# Patient Record
Sex: Female | Born: 1955
Health system: Southern US, Community
[De-identification: ages and names within clinical notes are randomized; demographics above are authoritative.]

## PROBLEM LIST (undated history)

## (undated) DIAGNOSIS — K219 Gastro-esophageal reflux disease without esophagitis: Secondary | ICD-10-CM

## (undated) DIAGNOSIS — R160 Hepatomegaly, not elsewhere classified: Secondary | ICD-10-CM

## (undated) DIAGNOSIS — K5790 Diverticulosis of intestine, part unspecified, without perforation or abscess without bleeding: Secondary | ICD-10-CM

## (undated) DIAGNOSIS — K599 Functional intestinal disorder, unspecified: Secondary | ICD-10-CM

## (undated) DIAGNOSIS — K5730 Diverticulosis of large intestine without perforation or abscess without bleeding: Secondary | ICD-10-CM

## (undated) DIAGNOSIS — K5909 Other constipation: Secondary | ICD-10-CM

## (undated) DIAGNOSIS — S62109A Fracture of unspecified carpal bone, unspecified wrist, initial encounter for closed fracture: Secondary | ICD-10-CM

## (undated) DIAGNOSIS — E669 Obesity, unspecified: Secondary | ICD-10-CM

## (undated) DIAGNOSIS — K635 Polyp of colon: Secondary | ICD-10-CM

## (undated) DIAGNOSIS — K279 Peptic ulcer, site unspecified, unspecified as acute or chronic, without hemorrhage or perforation: Secondary | ICD-10-CM

## (undated) DIAGNOSIS — F32A Depression, unspecified: Secondary | ICD-10-CM

## (undated) DIAGNOSIS — R569 Unspecified convulsions: Secondary | ICD-10-CM

## (undated) DIAGNOSIS — F329 Major depressive disorder, single episode, unspecified: Secondary | ICD-10-CM

## (undated) DIAGNOSIS — H3530 Unspecified macular degeneration: Secondary | ICD-10-CM

## (undated) DIAGNOSIS — K7460 Unspecified cirrhosis of liver: Secondary | ICD-10-CM

## (undated) HISTORY — PX: FOOT SURGERY: SHX648

## (undated) HISTORY — DX: Functional intestinal disorder, unspecified: K59.9

## (undated) HISTORY — PX: GASTRIC BYPASS: SHX52

## (undated) HISTORY — DX: Unspecified macular degeneration: H35.30

## (undated) HISTORY — PX: TONSILLECTOMY: SUR1361

## (undated) HISTORY — DX: Diverticulosis of intestine, part unspecified, without perforation or abscess without bleeding: K57.90

## (undated) HISTORY — DX: Depression, unspecified: F32.A

## (undated) HISTORY — DX: Diverticulosis of large intestine without perforation or abscess without bleeding: K57.30

## (undated) HISTORY — DX: Polyp of colon: K63.5

## (undated) HISTORY — DX: Unspecified cirrhosis of liver: K74.60

## (undated) HISTORY — DX: Fracture of unspecified carpal bone, unspecified wrist, initial encounter for closed fracture: S62.109A

## (undated) HISTORY — DX: Peptic ulcer, site unspecified, unspecified as acute or chronic, without hemorrhage or perforation: K27.9

## (undated) HISTORY — DX: Major depressive disorder, single episode, unspecified: F32.9

## (undated) HISTORY — PX: ABDOMINAL HYSTERECTOMY: SHX81

## (undated) HISTORY — PX: REDUCTION MAMMAPLASTY: SUR839

## (undated) HISTORY — DX: Obesity, unspecified: E66.9

## (undated) HISTORY — DX: Other constipation: K59.09

## (undated) HISTORY — PX: OTHER SURGICAL HISTORY: SHX169

---

## 1986-09-05 DIAGNOSIS — K279 Peptic ulcer, site unspecified, unspecified as acute or chronic, without hemorrhage or perforation: Secondary | ICD-10-CM

## 1986-09-05 HISTORY — DX: Peptic ulcer, site unspecified, unspecified as acute or chronic, without hemorrhage or perforation: K27.9

## 1998-11-03 ENCOUNTER — Encounter: Payer: Self-pay | Admitting: Neurosurgery

## 1998-11-05 ENCOUNTER — Encounter: Payer: Self-pay | Admitting: Neurosurgery

## 1998-11-05 ENCOUNTER — Inpatient Hospital Stay (HOSPITAL_COMMUNITY): Admission: RE | Admit: 1998-11-05 | Discharge: 1998-11-06 | Payer: Self-pay | Admitting: Neurosurgery

## 1999-01-04 ENCOUNTER — Inpatient Hospital Stay (HOSPITAL_COMMUNITY): Admission: AD | Admit: 1999-01-04 | Discharge: 1999-01-08 | Payer: Self-pay | Admitting: Psychiatry

## 1999-02-07 ENCOUNTER — Inpatient Hospital Stay (HOSPITAL_COMMUNITY): Admission: EM | Admit: 1999-02-07 | Discharge: 1999-02-10 | Payer: Self-pay | Admitting: Psychiatry

## 2000-11-29 ENCOUNTER — Encounter: Admission: RE | Admit: 2000-11-29 | Discharge: 2000-11-29 | Payer: Self-pay | Admitting: *Deleted

## 2000-12-14 ENCOUNTER — Encounter (HOSPITAL_COMMUNITY): Admission: RE | Admit: 2000-12-14 | Discharge: 2001-01-13 | Payer: Self-pay | Admitting: Pulmonary Disease

## 2001-01-18 ENCOUNTER — Encounter (HOSPITAL_COMMUNITY): Admission: RE | Admit: 2001-01-18 | Discharge: 2001-02-17 | Payer: Self-pay | Admitting: *Deleted

## 2001-02-19 ENCOUNTER — Encounter (HOSPITAL_COMMUNITY): Admission: RE | Admit: 2001-02-19 | Discharge: 2001-03-21 | Payer: Self-pay | Admitting: *Deleted

## 2001-04-02 ENCOUNTER — Ambulatory Visit (HOSPITAL_COMMUNITY): Admission: RE | Admit: 2001-04-02 | Discharge: 2001-04-02 | Payer: Self-pay | Admitting: Pulmonary Disease

## 2001-06-26 ENCOUNTER — Ambulatory Visit: Admission: RE | Admit: 2001-06-26 | Discharge: 2001-06-27 | Payer: Self-pay | Admitting: Pulmonary Disease

## 2001-11-09 ENCOUNTER — Emergency Department (HOSPITAL_COMMUNITY): Admission: EM | Admit: 2001-11-09 | Discharge: 2001-11-09 | Payer: Self-pay | Admitting: Emergency Medicine

## 2001-12-13 ENCOUNTER — Ambulatory Visit (HOSPITAL_COMMUNITY): Admission: RE | Admit: 2001-12-13 | Discharge: 2001-12-13 | Payer: Self-pay | Admitting: Cardiology

## 2002-01-30 ENCOUNTER — Inpatient Hospital Stay (HOSPITAL_COMMUNITY): Admission: EM | Admit: 2002-01-30 | Discharge: 2002-02-08 | Payer: Self-pay | Admitting: *Deleted

## 2002-03-15 ENCOUNTER — Inpatient Hospital Stay (HOSPITAL_COMMUNITY): Admission: EM | Admit: 2002-03-15 | Discharge: 2002-03-29 | Payer: Self-pay | Admitting: Psychiatry

## 2002-03-19 ENCOUNTER — Encounter: Payer: Self-pay | Admitting: Psychiatry

## 2002-03-31 ENCOUNTER — Encounter: Payer: Self-pay | Admitting: *Deleted

## 2002-04-01 ENCOUNTER — Inpatient Hospital Stay (HOSPITAL_COMMUNITY): Admission: EM | Admit: 2002-04-01 | Discharge: 2002-04-10 | Payer: Self-pay | Admitting: Psychiatry

## 2002-12-10 ENCOUNTER — Ambulatory Visit (HOSPITAL_COMMUNITY): Admission: RE | Admit: 2002-12-10 | Discharge: 2002-12-10 | Payer: Self-pay | Admitting: Podiatry

## 2008-01-17 ENCOUNTER — Ambulatory Visit: Payer: Self-pay | Admitting: Internal Medicine

## 2008-01-31 ENCOUNTER — Ambulatory Visit (HOSPITAL_COMMUNITY): Admission: RE | Admit: 2008-01-31 | Discharge: 2008-01-31 | Payer: Self-pay | Admitting: Internal Medicine

## 2008-01-31 ENCOUNTER — Ambulatory Visit: Payer: Self-pay | Admitting: Internal Medicine

## 2008-01-31 ENCOUNTER — Encounter: Payer: Self-pay | Admitting: Internal Medicine

## 2008-01-31 DIAGNOSIS — K5790 Diverticulosis of intestine, part unspecified, without perforation or abscess without bleeding: Secondary | ICD-10-CM

## 2008-01-31 HISTORY — PX: COLONOSCOPY: SHX174

## 2008-01-31 HISTORY — DX: Diverticulosis of intestine, part unspecified, without perforation or abscess without bleeding: K57.90

## 2008-02-27 ENCOUNTER — Ambulatory Visit: Payer: Self-pay | Admitting: Internal Medicine

## 2008-04-08 DIAGNOSIS — K5900 Constipation, unspecified: Secondary | ICD-10-CM | POA: Insufficient documentation

## 2008-04-08 DIAGNOSIS — F319 Bipolar disorder, unspecified: Secondary | ICD-10-CM | POA: Insufficient documentation

## 2008-04-08 DIAGNOSIS — F329 Major depressive disorder, single episode, unspecified: Secondary | ICD-10-CM | POA: Insufficient documentation

## 2008-04-08 DIAGNOSIS — Z87898 Personal history of other specified conditions: Secondary | ICD-10-CM | POA: Insufficient documentation

## 2008-04-08 DIAGNOSIS — K279 Peptic ulcer, site unspecified, unspecified as acute or chronic, without hemorrhage or perforation: Secondary | ICD-10-CM | POA: Insufficient documentation

## 2008-04-08 DIAGNOSIS — K5730 Diverticulosis of large intestine without perforation or abscess without bleeding: Secondary | ICD-10-CM | POA: Insufficient documentation

## 2008-04-17 ENCOUNTER — Ambulatory Visit: Payer: Self-pay | Admitting: Internal Medicine

## 2009-05-01 ENCOUNTER — Encounter: Payer: Self-pay | Admitting: Urgent Care

## 2009-05-20 ENCOUNTER — Ambulatory Visit: Payer: Self-pay | Admitting: Internal Medicine

## 2010-05-20 ENCOUNTER — Ambulatory Visit: Payer: Self-pay | Admitting: Internal Medicine

## 2010-05-20 ENCOUNTER — Telehealth (INDEPENDENT_AMBULATORY_CARE_PROVIDER_SITE_OTHER): Payer: Self-pay

## 2010-06-04 ENCOUNTER — Emergency Department (HOSPITAL_COMMUNITY): Admission: EM | Admit: 2010-06-04 | Discharge: 2010-06-04 | Payer: Self-pay | Admitting: Emergency Medicine

## 2010-06-14 ENCOUNTER — Encounter (INDEPENDENT_AMBULATORY_CARE_PROVIDER_SITE_OTHER): Payer: Self-pay | Admitting: *Deleted

## 2010-07-31 ENCOUNTER — Inpatient Hospital Stay (HOSPITAL_COMMUNITY)
Admission: EM | Admit: 2010-07-31 | Discharge: 2010-08-09 | Payer: Self-pay | Source: Home / Self Care | Admitting: Emergency Medicine

## 2010-08-04 ENCOUNTER — Ambulatory Visit: Payer: Self-pay | Admitting: Gastroenterology

## 2010-08-05 HISTORY — PX: ERCP W/ SPHINCTEROTOMY AND BALLOON DILATION: SHX1524

## 2010-08-05 HISTORY — PX: CHOLECYSTECTOMY: SHX55

## 2010-08-06 ENCOUNTER — Encounter (INDEPENDENT_AMBULATORY_CARE_PROVIDER_SITE_OTHER): Payer: Self-pay | Admitting: Pulmonary Disease

## 2010-09-20 ENCOUNTER — Encounter (INDEPENDENT_AMBULATORY_CARE_PROVIDER_SITE_OTHER): Payer: Self-pay | Admitting: *Deleted

## 2010-10-07 NOTE — Medication Information (Signed)
Summary: Tax adviser   Imported By: Diana Eves 05/01/2009 16:07:54  _____________________________________________________________________  External Attachment:    Type:   Image     Comment:   External Document  Appended Document: RX FolderAMITIZA    Prescriptions: AMITIZA 24 MCG CAPS (LUBIPROSTONE) one by mouth two times a day for constipation  #60 x 5   Entered and Authorized by:   Joselyn Arrow FNP-BC   Signed by:   Joselyn Arrow FNP-BC on 05/04/2009   Method used:   Electronically to        [facility] (retail)       [address]       [place], [place]  [address]       Ph: [phone]       Fax: [phone]   RxID:   [id number]

## 2010-10-07 NOTE — Assessment & Plan Note (Signed)
Summary: YEAR FU/CONSTIPATION/SS   Visit Type:  Follow-up Visit Primary Care Provider:  Juanetta Gosling  Chief Complaint:  F/U constipation.  History of Present Illness: Followup chronic constipation. One year followup. Patient doing very well on Amitiza. Having one to 2 bowel movements daily. No headache. No nausea. She denies any intercurrent illnesses since she was last seen here. She'll be due for average risk screening colonoscopy 2019.  Weight down 4 pounds since she was here one year ago.   Current Medications (verified): 1)  Wellbutrin .... Once Daily 2)  Trazodone .... Once Daily 3)  Risperidone .... Once Daily 4)  Amitiza 24 Mcg Caps (Lubiprostone) .... One By Mouth Two Times A Day For Constipation 5)  Prozac .... Take 1 Tablet By Mouth Once A Day  Allergies (verified): 1)  ! Cipro  Past History:  Past Medical History: Last updated: 2009-06-18 Depression Bipolar Chronic constipation Diverticulosis Morbid obesity  Past Surgical History: Last updated: 04/08/2008 Tonsillectomy Hysterectomy Gastric Bypass with Revision Wrist Surgery  Family History: Last updated: 06-18-09 Father: Deceased age 28    CVA Mother: Living age 22      Healthy Siblings: one brother    healthy  Social History: Last updated: 06/18/2010 Marital Status: divorced Children: Two Occupation: Disabled Smoked for 30 years one pack per day  Quit x 5 years Alcohol   quit x 9 years No daily exercise Patient is a former smoker.   Risk Factors: Smoking Status: quit (06-18-2009)  Social History: Marital Status: divorced Children: Two Occupation: Disabled Smoked for 30 years one pack per day  Quit x 5 years Alcohol   quit x 9 years No daily exercise Patient is a former smoker.   Vital Signs:  Patient profile:   55 year old female Height:      64 inches Weight:      240.50 pounds BMI:     41.43 Temp:     97.9 degrees F oral Pulse rate:   80 / minute BP sitting:   120 / 90  (left  arm) Cuff size:   large  Vitals Entered By: Cloria Spring LPN (06/18/10 8:29 AM)  Physical Exam  General:  pleasant lady appears in no acute distress. She has a very flat affect. Lungs:  clear to auscultation Heart:  regular rate rhythm without murmur gallop rub Abdomen:  obese. Positive bowel sounds soft nontender without obvious mass or organomegaly  Impression & Recommendations: Impression: Pleasant 55 year old lady with chronic constipation. She's had a fantastic response to Amitizat. She's doing very well on this medication without any apparent side effects.  Recommendations: Continue Amitiza 24 micrograms twice daily during meals. Unless something comes up, will plan to see this nice lady back in one year. Screening colonoscopy 2019.  Appended Document: Orders Update    Clinical Lists Changes  Problems: Added new problem of CONSTIPATION (ICD-564.00) Orders: Added new Service order of Est. Patient Level III (81191) - Signed

## 2010-10-07 NOTE — Miscellaneous (Signed)
Summary: CONSULTATION  Clinical Lists Changes NAME:  [name], [name]             ACCOUNT NO.:  [id number]      MEDICAL RECORD NO.:  [id number]          PATIENT TYPE:  INP      LOCATION:  A316                          FACILITY:  [facility]      PHYSICIAN:  Jonette Eva, M.D.     DATE OF BIRTH:  Aug 09, 1956      DATE OF CONSULTATION:   DATE OF DISCHARGE:                                    CONSULTATION         REASON FOR CONSULTATION:  Nausea, vomiting, and epigastric pain.      HISTORY OF PRESENT ILLNESS:  Ms. [name] is a 55 year old female who   presented to the emergency room with the complaints of nausea, vomiting,   and epigastric pain.  She states this started on Friday of last week.   She reports the epigastric pain is constant, 10/10 when it started,   described as burning and aching.  She also had several episodes of   vomiting and nausea.  She presently states that she only now has   epigastric pain and nausea with eating.  Otherwise she denies pain.  She   denies any vomiting recently.  She does have a history of gastric bypass   in the 100s in [place].  She reports this as a Roux-en-Y, however, she did   undergo some type of possible revision/reversal of her Roux-en-Y due to   a possible perforated ulcer.  This is all per the patient's report.  We   do not have any of these records, so unsure exactly what happened.  She   does state that they oversewed the ulcer, however, as mentioned it is   unclear if this gastric bypass was reversed or just revised. She states   that she normally has a bowel movement every day.  She is on Amitiza at   home, she believes it is 24 mcg.  She has not had a bowel movement since   being admitted, as she has not been on Amitiza.  She avoids aspirin   products, Aleve, and Goody's Powder.      PAST MEDICAL HISTORY:  Significant for depression and constipation.      PAST SURGICAL HISTORY:  Roux-en-Y gastric bypass in [place] in 1983.  Her   preop weight  was 235.  She lost down to 108, however, started to regain   after repair of the ulcer that was done a few years later.  Additional   surgery:  Some type of reparative surgery after she had a perforated   ulcer.  As mentioned before, it is unsure if this was a reversal or just   an oversew of the ulcer.  Hysterectomy, tonsillectomy, breast   augmentation, and wrist surgery.      MEDICATIONS PRIOR TO ADMISSION:  Trazodone, Wellbutrin, Prozac,   Risperdal, and Amitiza.  She believes it might be 24 mcg.      CURRENT MEDICATIONS FOR THE HOSPITAL ADMISSION:  Wellbutrin, Lovenox,   Prozac, Amitiza, Protonix IV, risperidone, trazodone, morphine,   nitroglycerin as needed,  and Zofran 4 mg as needed for nausea.      SOCIAL HISTORY:  She is single and divorced.  She lives with her mom.   She has 2 adult children.  She does admit to alcohol abuse in the past,   drinking approximately a 12-pack daily.  She started drinking when she   was 11 years and just quit 9 years ago.  She has been abstinent ever   since.  She did smoke up until 5 years ago.  She denies any illicit drug   abuse.      FAMILY HISTORY:  Her mom hyperparathyroidism, father diabetes.  She does   have an uncle who has a history of colon cancer.  Denies any other GI or   liver problems in her family.      REVIEW OF SYSTEMS:  Negative except as mentioned in the HPI.      PHYSICAL EXAMINATION:  GENERAL:  She has a flat affect.  She is alert   and oriented x3.   VITAL SIGNS:  BP 113/70, pulse 52, respirations 20, temp 98.2.   LUNGS:  Clear to auscultation bilaterally.   CARDIOVASCULAR:  First and second heart sounds are appreciated.  No   murmurs, rubs, or gallops.   ABDOMEN:  Soft, but obese.  It is nondistended.  It is tender to   palpation mildly in the left upper quadrant and epigastric region.  She   does have bowel sounds.  Unable to appreciate any mass or splenomegaly.   EXTREMITIES:  Without edema.      PERTINENT  LABORATORY DATA FROM THIS ADMISSION:  Include a CBC on   July 31, 2010:  White blood cell count 14.8, hemoglobin and   hematocrit 13.9 and 40.3.  Sodium 141, potassium 4.2, glucose 148, BUN   11, creatinine 1.17.  Total bilirubin 0.4, alk phos was normal at 86,   AST and ALT both 21--these are normal, total protein 7.4, albumin 4.1,   calcium 9.6, lipase 30, which is normal.  Cardiac markers appear to be   normal.      RADIOLOGY:  Acute abdominal series done on July 31, 2010, showed   postsurgical changes of the abdomen, nonspecific bowel gas pattern,   possible atelectasis or scarring of the left lung base.  Ultrasound of   the abdomen done August 03, 2010, secondary to abdominal pain, nausea,   and vomiting, impression was cholelithiasis, mild gallbladder wall   thickening noted, negative Murphy sign, fatty liver.  There was a 3-cm   slightly complex cystic area to the pancreatic head and left lobe of the   liver.  A CT with pancreatic protocol was then performed, which showed   probable lymph node in the gastrohepatic ligament corresponding to the   mass that was seen on the ultrasound.  They recommended a followup in 3   months.  There was another prominent lymph node in the portacaval space   that was 2.4 x 1.4 cm.  Pancreas appeared normal without inflammatory   changes.  There were several stones in the gallbladder with mild wall   thickening. Stones were seen in the common bile duct measuring 7 mm and   6 mm.  There was mild low attenuation seen in the hepatic parenchyma,   which could possibly be seen with cirrhosis, nonspecific nodularity.      IMPRESSION:  Ms. [name] is a 55 year old Caucasian female with a past   surgical history significant for a gastric  bypass, as well as some sort   of possible reversal of the gastric bypass.  It is unclear per her   history.  This was secondary to complications of an ulcer that developed   a few years after the gastric bypass.   She presents with nausea,   vomiting, and epigastric pain, as well as left upper quadrant pain.  CT   performed does show cholelithiasis, as well as some concerning lymph   nodes that will need to be evaluated by possible biopsy per   Interventional Radiology.  She will also need an ERCP (endoscopic   retrograde cholangiopancreatography), however, it is unclear secondary   to her anatomy if this would actually be able to be performed.  She will   most likely have to be referred to another facility to do this.  This CT   will be reviewed with the radiologist by Dr. Darrick Penna to discuss ERCP, as   well as the lymph node status.  There is concern for possible cirrhosis   that was seen on the CT scan, however, a CMP was drawn on July 31, 2010, which has normal results without any elevation of LFTs.      PLAN:   1. Will discuss CT results with the radiologist to determine the steps       for the possible lymph node biopsy, as well as the route we need to       take as far as managing the choledocholithiasis.  Ultimately she       will have to have her gallbladder removed as expressed by General       Surgery as well.   2. We will change her Protonix to p.o.   3. Will restart Amitiza.   4. LFTs.   5. We will continue to follow the patient and follow up on the plan       for the lymph node biopsies and possible ERCP at an outlying       facility.  In the meantime, continue supportive measures.      We would like to thank Dr. Juanetta Gosling for the referral of this nice lady.            ______________________________   Gerrit Halls, ANP-BC      ______________________________   Jonette Eva, M.D.            AS/MEDQ  D:  08/04/2010  T:  08/04/2010  Job:  [id number]      Electronically Signed by Gerrit Halls  on 08/11/2010 11:50:10 AM   Electronically Signed by Jonette Eva M.D. on 09/16/2010 07:10:22 PM

## 2010-10-07 NOTE — Assessment & Plan Note (Signed)
Summary: FU OV 1 YR,CONSTIPATION/AMS   Visit Type:  Follow-up Visit Primary Care [name]:  [name]  Chief Complaint:  F/U constipation.  History of Present Illness:  Followup chronic constipation. This patient has done well on Amitiza 24 micrograms twice daily. She has one to 2 bowel movements on this regimen; otherwise currently is having no GI symptoms. Colonoscopy one year ago demonstrated only a hyperplastic polyp. No family history of first-degree relatives with polyps or colon cancer. She is due for routine screening 2019.   Preventive Screening-Counseling & Management  Alcohol-Tobacco     Smoking Status: quit  Current Medications (verified): 1)  Wellbutrin .... Once Daily 2)  Trazodone .... Once Daily 3)  Risperidone .... Once Daily 4)  Amitiza 24 Mcg Caps (Lubiprostone) .... One By Mouth Two Times A Day For Constipation 5)  Prozac .... Take 1 Tablet By Mouth Once A Day  Allergies (verified): 1)  ! Cipro  Past History:  Past Surgical History: Last updated: 04/08/2008 Tonsillectomy Hysterectomy Gastric Bypass with Revision Wrist Surgery  Past Medical History: Depression Bipolar Chronic constipation Diverticulosis Morbid obesity  Family History: Reviewed history and no changes required. Father: Deceased age 58    CVA Mother: Living age 28      Healthy Siblings: one brother    healthy  Social History: Marital Status: divorced Children: Two Occupation: Disabled Smoked for 30 years one pack per day  Quit x 4 years Alcohol   quit x 8 years No daily exercise Patient is a former smoker.  Smoking Status:  quit  Vital Signs:  Patient profile:   55 year old female Height:      64 inches Weight:      244.50 pounds BMI:     42.12 Temp:     97.8 degrees F oral Pulse rate:   88 / minute BP sitting:   126 / 90  (left arm) Cuff size:   large  Vitals Entered By: Cloria Spring LPN (May 20, 2009 9:48 AM)  Physical Exam  General:  flat affect but no  acute distress Eyes:  no scleral icterus conjunctiva are pink Lungs:  clear to auscultation Heart:  regular rate rhythm without murmur gallop rub Abdomen:  obese positive bowel sounds soft nontender without appreciable mass or organomegaly  Impression & Recommendations: Chronic constipation well controlled now  on Amitiza described above.  Recommendations: Continue Amitiza 24 micrograms twice daily during meals warning and about potential side effects. Unless something comes up and seems nicely back in one year. Plan for screening colonoscopy 2019  Appended Document: Orders Update-charge    Clinical Lists Changes  Orders: Added new Service order of Est. Patient Level III (29562) - Signed

## 2010-10-07 NOTE — Medication Information (Signed)
Summary: AMITIZA  AMITIZA   Imported By: Rexene Alberts 06/14/2010 10:13:23  _____________________________________________________________________  External Attachment:    Type:   Image     Comment:   External Document  Appended Document: AMITIZA called [facility]- Rx from 05/31/2010 was put on file. pt has 1 year RF on [place].

## 2010-10-07 NOTE — Progress Notes (Signed)
Summary: Needs Rx for Amitiza/ forgot to get at [facility]  Phone Note Call from Patient   Caller: Patient Summary of Call: VM from pt, she forgot to get Rx for Amitiza when she was here today. Initial call taken by: Cloria Spring LPN,  May 20, 2010 4:51 PM     Appended Document: Needs Rx for Amitiza/ forgot to get at [facility] ok; amitiza 24 micrograms gelcap one two times a day during meals disp #60 w one year refill  Appended Document: Needs Rx for Amitiza/ forgot to get at [facility] rx called to [facility]

## 2010-11-15 LAB — HEPATIC FUNCTION PANEL
ALT: 58 U/L — ABNORMAL HIGH (ref 0–35)
AST: 35 U/L (ref 0–37)
Albumin: 2.6 g/dL — ABNORMAL LOW (ref 3.5–5.2)
Alkaline Phosphatase: 81 U/L (ref 39–117)
Bilirubin, Direct: 0.3 mg/dL (ref 0.0–0.3)
Indirect Bilirubin: 0.6 mg/dL (ref 0.3–0.9)
Total Bilirubin: 0.9 mg/dL (ref 0.3–1.2)
Total Protein: 5.2 g/dL — ABNORMAL LOW (ref 6.0–8.3)

## 2010-11-15 LAB — CBC
HCT: 34 % — ABNORMAL LOW (ref 36.0–46.0)
Hemoglobin: 11.4 g/dL — ABNORMAL LOW (ref 12.0–15.0)
MCH: 30.1 pg (ref 26.0–34.0)
MCHC: 33.5 g/dL (ref 30.0–36.0)
MCV: 89.7 fL (ref 78.0–100.0)
Platelets: 340 10*3/uL (ref 150–400)
RBC: 3.79 MIL/uL — ABNORMAL LOW (ref 3.87–5.11)
RDW: 13.5 % (ref 11.5–15.5)
WBC: 13.9 10*3/uL — ABNORMAL HIGH (ref 4.0–10.5)

## 2010-11-15 LAB — BASIC METABOLIC PANEL
BUN: 4 mg/dL — ABNORMAL LOW (ref 6–23)
CO2: 27 mEq/L (ref 19–32)
Calcium: 8 mg/dL — ABNORMAL LOW (ref 8.4–10.5)
Chloride: 102 mEq/L (ref 96–112)
Creatinine, Ser: 1.04 mg/dL (ref 0.4–1.2)
GFR calc Af Amer: >60 mL/min (ref >60)
GFR calc non Af Amer: 55 mL/min — ABNORMAL LOW (ref >60)
Glucose, Bld: 121 mg/dL — ABNORMAL HIGH (ref 70–99)
Potassium: 3 mEq/L — ABNORMAL LOW (ref 3.5–5.1)
Sodium: 138 mEq/L (ref 135–145)

## 2010-11-15 LAB — DIFFERENTIAL
Basophils Absolute: 0 10*3/uL (ref 0.0–0.1)
Basophils Relative: 0 % (ref 0–1)
Eosinophils Absolute: 0 10*3/uL (ref 0.0–0.7)
Eosinophils Relative: 0 % (ref 0–5)
Lymphocytes Relative: 12 % (ref 12–46)
Lymphs Abs: 1.7 10*3/uL (ref 0.7–4.0)
Monocytes Absolute: 1.3 10*3/uL — ABNORMAL HIGH (ref 0.1–1.0)
Monocytes Relative: 9 % (ref 3–12)
Neutro Abs: 10.9 10*3/uL — ABNORMAL HIGH (ref 1.7–7.7)
Neutrophils Relative %: 79 % — ABNORMAL HIGH (ref 43–77)

## 2010-11-15 LAB — GLUCOSE, CAPILLARY: Glucose-Capillary: 124 mg/dL — ABNORMAL HIGH (ref 70–99)

## 2010-11-16 LAB — CARDIAC PANEL(CRET KIN+CKTOT+MB+TROPI)
CK, MB: 1.1 ng/mL (ref 0.3–4.0)
CK, MB: 1.1 ng/mL (ref 0.3–4.0)
Relative Index: INVALID (ref 0.0–2.5)
Relative Index: INVALID (ref 0.0–2.5)
Total CK: 65 U/L (ref 7–177)
Total CK: 93 U/L (ref 7–177)
Troponin I: 0.02 ng/mL (ref 0.00–0.06)
Troponin I: <0.01 ng/mL (ref 0.00–0.06)

## 2010-11-16 LAB — URINALYSIS, ROUTINE W REFLEX MICROSCOPIC
Bilirubin Urine: NEGATIVE
Glucose, UA: NEGATIVE mg/dL
Ketones, ur: NEGATIVE mg/dL
Leukocytes, UA: NEGATIVE
Nitrite: NEGATIVE
Protein, ur: NEGATIVE mg/dL
Specific Gravity, Urine: >1.03 — ABNORMAL HIGH (ref 1.005–1.030)
Urobilinogen, UA: 0.2 mg/dL (ref 0.0–1.0)
pH: 5.5 (ref 5.0–8.0)

## 2010-11-16 LAB — CBC
HCT: 40.3 % (ref 36.0–46.0)
Hemoglobin: 13.9 g/dL (ref 12.0–15.0)
MCH: 30.5 pg (ref 26.0–34.0)
MCHC: 34.5 g/dL (ref 30.0–36.0)
MCV: 88.4 fL (ref 78.0–100.0)
Platelets: 333 10*3/uL (ref 150–400)
RBC: 4.56 MIL/uL (ref 3.87–5.11)
RDW: 12.8 % (ref 11.5–15.5)
WBC: 14.8 10*3/uL — ABNORMAL HIGH (ref 4.0–10.5)

## 2010-11-16 LAB — URINE MICROSCOPIC-ADD ON

## 2010-11-16 LAB — URINE CULTURE
Colony Count: NO GROWTH
Culture  Setup Time: 201111261917
Culture: NO GROWTH

## 2010-11-16 LAB — POCT CARDIAC MARKERS
CKMB, poc: 1.3 ng/mL (ref 1.0–8.0)
CKMB, poc: 1.6 ng/mL (ref 1.0–8.0)
Myoglobin, poc: 92.8 ng/mL (ref 12–200)
Myoglobin, poc: 95.7 ng/mL (ref 12–200)
Troponin i, poc: <0.05 ng/mL (ref 0.00–0.09)
Troponin i, poc: <0.05 ng/mL (ref 0.00–0.09)

## 2010-11-16 LAB — PROTIME-INR
INR: 1 (ref 0.00–1.49)
Prothrombin Time: 13.4 seconds (ref 11.6–15.2)

## 2010-11-16 LAB — DIFFERENTIAL
Basophils Absolute: 0 10*3/uL (ref 0.0–0.1)
Basophils Relative: 0 % (ref 0–1)
Eosinophils Absolute: 0 10*3/uL (ref 0.0–0.7)
Eosinophils Relative: 0 % (ref 0–5)
Lymphocytes Relative: 5 % — ABNORMAL LOW (ref 12–46)
Lymphs Abs: 0.8 10*3/uL (ref 0.7–4.0)
Monocytes Absolute: 0.6 10*3/uL (ref 0.1–1.0)
Monocytes Relative: 4 % (ref 3–12)
Neutro Abs: 13.4 10*3/uL — ABNORMAL HIGH (ref 1.7–7.7)
Neutrophils Relative %: 91 % — ABNORMAL HIGH (ref 43–77)

## 2010-11-16 LAB — COMPREHENSIVE METABOLIC PANEL
ALT: 21 U/L (ref 0–35)
AST: 21 U/L (ref 0–37)
Albumin: 4.1 g/dL (ref 3.5–5.2)
Alkaline Phosphatase: 86 U/L (ref 39–117)
BUN: 11 mg/dL (ref 6–23)
CO2: 25 mEq/L (ref 19–32)
Calcium: 9.6 mg/dL (ref 8.4–10.5)
Chloride: 106 mEq/L (ref 96–112)
Creatinine, Ser: 1.17 mg/dL (ref 0.4–1.2)
GFR calc Af Amer: 58 mL/min — ABNORMAL LOW (ref >60)
GFR calc non Af Amer: 48 mL/min — ABNORMAL LOW (ref >60)
Glucose, Bld: 148 mg/dL — ABNORMAL HIGH (ref 70–99)
Potassium: 4.2 mEq/L (ref 3.5–5.1)
Sodium: 141 mEq/L (ref 135–145)
Total Bilirubin: 0.4 mg/dL (ref 0.3–1.2)
Total Protein: 7.4 g/dL (ref 6.0–8.3)

## 2010-11-16 LAB — HEPATIC FUNCTION PANEL
ALT: 80 U/L — ABNORMAL HIGH (ref 0–35)
AST: 23 U/L (ref 0–37)
Albumin: 2.7 g/dL — ABNORMAL LOW (ref 3.5–5.2)
Alkaline Phosphatase: 124 U/L — ABNORMAL HIGH (ref 39–117)
Bilirubin, Direct: 0.5 mg/dL — ABNORMAL HIGH (ref 0.0–0.3)
Indirect Bilirubin: 0.5 mg/dL (ref 0.3–0.9)
Total Bilirubin: 1 mg/dL (ref 0.3–1.2)
Total Protein: 5.6 g/dL — ABNORMAL LOW (ref 6.0–8.3)

## 2010-11-16 LAB — APTT: aPTT: 24 seconds (ref 24–37)

## 2010-11-16 LAB — LIPASE, BLOOD: Lipase: 30 U/L (ref 11–59)

## 2011-01-18 NOTE — Assessment & Plan Note (Signed)
NAME:  [name], [name]              CHART#:  [id number]   DATE:  04/17/2008                       DOB:  Aug 27, 1956   Followup chronic constipation, obstipation, hyperplastic polyp on  colonoscopy 01/31/2008, history of diverticulosis, and other problems as  chronicled in 02/27/2008 office note.  The patient is doing well.  She  is having two bowel movements daily with Amitiza 24 mcg b.i.d. with  breakfast and supper.  No headache, diarrhea, or nausea.  Overall, she  is very happy with bowel frequency.  She is no longer taking MiraLax as  it really did not add anything to efficacy of Amitiza.  She feels well.  Calcium and TSH came back normal at 9.8 and 2.595 respectively.   CURRENT MEDICATIONS:  See updated list.   ALLERGIES:  No known drug allergies.   PHYSICAL EXAMINATION:  GENERAL:  She appears at her baseline.  VITAL SIGNS:  Weight 241, height 5 feet 4 inches, temp 91, BP 110/68,  and pulse 60.  SKIN:  Warm and dry.  ABDOMEN:  Obese.  Positive bowel sounds.  Soft and nontender without  appreciable mass or hepatosplenomegaly.   ASSESSMENT:  Constipation, now markedly improved with Amitiza.  She is  tolerating this agent very well.   RECOMMENDATIONS:  Would continue Amitiza 24 mcg b.i.d. with meals as  described above.  Watch for side effects (she is doing well at this  point, she is likely not to have any in the future).  Unless something  comes up, I plan to see this nice lady back in 1 year.  Amitiza  prescription refill given.       Jonathon Bellows, M.D.  Electronically Signed     RMR/MEDQ  D:  04/17/2008  T:  04/18/2008  Job:  [id number]   cc:   Ramon Dredge L. Juanetta Gosling, M.D.

## 2011-01-18 NOTE — Op Note (Signed)
NAME:  [name], [name]             ACCOUNT NO.:  [id number]   MEDICAL RECORD NO.:  [id number]          PATIENT TYPE:  AMB   LOCATION:  DAY                           FACILITY:  [facility]   PHYSICIAN:  R. Roetta Sessions, M.D. DATE OF BIRTH:  10/25/1955   DATE OF PROCEDURE:  01/31/2008  DATE OF DISCHARGE:                               OPERATIVE REPORT   INDICATIONS FOR PROCEDURE:  This is a 55 year old lady, chronic  constipation, positive family history of colon cancer in secondary  relative.  Last colonoscopy is back in the mid 80s for reasons which  were not clear.  Colonoscopy is now being done.  This approach has been  discussed with the patient at length.  Potential risks, benefits, and  alternatives have been reviewed.  Questions answered.  She is agreeable.  Please see documentation of medical record.   PROCEDURE NOTE:  O2 saturation, blood pressure, pulse, and respirations  were monitored throughout the entire procedure.  Conscious sedation  Versed 4 mg IV, Demerol 100 mg IV in divided doses.   INSTRUMENT:  Pentax video chip system.   FINDINGS:  Digital rectal exam revealed no abnormalities.   ENDOSCOPIC FINDINGS:  Prep was adequate.  Colon:  Colonic mucosa was  surveyed from the rectosigmoid junction through the left transverse,  right colon, the appendiceal orifice, ileocecal valve, and cecum.  These  structures were well seen and photographed for record.  From this level,  scope was slowly cautiously withdrawn.  All previously mentioned mucosal  surfaces were again seen.  The patient had an elongated tortuous colon.  She had scattered left-sided diverticula.  She had three diminutive  polyps clustered at 30 cm which were cold biopsied/removed.  The  remainder of colonic mucosa appeared unremarkable.  Scope was pulled  down the rectum.  The rectal mucosa including retroflex view of anal  verge demonstrated no abnormalities.  The patient tolerated the  procedure well.  It was  reactive endoscopy.   IMPRESSION:  Normal rectum, left-sided diverticula.  Long tortuous  colon, diminutive polyps at 30 cm removed as described above, otherwise  colonic mucosa appeared unremarkable.   Suspect the patient has slow transit constipation (colonic inertia).   RECOMMENDATIONS:  1. We will follow up on path.  Her polyps were removed today.  2. Begin MiraLax 17 g orally at bedtime.  3. We will plan to see her back in one month and assess her progress      and go from there.  She was given literature of constipation and      diverticulosis.      Jonathon Bellows, M.D.  Electronically Signed     RMR/MEDQ  D:  01/31/2008  T:  01/31/2008  Job:  [id number]   cc:   Ramon Dredge L. Juanetta Gosling, M.D.  Fax: [phone]

## 2011-01-18 NOTE — Assessment & Plan Note (Signed)
NAME:  GALADRIEL, SHROFF              CHART#:  [id number]   DATE:  02/27/2008                       DOB:  02/19/1956   PRIMARY CARE PHYSICIAN:  Oneal Deputy. Juanetta Gosling, MD   CHIEF COMPLAINT:  1. Chronic constipation and obstipation.  2. Hyperplastic polyp on colonoscopy 01/31/2008 by Dr. Jena Gauss.  3. Diverticulosis.  4. Morbid obesity.  5. Status post gastric bypass and revision with history of peptic      ulcer disease.  6. Status post hysterectomy.  7. Bipolar disorder.  8. Status post tonsillectomy.  9. Status post wrist surgery.   SUBJECTIVE:  The patient is a 55 year old female with a history of  chronic constipation/obstipation.  She can go up to a week without a  bowel movement.  She has recently tried MiraLax 17 grams daily for about  3 days.  She did not notice any difference.  She tells me her thyroid  has not been checked in years.  Her weight has been relatively stable,  although she is morbidly obese.  She had recent colonoscopy as above.  She has tried previously on Correctol and Dulcolax as well as enemas.  She has never been on fiber supplementation.  She denies any abdominal  pain.   CURRENT MEDICATIONS:  See the list from 02/27/2008.   ALLERGIES:  No known drug allergies.   OBJECTIVE:  VITAL SIGNS:  Weight 246 pounds, height 54 inches,  temperature 98.3, blood pressure 120/82, and pulse 74.  GENERAL:  The patient is a morbidly obese Caucasian female who is pale  appearing.  She is alert, oriented, pleasant, and cooperative in no  acute distress.  HEENT.  Sclerae clear.  Nonicteric.  Conjunctivae pink.  Oropharynx pink  and moist without any lesions.  CHEST:  Heart regular rate and rhythm.  Normal S1 and S2.  ABDOMEN:  Protuberant with positive bowel sounds x4.  No bruits  auscultated.  Soft, nontender, and nondistended without palpable mass or  hepatosplenomegaly.  No rebound, tenderness, or guarding.  EXTREMITIES:  Without clubbing or edema bilaterally.   ASSESSMENT:  The patient is a 55 year old morbidly obese female with  colonic inertia/chronic constipation and history of diverticulosis.   PLAN:  1. Check serum calcium and TSH.  2. Amitiza 24 mcg 1 p.o. b.i.d. p.r.n. constipation with food, #62      with one refill, 2 boxes of samples were given.  3. Benefiber samples were given today to take daily.  4. Office visit in 2 months with Dr. Jena Gauss or sooner if needed.       Lorenza Burton, N.P.  Electronically Signed    ______________________________  Corbin Ade    KJ/MEDQ  D:  02/27/2008  T:  02/28/2008  Job:  [id number]   cc:   Ramon Dredge L. Juanetta Gosling, M.D.

## 2011-01-18 NOTE — H&P (Signed)
NAME:  [name], [name]             ACCOUNT NO.:  [id number]   MEDICAL RECORD NO.:  [id number]           Stout TYPE:  AMB   LOCATION:  DAY                           FACILITY:  [facility]   PHYSICIAN:  R. Roetta Sessions, M.D. DATE OF BIRTH:  [birth year]   DATE OF ADMISSION:  DATE OF DISCHARGE:  LH                              HISTORY & PHYSICAL   REFERRING PHYSICIAN:  Edward L. Juanetta Gosling, M.D.   REASON FOR CONSULTATION:  Constipation, need for colonoscopy.   HISTORY OF PRESENT ILLNESS:  [name] is a pleasant 55-year-  old Caucasian female with bipolar illness, who was seen at Christine request  of Dr. Juanetta Gosling to further evaluate chronic constipation and  consideration of colonoscopy.  [name] has been constipated for  years.  She thinks, things have been seriously worsened and recently she  may go a week or 2 without a bowel movement.  She only gets Christine urge to  have a bowel movement on Christine order of once every 1-2 weeks.  She has not  had any hematochezia.  She rarely takes a laxative.  She goes more than  a week without a bowel movement.  She really has not had any abdominal  pain.  There has been no melena.  She does not have any upper GI tract  symptoms such as odynophagia or dysphagia or early sign of reflux  symptoms of nausea or vomiting.  She has history of morbid obesity and  underwent a gastric bypass surgery by Dr. Elmyra Ricks over Christine good  20 years ago.  She lost 140 pounds and gained it all back.  Apparently,  she had what sounds like an anastomotic ulcer for which she had to have  a surgical revision.  She tells me that she had some problems back in  Christine mid 80s and saw Dr. Hessie Diener and actually had a colonoscopy at that  time without significant findings as records are unavailable.   FAMILY HISTORY:  Significant for one maternal uncle in his 18s and was  diagnosed with colon cancer but no first-degree relatives.   PAST MEDICAL HISTORY:  Significant for bipolar  illness.   PAST SURGERIES:  Tonsillectomy, hysterectomy, gastric bypass with  revision, and wrist surgery.   CURRENT MEDICATIONS:  1. Pristiq daily.  2. Wellbutrin daily.  3. Lithium daily.  4. Trazodone daily.  5. Risperidone daily.   ALLERGIES:  No known drug allergies.   FAMILY HISTORY:  As above.  Mother is alive in good health.  Father died  at age 67 with diabetes mellitus, otherwise as in history of present  illness.   SOCIAL HISTORY:  [name] is divorced and has 2 children.  She does  not work.  No tobacco, no alcohol, and no illicit drugs.   REVIEW OF SYSTEMS:  As above.  No Chest pain or dyspnea on exertion.  No  fever or chills.   PHYSICAL EXAMINATION:  GENERAL:  Morbidly obese 51-year lady resting  comfortably.  VITALS:  Weight 249, height 5 feet 4 inches, temp 98.1, BP 110/80, and  pulse is 64.  SKIN:  Warm and dry.  There is no jaundice.  HEENT:  No scleral icterus.  JVD is not prominent.  Oral cavity, no  lesions.  CHEST:  Lungs are clear to auscultation.  CARDIAC:  Regular rate and rhythm without murmur, gallop, or rub.  BREASTS:  Deferred.  ABDOMEN:  Morbidly obese.  Multiple surgical scars.  Surgical scars are  well healed with positive bowel sounds, soft, nontender without  appreciable mass or organomegaly.  RECTAL:  Deferred to Christine time of colonoscopy.   IMPRESSION:  [name] is a pleasant 51-year lady with a  chronic constipation, which has insidiously worsened over Christine past few  months.  She has not had any recent imaging of her colon.  She has  positive family history in second-degree relatives that sounds more like  she has a colonic inertia issue rather than outlet obstruction issue.   RECOMMENDATIONS:  We will proceed with a colonoscopy in Christine very near  future.  Risks, benefits, alternatives, and limitations have been  reviewed, questions answered.  She is agreeable.  We will make further  recommendations once examination of  colon has been performed.   I would thank Dr. Kari Baars for allowing [place] to see this nice lady  today.      Jonathon Bellows, M.D.  Electronically Signed     RMR/MEDQ  D:  01/17/2008  T:  01/18/2008  Job:  [id number]   cc:   Ramon Dredge L. Juanetta Gosling, M.D.  Fax: [phone]

## 2011-01-21 NOTE — Discharge Summary (Signed)
NAME:  [name], [name] NO.:  [id number]   MEDICAL RECORD NO.:  [id number]                   PATIENT TYPE:  IPS   LOCATION:  0507                                 FACILITY:  [facility]   PHYSICIAN:  Jeanice Lim, MD                DATE OF BIRTH:  1956/02/21   DATE OF ADMISSION:  03/15/2002  DATE OF DISCHARGE:  03/29/2002                                 DISCHARGE SUMMARY   IDENTIFYING DATA:  This is a 55 year old divorced Caucasian female,  voluntarily admitted, with a history of depression and anxiety, increased  stress of her legal issues, experiencing olfactory hallucinations, visual  hallucinations, and suicidal thoughts.   ADMISSION MEDICATIONS:  Flonase, Effexor, Abilify, Neurontin, Gabitril, K-  Lan, and Lactulose.   ALLERGIES:  CIPRO, HYDROCODONE.   PHYSICAL EXAMINATION:  Essentially within normal limits, neurologically  nonfocal.   ROUTINE ADMISSION LABS:  CBC and CMET were within normal limits.  Albumin  was slightly low at 3.4.   MENTAL STATUS EXAM:  Alert, overweight, middle-aged white female,  cooperative with good eye contact.  Speech clear and relevant.  Mood anxious  and depressed, affect tearful, depressed, fidgety, with psychomotor  restlessness.  Thought processes mostly goal directed.  Thought content  positive for olfactory hallucinations and reporting visual hallucinations  and positive paranoid ideation.  Cognitively intact.  Memory and judgment  fair.  Insight fair.   ADMISSION DIAGNOSES:   AXIS I:  Bipolar disorder, depressed stage.   AXIS II:  None.   AXIS III:  None.   AXIS IV:  Problems with legal system and other psychosocial issues.   AXIS V:  30/65.   HOSPITAL COURSE:  The patient was admitted and ordered routine p.r.n.  medications, underwent further monitoring, and was encouraged to participate  in individual, group and milieu therapy.  The patient was restarted on  Neurontin, Gabitril, Abilify, Effexor,  trazodone, Flonase, verapamil,  Lactulose and Cogentin and Ativan p.r.n. akathisia.  Effexor was gradually  tapered and Abilify and Neurontin tapered due to lack of response.  Trileptal was titrated to stabilize mood and Abilify adjusted.  Klonopin was  eventually added for nonresponsive akathisia or restlessness of her legs,  and the patient reported a positive response to clinical intervention.   CONDITION ON DISCHARGE:  Improved.  Mood was more stable and euthymic,  affect brighter, thought process goal directed.  Thought content negative  for dangerous ideation.  The patient reported motivation to be compliant  with a followup plan and to take medications as prescribed.   DISCHARGE MEDICATIONS:  1. Claritin 10 mg q.a.m.  2. Vistaril 50 mg b.i.d.  3. Seroquel 25 mg t.i.d.  4. Trileptal 300 mg t.i.d.  5. Abilify 10 mg q.6 p.m.  6. Klonopin 1 mg q.6 p.m.  7. Gabitril 4 mg 1/2 q.i.d.  8. Trazodone 100 mg q.h.s.  9. Flonase nasal spray, 1 in the morning.  10.      Verapamil 120 mg SR q.a.m.  11.      Lactulose 30 ml q.a.m.   DISPOSITION:  The patient was to follow up at [facility] at 9:30 a.m. on July 28.   DISCHARGE DIAGNOSES:   AXIS I:  Bipolar disorder, depressed stage.   AXIS II:  None.   AXIS III:  None.   AXIS IV:  Problems with legal system and other psychosocial issues.   AXIS V:  Global assessment of function on discharge was 55.                                                Jeanice Lim, MD    JEM/MEDQ  D:  05/07/2002  T:  05/07/2002  Job:  [id number]

## 2011-01-21 NOTE — Op Note (Signed)
NAME:  [name], [name]                       ACCOUNT NO.:  [id number]   MEDICAL RECORD NO.:  [id number]                   PATIENT TYPE:  AMB   LOCATION:  DAY                                  FACILITY:  [facility]   PHYSICIAN:  Cody M. Ulice Brilliant, D.P.M.               DATE OF BIRTH:  [birth year]   DATE OF PROCEDURE:  DATE OF DISCHARGE:                                 OPERATIVE REPORT   PREOPERATIVE DIAGNOSES:  1. Plantar-flexed fourth metatarsal, left foot.  2. Intractable plantar keratoma subfourth metatarsal left foot.   POSTOPERATIVE DIAGNOSES:  1. Plantar-flexed fourth metatarsal, left foot.  2. Intractable plantar keratoma subfourth metatarsal left foot.   PROCEDURES PREFORMED:  1. Elevating metatarsal osteotomy fourth metatarsal left foot.  2. Curettage and excision of intractable plantar keratoma fourth metatarsal     left foot.   SURGEON:  Denny Peon. Ulice Brilliant, D.P.M.   ANESTHESIA:  MAC.   INDICATIONS FOR SURGERY:  Painful longstanding plantar skin lesions  secondary to plantar-flexed fourth metatarsal left foot.  The patient has  been treated conservatively in the office with debridement and excision  which has recurred and become painful.  The patient has requested surgical  procedure.  Clinically noted to have a plantar-flexed fourth metatarsal of  the left foot.   DESCRIPTION OF PROCEDURE:  The patient is brought into the OR and placed on  a table in a supine position.  IV sedation is established.  A local block is  preformed proximal to the fourth metatarsal with a 1:1 mixture of 0.5%  Marcaine and 2% Lidocaine.  A pneumatic ankle tourniquet has been applied  across her left ankle.  Her foot is prepped and draped in the usual aseptic  fashion.  An Ace bandage is then utilized to exsanguinate her foot.  The  tourniquet is inflated to 250 millimeters of mercury.   PROCEDURE 1:  Elevating metatarsal osteotomy of fourth metatarsal left foot.  Attention is directed to the  fourth  metatarsal dorsally.  A 4 cm dorsal  linear skin incision is made.  The incision is deepened via blunt dissection  through subcutaneous tissues.  The extensor digitorum longus tendon of the  fourth toe is retracted laterally.  A capsule and periosteal incision is  made about the fourth metatarsal delivering the metatarsal head and shaft  into the surgical wound.  Care is taken that there is good retraction  exposure with this preformed.  A dorsal distal to plantar proximal osteotomy  is preformed.  A second wedge of bone is then removed and with the wedge of  this having a base which is dorsal and an apex plantar.  The resultant wedge  of bone is removed.  The result is that the fourth metatarsal head is  dorsiflexed creating much less weightbearing pressure on the weightbearing  surface.  This has been fixated via 2.0 x 10 mm self capping cortical screw  fixation.  The osteotomy is deemed stable.  The osteotomy is visualized post  fixation with the intraoperative fluoroscope.  The wound is flushed and the  deep fascia is closed around the incision with 4-0 Vicryl.  Subcutaneous  layers and skin are closed with a running horizontal mattress suture of 4-0  Vicryl and a skin subcuticular suture 4-0 Vicryl.   PROCEDURE 2:  Attention is then directed plantar direction. The plantar  lesion is shaved of excessive hyperkeratotic tissue and the core of this is  excised utilizing a small curette.  The incision is injected postoperatively  with Marcaine and Hexadrol.  Each surgical area is then dressed with a  Betadine soaked Adaptic dressing and a dry sterile compressive dressing  follows.  The tourniquet is deflated with tourniquet time spanning 39  minutes.   DISPOSITION:  The patient is placed into a Cam Walker post-surgically.  She  is transported to day hospital, while there a list of written instructions  are explained to her and her family.  She will be seen within eight days for this  first post-op visit.  A  prescription for Lorcet Plus is dispensed.                                               Denny Peon. Ulice Brilliant, D.P.M.    CMD/MEDQ  D:  12/11/2002  T:  12/11/2002  Job:  [id number]
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
## 2011-01-21 NOTE — Discharge Summary (Signed)
NAME:  [name], [name]                       ACCOUNT NO.:  [id number]   MEDICAL RECORD NO.:  [id number]                  PATIENT TYPE:  IPS   LOCATION:  0302                                 [facility]   PHYSICIAN:  Jeanice Lim, MD                DATE OF BIRTH:  [birth year]   DATE OF ADMISSION:  04/01/2002  DATE OF DISCHARGE:  04/10/2002                                 DISCHARGE SUMMARY   IDENTIFYING DATA:  This is a 55 year old divorced Caucasian female  presenting with increasing depression.  She reports experiencing panic  attacks, feeling herself as Licensed conveyancer, and having paranoid ideation and some  delusions.   MEDICATIONS:  1. Claritin.  2. Levaquin.  3. Vistaril.  4. Seroquel.  5. Trileptal.  6. Abilify.  7. Klonopin.  8. Flonase.  9. Verapamil.  10.      Gabitril.  11.      Trazodone.  12.      Lactulose.   DRUG ALLERGIES:  CIPRO and HYDROCODONE.   PHYSICAL EXAMINATION:  GENERAL: Essentially within normal limits.  NEUROLOGIC: Nonfocal.   LABORATORY DATA:  Routine admission labs were essentially within normal  limits.  @@   MENTAL STATUS EXAM:  Alert, overweight, middle-aged female.  Speech was  clear.  Mood: Depressed and anxious.  Affect: Anxious, somewhat labile.  Thought process: Mostly goal directed.  Thought content: Endorsing positive  paranoid ideation, delusions regarding feeling the government was making her  sick.  No suicidal or homicidal ideation.  Cognitive: Intact.  Judgment and  insight: Poor.   ADMISSION DIAGNOSES:   AXIS I:  Bipolar disorder, depressed.   AXIS II:  None.   AXIS III:  1. Bronchitis.  2. Degenerative joint disease.   AXIS IV:  Moderate: Limited support system.   AXIS V:  [id number]   HOSPITAL COURSE:  The patient was admitted and ordered routine p.r.n.  medications, underwent further monitoring.  She was encouraged to  participate in individual, group, and milieu therapy.  She was restarted on  psychotropics and  medical medications.  Trileptal was tapered and  discontinued, Gabitril tapered and discontinued, Abilify discontinued in  order to simplify medications due to lack of efficacy on the previous  medication regimen.  Lithium was optimized and Risperdal optimized,  Wellbutrin added for depressive component.  The patient reported a positive  response to medication changes, began to participate more in groups, and  reported resolution of delusional thoughts.   CONDITION ON DISCHARGE:  Condition on discharge: Markedly improved.  Mood  was more euthymic.  Affect: Stable.  Thought processes: Goal directed.  Thought content: Negative for dangerous ideation or psychotic symptoms.  The  patient reported motivation to be compliant with followup plan.   DISCHARGE MEDICATIONS:  1. Wellbutrin SR 100 mg q.a.m. and 2 p.m.  2. Klonopin 0.5 mg q.h.s.  3. Trazodone 100 mg two q.h.s.  4. Eskalith CR 450 mg  two q.h.s.  5. Risperdal 1 mg q.h.s.  6. Claritin 10 mg q.a.m.  7. Vistaril 50 mg b.i.d.  8. Verapamil SR 120 mg q.a.m.  9. Lactulose 30 mL  q.a.m.  10.      Cogentin 1 mg q.12h. p.r.n. EPS.   FOLLOW UP:  [facility] on August 11 at 11:30.   DISCHARGE DIAGNOSES:   AXIS I:  Bipolar disorder, depressed.   AXIS II:  None.   AXIS III:  1. Bronchitis.  2. Degenerative joint disease.   AXIS IV:  Moderate: Limited support system.   AXIS V:  Global assessment of functioning on discharge was 50-55.                                                Jeanice Lim, MD    JEM/MEDQ  D:  05/08/2002  T:  05/09/2002  Job:  [id number]
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
## 2011-01-21 NOTE — Discharge Summary (Signed)
[facility]  Patient:    [name] Visit Number: [id number] MRN: [id number]          Service Type: PSY Location: 500 0507 02 Attending Physician:  Jeanice Lim Dictated by:   Jeanice Lim, M.D. Admit Date:  03/15/2002 Disc. Date: 02/08/02                             Discharge Summary  IDENTIFYING DATA:  This is a 55 year old divorced Caucasian female voluntarily admitted for depression and suicidal thoughts.  The patient has been followed by Dr. Betti Cruz as an outpatient, history of bipolar disorder.  Multiple previous psychiatric admissions.  ADMISSION MEDICATIONS: 1. Effexor. 2. Trazodone. 3. Strattera.  ALLERGIES:  CIPRO, KIWI FRUITS.  PHYSICAL EXAMINATION:  GENERAL:  Essentially within normal limits.  ROUTINE ADMISSION LABORATORY DATA:  Alcohol level 7.  Urine drug screen: Positive for THC.  MENTAL STATUS EXAMINATION:  Alert, overweight, middle-aged female, casually dressed with good eye contact.  Speech: Clear.  Mood: Depressed.  Affect: Tearful and angry.  Thought process: Goal directed.  Thought content: Passive suicidality but contracting for safety; no psychotic symptoms.  Cognitive: Intact.  Judgment and insight: Poor.  ADMITTING DIAGNOSES: Axis I:    Bipolar disorder. Axis II:   None. Axis III:  Numbness of fifth and first fingers. Axis IV:   Limited support system, moderate. Axis V:    30/55  HOSPITAL COURSE:  The patient was admitted and ordered routine p.r.n. medications, resumed on Effexor 300 mg q.a.m., gabitril 4 mg q.i.d., trazodone 100 mg q.h.s., and Abilify was started targeting paranoid ideations and mood lability.  Effexor was changed to 150 mg b.i.d. to decrease side effects and Abilify optimized to 15 mg.  Neurontin was also started and optimized, Gabitril was gradually tapered due to lack of efficacy.  The patient reported positive response to medication changes.  CONDITION AT DISCHARGE:   Improved.  Mood: Less depressed and less dysphoric. Affect: Less labile.  Thought process: Goal directed.  Thought content: Negative for dangerous ideation or psychotic symptoms.  The patient reported motivation to be compliant with the followup plan.  DISCHARGE MEDICATIONS: 1. Trazodone 100 mg q.h.s. 2. Flonase two sprays q.a.m. 3. Colace 100 mg b.i.d. 4. Effexor XR 150 mg b.i.d. 5. Abilify 15 mg q.h.s. 6. Gabitril 2 mg q.i.d. 7. Neurontin 400 mg t.i.d. 8. Miralax powder 17 g in water at 6 p.m.  FOLLOWUP:  [facility]  DISCHARGE DIAGNOSES: Axis I:    Bipolar disorder. Axis II:   None. Axis III:  Numbness of fifth and first fingers. Axis IV:   Limited support system, moderate. Axis V:    Global assessment of functioning on discharge was 55. Dictated by:   Jeanice Lim, M.D. Attending Physician:  Jeanice Lim DD:  03/27/02 TD:  03/28/02 Job: [id number]

## 2011-01-21 NOTE — H&P (Signed)
[facility]  Patient:    [name], [name] Visit Number: [id number] MRN: [id number]          Service Type: PSY Location: 400 0403 01 Attending Physician:  Denny Peon Dictated by:   Candi Leash. Orsini, N.P. Admit Date:  01/30/2002                     Psychiatric Admission Assessment  IDENTIFYING INFORMATION:  This is a 55 year old divorced white female voluntarily admitted for depression and suicidal thoughts on Jan 30, 2002.  HISTORY OF PRESENT ILLNESS:  The patient presents with history of bipolar disorder.  The patient has been depressed with varied suicidal plans, unable to contract.  Reports things have been building up for the past three weeks, culminating in a recent jail sentence for DWI and contempt of court.  The patient states, while she was in jail, she cut herself with a plastic fork. She feels very betrayed by her friends.  States she has "noone to trust."  She reports passive suicidal thoughts today and quoting "nobody will let me die." The patient reports she relapsed on alcohol last week after being sober for two years.  She states she is unsure if she used any drugs at that time.  She reports some past auditory hallucinations, hearing someone call her name and states she has been smelling odors.  The patient reports she has been sleeping fairly well.  She reports decreased appetite with a 20-pound weight loss. Currently reporting passive suicidal ideation but will contract for safety and denies any homicidal ideation.  PAST PSYCHIATRIC HISTORY:  She sees Dr. Betti Cruz as an outpatient.  Last visit was one month ago.  She has a history of bipolar disorder.  First hospitalization to [facility].  Has been hospitalized prior at [facility], [facility], [facility].  Her last hospitalization was 2-1/2 years ago at [facility] where she overdosed on Vistaril and Prozac.  She has a history of detox several times.  Last time was  2-1/2 years ago.  In 1993, patient has a history of cutting her wrist.  Her longest history of sobriety has been 2-1/2 years.  SOCIAL HISTORY:  She is a 55 year old divorced white female.  First marriage. Divorced for 20 years.  Has two adult children.  She lives with her mother. She is disabled.  She has criminal charges pending and DWI and contempt of court.  FAMILY HISTORY:  Father with "a nervous breakdown," questionable bipolar disorder.  ALCOHOL/DRUG HISTORY:  The patient stopped smoking last week.  She reports she relapsed on alcohol last Tuesday, drinking three beers.  She has a history of blackouts with no seizures.  Last drink was last Wednesday and believes she may have smoked some marijuana in the past.  Urine drug screen was positive for THC.  PRIMARY CARE [facility]:  Dr. Juanetta Gosling in [place].  MEDICAL PROBLEMS:  The patient reports current numbness in her right hand in the fifth finger and thumb.  MEDICATIONS:  Effexor XR 300 mg every a.m. (has been off that for one week), Gabitril 4 mg q.i.d. (has been on that for one year)(states she is on that for a mood stabilizer), has been on trazodone 100 mg q.h.s. and Strattera for ADD (has been on that for two weeks).  DRUG ALLERGIES:  CIPRO and KIWI fruits.  PHYSICAL EXAMINATION:  Performed at [facility].  The patient currently has bruises to her left upper arm, right elbow area and abrasion to  her lower left arm.  LABORATORY DATA:  Hemoglobin 17, hematocrit 51, glucose 168.  Urine drug screen was positive for THC.  Alcohol level 7.  MENTAL STATUS EXAMINATION:  She is an alert, overweight, middle-aged female, casually dressed with good eye contact.  Speech is clear.  Mood is depressed. Affect is tearful and angry.  Thought processes are coherent, goal directed, passive suicidal ideation but will contract for safety.  No homicidal ideation.  Cognitive function intact.  Judgment and insight poor.  Poor impulse  control.  DIAGNOSES: Axis I:    Bipolar disorder. Axis II:   Deferred. Axis III:  Numbness to fifth and first finger, right hand per history. Axis IV:   Problems with primary support group, legal system and crime, other            psychosocial problems. Axis V:    Current 30; this past year 10.  PLAN:  Voluntary admission for depression and suicidal ideation.  Contract for safety.  Check every 15 minutes.  The patient informed no harm to herself and to notify staff of suicidal ideation.  The patient will resume her Effexor, trazodone and Gabitril.  Will check a fasting blood sugar in the morning and a thyroid level.  We want to stabilize her mood and thinking so patient can be safe, to be medication-compliant, to follow up with Dr. Betti Cruz.  TENTATIVE LENGTH OF STAY:  Four to six days. Dictated by:   Candi Leash. Orsini, N.P. Attending Physician:  Denny Peon DD:  01/31/02 TD:  02/02/02 Job: [id number]

## 2011-01-21 NOTE — Procedures (Signed)
[facility]  Patient:    [name], [name] Visit Number: [id number] MRN: [id number]          Service Type: OUT Location: RAD Attending Physician:  Cain Sieve Dictated by:   [place] Bing, M.D. Proc. Date: 12/13/01 Admit Date:  12/13/2001   CC:         Kari Baars, M.D.   Stress Test  DATE OF BIRTH:  [birth year]  BRIEF HISTORY:  [name] is a pleasant 55 year old female with a history of having taken Redux and Fen-Phen.  She has a history of mitral valve prolapse. She had an echocardiogram performed in March 2002.  She was seen recently in the office and scheduled for a stress echocardiogram to further evaluate for possible cardiac disease.  She has been experiencing some chest tightness and dyspnea.  Prior to the study the patient felt fine.  Her baseline EKG showed sinus rhythm, rate 68 beats per minute.  There were some small inferior Q-waves that were probably not significant.  She had a T-wave inversion in V2 and aVL.  She had slight ST elevation inferior and laterally.  The patients target heart rate was 148 beats per minute.  Her resting blood pressure was 110/70.  She was able to exercise for five minutes and 42 seconds reaching a maximum heart rate of 146 beats per minute.  The test was concluded secondary to severe fatigue.  The patient did not have any chest pain.  She did have significant shortness of breath.  The echocardiogram images were obtained and are pending at time of this dictation. Dictated by:   [place] Bing, M.D. Attending Physician:  Cain Sieve DD:  12/13/01 TD:  12/13/01 Job: [id number]

## 2011-05-20 ENCOUNTER — Encounter: Payer: Self-pay | Admitting: Internal Medicine

## 2011-06-06 ENCOUNTER — Other Ambulatory Visit: Payer: Self-pay | Admitting: Internal Medicine

## 2011-06-07 NOTE — Telephone Encounter (Signed)
Pt is aware of OV for 10/8 at 1030 with KJ

## 2011-06-07 NOTE — Telephone Encounter (Signed)
Pt needs OV 1 yr FU [id number]

## 2011-06-13 ENCOUNTER — Ambulatory Visit (INDEPENDENT_AMBULATORY_CARE_PROVIDER_SITE_OTHER): Payer: PRIVATE HEALTH INSURANCE | Admitting: Urgent Care

## 2011-06-13 ENCOUNTER — Encounter: Payer: Self-pay | Admitting: Urgent Care

## 2011-06-13 VITALS — BP 110/70 | HR 75 | Temp 98.2°F | Ht 64.0 in | Wt 240.6 lb

## 2011-06-13 DIAGNOSIS — K5900 Constipation, unspecified: Secondary | ICD-10-CM

## 2011-06-13 MED ORDER — LUBIPROSTONE 24 MCG PO CAPS: 24.0000 ug | ORAL_CAPSULE | Freq: Two times a day (BID) | ORAL | Status: DC

## 2011-06-13 NOTE — Progress Notes (Signed)
Referring Provider: Fredirick Maudlin, MD Primary Care Physician:  Fredirick Maudlin, MD Primary Gastroenterologist:  Dr. Jena Gauss  Chief Complaint  Patient presents with  . Follow-up    doing good    HPI:  [name] is a 55 y.o. female here for follow up for chronic constipation/colonic inertia.  On Amitiza BID, not skipping doses.  BM twice per day.  Denies abd pain, nausea, vomiting.  Denies rectal bleeding or melena.  Appetite ok.  Heartburn once every 3 months.  Only w/ certain foods like tomato-based.  Wt  Stable.  Feeling fine.  Past Medical History  Diagnosis Date  . Depression   . Bipolar 1 disorder   . Chronic constipation   . Diverticulosis 01/31/2008    colonoscopy Dr Jena Gauss  . Obesity   . Schizoaffective disorder     [facility]ental [facility]-Dr Duayne Cal  . PUD (peptic ulcer disease) 1988  . Colonic inertia     Past Surgical History  Procedure Date  . Tonsillectomy   . S/p hysterectomy     partial  . Gastric bypass     with Revision  . Wrist surgery   . Foot surgery     left  . Cholecystectomy 08/2010    cholelithiasis; Dr Lovell Sheehan    Current Outpatient Prescriptions  Medication Sig Dispense Refill  . buPROPion (WELLBUTRIN) 75 MG tablet Take 75 mg by mouth 2 (two) times daily.        Marland Kitchen FLUoxetine HCl (PROZAC PO) Take 40 mg by mouth daily.       Marland Kitchen lubiprostone (AMITIZA) 24 MCG capsule Take 1 capsule (24 mcg total) by mouth 2 (two) times daily with a meal.  62 capsule  5  . risperiDONE (RISPERDAL) 1 MG tablet Take 4 mg by mouth daily.       . traZODone (DESYREL) 100 MG tablet Take 100 mg by mouth at bedtime.          Allergies as of 06/13/2011 - Review Complete 06/13/2011  Allergen Reaction Noted  . Ciprofloxacin Rash     Family History  Problem Relation Age of Onset  . Colon cancer Maternal Uncle 49  . Diabetes Father     History   Social History  . Marital Status: Divorced    Spouse Name: N/A    Number of Children: 2  . Years of Education: N/A    Occupational History  . disabled    Social History Main Topics  . Smoking status: Former Smoker -- 1.0 packs/day for 30 years    Types: Cigarettes    Quit date: 09/05/2004  . Smokeless tobacco: Former Neurosurgeon  . Alcohol Use: No  . Drug Use: No  . Sexually Active: Not on file   Other Topics Concern  . Not on file   Social History Narrative   Lives w/ mother2 grown children    Review of Systems: Gen: Denies any fever, chills, sweats, anorexia, fatigue, weakness, malaise, weight loss, and sleep disorder CV: Denies chest pain, angina, palpitations, syncope, orthopnea, PND, peripheral edema, and claudication. Resp: Denies dyspnea at rest, dyspnea with exercise, cough, sputum, wheezing, coughing up blood, and pleurisy. GI: Denies vomiting blood, jaundice, and fecal incontinence.   Denies dysphagia or odynophagia. Derm: Denies rash, itching, dry skin, hives, moles, warts, or unhealing ulcers.  Psych: Denies depression, anxiety, memory loss, suicidal ideation, hallucinations, paranoia, and confusion. Heme: Denies bruising, bleeding, and enlarged lymph nodes.  Physical Exam: BP 110/70  Pulse 75  Temp(Src) 98.2 F (36.8  C) (Temporal)  Ht 5\' 4"  (1.626 m)  Wt 240 lb 9.6 oz (109.135 kg)  BMI 41.30 kg/m2 General:   Alert,  Well-developed, well-nourished, pleasant and cooperative in NAD Head:  Normocephalic and atraumatic. Eyes:  Sclera clear, no icterus.   Conjunctiva pink. Mouth:  No deformity or lesions,OP pink/moist. Neck:  Supple; no masses or thyromegaly. Heart:  Regular rate and rhythm; no murmurs, clicks, rubs,  or gallops. Abdomen:  Soft, nontender and nondistended. No masses, hepatosplenomegaly or hernias noted. Normal bowel sounds, without guarding, and without rebound.   Msk:  Symmetrical without gross deformities. Normal posture. Pulses:  Normal pulses noted. Extremities:  Without clubbing or edema. Neurologic:  Alert and  oriented x4;  grossly normal  neurologically. Skin:  Intact without significant lesions or rashes. Cervical Nodes:  No significant cervical adenopathy. Psych:  Alert and cooperative. Normal mood and affect.

## 2011-06-13 NOTE — Patient Instructions (Addendum)
Next colonoscopy 01/2018 Continue amitiza twice per day w/ food for constipation.

## 2011-06-15 ENCOUNTER — Encounter: Payer: Self-pay | Admitting: Urgent Care

## 2011-06-15 NOTE — Assessment & Plan Note (Signed)
Chronic.  Colonic inertia.  Doing well on bid amitiza .  Next colonoscopy 01/2018 Continue amitiza twice per day w/ food for constipation.

## 2011-06-15 NOTE — Progress Notes (Signed)
Cc to PCP 

## 2011-10-13 IMAGING — CR DG UGI W/ GASTROGRAFIN
2 series · 2 of 2 positions shown · IV contrast (agent unspecified)
Comparison: CT 08/03/2010.

CLINICAL DATA: History of nausea and vomiting.  History of dilated
stomach.  History of previous gastric bypass with possible
reversal.History of epigastric pain.

WATER SOLUBLE UPPER GI SERIES
TECHNIQUE: Single-column upper GI series was performed using water
soluble contrast.
Fluoroscopy Time: 1.4 minutes.
Contrast: Gastrographin.

[view not recorded (1 of 2)]
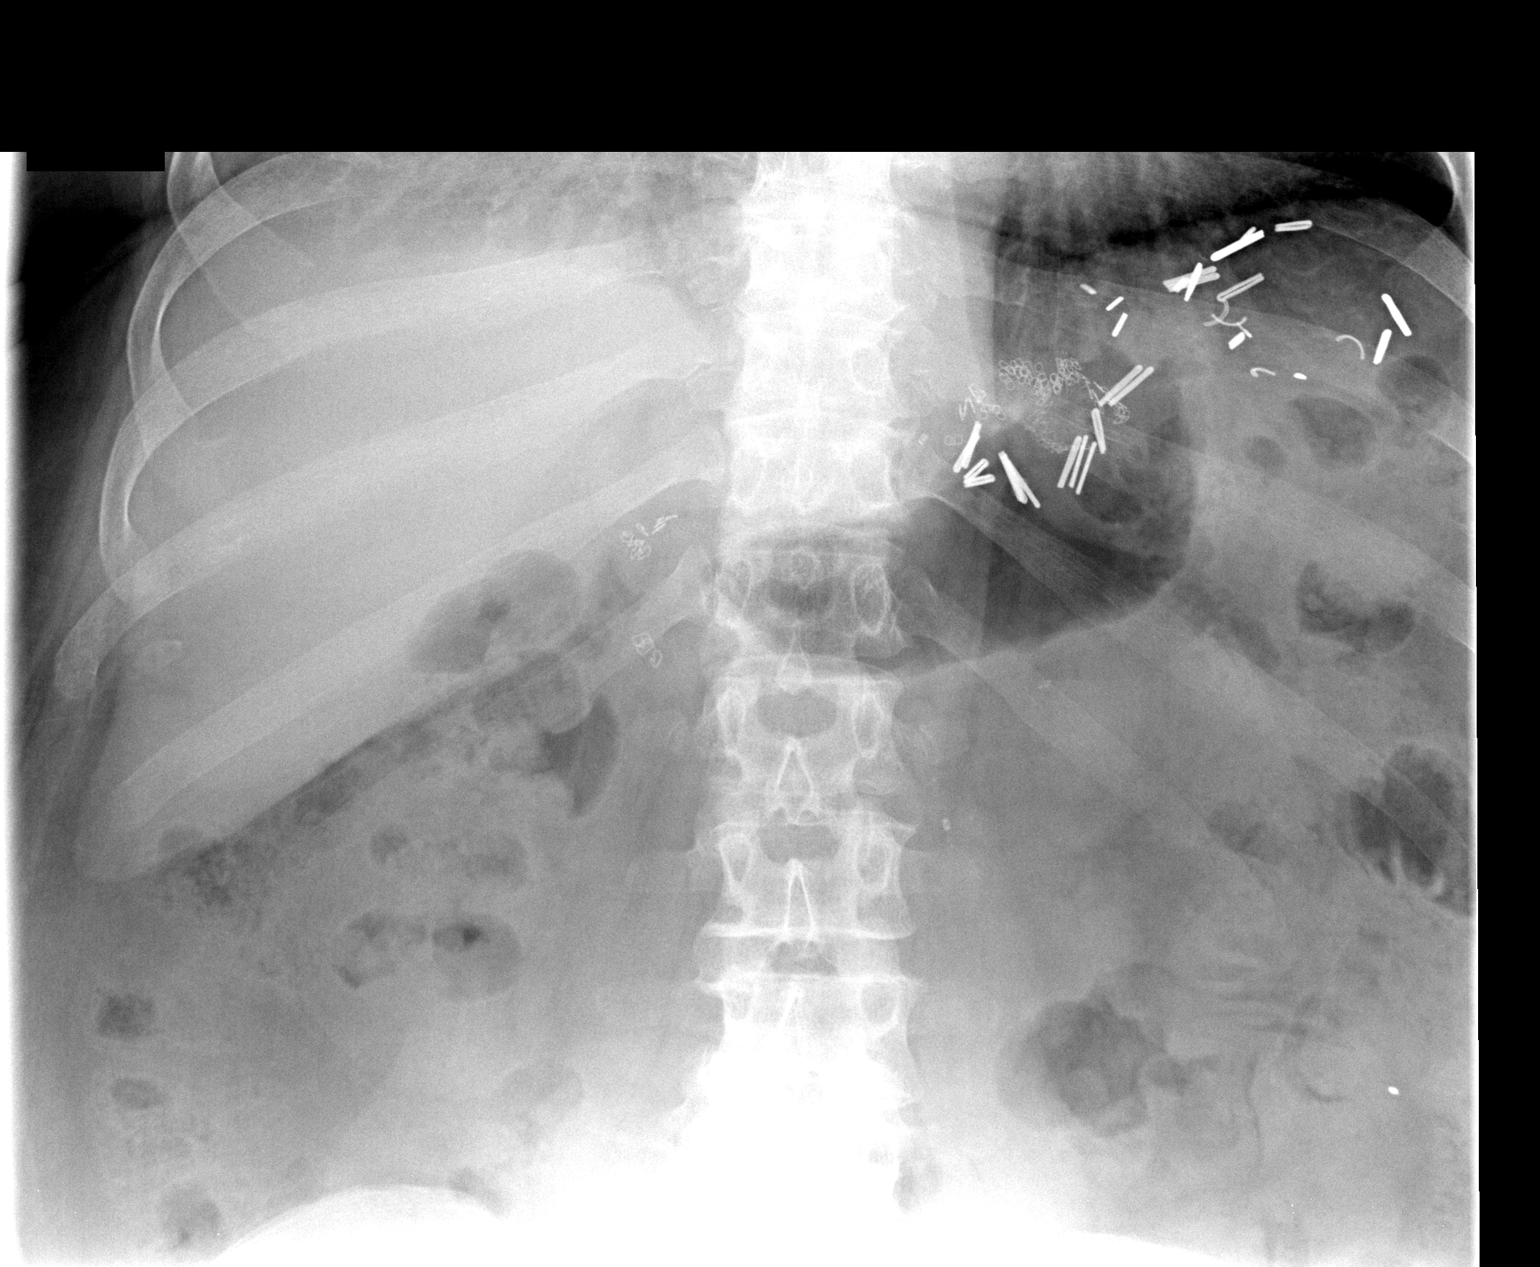

[view not recorded (2 of 2)]
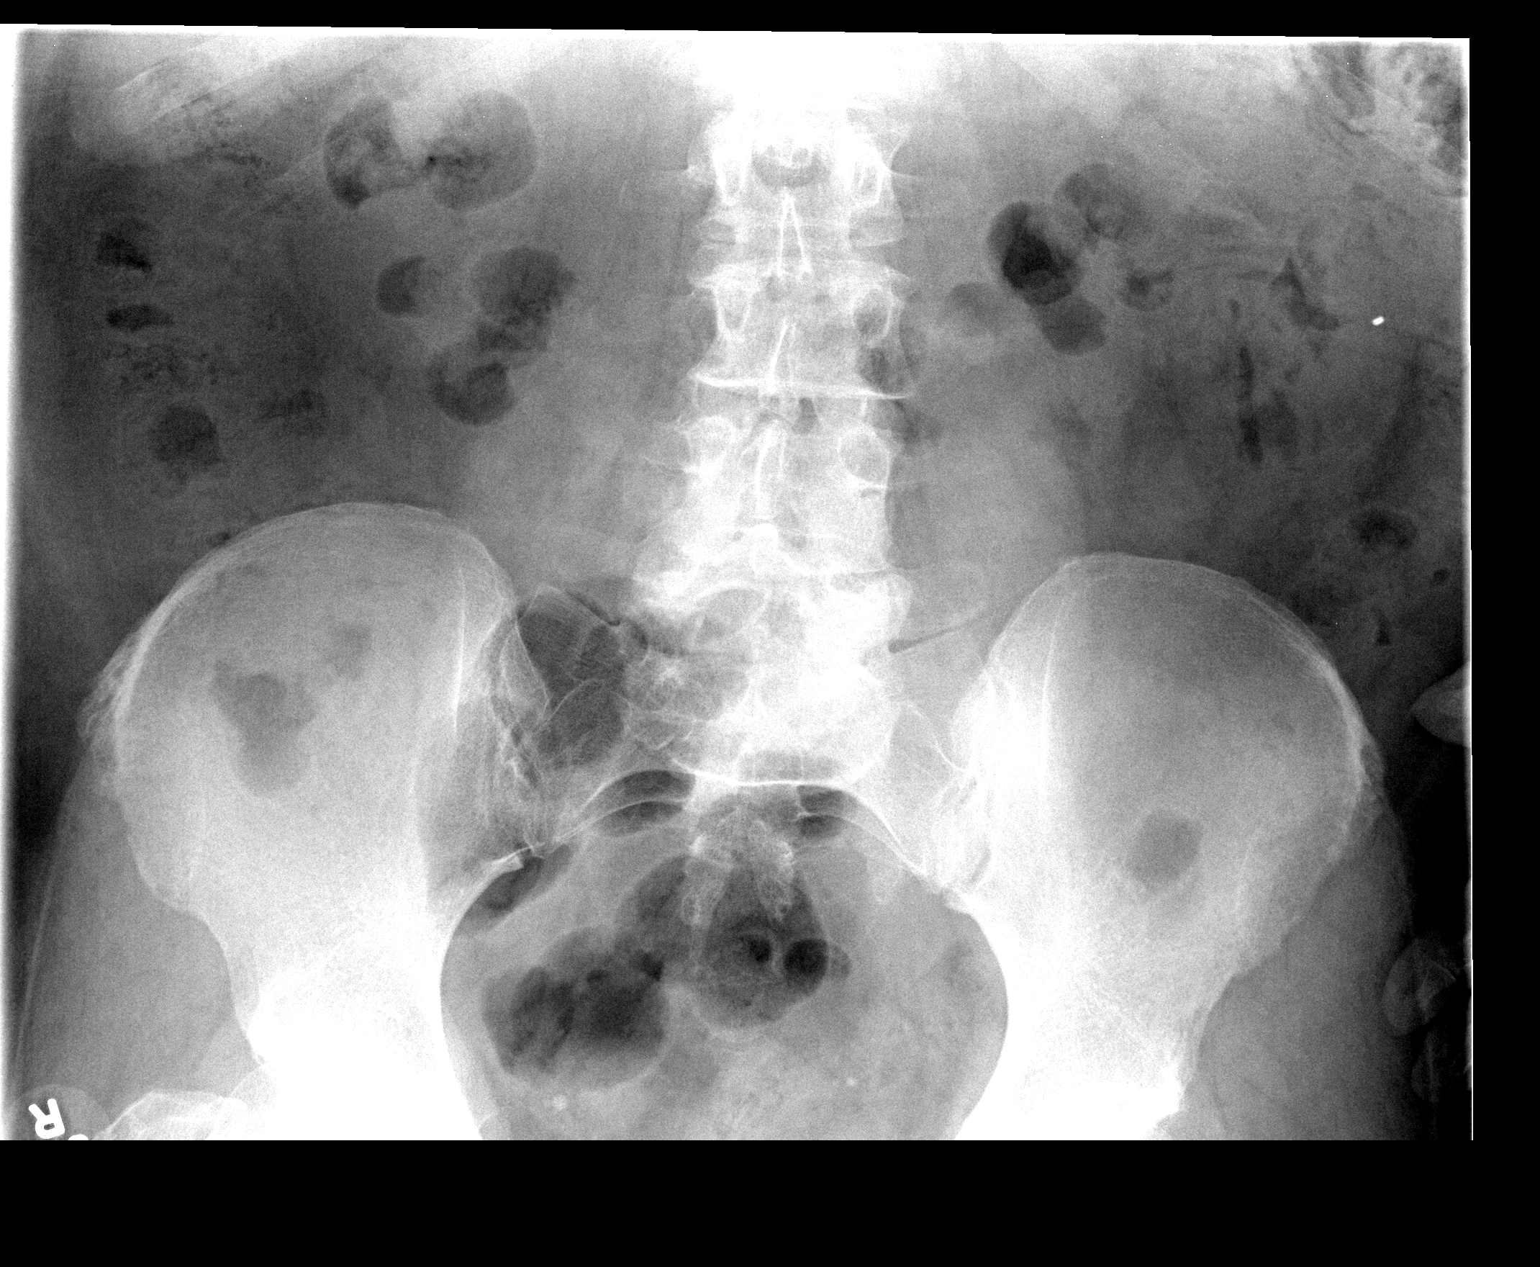

[2 of 2 positions shown; findings below may reference images not displayed]

FINDINGS: Surgical clips are seen in the left upper quadrant and in
the gastric area of both proximally and distally.  The stomach is
not significantly dilated on preliminary examination.  Bowel gas
pattern is normal.

The patient had no difficulty initiating swallowing.  Esophageal
peristalsis appeared normal.  No hiatal hernia, esophageal mass or
obstruction, stricture, or mucosal changes of esophagitis were
seen.  No gastroesophageal reflux was evident during examination.

There is no evidence of gastrojejunal anastomosis.  Contrast filled
the stomach normally.  No gastric mass, ulceration, inflammation,
or obstruction was seen.  Duodenum filled normally.  No duodenal
ulceration, mass, inflammation, or obstruction was seen.  There is
no leakage of contrast from the gastrointestinal tract.
IMPRESSION: Normal appearance of esophagus.  There is no evidence of
gastrojejunal anastomosis.  Contrast filled the stomach and emptied
into the duodenum without evidence of obstruction.  No mass or
obstruction is seen.  No inflammation or herniation is seen.

## 2012-01-09 ENCOUNTER — Other Ambulatory Visit: Payer: Self-pay

## 2012-01-09 MED ORDER — LUBIPROSTONE 24 MCG PO CAPS: 24.0000 ug | ORAL_CAPSULE | Freq: Two times a day (BID) | ORAL | Status: DC

## 2012-05-24 ENCOUNTER — Encounter: Payer: Self-pay | Admitting: *Deleted

## 2012-06-19 ENCOUNTER — Encounter: Payer: Self-pay | Admitting: Internal Medicine

## 2012-06-19 ENCOUNTER — Ambulatory Visit (INDEPENDENT_AMBULATORY_CARE_PROVIDER_SITE_OTHER): Payer: Medicaid Other | Admitting: Internal Medicine

## 2012-06-19 VITALS — BP 117/72 | HR 57 | Temp 98.0°F | Ht 64.0 in | Wt 231.4 lb

## 2012-06-19 DIAGNOSIS — K5989 Other specified functional intestinal disorders: Secondary | ICD-10-CM

## 2012-06-19 DIAGNOSIS — K599 Functional intestinal disorder, unspecified: Secondary | ICD-10-CM

## 2012-06-19 NOTE — Progress Notes (Signed)
Primary Care Physician:  Fredirick Maudlin, MD Primary Gastroenterologist:  Dr. Jena Gauss  Pre-Procedure History & Physical: HPI:  [name] is a 56 y.o. female here for followup of colonic inertia. Doing well on Amitiza 24 mcg  twice daily. Only occasional nausea. No rectal bleeding. Will be due for routine screening colonoscopy 2019.  Past Medical History  Diagnosis Date  . Depression   . Bipolar 1 disorder   . Chronic constipation   . Diverticulosis 01/31/2008    colonoscopy Dr Jena Gauss  . Obesity   . Schizoaffective disorder     Mental Health-Dr Duayne Cal  . PUD (peptic ulcer disease) 1988  . Colonic inertia   . Hyperplastic colon polyp   . Diverticula of colon     Past Surgical History  Procedure Date  . Tonsillectomy   . S/p hysterectomy     partial  . Gastric bypass     with Revision  . Wrist surgery   . Foot surgery     left  . Cholecystectomy 08/2010    cholelithiasis; Dr Lovell Sheehan  . Colonoscopy 01/31/2008    Dr. Jena Gauss- normal rectum, L sided diverticula, long tortuous colon. hyperplasitic  polyp.    Prior to Admission medications   Medication Sig Start Date End Date Taking? Authorizing Provider  buPROPion (WELLBUTRIN) 75 MG tablet Take 75 mg by mouth 2 (two) times daily.     Yes Historical Provider, MD  FLUoxetine (PROZAC) 40 MG capsule Take 40 mg by mouth daily.  06/06/12  Yes Historical Provider, MD  lubiprostone (AMITIZA) 24 MCG capsule Take 1 capsule (24 mcg total) by mouth 2 (two) times daily with a meal. 01/09/12  Yes Joselyn Arrow, NP  risperidone (RISPERDAL) 4 MG tablet Take 4 mg by mouth daily.  06/06/12  Yes Historical Provider, MD  traZODone (DESYREL) 100 MG tablet Take 100 mg by mouth at bedtime.    Yes Historical Provider, MD    Allergies as of 06/19/2012 - Review Complete 06/19/2012  Allergen Reaction Noted  . Ciprofloxacin Rash     Family History  Problem Relation Age of Onset  . Colon cancer Maternal Uncle 49  . Diabetes Father     History    Social History  . Marital Status: Divorced    Spouse Name: N/A    Number of Children: 2  . Years of Education: N/A   Occupational History  . disabled    Social History Main Topics  . Smoking status: Former Smoker -- 1.0 packs/day for 30 years    Types: Cigarettes    Quit date: 09/05/2004  . Smokeless tobacco: Former Neurosurgeon  . Alcohol Use: No  . Drug Use: No  . Sexually Active: Not on file   Other Topics Concern  . Not on file   Social History Narrative   Lives w/ mother2 grown children    Review of Systems: See HPI, otherwise negative ROS  Physical Exam: BP 117/72  Pulse 57  Temp 98 F (36.7 C) (Temporal)  Ht 5\' 4"  (1.626 m)  Wt 231 lb 6.4 oz (104.962 kg)  BMI 39.72 kg/m2 General:   Alert,  Well-developed, well-nourished, pleasant and cooperative in NAD Skin:  Intact without significant lesions or rashes. Eyes:  Sclera clear, no icterus.   Conjunctiva pink. Ears:  Normal auditory acuity. Nose:  No deformity, discharge,  or lesions. Mouth:  No deformity or lesions. Neck:  Supple; no masses or thyromegaly. No significant cervical adenopathy. Lungs:  Clear throughout to auscultation.  No wheezes, crackles, or rhonchi. No acute distress. Abdomen: Non-distended, normal bowel sounds.  Soft and nontender without appreciable mass or hepatosplenomegaly.  Pulses:  Normal pulses noted. Extremities:  Without clubbing or edema.  Impression/Plan:  History of colonic inertia. Doing well on Amitiza. Patient instructed to take med during meals to avoid nausea. We'll have her return 1 stool Hemoccult card. Office visit here in one year.

## 2012-06-19 NOTE — Patient Instructions (Addendum)
Return one stool sample to test for blood  Continue Amitiza 24 micrograms twice daily  Office visit 1 year

## 2012-06-20 ENCOUNTER — Ambulatory Visit (INDEPENDENT_AMBULATORY_CARE_PROVIDER_SITE_OTHER): Payer: PRIVATE HEALTH INSURANCE | Admitting: Internal Medicine

## 2012-06-20 DIAGNOSIS — K5989 Other specified functional intestinal disorders: Secondary | ICD-10-CM

## 2012-06-20 LAB — IFOBT (OCCULT BLOOD): IFOBT: NEGATIVE

## 2012-06-24 NOTE — Progress Notes (Signed)
ifobtest negative; recommend plan per O/V

## 2012-06-25 ENCOUNTER — Encounter: Payer: Self-pay | Admitting: General Practice

## 2012-07-10 ENCOUNTER — Other Ambulatory Visit (HOSPITAL_COMMUNITY): Payer: Self-pay | Admitting: Pulmonary Disease

## 2012-07-10 DIAGNOSIS — Z139 Encounter for screening, unspecified: Secondary | ICD-10-CM

## 2012-07-12 ENCOUNTER — Ambulatory Visit (HOSPITAL_COMMUNITY)
Admission: RE | Admit: 2012-07-12 | Discharge: 2012-07-12 | Disposition: A | Discharge status: Home / Self Care | Payer: PRIVATE HEALTH INSURANCE | Source: Ambulatory Visit | Attending: Pulmonary Disease | Admitting: Pulmonary Disease

## 2012-07-12 DIAGNOSIS — Z1231 Encounter for screening mammogram for malignant neoplasm of breast: Secondary | ICD-10-CM | POA: Insufficient documentation

## 2012-07-12 DIAGNOSIS — Z139 Encounter for screening, unspecified: Secondary | ICD-10-CM

## 2012-07-13 ENCOUNTER — Ambulatory Visit (HOSPITAL_COMMUNITY)
Admission: RE | Admit: 2012-07-13 | Discharge: 2012-07-13 | Disposition: A | Discharge status: Home / Self Care | Payer: PRIVATE HEALTH INSURANCE | Source: Ambulatory Visit | Attending: Pulmonary Disease | Admitting: Pulmonary Disease

## 2012-07-13 ENCOUNTER — Other Ambulatory Visit: Payer: Self-pay | Admitting: Internal Medicine

## 2012-07-13 DIAGNOSIS — Z90710 Acquired absence of both cervix and uterus: Secondary | ICD-10-CM | POA: Insufficient documentation

## 2012-07-13 DIAGNOSIS — Z780 Asymptomatic menopausal state: Secondary | ICD-10-CM | POA: Insufficient documentation

## 2012-07-13 DIAGNOSIS — Z139 Encounter for screening, unspecified: Secondary | ICD-10-CM | POA: Insufficient documentation

## 2012-07-13 MED ORDER — LUBIPROSTONE 24 MCG PO CAPS: 24.0000 ug | ORAL_CAPSULE | Freq: Two times a day (BID) | ORAL | Status: DC

## 2012-07-13 NOTE — Telephone Encounter (Signed)
Pt came to window asking for RF on her amitiza. She was given samples by nurse and I told her that I would put note in for RF to be faxed to [facility]

## 2012-07-13 NOTE — Telephone Encounter (Signed)
done

## 2013-01-30 ENCOUNTER — Other Ambulatory Visit: Payer: Self-pay | Admitting: Gastroenterology

## 2013-06-05 ENCOUNTER — Encounter: Payer: Self-pay | Admitting: Internal Medicine

## 2013-06-27 ENCOUNTER — Ambulatory Visit (INDEPENDENT_AMBULATORY_CARE_PROVIDER_SITE_OTHER): Payer: PRIVATE HEALTH INSURANCE | Admitting: Gastroenterology

## 2013-06-27 ENCOUNTER — Encounter (INDEPENDENT_AMBULATORY_CARE_PROVIDER_SITE_OTHER): Payer: Self-pay

## 2013-06-27 ENCOUNTER — Encounter: Payer: Self-pay | Admitting: Gastroenterology

## 2013-06-27 VITALS — BP 112/71 | HR 65 | Temp 97.1°F | Ht 64.0 in | Wt 193.4 lb

## 2013-06-27 DIAGNOSIS — K5900 Constipation, unspecified: Secondary | ICD-10-CM

## 2013-06-27 MED ORDER — LINACLOTIDE 290 MCG PO CAPS: 290.0000 ug | ORAL_CAPSULE | Freq: Every day | ORAL | Status: DC

## 2013-06-27 NOTE — Progress Notes (Signed)
Referring Provider: Fredirick Maudlin, MD Primary Care Physician:  Fredirick Maudlin, MD Primary GI: Dr. Jena Gauss   Chief Complaint  Patient presents with  . Follow-up    HPI:   [name] presents today in follow-up for colonic inertia. Historically has done well on Amitiza 24 mcg BID. Last seen Oct 2013. Due for screening colonoscopy again in 2019. Ifobt negative last year.  States now having a BM once every 2 weeks. Present for a few months. Added calcium to medications several months ago. No rectal bleeding. Feels bloated with constipation. No nausea or vomiting. No dysphagia, good appetite. Down 40 lbs from Oct 2013. PURPOSEFUL through diet and exercise.   Past Medical History  Diagnosis Date  . Depression   . Bipolar 1 disorder   . Chronic constipation   . Diverticulosis 01/31/2008    colonoscopy Dr Jena Gauss  . Obesity   . Schizoaffective disorder     Mental Health-Dr Duayne Cal  . PUD (peptic ulcer disease) 1988  . Colonic inertia   . Hyperplastic colon polyp   . Diverticula of colon     Past Surgical History  Procedure Laterality Date  . Tonsillectomy    . S/p hysterectomy      partial  . Gastric bypass      with Revision  . Wrist surgery    . Foot surgery      left  . Cholecystectomy  08/2010    cholelithiasis; Dr Lovell Sheehan  . Colonoscopy  01/31/2008    Dr. Jena Gauss- normal rectum, L sided diverticula, long tortuous colon. hyperplasitic  polyp.  . Ercp w/ sphincterotomy and balloon dilation  08/05/2010    normal ampulla/mild erythema in the gastric remnant without ulcerations or erosions. normal retroflexed view of the cardia/anastomosis normal/distal common bile duct stones s/p extraction and shincterotomy    Current Outpatient Prescriptions  Medication Sig Dispense Refill  . buPROPion (WELLBUTRIN) 75 MG tablet Take 75 mg by mouth 2 (two) times daily.        . calcium-vitamin D 250-100 MG-UNIT per tablet Take 1 tablet by mouth 2 (two) times daily.      Marland Kitchen FLUoxetine  (PROZAC) 40 MG capsule Take 40 mg by mouth daily.       . risperidone (RISPERDAL) 4 MG tablet Take 4 mg by mouth daily.       . traZODone (DESYREL) 100 MG tablet Take 100 mg by mouth at bedtime.       . AMITIZA 24 MCG capsule TAKE (1) CAPSULE BY MOUTH TWICE DAILY DURING MEALS.  62 capsule  11   No current facility-administered medications for this visit.    Allergies as of 06/27/2013 - Review Complete 06/27/2013  Allergen Reaction Noted  . Ciprofloxacin Rash     Family History  Problem Relation Age of Onset  . Colon cancer Maternal Uncle 49  . Diabetes Father     History   Social History  . Marital Status: Divorced    Spouse Name: N/A    Number of Children: 2  . Years of Education: N/A   Occupational History  . disabled    Social History Main Topics  . Smoking status: Former Smoker -- 1.00 packs/day for 30 years    Types: Cigarettes    Quit date: 09/05/2004  . Smokeless tobacco: Former Neurosurgeon  . Alcohol Use: No  . Drug Use: No  . Sexual Activity: None   Other Topics Concern  . None   Social History Narrative   Lives w/  mother   2 grown children    Review of Systems: As mentioned in HPI.   Physical Exam: BP 112/71  Pulse 65  Temp(Src) 97.1 F (36.2 C) (Oral)  Ht 5\' 4"  (1.626 m)  Wt 193 lb 6.4 oz (87.726 kg)  BMI 33.18 kg/m2 General:   Alert and oriented. No distress noted. Pleasant and cooperative.  Head:  Normocephalic and atraumatic. Eyes:  Conjuctiva clear without scleral icterus. Mouth:  Oral mucosa pink and moist. Good dentition. No lesions. Heart:  S1, S2 present without murmurs, rubs, or gallops. Regular rate and rhythm. Abdomen:  +BS, soft, non-tender and non-distended. No rebound or guarding. No HSM or masses noted. Msk:  Symmetrical without gross deformities. Normal posture. Extremities:  Without edema. Neurologic:  Alert and  oriented x4;  grossly normal neurologically. Skin:  Intact without significant lesions or rashes. Psych:  Alert and  cooperative. Normal mood and affect.

## 2013-06-27 NOTE — Assessment & Plan Note (Signed)
History of colonic inertia, now with failure of Amitiza 24 mcg BID. No concerning physical findings on exam. Likely addition of calcium to medication regimen could be exacerbating constipation. No rectal bleeding. No first-degree relatives with colon cancer; maternal uncle diagnosed age 57. Last colonoscopy 2009.   I would like to check a TSH, ifobt, and trial Linzess 290 mcg daily. She will return in 6-8 weeks for reassessment. If ifobt is positive or failure to improve with Linzess, we will pursue an early interval colonoscopy. She is aware of this plan and agrees.

## 2013-06-27 NOTE — Patient Instructions (Signed)
Please have your blood work done and complete the stool sample. If the stool sample is positive, we will proceed with a colonoscopy.   Stop Amitiza. Start taking Linzess 290 mcg daily, 30 minutes prior to breakfast. We have provided a voucher and prescription for 1 year.   I will see you back in 6-8 weeks. If no improvement in constipation or blood in your stool, we will need to do a colonoscopy.

## 2013-06-27 NOTE — Progress Notes (Signed)
cc'd to pcp 

## 2013-07-01 LAB — TSH: TSH: 1.091 u[IU]/mL (ref 0.350–4.500)

## 2013-07-02 NOTE — Progress Notes (Signed)
Quick Note:  TSH normal.  Awaiting ifobt.  Patient to return in 6-8 weeks ______

## 2013-07-03 ENCOUNTER — Ambulatory Visit (INDEPENDENT_AMBULATORY_CARE_PROVIDER_SITE_OTHER): Payer: PRIVATE HEALTH INSURANCE | Admitting: Gastroenterology

## 2013-07-03 DIAGNOSIS — K5900 Constipation, unspecified: Secondary | ICD-10-CM

## 2013-07-03 LAB — IFOBT (OCCULT BLOOD): IFOBT: NEGATIVE

## 2013-07-03 NOTE — Progress Notes (Signed)
Noted. Keep f/u in several weeks. Recommend yearly hemoccults. If no improvement with Linzess, will offer screening colonoscopy.

## 2013-07-03 NOTE — Progress Notes (Signed)
IFOBT was negative 

## 2013-08-08 ENCOUNTER — Encounter (INDEPENDENT_AMBULATORY_CARE_PROVIDER_SITE_OTHER): Payer: Self-pay

## 2013-08-08 ENCOUNTER — Ambulatory Visit (INDEPENDENT_AMBULATORY_CARE_PROVIDER_SITE_OTHER): Payer: PRIVATE HEALTH INSURANCE | Admitting: Gastroenterology

## 2013-08-08 ENCOUNTER — Encounter: Payer: Self-pay | Admitting: Gastroenterology

## 2013-08-08 VITALS — BP 110/72 | HR 60 | Temp 96.5°F | Ht 64.0 in | Wt 187.6 lb

## 2013-08-08 DIAGNOSIS — K5900 Constipation, unspecified: Secondary | ICD-10-CM

## 2013-08-08 DIAGNOSIS — R1032 Left lower quadrant pain: Secondary | ICD-10-CM

## 2013-08-08 LAB — HEPATIC FUNCTION PANEL
ALT: 13 U/L (ref 0–35)
AST: 14 U/L (ref 0–37)
Albumin: 4.2 g/dL (ref 3.5–5.2)
Alkaline Phosphatase: 64 U/L (ref 39–117)
Bilirubin, Direct: 0.1 mg/dL (ref 0.0–0.3)
Indirect Bilirubin: 0.3 mg/dL (ref 0.0–0.9)
Total Bilirubin: 0.4 mg/dL (ref 0.3–1.2)
Total Protein: 7.1 g/dL (ref 6.0–8.3)

## 2013-08-08 LAB — BASIC METABOLIC PANEL
BUN: 16 mg/dL (ref 6–23)
CO2: 27 mEq/L (ref 19–32)
Calcium: 10 mg/dL (ref 8.4–10.5)
Chloride: 104 mEq/L (ref 96–112)
Creat: 1.1 mg/dL (ref 0.50–1.10)
Glucose, Bld: 76 mg/dL (ref 70–99)
Potassium: 4.3 mEq/L (ref 3.5–5.3)
Sodium: 139 mEq/L (ref 135–145)

## 2013-08-08 LAB — CBC
HCT: 40.7 % (ref 36.0–46.0)
Hemoglobin: 13.7 g/dL (ref 12.0–15.0)
MCH: 29.6 pg (ref 26.0–34.0)
MCHC: 33.7 g/dL (ref 30.0–36.0)
MCV: 87.9 fL (ref 78.0–100.0)
Platelets: 457 10*3/uL — ABNORMAL HIGH (ref 150–400)
RBC: 4.63 MIL/uL (ref 3.87–5.11)
RDW: 13.3 % (ref 11.5–15.5)
WBC: 9.2 10*3/uL (ref 4.0–10.5)

## 2013-08-08 NOTE — Progress Notes (Signed)
Referring Provider: Fredirick Maudlin, MD Primary Care Physician:  Fredirick Maudlin, MD Primary GI: Dr. Jena Gauss   Chief Complaint  Patient presents with  . Follow-up    Linzess is working great    HPI:   Pleasant 57 year old female returning today after initiation of Linzess late October. 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 History of colonic inertia. Screening colonoscopy due in 2019. TSH normal, ifobt negative.   LLQ dull ache. No aggravating or relieving factors. Constant. Present for a few months. Doing well with Linzess. No need for Miralax. Denies diarrhea. No rectal bleeding.   Past Medical History  Diagnosis Date  . Depression   . Bipolar 1 disorder   . Chronic constipation   . Diverticulosis 01/31/2008    colonoscopy Dr Jena Gauss  . Obesity   . Schizoaffective disorder     Mental Health-Dr Duayne Cal  . PUD (peptic ulcer disease) 1988  . Colonic inertia   . Hyperplastic colon polyp   . Diverticula of colon     Past Surgical History  Procedure Laterality Date  . Tonsillectomy    . S/p hysterectomy      partial  . Gastric bypass      with Revision, Dr. Dione Booze  . Wrist surgery    . Foot surgery      left  . Cholecystectomy  08/2010    cholelithiasis; Dr Lovell Sheehan  . Colonoscopy  01/31/2008    Dr. Jena Gauss- normal rectum, L sided diverticula, long tortuous colon. hyperplasitic  polyp.  . Ercp w/ sphincterotomy and balloon dilation  08/05/2010    normal ampulla/mild erythema in the gastric remnant without ulcerations or erosions. normal retroflexed view of the cardia/anastomosis normal/distal common bile duct stones s/p extraction and shincterotomy    Current Outpatient Prescriptions  Medication Sig Dispense Refill  . buPROPion (WELLBUTRIN) 75 MG tablet Take 75 mg by mouth daily.       . calcium-vitamin D 250-100 MG-UNIT per tablet Take 1 tablet by mouth daily.       Marland Kitchen FLUoxetine (PROZAC) 40 MG capsule Take 40 mg by mouth daily.       . Linaclotide (LINZESS) 290 MCG CAPS capsule  Take 1 capsule (290 mcg total) by mouth daily. Take 30 minutes before breakfast  30 capsule  11  . risperidone (RISPERDAL) 4 MG tablet Take 4 mg by mouth daily.       . traZODone (DESYREL) 100 MG tablet Take 100 mg by mouth at bedtime.        No current facility-administered medications for this visit.    Allergies as of 08/08/2013 - Review Complete 08/08/2013  Allergen Reaction Noted  . Ciprofloxacin Rash     Family History  Problem Relation Age of Onset  . Colon cancer Maternal Uncle 49  . Diabetes Father     History   Social History  . Marital Status: Divorced    Spouse Name: N/A    Number of Children: 2  . Years of Education: N/A   Occupational History  . disabled    Social History Main Topics  . Smoking status: Former Smoker -- 1.00 packs/day for 30 years    Types: Cigarettes    Quit date: 09/05/2004  . Smokeless tobacco: Former Neurosurgeon     Comment: Quit smoking x 10 years  . Alcohol Use: No  . Drug Use: No  . Sexual Activity: None   Other Topics Concern  . None   Social History Narrative   Lives w/ mother   2 grown children  Review of Systems: As mentioned in HPI.   Physical Exam: BP 110/72  Pulse 60  Temp(Src) 96.5 F (35.8 C) (Oral)  Ht 5\' 4"  (1.626 m)  Wt 187 lb 9.6 oz (85.095 kg)  BMI 32.19 kg/m2 General:   Alert and oriented. No distress noted. Pleasant and cooperative.  Head:  Normocephalic and atraumatic. Eyes:  Conjuctiva clear without scleral icterus. Mouth:  Oral mucosa pink and moist. Good dentition. No lesions. Heart:  S1, S2 present without murmurs, rubs, or gallops. Regular rate and rhythm. Abdomen:  +BS, soft, very mild LLQ discomfort and non-distended. No rebound or guarding. No HSM or masses noted. Msk:  Symmetrical without gross deformities. Normal posture. Extremities:  Without edema. Neurologic:  Alert and  oriented x4;  grossly normal neurologically. Skin:  Intact without significant lesions or rashes. Psych:  Alert and  cooperative. Normal mood and affect.   Last CT in Nov 2011: Probable lymph node in gastrohepatic ligament, possibility of cirrhosis. Gallstones noted.

## 2013-08-08 NOTE — Patient Instructions (Signed)
Please have blood work done today.   We have scheduled you for a CT scan of your belly. Further recommendations to follow.  I'm so glad the Linzess is working well for you! Keep taking this every day.

## 2013-08-12 ENCOUNTER — Other Ambulatory Visit: Payer: Self-pay | Admitting: Gastroenterology

## 2013-08-12 ENCOUNTER — Ambulatory Visit (HOSPITAL_COMMUNITY)
Admission: RE | Admit: 2013-08-12 | Discharge: 2013-08-12 | Disposition: A | Discharge status: Home / Self Care | Payer: PRIVATE HEALTH INSURANCE | Source: Ambulatory Visit | Attending: Gastroenterology | Admitting: Gastroenterology

## 2013-08-12 DIAGNOSIS — R935 Abnormal findings on diagnostic imaging of other abdominal regions, including retroperitoneum: Secondary | ICD-10-CM | POA: Insufficient documentation

## 2013-08-12 DIAGNOSIS — R1032 Left lower quadrant pain: Secondary | ICD-10-CM | POA: Insufficient documentation

## 2013-08-12 DIAGNOSIS — K439 Ventral hernia without obstruction or gangrene: Secondary | ICD-10-CM | POA: Insufficient documentation

## 2013-08-12 DIAGNOSIS — Z9884 Bariatric surgery status: Secondary | ICD-10-CM | POA: Insufficient documentation

## 2013-08-12 DIAGNOSIS — K7689 Other specified diseases of liver: Secondary | ICD-10-CM | POA: Insufficient documentation

## 2013-08-12 MED ORDER — IOHEXOL 300 MG/ML  SOLN
100.0000 mL | Freq: Once | INTRAMUSCULAR | Status: AC | PRN
  Administered 2013-08-12: 100 mL via INTRAVENOUS

## 2013-08-13 DIAGNOSIS — R1032 Left lower quadrant pain: Secondary | ICD-10-CM | POA: Insufficient documentation

## 2013-08-13 NOTE — Assessment & Plan Note (Signed)
Persistent, unclear etiology. Proceed with repeat CT. Incidentally, last CT in Nov 2011 with probably lymph node in gastrohepatic ligament, question of cirrhosis. No further evaluation performed. Question of underlying cirrhosis. CT will help sort out further. Obtain updated LFTs. If found to have cirrhosis, will need EGD to screen for varices and further work-up of etiology.

## 2013-08-13 NOTE — Assessment & Plan Note (Signed)
In the setting of colonic inertia. Doing well with Linzess 290 mcg daily. Next colonoscopy due in 2019.

## 2013-08-14 NOTE — Progress Notes (Signed)
cc'd to pcp 

## 2013-09-09 ENCOUNTER — Other Ambulatory Visit (HOSPITAL_COMMUNITY): Payer: Self-pay | Admitting: Pulmonary Disease

## 2013-09-09 DIAGNOSIS — Z139 Encounter for screening, unspecified: Secondary | ICD-10-CM

## 2013-09-12 ENCOUNTER — Ambulatory Visit (HOSPITAL_COMMUNITY)
Admission: RE | Admit: 2013-09-12 | Discharge: 2013-09-12 | Disposition: A | Discharge status: Home / Self Care | Payer: PRIVATE HEALTH INSURANCE | Source: Ambulatory Visit | Attending: Pulmonary Disease | Admitting: Pulmonary Disease

## 2013-09-12 DIAGNOSIS — Z139 Encounter for screening, unspecified: Secondary | ICD-10-CM

## 2013-09-12 DIAGNOSIS — Z1231 Encounter for screening mammogram for malignant neoplasm of breast: Secondary | ICD-10-CM | POA: Insufficient documentation

## 2013-10-16 ENCOUNTER — Ambulatory Visit (INDEPENDENT_AMBULATORY_CARE_PROVIDER_SITE_OTHER): Payer: PRIVATE HEALTH INSURANCE | Admitting: Gastroenterology

## 2013-10-16 ENCOUNTER — Encounter: Payer: Self-pay | Admitting: Gastroenterology

## 2013-10-16 ENCOUNTER — Encounter (INDEPENDENT_AMBULATORY_CARE_PROVIDER_SITE_OTHER): Payer: Self-pay

## 2013-10-16 VITALS — BP 90/52 | HR 59 | Temp 96.4°F | Ht 64.0 in | Wt 189.0 lb

## 2013-10-16 DIAGNOSIS — K5900 Constipation, unspecified: Secondary | ICD-10-CM

## 2013-10-16 DIAGNOSIS — K7460 Unspecified cirrhosis of liver: Secondary | ICD-10-CM

## 2013-10-16 NOTE — Progress Notes (Signed)
Referring Provider: Alonza Bogus, MD Primary Care Physician:  Alonza Bogus, MD Primary GI: Dr. Gala Romney   Chief Complaint  Patient presents with  . Follow-up    HPI:   [name] presents today with a history of colonic inertia, managed with Linzess. Screening colonoscopy due 2019. Ifobt negative recently. Last visited noted chronic LLQ dull ache. CT ordered showing likely cirrhosis. Likely a reactive node between caudate lobe of liver and superior portion of pancreas. Right mid abdomen abdominal wall hernia with portion of right transverse colon without concerning features.  Linzess 290 mcg once every other day. No rectal bleeding. LLQ "dull ache", intermittent. Hx of DDD; states if lower back starts hurting, lower abdomen will hurt. No confusion, mental status changes. ERCP in 2011 due to CBD stones, no varices noted at that point.   Blood transfusion 1989. No tattoos, IV drug abuse.    Past Medical History  Diagnosis Date  . Depression   . Bipolar 1 disorder   . Chronic constipation   . Diverticulosis 01/31/2008    colonoscopy Dr Gala Romney  . Obesity   . Schizoaffective disorder     Mental Health-Dr Elias Else  . PUD (peptic ulcer disease) 1988  . Colonic inertia   . Hyperplastic colon polyp   . Diverticula of colon     Past Surgical History  Procedure Laterality Date  . Tonsillectomy    . S/p hysterectomy      partial  . Gastric bypass      with Revision, Dr. Duffy Rhody  . Wrist surgery    . Foot surgery      left  . Cholecystectomy  08/2010    cholelithiasis; Dr Arnoldo Morale  . Colonoscopy  01/31/2008    Dr. Gala Romney- normal rectum, L sided diverticula, long tortuous colon. hyperplasitic  polyp.  . Ercp w/ sphincterotomy and balloon dilation  08/05/2010    normal ampulla/mild erythema in the gastric remnant without ulcerations or erosions. normal retroflexed view of the cardia/anastomosis normal/distal common bile duct stones s/p extraction and shincterotomy     Current Outpatient Prescriptions  Medication Sig Dispense Refill  . buPROPion (WELLBUTRIN) 75 MG tablet Take 75 mg by mouth daily.       . calcium-vitamin D 250-100 MG-UNIT per tablet Take 1 tablet by mouth daily.       Marland Kitchen FLUoxetine (PROZAC) 40 MG capsule Take 40 mg by mouth daily.       . Linaclotide (LINZESS) 290 MCG CAPS capsule Take 1 capsule (290 mcg total) by mouth daily. Take 30 minutes before breakfast  30 capsule  11  . risperidone (RISPERDAL) 4 MG tablet Take 4 mg by mouth daily.       . traZODone (DESYREL) 100 MG tablet Take 100 mg by mouth at bedtime.        No current facility-administered medications for this visit.    Allergies as of 10/16/2013 - Review Complete 10/16/2013  Allergen Reaction Noted  . Ciprofloxacin Rash     Family History  Problem Relation Age of Onset  . Colon cancer Maternal Uncle 16  . Diabetes Father   . Liver disease Neg Hx     History   Social History  . Marital Status: Divorced    Spouse Name: N/A    Number of Children: 2  . Years of Education: N/A   Occupational History  . disabled    Social History Main Topics  . Smoking status: Former Smoker -- 1.00 packs/day for 30 years  Types: Cigarettes    Quit date: 09/05/2004  . Smokeless tobacco: Former Systems developer     Comment: Quit smoking x 10 years  . Alcohol Use: No  . Drug Use: No  . Sexual Activity: None   Other Topics Concern  . None   Social History Narrative   Lives w/ mother   2 grown children    Review of Systems: Negative unless mentioned in HPI.   Physical Exam: BP 90/52  Pulse 59  Temp(Src) 96.4 F (35.8 C) (Oral)  Ht 5\' 4"  (1.626 m)  Wt 189 lb (85.73 kg)  BMI 32.43 kg/m2 General:   Alert and oriented. No distress noted. Pleasant and cooperative.  Head:  Normocephalic and atraumatic. Eyes:  Conjuctiva clear without scleral icterus. Mouth:  Oral mucosa pink and moist.  Heart:  S1, S2 present without murmurs, rubs, or gallops. Regular rate and  rhythm. Abdomen:  +BS, soft, non-tender and non-distended. No obvious HSM. Right ventral hernia Msk:  Symmetrical without gross deformities. Normal posture. Extremities:  Without edema. Neurologic:  Alert and  oriented x4;  grossly normal neurologically. Skin:  Intact without significant lesions or rashes. Cervical Nodes:  No significant cervical adenopathy. Psych:  Alert and cooperative. Normal mood and affect.  Lab Results  Component Value Date   WBC 9.2 08/08/2013   HGB 13.7 08/08/2013   HCT 40.7 08/08/2013   MCV 87.9 08/08/2013   PLT 457* 08/08/2013   Lab Results  Component Value Date   ALT 13 08/08/2013   AST 14 08/08/2013   ALKPHOS 64 08/08/2013   BILITOT 0.4 08/08/2013

## 2013-10-16 NOTE — Patient Instructions (Signed)
Please have blood work done. We are checking for any potential causes of cirrhosis. Most likely, it is due to a "fatty" liver.   Please have the vaccinations completed at your primary care doctor's office.   We have scheduled you for an upper endoscopy to check for any enlarged blood vessels in your esophagus that can come from having cirrhosis.   We will see you back in 6 months!!   Cirrhosis Cirrhosis is a condition of scarring of the liver which is caused when the liver has tried repairing itself following damage. This damage may come from a previous infection such as one of the forms of hepatitis (usually hepatitis C), or the damage may come from being injured by toxins. The main toxin that causes this damage is alcohol. The scarring of the liver from use of alcohol is irreversible. That means the liver cannot return to normal even though alcohol is not used any more. The main danger of hepatitis C infection is that it may cause long-lasting (chronic) liver disease, and this also may lead to cirrhosis. This complication is progressive and irreversible. CAUSES  Prior to available blood tests, hepatitis C could be contracted by blood transfusions. Since testing of blood has improved, this is now unlikely. This infection can also be contracted through intravenous drug use and the sharing of needles. It can also be contracted through sexual relationships. The injury caused by alcohol comes from too much use. It is not a few drinks that poison the liver, but years of misuse. Usually there will be some signs and symptoms early with scarring of the liver that suggest the development of better habits. Alcohol should never be used while using acetaminophen. A small dose of both taken together may cause irreversible damage to the liver. HOME CARE INSTRUCTIONS  There is no specific treatment for cirrhosis. However, there are things you can do to avoid making the condition worse.  Rest as needed.  Eat a  well-balanced diet. Your caregiver can help you with suggestions.  Vitamin supplements including vitamins A, K, D, and thiamine can help.  A low-salt diet, water restriction, or diuretic medicine may be needed to reduce fluid retention.  Avoid alcohol. This can be extremely toxic if combined with acetaminophen.  Avoid drugs which are toxic to the liver. Some of these include isoniazid, methyldopa, acetaminophen, anabolic steroids (muscle-building drugs), erythromycin, and oral contraceptives (birth control pills). Check with your caregiver to make sure medicines you are presently taking will not be harmful.  Periodic blood tests may be required. Follow your caregiver's advice regarding the timing of these.  Milk thistle is an herbal remedy which does protect the liver against toxins. However, it will not help once the liver has been scarred. SEEK MEDICAL CARE IF:  You have increasing fatigue or weakness.  You develop swelling of the hands, feet, legs, or face.  You vomit bright red blood, or a coffee ground appearing material.  You have blood in your stools, or the stools turn black and tarry.  You have a fever.  You develop loss of appetite, or have nausea and vomiting.  You develop jaundice.  You develop easy bruising or bleeding.  You have worsening of any of the problems you are concerned about. Document Released: 08/22/2005 Document Revised: 11/14/2011 Document Reviewed: 04/09/2008 [facility] Patient Information 2014 [place].

## 2013-10-17 ENCOUNTER — Encounter (HOSPITAL_COMMUNITY): Payer: Self-pay | Admitting: Pharmacy Technician

## 2013-10-18 ENCOUNTER — Telehealth: Payer: Self-pay | Admitting: Gastroenterology

## 2013-10-18 DIAGNOSIS — K7460 Unspecified cirrhosis of liver: Secondary | ICD-10-CM | POA: Insufficient documentation

## 2013-10-18 NOTE — Assessment & Plan Note (Signed)
58 year old female with new findings of cirrhosis on CT; likely secondary to history of fatty liver. Well-compensated at this time. Needs EGD for esophageal variceal screening. Will draw further labs for completeness' sake. Next [place] abd due in July 2015. Of note, likely reactive node described on CT between caudate lobe of liver and superior portion of pancreas. This was documented on prior MRI in 2011. Favored to be benign.   Proceed with upper endoscopy in the near future with Dr. Gala Romney. The risks, benefits, and alternatives have been discussed in detail with patient. They have stated understanding and desire to proceed.  Hep A and B vaccinations Viral markers, AMA, ANA, ASMA, INR, immunoglobulins. No further work-up unless indicated; likely fatty liver.

## 2013-10-18 NOTE — Assessment & Plan Note (Signed)
Continue Linzess. Colonoscopy 2019. Negative ifobt on file.

## 2013-10-18 NOTE — Telephone Encounter (Signed)
I'm not sure if I requested this yet or not, but please nic patient to return in 6 months for cirrhosis follow-up. Will need [place] at that time.

## 2013-10-21 NOTE — Progress Notes (Signed)
cc'd to pcp 

## 2013-10-24 ENCOUNTER — Encounter (HOSPITAL_COMMUNITY): Payer: Self-pay | Admitting: *Deleted

## 2013-10-24 ENCOUNTER — Ambulatory Visit (HOSPITAL_COMMUNITY)
Admission: RE | Admit: 2013-10-24 | Discharge: 2013-10-24 | Disposition: A | Discharge status: Home / Self Care | Payer: PRIVATE HEALTH INSURANCE | Source: Ambulatory Visit | Attending: Internal Medicine | Admitting: Internal Medicine

## 2013-10-24 ENCOUNTER — Encounter (HOSPITAL_COMMUNITY)
Admission: RE | Disposition: A | Discharge status: Home / Self Care | Payer: Self-pay | Source: Ambulatory Visit | Attending: Internal Medicine

## 2013-10-24 DIAGNOSIS — K319 Disease of stomach and duodenum, unspecified: Secondary | ICD-10-CM | POA: Insufficient documentation

## 2013-10-24 DIAGNOSIS — K5289 Other specified noninfective gastroenteritis and colitis: Secondary | ICD-10-CM | POA: Insufficient documentation

## 2013-10-24 DIAGNOSIS — K7460 Unspecified cirrhosis of liver: Secondary | ICD-10-CM

## 2013-10-24 HISTORY — PX: ESOPHAGOGASTRODUODENOSCOPY: SHX5428

## 2013-10-24 SURGERY — EGD (ESOPHAGOGASTRODUODENOSCOPY)
Anesthesia: Moderate Sedation

## 2013-10-24 MED ORDER — LIDOCAINE VISCOUS 2 % MT SOLN
OROMUCOSAL | Status: DC | PRN
  Administered 2013-10-24: 1 via OROMUCOSAL

## 2013-10-24 MED ORDER — LIDOCAINE VISCOUS 2 % MT SOLN
OROMUCOSAL | Status: AC
  Filled 2013-10-24: qty 15

## 2013-10-24 MED ORDER — ONDANSETRON HCL 4 MG/2ML IJ SOLN
INTRAMUSCULAR | Status: DC | PRN
  Administered 2013-10-24: 4 mg via INTRAVENOUS

## 2013-10-24 MED ORDER — STERILE WATER FOR IRRIGATION IR SOLN
Status: DC | PRN
  Administered 2013-10-24: 14:00:00

## 2013-10-24 MED ORDER — MIDAZOLAM HCL 5 MG/5ML IJ SOLN
INTRAMUSCULAR | Status: AC
  Filled 2013-10-24: qty 10

## 2013-10-24 MED ORDER — ONDANSETRON HCL 4 MG/2ML IJ SOLN
INTRAMUSCULAR | Status: AC
  Filled 2013-10-24: qty 2

## 2013-10-24 MED ORDER — SODIUM CHLORIDE 0.9 % IV SOLN
INTRAVENOUS | Status: DC
  Administered 2013-10-24: 1000 mL via INTRAVENOUS

## 2013-10-24 MED ORDER — MEPERIDINE HCL 100 MG/ML IJ SOLN
INTRAMUSCULAR | Status: AC
  Filled 2013-10-24: qty 2

## 2013-10-24 MED ORDER — MEPERIDINE HCL 100 MG/ML IJ SOLN
INTRAMUSCULAR | Status: DC | PRN
  Administered 2013-10-24 (×3): 50 mg via INTRAVENOUS

## 2013-10-24 MED ORDER — MIDAZOLAM HCL 5 MG/5ML IJ SOLN
INTRAMUSCULAR | Status: DC | PRN
  Administered 2013-10-24 (×3): 2 mg via INTRAVENOUS

## 2013-10-24 NOTE — Discharge Instructions (Addendum)
EGD Discharge instructions Please read the instructions outlined below and refer to this sheet in the next few weeks. These discharge instructions provide you with general information on caring for yourself after you leave the hospital. Your doctor may also give you specific instructions. While your treatment has been planned according to the most current medical practices available, unavoidable complications occasionally occur. If you have any problems or questions after discharge, please call your doctor. ACTIVITY  You may resume your regular activity but move at a slower pace for the next 24 hours.   Take frequent rest periods for the next 24 hours.   Walking will help expel (get rid of) the air and reduce the bloated feeling in your abdomen.   No driving for 24 hours (because of the anesthesia (medicine) used during the test).   You may shower.   Do not sign any important legal documents or operate any machinery for 24 hours (because of the anesthesia used during the test).  NUTRITION  Drink plenty of fluids.   You may resume your normal diet.   Begin with a light meal and progress to your normal diet.   Avoid alcoholic beverages for 24 hours or as instructed by your caregiver.  MEDICATIONS  You may resume your normal medications unless your caregiver tells you otherwise.  WHAT YOU CAN EXPECT TODAY  You may experience abdominal discomfort such as a feeling of fullness or gas pains.  FOLLOW-UP  Your doctor will discuss the results of your test with you.  SEEK IMMEDIATE MEDICAL ATTENTION IF ANY OF THE FOLLOWING OCCUR:  Excessive nausea (feeling sick to your stomach) and/or vomiting.   Severe abdominal pain and distention (swelling).   Trouble swallowing.   Temperature over 101 F (37.8 C).   Rectal bleeding or vomiting of blood.    Office followup in 6 months as planned  Further recommendations to follow pending review of pathology report

## 2013-10-24 NOTE — Op Note (Signed)
[facility] [address] [place], [address]   ENDOSCOPY PROCEDURE REPORT  PATIENT: [name], [name]  MR#: [id number] BIRTHDATE: 1955/12/01 , 57  yrs. old GENDER: Female ENDOSCOPIST: R.  Garfield Cornea, MD FACP [facility] REFERRED BY:  Sinda Du, M.D. PROCEDURE DATE:  10/24/2013 PROCEDURE:     EGD with gastric biopsy  INDICATIONS:     screen for esophageal varices  INFORMED CONSENT:   The risks, benefits, limitations, alternatives and imponderables have been discussed.  The potential for biopsy, esophogeal dilation, etc. have also been reviewed.  Questions have been answered.  All parties agreeable.  Please see the history and physical in the medical record for more information.  MEDICATIONS:    Versed 6 mg IV and Demerol 150 mg IV in divided doses. Xylocaine gel orally. Zofran 4 mg IV  DESCRIPTION OF PROCEDURE:   The EG-[id number] ([id number])  endoscope was introduced through the mouth and advanced to the second portion of the duodenum without difficulty or limitations.  The mucosal surfaces were surveyed very carefully during advancement of the scope and upon withdrawal.  Retroflexion view of the proximal stomach and esophagogastric junction was performed.      FINDINGS: Normal esophagus. No varices. Esophagogastric junction was made up of 2 independent openings into a common gastric cavity. Please see abovephotos.  Gastric cavity surgically altered;  patchy intense erythema ad quite a bit of bile-stained mucus present. Antrum appeared surgically altered as well. No ulcer or infiltrating process. No varices. Pylorus patent. Examination of the efferent  small bowel limb carried out a good 20-30 cm. This segment of the GI tract appears normal as well.  THERAPEUTIC / DIAGNOSTIC MANEUVERS PERFORMED:  Biopsies of the abnormal-appearing gastric mucosa taken for histologic study   COMPLICATIONS:  None  IMPRESSION:  Normal esophagus. Surgically altered stomach.  Abnormal gastric mucosa  -  status post biopsy  RECOMMENDATIONS:  Followup on pathology. Repeat screening EGD in 2 years. Office followup as planned in 6 months    _______________________________ R. Garfield Cornea, MD FACP [facility] eSigned:  R. Garfield Cornea, MD FACP [facility] 10/24/2013 2:17 PM     CC:

## 2013-10-24 NOTE — Interval H&P Note (Signed)
History and Physical Interval Note:  10/24/2013 1:42 PM  RUDOLPH DOBLER  has presented today for surgery, with the diagnosis of CIRRHOSIS, SCREENING FOR VARICES  The various methods of treatment have been discussed with the patient and family. After consideration of risks, benefits and other options for treatment, the patient has consented to  Procedure(s) with comments: ESOPHAGOGASTRODUODENOSCOPY (EGD) (N/A) - 1:15-moved to [address] to notify pt as a surgical intervention .  The patient's history has been reviewed, patient examined, no change in status, stable for surgery.  I have reviewed the patient's chart and labs.  Questions were answered to the patient's satisfaction.     Kerianne Gurr  No change. EGD per plan.The risks, benefits, limitations, alternatives and imponderables have been reviewed with the patient. Potential for esophageal dilation, biopsy, etc. have also been reviewed.  Questions have been answered. All parties agreeable.

## 2013-10-24 NOTE — H&P (View-Only) (Signed)
  Referring Provider: Hawkins, Edward L, MD Primary Care Physician:  HAWKINS,EDWARD L, MD Primary GI: Dr. Rourk   Chief Complaint  Patient presents with  . Follow-up    HPI:   [name] presents today with a history of colonic inertia, managed with Linzess. Screening colonoscopy due 2019. Ifobt negative recently. Last visited noted chronic LLQ dull ache. CT ordered showing likely cirrhosis. Likely a reactive node between caudate lobe of liver and superior portion of pancreas. Right mid abdomen abdominal wall hernia with portion of right transverse colon without concerning features.  Linzess 290 mcg once every other day. No rectal bleeding. LLQ "dull ache", intermittent. Hx of DDD; states if lower back starts hurting, lower abdomen will hurt. No confusion, mental status changes. ERCP in 2011 due to CBD stones, no varices noted at that point.   Blood transfusion 1989. No tattoos, IV drug abuse.    Past Medical History  Diagnosis Date  . Depression   . Bipolar 1 disorder   . Chronic constipation   . Diverticulosis 01/31/2008    colonoscopy Dr Rourk  . Obesity   . Schizoaffective disorder     Mental Health-Dr Dekle  . PUD (peptic ulcer disease) 1988  . Colonic inertia   . Hyperplastic colon polyp   . Diverticula of colon     Past Surgical History  Procedure Laterality Date  . Tonsillectomy    . S/p hysterectomy      partial  . Gastric bypass      with Revision, Dr. Fleishmann  . Wrist surgery    . Foot surgery      left  . Cholecystectomy  08/2010    cholelithiasis; Dr Jenkins  . Colonoscopy  01/31/2008    Dr. Rourk- normal rectum, L sided diverticula, long tortuous colon. hyperplasitic  polyp.  . Ercp w/ sphincterotomy and balloon dilation  08/05/2010    normal ampulla/mild erythema in the gastric remnant without ulcerations or erosions. normal retroflexed view of the cardia/anastomosis normal/distal common bile duct stones s/p extraction and shincterotomy     Current Outpatient Prescriptions  Medication Sig Dispense Refill  . buPROPion (WELLBUTRIN) 75 MG tablet Take 75 mg by mouth daily.       . calcium-vitamin D 250-100 MG-UNIT per tablet Take 1 tablet by mouth daily.       . FLUoxetine (PROZAC) 40 MG capsule Take 40 mg by mouth daily.       . Linaclotide (LINZESS) 290 MCG CAPS capsule Take 1 capsule (290 mcg total) by mouth daily. Take 30 minutes before breakfast  30 capsule  11  . risperidone (RISPERDAL) 4 MG tablet Take 4 mg by mouth daily.       . traZODone (DESYREL) 100 MG tablet Take 100 mg by mouth at bedtime.        No current facility-administered medications for this visit.    Allergies as of 10/16/2013 - Review Complete 10/16/2013  Allergen Reaction Noted  . Ciprofloxacin Rash     Family History  Problem Relation Age of Onset  . Colon cancer Maternal Uncle 49  . Diabetes Father   . Liver disease Neg Hx     History   Social History  . Marital Status: Divorced    Spouse Name: N/A    Number of Children: 2  . Years of Education: N/A   Occupational History  . disabled    Social History Main Topics  . Smoking status: Former Smoker -- 1.00 packs/day for 30 years      Types: Cigarettes    Quit date: 09/05/2004  . Smokeless tobacco: Former Systems developer     Comment: Quit smoking x 10 years  . Alcohol Use: No  . Drug Use: No  . Sexual Activity: None   Other Topics Concern  . None   Social History Narrative   Lives w/ mother   2 grown children    Review of Systems: Negative unless mentioned in HPI.   Physical Exam: BP 90/52  Pulse 59  Temp(Src) 96.4 F (35.8 C) (Oral)  Ht 5\' 4"  (1.626 m)  Wt 189 lb (85.73 kg)  BMI 32.43 kg/m2 General:   Alert and oriented. No distress noted. Pleasant and cooperative.  Head:  Normocephalic and atraumatic. Eyes:  Conjuctiva clear without scleral icterus. Mouth:  Oral mucosa pink and moist.  Heart:  S1, S2 present without murmurs, rubs, or gallops. Regular rate and  rhythm. Abdomen:  +BS, soft, non-tender and non-distended. No obvious HSM. Right ventral hernia Msk:  Symmetrical without gross deformities. Normal posture. Extremities:  Without edema. Neurologic:  Alert and  oriented x4;  grossly normal neurologically. Skin:  Intact without significant lesions or rashes. Cervical Nodes:  No significant cervical adenopathy. Psych:  Alert and cooperative. Normal mood and affect.  Lab Results  Component Value Date   WBC 9.2 08/08/2013   HGB 13.7 08/08/2013   HCT 40.7 08/08/2013   MCV 87.9 08/08/2013   PLT 457* 08/08/2013   Lab Results  Component Value Date   ALT 13 08/08/2013   AST 14 08/08/2013   ALKPHOS 64 08/08/2013   BILITOT 0.4 08/08/2013

## 2013-10-28 ENCOUNTER — Encounter (HOSPITAL_COMMUNITY): Payer: Self-pay | Admitting: Internal Medicine

## 2013-10-28 LAB — PROTIME-INR
INR: 1.02 (ref <1.50)
Prothrombin Time: 13.3 seconds (ref 11.6–15.2)

## 2013-10-28 LAB — HEPATITIS C ANTIBODY: HCV Ab: NEGATIVE

## 2013-10-28 LAB — HEPATITIS B SURFACE ANTIGEN: Hepatitis B Surface Ag: NEGATIVE

## 2013-10-29 LAB — MITOCHONDRIAL ANTIBODIES: Mitochondrial M2 Ab, IgG: 0.22 (ref <0.91)

## 2013-10-29 LAB — ANA: Anti Nuclear Antibody(ANA): NEGATIVE

## 2013-10-29 LAB — IGG, IGA, IGM
IgA: 192 mg/dL (ref 69–380)
IgG (Immunoglobin G), Serum: 855 mg/dL (ref 690–1700)
IgM, Serum: 32 mg/dL — ABNORMAL LOW (ref 52–322)

## 2013-10-30 ENCOUNTER — Encounter: Payer: Self-pay | Admitting: Internal Medicine

## 2013-10-30 LAB — ANTI-SMOOTH MUSCLE ANTIBODY, IGG: Smooth Muscle Ab: 9 U (ref <20)

## 2013-11-13 NOTE — Progress Notes (Signed)
Quick Note:  MELD 8, well-compensated. Labs negative for autoimmune, PBC, viral etiology.  Likely secondary to NASH.  Needs to return in July 2015 for cirrhosis follow-up. Will need [place] at that time. ______

## 2013-11-23 ENCOUNTER — Telehealth: Payer: Self-pay | Admitting: Gastroenterology

## 2013-12-02 NOTE — Telephone Encounter (Signed)
Reminder appt already made for August 2015

## 2013-12-05 NOTE — Telephone Encounter (Signed)
Appt

## 2014-04-08 ENCOUNTER — Encounter: Payer: Self-pay | Admitting: Gastroenterology

## 2014-04-08 ENCOUNTER — Telehealth: Payer: Self-pay | Admitting: Internal Medicine

## 2014-04-08 ENCOUNTER — Encounter: Payer: Self-pay | Admitting: Internal Medicine

## 2014-04-08 NOTE — Telephone Encounter (Signed)
Pt is on the AUG recall for follow up cirrhosis and will need U/S at that time. Do we need to schedule U/S prior to OV?

## 2014-04-08 NOTE — Telephone Encounter (Signed)
Yes Letter has been mailed to patient

## 2014-04-11 ENCOUNTER — Other Ambulatory Visit: Payer: Self-pay | Admitting: Internal Medicine

## 2014-04-11 DIAGNOSIS — K769 Liver disease, unspecified: Secondary | ICD-10-CM

## 2014-04-11 DIAGNOSIS — K869 Disease of pancreas, unspecified: Secondary | ICD-10-CM

## 2014-04-15 ENCOUNTER — Ambulatory Visit (HOSPITAL_COMMUNITY)
Admission: RE | Admit: 2014-04-15 | Discharge: 2014-04-15 | Disposition: A | Discharge status: Home / Self Care | Payer: PRIVATE HEALTH INSURANCE | Source: Ambulatory Visit | Attending: Internal Medicine | Admitting: Internal Medicine

## 2014-04-15 DIAGNOSIS — K7689 Other specified diseases of liver: Secondary | ICD-10-CM | POA: Diagnosis present

## 2014-04-15 DIAGNOSIS — K869 Disease of pancreas, unspecified: Secondary | ICD-10-CM | POA: Diagnosis present

## 2014-04-15 DIAGNOSIS — K769 Liver disease, unspecified: Secondary | ICD-10-CM

## 2014-05-15 ENCOUNTER — Ambulatory Visit (INDEPENDENT_AMBULATORY_CARE_PROVIDER_SITE_OTHER): Payer: Commercial Managed Care - HMO | Admitting: Gastroenterology

## 2014-05-15 ENCOUNTER — Encounter: Payer: Self-pay | Admitting: Gastroenterology

## 2014-05-15 VITALS — BP 94/60 | HR 79 | Temp 98.3°F | Ht 64.0 in | Wt 189.2 lb

## 2014-05-15 DIAGNOSIS — K7460 Unspecified cirrhosis of liver: Secondary | ICD-10-CM

## 2014-05-15 DIAGNOSIS — F259 Schizoaffective disorder, unspecified: Secondary | ICD-10-CM

## 2014-05-15 DIAGNOSIS — K5900 Constipation, unspecified: Secondary | ICD-10-CM

## 2014-05-15 MED ORDER — POLYETHYLENE GLYCOL 3350 17 GM/SCOOP PO POWD: ORAL | Status: DC

## 2014-05-15 NOTE — Patient Instructions (Signed)
1. Please have your bloodwork and ultrasound done. 2. Continue Linzess one daily. Add miralax one capful daily.

## 2014-05-15 NOTE — Progress Notes (Signed)
      Primary Care Physician: Alonza Bogus, MD  Primary Gastroenterologist:  Garfield Cornea, MD   Chief Complaint  Patient presents with  . Follow-up    Doing well    HPI: [name] is a 58 y.o. female here for six month f/u of cirrhosis, colonic inertia. Screening colonoscopy due 2019. EGD 10/2013, no varices, +reactive gastropathy. Earlier this year, CT showed incidental cirrhosis. CT did not support fatty liver. Finished hepatitis vaccinations for A/B X 3. No FH liver disease. Feels well. Denies abdominal pain. Only has one BM per week on Linzess. No straining. No melena, brbpr. Denies confusion, tremors.   Abdominal ultrasound performed August 2015, liver reported to be normal. Pancreas appeared normal. Just superior to the pancreas and adjacent to the caudate lobe of the liver there was a 6.4 x 3.2 cm hypoechoic mass corresponding to that seen on prior examinations. Thought to represent chronic lymphadenopathy. Six-month followup ultrasound planned for hepatoma surveillance as well. Liver workup has included autoimmune, PBC, hepatitis B and C serologies.  Current Outpatient Prescriptions  Medication Sig Dispense Refill  . buPROPion (WELLBUTRIN) 75 MG tablet Take 75 mg by mouth daily.       . calcium-vitamin D 250-100 MG-UNIT per tablet Take 1 tablet by mouth daily.       Marland Kitchen FLUoxetine (PROZAC) 40 MG capsule Take 40 mg by mouth daily.       . Linaclotide (LINZESS) 290 MCG CAPS capsule Take 1 capsule (290 mcg total) by mouth daily. Take 30 minutes before breakfast  30 capsule  11  . risperidone (RISPERDAL) 4 MG tablet Take 4 mg by mouth daily.       . traZODone (DESYREL) 100 MG tablet Take 100 mg by mouth at bedtime.        No current facility-administered medications for this visit.    Allergies as of 05/15/2014 - Review Complete 05/15/2014  Allergen Reaction Noted  . Ciprofloxacin Rash     ROS:  General: Negative for anorexia, weight loss, fever, chills, fatigue,  weakness. ENT: Negative for hoarseness, difficulty swallowing , nasal congestion. CV: Negative for chest pain, angina, palpitations, dyspnea on exertion, peripheral edema.  Respiratory: Negative for dyspnea at rest, dyspnea on exertion, cough, sputum, wheezing.  GI: See history of present illness. GU:  Negative for dysuria, hematuria, urinary incontinence, urinary frequency, nocturnal urination.  Endo: Negative for unusual weight change.    Physical Examination:   BP 94/60  Pulse 79  Temp(Src) 98.3 F (36.8 C) (Oral)  Ht 5\' 4"  (1.626 m)  Wt 189 lb 3.2 oz (85.821 kg)  BMI 32.46 kg/m2  General: Well-nourished, well-developed in no acute distress.  Eyes: No icterus. Mouth: Oropharyngeal mucosa moist and pink , no lesions erythema or exudate. Lungs: Clear to auscultation bilaterally.  Heart: Regular rate and rhythm, no murmurs rubs or gallops.  Abdomen: Bowel sounds are normal, nontender, nondistended, no hepatosplenomegaly or masses, no abdominal bruits or hernia , no rebound or guarding.   Extremities: No lower extremity edema. No clubbing or deformities. Neuro: Alert and oriented x 4   Skin: Warm and dry, no jaundice.   Psych: Alert and cooperative, normal mood and affect.

## 2014-05-15 NOTE — Assessment & Plan Note (Addendum)
Doing well clinically. Up to date on hepatoma screening and received Hep A and B vaccines. Will check for hemochromatosis, Wilson's for completeness. OV in 6 months.

## 2014-05-15 NOTE — Assessment & Plan Note (Signed)
Continue Linzess 261mcg daily. Add miralax 17g daily. Patient already tried [place] and states it stopped working for her. Increase daily fiber.

## 2014-05-15 NOTE — Progress Notes (Signed)
cc'ed to pcp °

## 2014-05-19 LAB — COMPREHENSIVE METABOLIC PANEL
ALT: 19 U/L (ref 0–35)
AST: 15 U/L (ref 0–37)
Albumin: 4.2 g/dL (ref 3.5–5.2)
Alkaline Phosphatase: 70 U/L (ref 39–117)
BUN: 18 mg/dL (ref 6–23)
CO2: 25 mEq/L (ref 19–32)
Calcium: 10 mg/dL (ref 8.4–10.5)
Chloride: 103 mEq/L (ref 96–112)
Creat: 1.12 mg/dL — ABNORMAL HIGH (ref 0.50–1.10)
Glucose, Bld: 77 mg/dL (ref 70–99)
Potassium: 4.3 mEq/L (ref 3.5–5.3)
Sodium: 139 mEq/L (ref 135–145)
Total Bilirubin: 0.5 mg/dL (ref 0.2–1.2)
Total Protein: 6.5 g/dL (ref 6.0–8.3)

## 2014-05-19 LAB — CBC WITH DIFFERENTIAL/PLATELET
Basophils Absolute: 0.1 10*3/uL (ref 0.0–0.1)
Basophils Relative: 1 % (ref 0–1)
Eosinophils Absolute: 0.2 10*3/uL (ref 0.0–0.7)
Eosinophils Relative: 3 % (ref 0–5)
HCT: 38.7 % (ref 36.0–46.0)
Hemoglobin: 13.2 g/dL (ref 12.0–15.0)
Lymphocytes Relative: 25 % (ref 12–46)
Lymphs Abs: 1.8 10*3/uL (ref 0.7–4.0)
MCH: 29.9 pg (ref 26.0–34.0)
MCHC: 34.1 g/dL (ref 30.0–36.0)
MCV: 87.8 fL (ref 78.0–100.0)
Monocytes Absolute: 0.7 10*3/uL (ref 0.1–1.0)
Monocytes Relative: 10 % (ref 3–12)
Neutro Abs: 4.3 10*3/uL (ref 1.7–7.7)
Neutrophils Relative %: 61 % (ref 43–77)
Platelets: 353 10*3/uL (ref 150–400)
RBC: 4.41 MIL/uL (ref 3.87–5.11)
RDW: 13.4 % (ref 11.5–15.5)
WBC: 7 10*3/uL (ref 4.0–10.5)

## 2014-05-19 LAB — IRON AND TIBC
%SAT: 27 % (ref 20–55)
Iron: 80 ug/dL (ref 42–145)
TIBC: 291 ug/dL (ref 250–470)
UIBC: 211 ug/dL (ref 125–400)

## 2014-05-19 LAB — FERRITIN: Ferritin: 77 ng/mL (ref 10–291)

## 2014-05-19 LAB — PROTIME-INR
INR: 1.01 (ref <1.50)
Prothrombin Time: 13.3 seconds (ref 11.6–15.2)

## 2014-05-21 LAB — CERULOPLASMIN: Ceruloplasmin: 27 mg/dL (ref 18–53)

## 2014-05-28 NOTE — Progress Notes (Signed)
Quick Note:  Child Pugh Class A and MELD 5. Good hepatic function.  No evidence of Wilsons or hemochromatosis.  Other labs including CBC are normal.  Plan as outlined at time of OV. ______

## 2014-06-20 ENCOUNTER — Emergency Department (HOSPITAL_COMMUNITY)
Admission: EM | Admit: 2014-06-20 | Discharge: 2014-06-20 | Disposition: A | Discharge status: Home / Self Care | Payer: No Typology Code available for payment source | Attending: Emergency Medicine | Admitting: Emergency Medicine

## 2014-06-20 ENCOUNTER — Encounter (HOSPITAL_COMMUNITY): Payer: Self-pay | Admitting: Emergency Medicine

## 2014-06-20 ENCOUNTER — Emergency Department (HOSPITAL_COMMUNITY): Payer: No Typology Code available for payment source

## 2014-06-20 DIAGNOSIS — Z79899 Other long term (current) drug therapy: Secondary | ICD-10-CM | POA: Insufficient documentation

## 2014-06-20 DIAGNOSIS — K5900 Constipation, unspecified: Secondary | ICD-10-CM | POA: Insufficient documentation

## 2014-06-20 DIAGNOSIS — S199XXA Unspecified injury of neck, initial encounter: Secondary | ICD-10-CM | POA: Diagnosis present

## 2014-06-20 DIAGNOSIS — F329 Major depressive disorder, single episode, unspecified: Secondary | ICD-10-CM | POA: Insufficient documentation

## 2014-06-20 DIAGNOSIS — Z87891 Personal history of nicotine dependence: Secondary | ICD-10-CM | POA: Insufficient documentation

## 2014-06-20 DIAGNOSIS — E669 Obesity, unspecified: Secondary | ICD-10-CM | POA: Insufficient documentation

## 2014-06-20 DIAGNOSIS — F259 Schizoaffective disorder, unspecified: Secondary | ICD-10-CM | POA: Insufficient documentation

## 2014-06-20 DIAGNOSIS — Y9389 Activity, other specified: Secondary | ICD-10-CM | POA: Insufficient documentation

## 2014-06-20 DIAGNOSIS — Z86010 Personal history of colonic polyps: Secondary | ICD-10-CM | POA: Insufficient documentation

## 2014-06-20 DIAGNOSIS — S161XXA Strain of muscle, fascia and tendon at neck level, initial encounter: Secondary | ICD-10-CM | POA: Diagnosis not present

## 2014-06-20 DIAGNOSIS — Y92410 Unspecified street and highway as the place of occurrence of the external cause: Secondary | ICD-10-CM | POA: Insufficient documentation

## 2014-06-20 MED ORDER — IBUPROFEN 800 MG PO TABS
800.0000 mg | ORAL_TABLET | Freq: Once | ORAL | Status: AC
  Administered 2014-06-20: 800 mg via ORAL
  Filled 2014-06-20: qty 1

## 2014-06-20 MED ORDER — NAPROXEN 500 MG PO TABS: 500.0000 mg | ORAL_TABLET | Freq: Two times a day (BID) | ORAL | Status: DC

## 2014-06-20 MED ORDER — CYCLOBENZAPRINE HCL 5 MG PO TABS: 5.0000 mg | ORAL_TABLET | Freq: Three times a day (TID) | ORAL | Status: DC | PRN

## 2014-06-20 NOTE — ED Notes (Addendum)
c-collar placed in triage. Pt placed in wheelchair as well.
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
## 2014-06-20 NOTE — ED Provider Notes (Signed)
Medical screening examination/treatment/procedure(s) were performed by non-physician practitioner and as supervising physician I was immediately available for consultation/collaboration.   EKG Interpretation None        Ezequiel Essex, MD 06/20/14 [phone]

## 2014-07-18 ENCOUNTER — Other Ambulatory Visit: Payer: Self-pay | Admitting: Gastroenterology

## 2014-10-07 ENCOUNTER — Telehealth: Payer: Self-pay | Admitting: Internal Medicine

## 2014-10-07 ENCOUNTER — Other Ambulatory Visit: Payer: Self-pay

## 2014-10-07 DIAGNOSIS — K769 Liver disease, unspecified: Secondary | ICD-10-CM

## 2014-10-07 DIAGNOSIS — H2513 Age-related nuclear cataract, bilateral: Secondary | ICD-10-CM | POA: Diagnosis not present

## 2014-10-07 DIAGNOSIS — K869 Disease of pancreas, unspecified: Secondary | ICD-10-CM

## 2014-10-07 NOTE — Telephone Encounter (Signed)
Letter mailed with appointment for [place] on 10/16/14 @ 9:45

## 2014-10-07 NOTE — Telephone Encounter (Signed)
ON FEB RECALL FOR ULTRASOUND °

## 2014-10-16 ENCOUNTER — Ambulatory Visit (HOSPITAL_COMMUNITY)
Admission: RE | Admit: 2014-10-16 | Discharge: 2014-10-16 | Disposition: A | Discharge status: Home / Self Care | Payer: Medicare Other | Source: Ambulatory Visit | Attending: Internal Medicine | Admitting: Internal Medicine

## 2014-10-16 DIAGNOSIS — K7689 Other specified diseases of liver: Secondary | ICD-10-CM | POA: Diagnosis not present

## 2014-10-16 DIAGNOSIS — Z9884 Bariatric surgery status: Secondary | ICD-10-CM | POA: Insufficient documentation

## 2014-10-16 DIAGNOSIS — K869 Disease of pancreas, unspecified: Secondary | ICD-10-CM | POA: Diagnosis not present

## 2014-10-16 DIAGNOSIS — Z9049 Acquired absence of other specified parts of digestive tract: Secondary | ICD-10-CM | POA: Diagnosis not present

## 2014-10-16 DIAGNOSIS — E662 Morbid (severe) obesity with alveolar hypoventilation: Secondary | ICD-10-CM | POA: Diagnosis not present

## 2014-10-16 DIAGNOSIS — K279 Peptic ulcer, site unspecified, unspecified as acute or chronic, without hemorrhage or perforation: Secondary | ICD-10-CM | POA: Diagnosis not present

## 2014-10-16 DIAGNOSIS — Z8711 Personal history of peptic ulcer disease: Secondary | ICD-10-CM | POA: Insufficient documentation

## 2014-10-16 DIAGNOSIS — K769 Liver disease, unspecified: Secondary | ICD-10-CM

## 2014-10-30 ENCOUNTER — Telehealth: Payer: Self-pay | Admitting: Internal Medicine

## 2014-10-30 NOTE — Telephone Encounter (Signed)
I reviewed last couple of ultrasounds. No change. Pancreas poorly seen. Localized abnormality in caudate lobe of liver unchanged. I think it would be best to get an MRI of the liver/pancreas just to make sure that there is nothing underlying these chronic findings

## 2014-10-30 NOTE — Telephone Encounter (Signed)
Routing to RMR for results. 

## 2014-10-30 NOTE — Telephone Encounter (Signed)
PATIENT CALLED INQUIRING ABOUT ULTRASOUND RESULTS

## 2014-11-02 NOTE — Progress Notes (Signed)
Quick Note:  Discussed with Dr. Gala Romney. Please let patient know findings are stable with liver and the structure previously locating between the liver and pancreas (previously thought to be a lymph node). Dr. Gala Romney recommends imaging closer with more detailed study.  Please arrange for CT Abd with contrast (Dx cirrhosis, evaluate mass near liver/pancreas). She will need update labs: CMET, LFTS, PT/INR, CBC Due OV with RMR in 11/2014. ______

## 2014-11-04 ENCOUNTER — Other Ambulatory Visit: Payer: Self-pay | Admitting: Gastroenterology

## 2014-11-04 ENCOUNTER — Encounter: Payer: Self-pay | Admitting: Internal Medicine

## 2014-11-04 ENCOUNTER — Other Ambulatory Visit: Payer: Self-pay

## 2014-11-04 DIAGNOSIS — K7460 Unspecified cirrhosis of liver: Secondary | ICD-10-CM

## 2014-11-04 NOTE — Telephone Encounter (Signed)
Pt is aware of results. OK to schedule MRI.

## 2014-11-04 NOTE — Progress Notes (Signed)
APPOINTMENT MADE AND LETTER SENT °

## 2014-11-05 ENCOUNTER — Other Ambulatory Visit: Payer: Self-pay

## 2014-11-05 DIAGNOSIS — K7460 Unspecified cirrhosis of liver: Secondary | ICD-10-CM

## 2014-11-05 DIAGNOSIS — R1032 Left lower quadrant pain: Secondary | ICD-10-CM

## 2014-11-05 NOTE — Telephone Encounter (Signed)
Insurance is pending

## 2014-11-12 ENCOUNTER — Other Ambulatory Visit: Payer: Self-pay

## 2014-11-12 ENCOUNTER — Telehealth: Payer: Self-pay | Admitting: Gastroenterology

## 2014-11-12 NOTE — Telephone Encounter (Signed)
Called pt and and she is aware if MRI appt. On 03/17@ 1045am.

## 2014-11-12 NOTE — Telephone Encounter (Signed)
Peer-to-peer review done for MRI abdomen with and without contrast. Approval number: [id number] CPT code [id number] Expires 12/27/2014

## 2014-11-12 NOTE — Telephone Encounter (Signed)
Approved see other phone note

## 2014-11-20 ENCOUNTER — Ambulatory Visit (HOSPITAL_COMMUNITY)
Admission: RE | Admit: 2014-11-20 | Discharge: 2014-11-20 | Disposition: A | Discharge status: Home / Self Care | Payer: Medicare Other | Source: Ambulatory Visit | Attending: Internal Medicine | Admitting: Internal Medicine

## 2014-11-20 DIAGNOSIS — Z9884 Bariatric surgery status: Secondary | ICD-10-CM | POA: Diagnosis not present

## 2014-11-20 DIAGNOSIS — K7460 Unspecified cirrhosis of liver: Secondary | ICD-10-CM

## 2014-11-20 DIAGNOSIS — G8929 Other chronic pain: Secondary | ICD-10-CM | POA: Insufficient documentation

## 2014-11-20 DIAGNOSIS — R1032 Left lower quadrant pain: Secondary | ICD-10-CM | POA: Diagnosis present

## 2014-11-20 DIAGNOSIS — K5730 Diverticulosis of large intestine without perforation or abscess without bleeding: Secondary | ICD-10-CM | POA: Diagnosis not present

## 2014-11-20 MED ORDER — GADOBENATE DIMEGLUMINE 529 MG/ML IV SOLN
19.0000 mL | Freq: Once | INTRAVENOUS | Status: AC | PRN
  Administered 2014-11-20: 19 mL via INTRAVENOUS

## 2014-12-03 ENCOUNTER — Telehealth: Payer: Self-pay

## 2014-12-03 NOTE — Telephone Encounter (Signed)
Error

## 2014-12-12 ENCOUNTER — Other Ambulatory Visit: Payer: Self-pay | Admitting: Internal Medicine

## 2014-12-12 ENCOUNTER — Ambulatory Visit (INDEPENDENT_AMBULATORY_CARE_PROVIDER_SITE_OTHER): Payer: Medicare Other | Admitting: Internal Medicine

## 2014-12-12 ENCOUNTER — Encounter: Payer: Self-pay | Admitting: Internal Medicine

## 2014-12-12 VITALS — BP 132/79 | HR 53 | Temp 97.6°F | Ht 64.0 in | Wt 201.6 lb

## 2014-12-12 DIAGNOSIS — K5900 Constipation, unspecified: Secondary | ICD-10-CM

## 2014-12-12 DIAGNOSIS — K7460 Unspecified cirrhosis of liver: Secondary | ICD-10-CM | POA: Diagnosis not present

## 2014-12-12 NOTE — Progress Notes (Signed)
Primary Care Physician:  Alonza Bogus, MD Primary Gastroenterologist:  Dr. Gala Romney  Pre-Procedure History & Physical: HPI:  [name] is a 59 y.o. female here for follow-up for cirrhosis. The MRI demonstrated a stable lesion between the pancreas and the liver most consistent with a lymph node. Stable over time. Also, a  soft tissue mass at the upper extent of the reaches of the abdominal MRI. Of note, the liver parenchyma, itself, really looks good and in fact based on MR criteria pretty much looks normal. Linzess  Working well for constipation. Patient has no complaints today.  Screening for hemochromatosis Wilson's disease came back negative.  Patient does endorse a cyst on her left upper abdominal/lower chest wall which been present for years.    Past Medical History  Diagnosis Date  . Depression   . Chronic constipation   . Diverticulosis 01/31/2008    colonoscopy Dr Gala Romney  . Obesity   . Schizoaffective disorder     Mental Health-Dr Elias Else  . PUD (peptic ulcer disease) 1988  . Colonic inertia   . Hyperplastic colon polyp   . Diverticula of colon   . Cirrhosis     child pugh class A and MELD 5, completed hep A and B vaccine series, no HCC on 3/16 MRI    Past Surgical History  Procedure Laterality Date  . Tonsillectomy    . S/p hysterectomy      partial  . Gastric bypass      with Revision, Dr. Duffy Rhody  . Wrist surgery    . Foot surgery      left  . Cholecystectomy  08/2010    cholelithiasis; Dr Arnoldo Morale  . Colonoscopy  01/31/2008    Dr. Gala Romney- normal rectum, L sided diverticula, long tortuous colon. hyperplasitic  polyp.  . Ercp w/ sphincterotomy and balloon dilation  08/05/2010    normal ampulla/mild erythema in the gastric remnant without ulcerations or erosions. normal retroflexed view of the cardia/anastomosis normal/distal common bile duct stones s/p extraction and shincterotomy  . Esophagogastroduodenoscopy N/A 10/24/2013    VVO:HYWVPX esophagus.  Surgically altered stomach. Abnormal gastric mucosa  -  status post biopsy (reactive gastrophathy)    Prior to Admission medications   Medication Sig Start Date End Date Taking? Authorizing Provider  buPROPion (WELLBUTRIN) 75 MG tablet Take 75 mg by mouth daily.    Yes Historical Provider, MD  calcium-vitamin D 250-100 MG-UNIT per tablet Take 1 tablet by mouth daily.    Yes Historical Provider, MD  FLUoxetine (PROZAC) 40 MG capsule Take 40 mg by mouth daily.  06/06/12  Yes Historical Provider, MD  LINZESS 290 MCG CAPS capsule TAKE ONE CAPSULE BY MOUTH DAILY 30 MINUTES BEFORE BREAKFAST. 07/18/14  Yes Mahala Menghini, PA-C  polyethylene glycol powder (GLYCOLAX/MIRALAX) powder One capful daily. 05/15/14  Yes Mahala Menghini, PA-C  risperidone (RISPERDAL) 4 MG tablet Take 4 mg by mouth daily.  06/06/12  Yes Historical Provider, MD  traZODone (DESYREL) 100 MG tablet Take 100 mg by mouth at bedtime.    Yes Historical Provider, MD  cyclobenzaprine (FLEXERIL) 5 MG tablet Take 1 tablet (5 mg total) by mouth 3 (three) times daily as needed for muscle spasms. Patient not taking: Reported on 12/12/2014 06/20/14   [facility] Bunnie Pion, NP  naproxen (NAPROSYN) 500 MG tablet Take 1 tablet (500 mg total) by mouth 2 (two) times daily. Patient not taking: Reported on 12/12/2014 06/20/14   Ashley Murrain, NP    Allergies as of  12/12/2014 - Review Complete 12/12/2014  Allergen Reaction Noted  . Ciprofloxacin Rash     Family History  Problem Relation Age of Onset  . Colon cancer Maternal Uncle 107  . Diabetes Father   . Liver disease Neg Hx     History   Social History  . Marital Status: Divorced    Spouse Name: N/A  . Number of Children: 2  . Years of Education: N/A   Occupational History  . disabled    Social History Main Topics  . Smoking status: Former Smoker -- 1.00 packs/day for 30 years    Types: Cigarettes    Quit date: 09/05/2004  . Smokeless tobacco: Former Systems developer     Comment: Quit smoking x 10 years    . Alcohol Use: No  . Drug Use: No  . Sexual Activity: Not on file   Other Topics Concern  . Not on file   Social History Narrative   Lives w/ mother   2 grown children    Review of Systems: See HPI, otherwise negative ROS  Physical Exam: BP 132/79 mmHg  Pulse 53  Temp(Src) 97.6 F (36.4 C) (Oral)  Ht 5\' 4"  (1.626 m)  Wt 201 lb 9.6 oz (91.445 kg)  BMI 34.59 kg/m2 General:   Alert,  Well-developed, well-nourished, pleasant and cooperative in NAD Skin:  Intact without significant lesions or rashes. Eyes:  Sclera clear, no icterus.   Conjunctiva pink. Ears:  Normal auditory acuity. Nose:  No deformity, discharge,  or lesions. Mouth:  No deformity or lesions. Neck:  Supple; no masses or thyromegaly. No significant cervical adenopathy. Lungs:  Clear throughout to auscultation.   No wheezes, crackles, or rhonchi. No acute distress. Heart:  Regular rate and rhythm; no murmurs, clicks, rubs,  or gallops. Abdomen: Non-distended, normal bowel sounds.  Soft and nontender without appreciable mass or hepatosplenomegaly.  She does have a cyst appreciable; nontender to palpation just over her left lower costal margin medially. It's nontender. Pulses:  Normal pulses noted. Extremities:  Without clubbing or edema.  Impression:  59 year old lady with a diagnosis of cirrhosis he has a very good-looking liver parenchyma on MRI. She has a load meld score. She has very good preserved hepatic synthetic function. Stable intra-abdominal lesion likely represent a lymph node-do not recommend further evaluation. Likely sebaceous cyst on her anterior abdominal/chest wall seen on CT.  Certainly, an element of fatty liver is likely. Elastography would be a good idea to get an additional data point.   Recommendations: Continue Linzess daily  Ultrasound with elastography in September of this year  Hepatic profile in 6 months  Regular exercise  Weight loss encouraged  Office visit in 9  months    Notice: This dictation was prepared with Dragon dictation along with smaller phrase technology. Any transcriptional errors that result from this process are unintentional and may not be corrected upon review.

## 2014-12-12 NOTE — Patient Instructions (Addendum)
Continue Linzess daily  Ultrasound with elastography in September of this year  Hepatic profile in 6 months  Regular exercise  Weight loss encouraged  Office visit in 9 months

## 2015-04-07 ENCOUNTER — Telehealth: Payer: Self-pay | Admitting: Internal Medicine

## 2015-04-07 NOTE — Telephone Encounter (Signed)
Letter mailed for her to call us 

## 2015-04-07 NOTE — Telephone Encounter (Signed)
SWPTEMBER RECALL FOR ULTRASOUND WITH ELASTO

## 2015-04-13 ENCOUNTER — Telehealth: Payer: Self-pay | Admitting: Internal Medicine

## 2015-04-13 ENCOUNTER — Other Ambulatory Visit: Payer: Self-pay

## 2015-04-13 DIAGNOSIS — K7460 Unspecified cirrhosis of liver: Secondary | ICD-10-CM

## 2015-04-13 NOTE — Telephone Encounter (Signed)
Pt is aware.  

## 2015-04-13 NOTE — Telephone Encounter (Signed)
Pt is set for [place] on 08/15 @ 8:15am

## 2015-04-13 NOTE — Telephone Encounter (Signed)
Pt called to say she received a letter to call the office and set up her U/S. Please call her at [phone]

## 2015-04-20 ENCOUNTER — Ambulatory Visit (HOSPITAL_COMMUNITY)
Admission: RE | Admit: 2015-04-20 | Discharge: 2015-04-20 | Disposition: A | Discharge status: Home / Self Care | Payer: Medicare Other | Source: Ambulatory Visit | Attending: Internal Medicine | Admitting: Internal Medicine

## 2015-04-20 DIAGNOSIS — Z9049 Acquired absence of other specified parts of digestive tract: Secondary | ICD-10-CM | POA: Diagnosis not present

## 2015-04-20 DIAGNOSIS — K7460 Unspecified cirrhosis of liver: Secondary | ICD-10-CM | POA: Diagnosis not present

## 2015-04-20 DIAGNOSIS — R590 Localized enlarged lymph nodes: Secondary | ICD-10-CM | POA: Diagnosis not present

## 2015-04-22 NOTE — Progress Notes (Signed)
ON RECALL  °

## 2015-05-15 ENCOUNTER — Other Ambulatory Visit: Payer: Self-pay

## 2015-05-15 DIAGNOSIS — K7460 Unspecified cirrhosis of liver: Secondary | ICD-10-CM

## 2015-05-25 ENCOUNTER — Telehealth: Payer: Self-pay | Admitting: Internal Medicine

## 2015-05-25 DIAGNOSIS — Z23 Encounter for immunization: Secondary | ICD-10-CM | POA: Diagnosis not present

## 2015-05-25 NOTE — Telephone Encounter (Signed)
Lab are in the box

## 2015-05-25 NOTE — Telephone Encounter (Signed)
October RECALL FOR HEPATIC PROFILE

## 2015-06-09 DIAGNOSIS — K7460 Unspecified cirrhosis of liver: Secondary | ICD-10-CM | POA: Diagnosis not present

## 2015-06-10 LAB — HEPATIC FUNCTION PANEL
ALT: 15 U/L (ref 6–29)
AST: 15 U/L (ref 10–35)
Albumin: 4 g/dL (ref 3.6–5.1)
Alkaline Phosphatase: 76 U/L (ref 33–130)
Bilirubin, Direct: 0.1 mg/dL (ref <=0.2)
Indirect Bilirubin: 0.3 mg/dL (ref 0.2–1.2)
Total Bilirubin: 0.4 mg/dL (ref 0.2–1.2)
Total Protein: 6.5 g/dL (ref 6.1–8.1)

## 2015-07-20 ENCOUNTER — Other Ambulatory Visit: Payer: Self-pay | Admitting: Gastroenterology

## 2015-07-30 ENCOUNTER — Emergency Department (HOSPITAL_COMMUNITY): Payer: Medicare Other

## 2015-07-30 ENCOUNTER — Emergency Department (HOSPITAL_COMMUNITY)
Admission: EM | Admit: 2015-07-30 | Discharge: 2015-07-30 | Disposition: A | Discharge status: Home / Self Care | Payer: Medicare Other | Attending: Emergency Medicine | Admitting: Emergency Medicine

## 2015-07-30 ENCOUNTER — Encounter (HOSPITAL_COMMUNITY): Payer: Self-pay

## 2015-07-30 DIAGNOSIS — Z8711 Personal history of peptic ulcer disease: Secondary | ICD-10-CM | POA: Insufficient documentation

## 2015-07-30 DIAGNOSIS — E669 Obesity, unspecified: Secondary | ICD-10-CM | POA: Insufficient documentation

## 2015-07-30 DIAGNOSIS — R630 Anorexia: Secondary | ICD-10-CM | POA: Diagnosis not present

## 2015-07-30 DIAGNOSIS — Z79899 Other long term (current) drug therapy: Secondary | ICD-10-CM | POA: Insufficient documentation

## 2015-07-30 DIAGNOSIS — F329 Major depressive disorder, single episode, unspecified: Secondary | ICD-10-CM | POA: Diagnosis not present

## 2015-07-30 DIAGNOSIS — R109 Unspecified abdominal pain: Secondary | ICD-10-CM

## 2015-07-30 DIAGNOSIS — Z87891 Personal history of nicotine dependence: Secondary | ICD-10-CM | POA: Insufficient documentation

## 2015-07-30 DIAGNOSIS — K5900 Constipation, unspecified: Secondary | ICD-10-CM | POA: Insufficient documentation

## 2015-07-30 DIAGNOSIS — R1013 Epigastric pain: Secondary | ICD-10-CM | POA: Diagnosis present

## 2015-07-30 DIAGNOSIS — K7689 Other specified diseases of liver: Secondary | ICD-10-CM | POA: Diagnosis not present

## 2015-07-30 DIAGNOSIS — R197 Diarrhea, unspecified: Secondary | ICD-10-CM | POA: Insufficient documentation

## 2015-07-30 DIAGNOSIS — F259 Schizoaffective disorder, unspecified: Secondary | ICD-10-CM | POA: Insufficient documentation

## 2015-07-30 DIAGNOSIS — R112 Nausea with vomiting, unspecified: Secondary | ICD-10-CM | POA: Insufficient documentation

## 2015-07-30 DIAGNOSIS — Z86010 Personal history of colonic polyps: Secondary | ICD-10-CM | POA: Insufficient documentation

## 2015-07-30 DIAGNOSIS — E860 Dehydration: Secondary | ICD-10-CM | POA: Diagnosis not present

## 2015-07-30 DIAGNOSIS — Z9884 Bariatric surgery status: Secondary | ICD-10-CM | POA: Insufficient documentation

## 2015-07-30 DIAGNOSIS — R110 Nausea: Secondary | ICD-10-CM

## 2015-07-30 DIAGNOSIS — Z90710 Acquired absence of both cervix and uterus: Secondary | ICD-10-CM | POA: Insufficient documentation

## 2015-07-30 DIAGNOSIS — Z9049 Acquired absence of other specified parts of digestive tract: Secondary | ICD-10-CM | POA: Insufficient documentation

## 2015-07-30 LAB — COMPREHENSIVE METABOLIC PANEL
ALT: 25 U/L (ref 14–54)
AST: 24 U/L (ref 15–41)
Albumin: 4.4 g/dL (ref 3.5–5.0)
Alkaline Phosphatase: 81 U/L (ref 38–126)
Anion gap: 11 (ref 5–15)
BUN: 16 mg/dL (ref 6–20)
CO2: 22 mmol/L (ref 22–32)
Calcium: 9.8 mg/dL (ref 8.9–10.3)
Chloride: 107 mmol/L (ref 101–111)
Creatinine, Ser: 1.03 mg/dL — ABNORMAL HIGH (ref 0.44–1.00)
GFR calc Af Amer: >60 mL/min (ref >60)
GFR calc non Af Amer: 58 mL/min — ABNORMAL LOW (ref >60)
Glucose, Bld: 187 mg/dL — ABNORMAL HIGH (ref 65–99)
Potassium: 4.2 mmol/L (ref 3.5–5.1)
Sodium: 140 mmol/L (ref 135–145)
Total Bilirubin: 0.5 mg/dL (ref 0.3–1.2)
Total Protein: 7.4 g/dL (ref 6.5–8.1)

## 2015-07-30 LAB — URINALYSIS, ROUTINE W REFLEX MICROSCOPIC
Bilirubin Urine: NEGATIVE
Glucose, UA: NEGATIVE mg/dL
Ketones, ur: 15 mg/dL — AB
Leukocytes, UA: NEGATIVE
Nitrite: NEGATIVE
Protein, ur: NEGATIVE mg/dL
Specific Gravity, Urine: 1.02 (ref 1.005–1.030)
pH: 5.5 (ref 5.0–8.0)

## 2015-07-30 LAB — CBC WITH DIFFERENTIAL/PLATELET
Basophils Absolute: 0 10*3/uL (ref 0.0–0.1)
Basophils Relative: 0 %
Eosinophils Absolute: 0 10*3/uL (ref 0.0–0.7)
Eosinophils Relative: 0 %
HCT: 40.8 % (ref 36.0–46.0)
Hemoglobin: 13.7 g/dL (ref 12.0–15.0)
Lymphocytes Relative: 6 %
Lymphs Abs: 0.8 10*3/uL (ref 0.7–4.0)
MCH: 30.9 pg (ref 26.0–34.0)
MCHC: 33.6 g/dL (ref 30.0–36.0)
MCV: 91.9 fL (ref 78.0–100.0)
Monocytes Absolute: 0.3 10*3/uL (ref 0.1–1.0)
Monocytes Relative: 2 %
Neutro Abs: 12.4 10*3/uL — ABNORMAL HIGH (ref 1.7–7.7)
Neutrophils Relative %: 92 %
Platelets: 317 10*3/uL (ref 150–400)
RBC: 4.44 MIL/uL (ref 3.87–5.11)
RDW: 12.8 % (ref 11.5–15.5)
WBC: 13.5 10*3/uL — ABNORMAL HIGH (ref 4.0–10.5)

## 2015-07-30 LAB — I-STAT CG4 LACTIC ACID, ED: Lactic Acid, Venous: 1.49 mmol/L (ref 0.5–2.0)

## 2015-07-30 LAB — URINE MICROSCOPIC-ADD ON

## 2015-07-30 LAB — TROPONIN I
Troponin I: <0.03 ng/mL (ref <0.031)
Troponin I: <0.03 ng/mL (ref <0.031)

## 2015-07-30 LAB — LIPASE, BLOOD: Lipase: 40 U/L (ref 11–51)

## 2015-07-30 MED ORDER — IOHEXOL 300 MG/ML  SOLN
50.0000 mL | Freq: Once | INTRAMUSCULAR | Status: AC | PRN
  Administered 2015-07-30: 50 mL via INTRAVENOUS

## 2015-07-30 MED ORDER — SODIUM CHLORIDE 0.9 % IV BOLUS (SEPSIS)
1000.0000 mL | Freq: Once | INTRAVENOUS | Status: AC
  Administered 2015-07-30: 1000 mL via INTRAVENOUS

## 2015-07-30 MED ORDER — MORPHINE SULFATE (PF) 4 MG/ML IV SOLN
4.0000 mg | Freq: Once | INTRAVENOUS | Status: AC
  Administered 2015-07-30: 4 mg via INTRAVENOUS
  Filled 2015-07-30: qty 1

## 2015-07-30 MED ORDER — PROMETHAZINE HCL 25 MG/ML IJ SOLN
12.5000 mg | Freq: Once | INTRAMUSCULAR | Status: AC
  Administered 2015-07-30: 12.5 mg via INTRAVENOUS
  Filled 2015-07-30: qty 1

## 2015-07-30 MED ORDER — LORAZEPAM 2 MG/ML IJ SOLN
0.5000 mg | Freq: Once | INTRAMUSCULAR | Status: AC
  Administered 2015-07-30: 0.5 mg via INTRAVENOUS
  Filled 2015-07-30: qty 1

## 2015-07-30 MED ORDER — IOHEXOL 300 MG/ML  SOLN
100.0000 mL | Freq: Once | INTRAMUSCULAR | Status: AC | PRN
  Administered 2015-07-30: 50 mL via INTRAVENOUS

## 2015-07-30 MED ORDER — ONDANSETRON HCL 4 MG/2ML IJ SOLN
4.0000 mg | Freq: Once | INTRAMUSCULAR | Status: AC
  Administered 2015-07-30: 4 mg via INTRAVENOUS
  Filled 2015-07-30: qty 2

## 2015-07-30 MED ORDER — PROCHLORPERAZINE EDISYLATE 5 MG/ML IJ SOLN
10.0000 mg | Freq: Once | INTRAMUSCULAR | Status: AC
Start: 2015-07-30 — End: 2015-07-30
  Administered 2015-07-30: 10 mg via INTRAVENOUS
  Filled 2015-07-30: qty 2

## 2015-07-30 MED ORDER — ONDANSETRON HCL 4 MG PO TABS: 4.0000 mg | ORAL_TABLET | Freq: Four times a day (QID) | ORAL | Status: DC

## 2015-07-30 NOTE — ED Notes (Signed)
Pt reports abd pain with nausea x 3 days.  Reports has vomited once every morning x 3 days.  LBM was today and says was loose.

## 2015-07-30 NOTE — ED Notes (Signed)
Pt states that she feels a bit better but still feeling nauseated , Stated that she has taken a couple of sips of the ginger ale- No active vomiting since arrival in the ER

## 2015-07-30 NOTE — Discharge Instructions (Signed)
Abdominal Pain, Adult As we discussed, your liver mass is a little larger. Follow up with Dr. Gala Romney. Return to the ED if you develop new or worsening symptoms. Many things can cause abdominal pain. Usually, abdominal pain is not caused by a disease and will improve without treatment. It can often be observed and treated at home. Your health care provider will do a physical exam and possibly order blood tests and X-rays to help determine the seriousness of your pain. However, in many cases, more time must pass before a clear cause of the pain can be found. Before that point, your health care provider may not know if you need more testing or further treatment. HOME CARE INSTRUCTIONS Monitor your abdominal pain for any changes. The following actions may help to alleviate any discomfort you are experiencing:  Only take over-the-counter or prescription medicines as directed by your health care provider.  Do not take laxatives unless directed to do so by your health care provider.  Try a clear liquid diet (broth, tea, or water) as directed by your health care provider. Slowly move to a bland diet as tolerated. SEEK MEDICAL CARE IF:  You have unexplained abdominal pain.  You have abdominal pain associated with nausea or diarrhea.  You have pain when you urinate or have a bowel movement.  You experience abdominal pain that wakes you in the night.  You have abdominal pain that is worsened or improved by eating food.  You have abdominal pain that is worsened with eating fatty foods.  You have a fever. SEEK IMMEDIATE MEDICAL CARE IF:  Your pain does not go away within 2 hours.  You keep throwing up (vomiting).  Your pain is felt only in portions of the abdomen, such as the right side or the left lower portion of the abdomen.  You pass bloody or black tarry stools. MAKE SURE YOU:  Understand these instructions.  Will watch your condition.  Will get help right away if you are not doing  well or get worse.   This information is not intended to replace advice given to you by your health care provider. Make sure you discuss any questions you have with your health care provider.   Document Released: 06/01/2005 Document Revised: 05/13/2015 Document Reviewed: 05/01/2013 Elsevier Interactive Patient Education [employer].

## 2015-07-30 NOTE — ED Notes (Signed)
Discharge papers given to pt and also reviewed with daughter-- Discussed use of medication and also importance of po fluid intake- h20, gingerale, and not tea or coffee . Also discussed FU needs. Verbalized understanding and ambulated off unit with daughter

## 2015-07-30 NOTE — ED Provider Notes (Signed)
CSN:  [id number]     Arrival date & time 07/30/15  1436 History   First MD Initiated Contact with Patient 07/30/15 1455     Chief Complaint  Patient presents with  . Nausea  . Emesis     (Consider location/radiation/quality/duration/timing/severity/associated sxs/prior Treatment) HPI Comments: Reports patient reports a three-day history of nausea with epigastric pain that is constant. One episode of vomiting daily for the past 3 days. States she's had several loose bowel movements. She cannot quantify how many. Denies fever denies sick contacts denies any recent travel or antibiotic use. States she's had a poor appetite and the nausea is persistently worsening. Nothing makes the pain better or worse. She has a history of gastric ulcer status post surgery, gastric bypass, cholecystectomy, hysterectomy. States this is not feel like her ulcer. Denies any chest pain or shortness of breath. Denies any fever. Denies any pain with urination, vaginal bleeding or discharge. She's not had pain like this in the past. Nausea, vomiting, epigastric pain, history of.ro .ro   Patient is a 59 y.o. female presenting with vomiting. The history is provided by the patient.  Emesis Associated symptoms: abdominal pain and diarrhea   Associated symptoms: no arthralgias, no headaches and no myalgias     Past Medical History  Diagnosis Date  . Depression   . Chronic constipation   . Diverticulosis 01/31/2008    colonoscopy Dr Gala Romney  . Obesity   . Schizoaffective disorder     Mental Health-Dr Elias Else  . PUD (peptic ulcer disease) 1988  . Colonic inertia   . Hyperplastic colon polyp   . Diverticula of colon   . Cirrhosis ([place])     child pugh class A and MELD 5, completed hep A and B vaccine series, no HCC on 3/16 MRI   Past Surgical History  Procedure Laterality Date  . Tonsillectomy    . S/p hysterectomy      partial  . Gastric bypass      with Revision, Dr. Duffy Rhody  . Wrist surgery    . Foot  surgery      left  . Cholecystectomy  08/2010    cholelithiasis; Dr Arnoldo Morale  . Colonoscopy  01/31/2008    Dr. Gala Romney- normal rectum, L sided diverticula, long tortuous colon. hyperplasitic  polyp.  . Ercp w/ sphincterotomy and balloon dilation  08/05/2010    normal ampulla/mild erythema in the gastric remnant without ulcerations or erosions. normal retroflexed view of the cardia/anastomosis normal/distal common bile duct stones s/p extraction and shincterotomy  . Esophagogastroduodenoscopy N/A 10/24/2013    [id number] esophagus. Surgically altered stomach. Abnormal gastric mucosa  -  status post biopsy (reactive gastrophathy)   Family History  Problem Relation Age of Onset  . Colon cancer Maternal Uncle 84  . Diabetes Father   . Liver disease Neg Hx    Social History  Substance Use Topics  . Smoking status: Former Smoker -- 1.00 packs/day for 30 years    Types: Cigarettes    Quit date: 09/05/2004  . Smokeless tobacco: Former Systems developer     Comment: Quit smoking x 10 years  . Alcohol Use: No   OB History    No data available     Review of Systems  Constitutional: Positive for activity change and appetite change. Negative for fever.  HENT: Negative for congestion.   Eyes: Negative for photophobia.  Respiratory: Negative for cough, chest tightness and shortness of breath.   Cardiovascular: Negative for chest pain.  Gastrointestinal: Positive for  nausea, vomiting, abdominal pain and diarrhea.  Genitourinary: Negative for dysuria, hematuria, vaginal bleeding and vaginal discharge.  Musculoskeletal: Negative for myalgias and arthralgias.  Skin: Negative for rash.  Neurological: Negative for dizziness, weakness and headaches.   A complete 10 system review of systems was obtained and all systems are negative except as noted in the HPI and PMH.      Allergies  Ciprofloxacin  Home Medications   Prior to Admission medications   Medication Sig Start Date End Date Taking? Authorizing  Provider  buPROPion (WELLBUTRIN SR) 150 MG 12 hr tablet Take 150 mg by mouth daily. 07/20/15  Yes Historical Provider, MD  calcium-vitamin D 250-100 MG-UNIT per tablet Take 1 tablet by mouth daily.    Yes Historical Provider, MD  FLUoxetine (PROZAC) 40 MG capsule Take 40 mg by mouth daily.  06/06/12  Yes Historical Provider, MD  LINZESS 290 MCG CAPS capsule TAKE ONE CAPSULE BY MOUTH DAILY 30 MINUTES BEFORE BREAKFAST. 07/20/15  Yes Carlis Stable, NP  risperiDONE (RISPERDAL) 2 MG tablet Take 2 mg by mouth at bedtime.   Yes Historical Provider, MD  traZODone (DESYREL) 150 MG tablet Take 300 mg by mouth at bedtime. 07/20/15  Yes Historical Provider, MD  cyclobenzaprine (FLEXERIL) 5 MG tablet Take 1 tablet (5 mg total) by mouth 3 (three) times daily as needed for muscle spasms. Patient not taking: Reported on 12/12/2014 06/20/14   [facility] Bunnie Pion, NP  naproxen (NAPROSYN) 500 MG tablet Take 1 tablet (500 mg total) by mouth 2 (two) times daily. Patient not taking: Reported on 12/12/2014 06/20/14   Ashley Murrain, NP  ondansetron (ZOFRAN) 4 MG tablet Take 1 tablet (4 mg total) by mouth every 6 (six) hours. 07/30/15   Ezequiel Essex, MD  polyethylene glycol powder (GLYCOLAX/MIRALAX) powder One capful daily. Patient taking differently: Take 1 Container by mouth daily as needed for mild constipation.  05/15/14   Mahala Menghini, PA-C   BP 127/62 mmHg  Pulse 47  Temp(Src) 97.6 F (36.4 C) (Oral)  Resp 22  Ht 5\' 4"  (1.626 m)  Wt 200 lb (90.719 kg)  BMI 34.31 kg/m2  SpO2 92% Physical Exam  Constitutional: She is oriented to person, place, and time. She appears well-developed and well-nourished. No distress.  Dry mucus membranes  HENT:  Head: Normocephalic and atraumatic.  Mouth/Throat: Oropharynx is clear and moist. No oropharyngeal exudate.  Eyes: Conjunctivae and EOM are normal. Pupils are equal, round, and reactive to light.  Neck: Normal range of motion. Neck supple.  No meningismus.  Cardiovascular:  Normal rate, regular rhythm, normal heart sounds and intact distal pulses.   No murmur heard. Pulmonary/Chest: Effort normal and breath sounds normal. No respiratory distress.  Abdominal: Soft. There is tenderness. There is no rebound and no guarding.  Well healed surgical scars. TTP epigastrium. No guarding or rebound. No RUQ pain  Musculoskeletal: Normal range of motion. She exhibits no edema or tenderness.  No CVAT  Neurological: She is alert and oriented to person, place, and time. No cranial nerve deficit. She exhibits normal muscle tone. Coordination normal.  No ataxia on finger to nose bilaterally. No pronator drift. 5/5 strength throughout. CN 2-12 intact.Equal grip strength. Sensation intact.   Skin: Skin is warm.  Psychiatric: She has a normal mood and affect. Her behavior is normal.  Nursing note and vitals reviewed.   ED Course  Procedures (including critical care time) Labs Review Labs Reviewed  CBC WITH DIFFERENTIAL/PLATELET - Abnormal; Notable for the following:  WBC 13.5 (*)    Neutro Abs 12.4 (*)    All other components within normal limits  COMPREHENSIVE METABOLIC PANEL - Abnormal; Notable for the following:    Glucose, Bld 187 (*)    Creatinine, Ser 1.03 (*)    GFR calc non Af Amer 58 (*)    All other components within normal limits  URINALYSIS, ROUTINE W REFLEX MICROSCOPIC (NOT AT [facility]) - Abnormal; Notable for the following:    Hgb urine dipstick SMALL (*)    Ketones, ur 15 (*)    All other components within normal limits  URINE MICROSCOPIC-ADD ON - Abnormal; Notable for the following:    Squamous Epithelial / LPF 0-5 (*)    Bacteria, UA RARE (*)    All other components within normal limits  LIPASE, BLOOD  TROPONIN I  TROPONIN I  I-STAT CG4 LACTIC ACID, ED    Imaging Review Ct Abdomen Pelvis W Contrast  07/30/2015  CLINICAL DATA:  Epigastric pain and nausea and vomiting for 3 days. Previous gastric bypass surgery. EXAM: CT ABDOMEN AND PELVIS WITH  CONTRAST TECHNIQUE: Multidetector CT imaging of the abdomen and pelvis was performed using the standard protocol following bolus administration of intravenous contrast. CONTRAST:  75mL OMNIPAQUE IOHEXOL 300 MG/ML  SOLN COMPARISON:  08/12/2013. FINDINGS: Lower chest:  No acute findings. Hepatobiliary: No liver masses identified. Prior cholecystectomy noted. No evidence of a biliary ductal dilatation. A homogeneous low-attenuation masses seen in the porta hepatis which measures 4.9 x 5.8 cm on image 24/series 2 compared to 4.5 x 5.6 cm previously. This abuts the gastric antrum and pancreatic neck. Pancreas: No mass, inflammatory changes, or other significant abnormality. Spleen: Within normal limits in size and appearance. Adrenals/Urinary Tract: No masses identified. No evidence of hydronephrosis. Stomach/Bowel: Postop changes from gastric bypass surgery again noted. No evidence of bowel obstruction normal appendix visualized. Vascular/Lymphatic: No pathologically enlarged lymph nodes. No evidence of abdominal aortic aneurysm. Reproductive: Prior hysterectomy noted. Adnexal regions are unremarkable in appearance. Other: None. Musculoskeletal:  No suspicious bone lesions identified. IMPRESSION: No acute findings identified within the abdomen or pelvis. 5.8 cm mass in the porta hepatis abutting the gastric antrum and pancreas, which is mildly increased in size since previous studies. Differential diagnosis includes GI stromal tumor, leiomyoma, lymphoma, and atypical pancreatic neoplasm such as islet cell tumor. Electronically Signed   By: Earle Gell M.D.   On: 07/30/2015 17:12   I have personally reviewed and evaluated these images and lab results as part of my medical decision-making.   EKG Interpretation   Date/Time:  Thursday July 30 2015 14:56:42 EST Ventricular Rate:  51 PR Interval:  142 QRS Duration: 94 QT Interval:  498 QTC Calculation: 459 R Axis:   85 Text Interpretation:  Sinus arrhythmia  No significant change was found  Confirmed by Wyvonnia Dusky  MD, Thanos Cousineau [id number]) on 07/30/2015 3:19:41 PM      MDM   Final diagnoses:  Abdominal pain, unspecified abdominal location  Nausea   Epigastric pain with nausea and vomiting for the past 3 days. History of peptic ulcer disease and gastric bypass.  WBC 13, lactate normal.  CT shows mass in the porta hepatis the patient has had previously which is mildly increased in size. Recent MRI thought this to be lymph node. "No significant change in 4.1 cm short axis diameter probable porta hepatis lymph node. Given stability over time, leiomyoma, gastrointestinal stromal tumor, sarcoma or other soft tissue mass is considered significantly less likely."  Labs reviewed.  EKG unchanged, troponin negative x2.  Doubt ACS. Nausea has improved with treatment in the ED.  No vomiting.  Tolerating PO.  Followup with Dr. Gala Romney to further evaluate abdominal mass. Return precautions discussed.   Ezequiel Essex, MD 07/31/15 [phone]

## 2015-07-30 NOTE — ED Notes (Signed)
Informed by pt's daughter that pt still co feeling nauseated and unable to drink contrast - MD informed

## 2015-07-30 NOTE — ED Notes (Signed)
Dr Wyvonnia Dusky informed of repeat BP after BP cuff adjusted.

## 2015-07-30 NOTE — ED Notes (Signed)
Pt taken for ct scan

## 2015-08-01 ENCOUNTER — Inpatient Hospital Stay (HOSPITAL_COMMUNITY)
Admission: EM | Admit: 2015-08-01 | Discharge: 2015-08-04 | DRG: 392 | Disposition: A | Discharge status: Home / Self Care | Payer: Medicare Other | Attending: Pulmonary Disease | Admitting: Pulmonary Disease

## 2015-08-01 ENCOUNTER — Emergency Department (HOSPITAL_COMMUNITY): Payer: Medicare Other

## 2015-08-01 ENCOUNTER — Encounter (HOSPITAL_COMMUNITY): Payer: Self-pay | Admitting: *Deleted

## 2015-08-01 DIAGNOSIS — K7460 Unspecified cirrhosis of liver: Secondary | ICD-10-CM | POA: Diagnosis not present

## 2015-08-01 DIAGNOSIS — Z87891 Personal history of nicotine dependence: Secondary | ICD-10-CM | POA: Diagnosis not present

## 2015-08-01 DIAGNOSIS — R739 Hyperglycemia, unspecified: Secondary | ICD-10-CM | POA: Diagnosis present

## 2015-08-01 DIAGNOSIS — R110 Nausea: Secondary | ICD-10-CM | POA: Diagnosis present

## 2015-08-01 DIAGNOSIS — F259 Schizoaffective disorder, unspecified: Secondary | ICD-10-CM | POA: Diagnosis not present

## 2015-08-01 DIAGNOSIS — Z9884 Bariatric surgery status: Secondary | ICD-10-CM | POA: Diagnosis not present

## 2015-08-01 DIAGNOSIS — F250 Schizoaffective disorder, bipolar type: Secondary | ICD-10-CM | POA: Diagnosis not present

## 2015-08-01 DIAGNOSIS — Z800 Family history of malignant neoplasm of digestive organs: Secondary | ICD-10-CM | POA: Diagnosis not present

## 2015-08-01 DIAGNOSIS — R109 Unspecified abdominal pain: Secondary | ICD-10-CM | POA: Diagnosis not present

## 2015-08-01 DIAGNOSIS — R1900 Intra-abdominal and pelvic swelling, mass and lump, unspecified site: Secondary | ICD-10-CM | POA: Diagnosis present

## 2015-08-01 DIAGNOSIS — Z833 Family history of diabetes mellitus: Secondary | ICD-10-CM | POA: Diagnosis not present

## 2015-08-01 DIAGNOSIS — K659 Peritonitis, unspecified: Secondary | ICD-10-CM | POA: Diagnosis not present

## 2015-08-01 DIAGNOSIS — J441 Chronic obstructive pulmonary disease with (acute) exacerbation: Secondary | ICD-10-CM | POA: Diagnosis not present

## 2015-08-01 DIAGNOSIS — R1907 Generalized intra-abdominal and pelvic swelling, mass and lump: Secondary | ICD-10-CM | POA: Diagnosis not present

## 2015-08-01 DIAGNOSIS — R112 Nausea with vomiting, unspecified: Principal | ICD-10-CM | POA: Diagnosis present

## 2015-08-01 DIAGNOSIS — R197 Diarrhea, unspecified: Secondary | ICD-10-CM | POA: Diagnosis not present

## 2015-08-01 DIAGNOSIS — G8918 Other acute postprocedural pain: Secondary | ICD-10-CM | POA: Diagnosis not present

## 2015-08-01 DIAGNOSIS — E860 Dehydration: Secondary | ICD-10-CM | POA: Diagnosis present

## 2015-08-01 DIAGNOSIS — R1013 Epigastric pain: Secondary | ICD-10-CM | POA: Diagnosis not present

## 2015-08-01 LAB — COMPREHENSIVE METABOLIC PANEL
ALT: 25 U/L (ref 14–54)
AST: 28 U/L (ref 15–41)
Albumin: 4.3 g/dL (ref 3.5–5.0)
Alkaline Phosphatase: 81 U/L (ref 38–126)
Anion gap: 11 (ref 5–15)
BUN: 19 mg/dL (ref 6–20)
CO2: 20 mmol/L — ABNORMAL LOW (ref 22–32)
Calcium: 9.6 mg/dL (ref 8.9–10.3)
Chloride: 109 mmol/L (ref 101–111)
Creatinine, Ser: 1.14 mg/dL — ABNORMAL HIGH (ref 0.44–1.00)
GFR calc Af Amer: 60 mL/min — ABNORMAL LOW (ref >60)
GFR calc non Af Amer: 52 mL/min — ABNORMAL LOW (ref >60)
Glucose, Bld: 157 mg/dL — ABNORMAL HIGH (ref 65–99)
Potassium: 3.5 mmol/L (ref 3.5–5.1)
Sodium: 140 mmol/L (ref 135–145)
Total Bilirubin: 1.2 mg/dL (ref 0.3–1.2)
Total Protein: 7.7 g/dL (ref 6.5–8.1)

## 2015-08-01 LAB — CBC WITH DIFFERENTIAL/PLATELET
Basophils Absolute: 0 10*3/uL (ref 0.0–0.1)
Basophils Relative: 0 %
Eosinophils Absolute: 0.1 10*3/uL (ref 0.0–0.7)
Eosinophils Relative: 1 %
HCT: 43.2 % (ref 36.0–46.0)
Hemoglobin: 14.5 g/dL (ref 12.0–15.0)
Lymphocytes Relative: 12 %
Lymphs Abs: 1.6 10*3/uL (ref 0.7–4.0)
MCH: 30.9 pg (ref 26.0–34.0)
MCHC: 33.6 g/dL (ref 30.0–36.0)
MCV: 91.9 fL (ref 78.0–100.0)
Monocytes Absolute: 1.1 10*3/uL — ABNORMAL HIGH (ref 0.1–1.0)
Monocytes Relative: 8 %
Neutro Abs: 11.3 10*3/uL — ABNORMAL HIGH (ref 1.7–7.7)
Neutrophils Relative %: 79 %
Platelets: 365 10*3/uL (ref 150–400)
RBC: 4.7 MIL/uL (ref 3.87–5.11)
RDW: 12.6 % (ref 11.5–15.5)
WBC: 14.1 10*3/uL — ABNORMAL HIGH (ref 4.0–10.5)

## 2015-08-01 LAB — TROPONIN I: Troponin I: <0.03 ng/mL (ref <0.031)

## 2015-08-01 MED ORDER — ONDANSETRON HCL 4 MG PO TABS
4.0000 mg | ORAL_TABLET | Freq: Four times a day (QID) | ORAL | Status: DC | PRN
  Administered 2015-08-02: 4 mg via ORAL
  Filled 2015-08-01: qty 1

## 2015-08-01 MED ORDER — MORPHINE SULFATE (PF) 4 MG/ML IV SOLN: 4.0000 mg | Freq: Once | INTRAVENOUS | Status: DC

## 2015-08-01 MED ORDER — ONDANSETRON HCL 4 MG/2ML IJ SOLN
4.0000 mg | Freq: Four times a day (QID) | INTRAMUSCULAR | Status: DC | PRN
  Administered 2015-08-01 – 2015-08-02 (×4): 4 mg via INTRAVENOUS
  Filled 2015-08-01 (×5): qty 2

## 2015-08-01 MED ORDER — PROCHLORPERAZINE EDISYLATE 5 MG/ML IJ SOLN
5.0000 mg | Freq: Once | INTRAMUSCULAR | Status: AC
  Administered 2015-08-01: 5 mg via INTRAVENOUS
  Filled 2015-08-01: qty 2

## 2015-08-01 MED ORDER — ONDANSETRON HCL 4 MG/2ML IJ SOLN
4.0000 mg | Freq: Once | INTRAMUSCULAR | Status: AC
  Administered 2015-08-01: 4 mg via INTRAVENOUS
  Filled 2015-08-01: qty 2

## 2015-08-01 MED ORDER — HYDROMORPHONE HCL 1 MG/ML IJ SOLN
1.0000 mg | INTRAMUSCULAR | Status: DC | PRN
  Administered 2015-08-01 (×2): 1 mg via INTRAVENOUS
  Filled 2015-08-01 (×2): qty 1

## 2015-08-01 MED ORDER — PROMETHAZINE HCL 25 MG/ML IJ SOLN
12.5000 mg | Freq: Once | INTRAMUSCULAR | Status: AC
  Administered 2015-08-01: 12.5 mg via INTRAVENOUS

## 2015-08-01 MED ORDER — FLUOXETINE HCL 20 MG PO CAPS
40.0000 mg | ORAL_CAPSULE | Freq: Every day | ORAL | Status: DC
  Administered 2015-08-01 – 2015-08-04 (×4): 40 mg via ORAL
  Filled 2015-08-01 (×5): qty 2

## 2015-08-01 MED ORDER — KCL IN DEXTROSE-NACL 20-5-0.9 MEQ/L-%-% IV SOLN
INTRAVENOUS | Status: DC
  Administered 2015-08-01: 1000 mL via INTRAVENOUS
  Administered 2015-08-02 – 2015-08-03 (×3): via INTRAVENOUS

## 2015-08-01 MED ORDER — HEPARIN SODIUM (PORCINE) 5000 UNIT/ML IJ SOLN
5000.0000 [IU] | Freq: Three times a day (TID) | INTRAMUSCULAR | Status: DC
  Administered 2015-08-01 – 2015-08-04 (×9): 5000 [IU] via SUBCUTANEOUS
  Filled 2015-08-01 (×9): qty 1

## 2015-08-01 MED ORDER — PROMETHAZINE HCL 25 MG/ML IJ SOLN
INTRAMUSCULAR | Status: AC
  Filled 2015-08-01: qty 1

## 2015-08-01 MED ORDER — MORPHINE SULFATE (PF) 2 MG/ML IV SOLN
INTRAVENOUS | Status: AC
  Administered 2015-08-01: 4 mg via INTRAVENOUS
  Filled 2015-08-01: qty 2

## 2015-08-01 MED ORDER — LORAZEPAM 2 MG/ML IJ SOLN
1.0000 mg | INTRAMUSCULAR | Status: DC | PRN
  Administered 2015-08-01 (×2): 1 mg via INTRAVENOUS
  Filled 2015-08-01 (×2): qty 1

## 2015-08-01 MED ORDER — RISPERIDONE 1 MG PO TABS
2.0000 mg | ORAL_TABLET | Freq: Every day | ORAL | Status: DC
  Administered 2015-08-01 – 2015-08-03 (×3): 2 mg via ORAL
  Filled 2015-08-01 (×3): qty 2

## 2015-08-01 MED ORDER — METOCLOPRAMIDE HCL 5 MG/ML IJ SOLN
10.0000 mg | Freq: Once | INTRAMUSCULAR | Status: AC
  Administered 2015-08-01: 10 mg via INTRAVENOUS
  Filled 2015-08-01: qty 2

## 2015-08-01 MED ORDER — TRAZODONE HCL 50 MG PO TABS
300.0000 mg | ORAL_TABLET | Freq: Every day | ORAL | Status: DC
  Administered 2015-08-01 – 2015-08-03 (×3): 300 mg via ORAL
  Filled 2015-08-01 (×4): qty 6

## 2015-08-01 MED ORDER — MORPHINE SULFATE (PF) 4 MG/ML IV SOLN
4.0000 mg | Freq: Once | INTRAVENOUS | Status: AC
  Administered 2015-08-01: 4 mg via INTRAVENOUS
  Filled 2015-08-01: qty 1

## 2015-08-01 NOTE — ED Provider Notes (Signed)
CSN: [id number]     Arrival date & time 08/01/15  [id number] History   First MD Initiated Contact with Patient 08/01/15 0740     Chief Complaint  Patient presents with  . Abdominal Pain     (Consider location/radiation/quality/duration/timing/severity/associated sxs/prior Treatment) HPI Complains of epigastric abdominal pain nonradiating onset last night with multiple episodes of vomiting. Feels nauseated present. Pain is constant, similar to pain for which she was evaluated here 2 days ago. Had CT scan showing 5.8. centimeter mass in porta hepatis abutting gastric antrum. Last bowel movement this morning, runny "no fever. Nothing makes pain better or worse. No other associated symptoms. No fever. She reports she felt transiently better yesterday however began to feel ill again last night. No treatment prior to coming here. Past Medical History  Diagnosis Date  . Depression   . Chronic constipation   . Diverticulosis 01/31/2008    colonoscopy Dr Gala Romney  . Obesity   . Schizoaffective disorder     Mental Health-Dr Elias Else  . PUD (peptic ulcer disease) 1988  . Colonic inertia   . Hyperplastic colon polyp   . Diverticula of colon   . Cirrhosis ([place])     child pugh class A and MELD 5, completed hep A and B vaccine series, no HCC on 3/16 MRI   Past Surgical History  Procedure Laterality Date  . Tonsillectomy    . S/p hysterectomy      partial  . Gastric bypass      with Revision, Dr. Duffy Rhody  . Wrist surgery    . Foot surgery      left  . Cholecystectomy  08/2010    cholelithiasis; Dr Arnoldo Morale  . Colonoscopy  01/31/2008    Dr. Gala Romney- normal rectum, L sided diverticula, long tortuous colon. hyperplasitic  polyp.  . Ercp w/ sphincterotomy and balloon dilation  08/05/2010    normal ampulla/mild erythema in the gastric remnant without ulcerations or erosions. normal retroflexed view of the cardia/anastomosis normal/distal common bile duct stones s/p extraction and shincterotomy  .  Esophagogastroduodenoscopy N/A 10/24/2013    [id number] esophagus. Surgically altered stomach. Abnormal gastric mucosa  -  status post biopsy (reactive gastrophathy)   Family History  Problem Relation Age of Onset  . Colon cancer Maternal Uncle 98  . Diabetes Father   . Liver disease Neg Hx    Social History  Substance Use Topics  . Smoking status: Former Smoker -- 1.00 packs/day for 30 years    Types: Cigarettes    Quit date: 07/06/2015  . Smokeless tobacco: Former Systems developer     Comment: Quit smoking x 10 years  . Alcohol Use: No   OB History    No data available     Review of Systems  Constitutional: Negative.   HENT: Negative.   Respiratory: Negative.   Cardiovascular: Negative.   Gastrointestinal: Positive for nausea, vomiting and abdominal pain.  Musculoskeletal: Negative.   Skin: Negative.   Neurological: Negative.   Psychiatric/Behavioral: Negative.   All other systems reviewed and are negative.     Allergies  Ciprofloxacin  Home Medications   Prior to Admission medications   Medication Sig Start Date End Date Taking? Authorizing Provider  buPROPion (WELLBUTRIN SR) 150 MG 12 hr tablet Take 150 mg by mouth daily. 07/20/15   Historical Provider, MD  calcium-vitamin D 250-100 MG-UNIT per tablet Take 1 tablet by mouth daily.     Historical Provider, MD  cyclobenzaprine (FLEXERIL) 5 MG tablet Take 1 tablet (5 mg total)  by mouth 3 (three) times daily as needed for muscle spasms. Patient not taking: Reported on 12/12/2014 06/20/14   Ashley Murrain, NP  FLUoxetine (PROZAC) 40 MG capsule Take 40 mg by mouth daily.  06/06/12   Historical Provider, MD  LINZESS 290 MCG CAPS capsule TAKE ONE CAPSULE BY MOUTH DAILY 30 MINUTES BEFORE BREAKFAST. 07/20/15   Carlis Stable, NP  naproxen (NAPROSYN) 500 MG tablet Take 1 tablet (500 mg total) by mouth 2 (two) times daily. Patient not taking: Reported on 12/12/2014 06/20/14   Ashley Murrain, NP  ondansetron (ZOFRAN) 4 MG tablet Take 1 tablet (4 mg  total) by mouth every 6 (six) hours. 07/30/15   Ezequiel Essex, MD  polyethylene glycol powder (GLYCOLAX/MIRALAX) powder One capful daily. Patient taking differently: Take 1 Container by mouth daily as needed for mild constipation.  05/15/14   Mahala Menghini, PA-C  risperiDONE (RISPERDAL) 2 MG tablet Take 2 mg by mouth at bedtime.    Historical Provider, MD  traZODone (DESYREL) 150 MG tablet Take 300 mg by mouth at bedtime. 07/20/15   Historical Provider, MD   BP 174/78 mmHg  Pulse 41  Temp(Src) 97.8 F (36.6 C) (Oral)  Resp 22  Ht 5\' 4"  (1.626 m)  Wt 194 lb (87.998 kg)  BMI 33.28 kg/m2  SpO2 100% Physical Exam  Constitutional: She appears well-developed and well-nourished.  Mildly ill-appearing  HENT:  Head: Normocephalic and atraumatic.  Eyes: Conjunctivae are normal. Pupils are equal, round, and reactive to light.  Neck: Neck supple. No tracheal deviation present. No thyromegaly present.  Cardiovascular: Regular rhythm.   No murmur heard. Bradycardic  Pulmonary/Chest: Effort normal and breath sounds normal.  Abdominal: Soft. Bowel sounds are normal. She exhibits no distension. There is tenderness.  Epigastric tenderness  Musculoskeletal: Normal range of motion. She exhibits no edema or tenderness.  Neurological: She is alert. Coordination normal.  Skin: Skin is warm and dry. No rash noted.  Psychiatric: She has a normal mood and affect.  Nursing note and vitals reviewed.   ED Course  Procedures (including critical care time) Labs Review Labs Reviewed - No data to display  Imaging Review Ct Abdomen Pelvis W Contrast  07/30/2015  CLINICAL DATA:  Epigastric pain and nausea and vomiting for 3 days. Previous gastric bypass surgery. EXAM: CT ABDOMEN AND PELVIS WITH CONTRAST TECHNIQUE: Multidetector CT imaging of the abdomen and pelvis was performed using the standard protocol following bolus administration of intravenous contrast. CONTRAST:  26mL OMNIPAQUE IOHEXOL 300 MG/ML   SOLN COMPARISON:  08/12/2013. FINDINGS: Lower chest:  No acute findings. Hepatobiliary: No liver masses identified. Prior cholecystectomy noted. No evidence of a biliary ductal dilatation. A homogeneous low-attenuation masses seen in the porta hepatis which measures 4.9 x 5.8 cm on image 24/series 2 compared to 4.5 x 5.6 cm previously. This abuts the gastric antrum and pancreatic neck. Pancreas: No mass, inflammatory changes, or other significant abnormality. Spleen: Within normal limits in size and appearance. Adrenals/Urinary Tract: No masses identified. No evidence of hydronephrosis. Stomach/Bowel: Postop changes from gastric bypass surgery again noted. No evidence of bowel obstruction normal appendix visualized. Vascular/Lymphatic: No pathologically enlarged lymph nodes. No evidence of abdominal aortic aneurysm. Reproductive: Prior hysterectomy noted. Adnexal regions are unremarkable in appearance. Other: None. Musculoskeletal:  No suspicious bone lesions identified. IMPRESSION: No acute findings identified within the abdomen or pelvis. 5.8 cm mass in the porta hepatis abutting the gastric antrum and pancreas, which is mildly increased in size since previous studies. Differential  diagnosis includes GI stromal tumor, leiomyoma, lymphoma, and atypical pancreatic neoplasm such as islet cell tumor. Electronically Signed   By: Earle Gell M.D.   On: 07/30/2015 17:12   I have personally reviewed and evaluated these images and lab results as part of my medical  decision-making.   EKG Interpretation   Date/Time:  Saturday August 01 2015 07:41:53 EST Ventricular Rate:  44 PR Interval:  132 QRS Duration: 94 QT Interval:  516 QTC Calculation: 441 R Axis:   86 Text Interpretation:  Sinus bradycardia No significant change since last  tracing Confirmed by Winfred Leeds  MD, Desarai Barrack [phone]) on 08/01/2015 8:07:23 AM     X-rays viewed by me  11:25 PM patient continues to feel badly with nausea, dry heaves after  multiple doses of intravenous antiemetics and intravenous analgesics Results for orders placed or performed during the hospital encounter of 08/01/15  Comprehensive metabolic panel  Result Value Ref Range   Sodium 140 135 - 145 mmol/L   Potassium 3.5 3.5 - 5.1 mmol/L   Chloride 109 101 - 111 mmol/L   CO2 20 (L) 22 - 32 mmol/L   Glucose, Bld 157 (H) 65 - 99 mg/dL   BUN 19 6 - 20 mg/dL   Creatinine, Ser 1.14 (H) 0.44 - 1.00 mg/dL   Calcium 9.6 8.9 - 10.3 mg/dL   Total Protein 7.7 6.5 - 8.1 g/dL   Albumin 4.3 3.5 - 5.0 g/dL   AST 28 15 - 41 U/L   ALT 25 14 - 54 U/L   Alkaline Phosphatase 81 38 - 126 U/L   Total Bilirubin 1.2 0.3 - 1.2 mg/dL   GFR calc non Af Amer 52 (L) >60 mL/min   GFR calc Af Amer 60 (L) >60 mL/min   Anion gap 11 5 - 15  CBC with Differential/Platelet  Result Value Ref Range   WBC 14.1 (H) 4.0 - 10.5 K/uL   RBC 4.70 3.87 - 5.11 MIL/uL   Hemoglobin 14.5 12.0 - 15.0 g/dL   HCT 43.2 36.0 - 46.0 %   MCV 91.9 78.0 - 100.0 fL   MCH 30.9 26.0 - 34.0 pg   MCHC 33.6 30.0 - 36.0 g/dL   RDW 12.6 11.5 - 15.5 %   Platelets 365 150 - 400 K/uL   Neutrophils Relative % 79 %   Neutro Abs 11.3 (H) 1.7 - 7.7 K/uL   Lymphocytes Relative 12 %   Lymphs Abs 1.6 0.7 - 4.0 K/uL   Monocytes Relative 8 %   Monocytes Absolute 1.1 (H) 0.1 - 1.0 K/uL   Eosinophils Relative 1 %   Eosinophils Absolute 0.1 0.0 - 0.7 K/uL   Basophils Relative 0 %   Basophils Absolute 0.0 0.0 - 0.1 K/uL  Troponin I  Result Value Ref Range   Troponin I <0.03 <0.031 ng/mL   Ct Abdomen Pelvis W Contrast  07/30/2015  CLINICAL DATA:  Epigastric pain and nausea and vomiting for 3 days. Previous gastric bypass surgery. EXAM: CT ABDOMEN AND PELVIS WITH CONTRAST TECHNIQUE: Multidetector CT imaging of the abdomen and pelvis was performed using the standard protocol following bolus administration of intravenous contrast. CONTRAST:  25mL OMNIPAQUE IOHEXOL 300 MG/ML  SOLN COMPARISON:  08/12/2013. FINDINGS: Lower  chest:  No acute findings. Hepatobiliary: No liver masses identified. Prior cholecystectomy noted. No evidence of a biliary ductal dilatation. A homogeneous low-attenuation masses seen in the porta hepatis which measures 4.9 x 5.8 cm on image 24/series 2 compared to 4.5 x 5.6 cm previously.  This abuts the gastric antrum and pancreatic neck. Pancreas: No mass, inflammatory changes, or other significant abnormality. Spleen: Within normal limits in size and appearance. Adrenals/Urinary Tract: No masses identified. No evidence of hydronephrosis. Stomach/Bowel: Postop changes from gastric bypass surgery again noted. No evidence of bowel obstruction normal appendix visualized. Vascular/Lymphatic: No pathologically enlarged lymph nodes. No evidence of abdominal aortic aneurysm. Reproductive: Prior hysterectomy noted. Adnexal regions are unremarkable in appearance. Other: None. Musculoskeletal:  No suspicious bone lesions identified. IMPRESSION: No acute findings identified within the abdomen or pelvis. 5.8 cm mass in the porta hepatis abutting the gastric antrum and pancreas, which is mildly increased in size since previous studies. Differential diagnosis includes GI stromal tumor, leiomyoma, lymphoma, and atypical pancreatic neoplasm such as islet cell tumor. Electronically Signed   By: Earle Gell M.D.   On: 07/30/2015 17:12   Dg Abd Acute W/chest  08/01/2015  CLINICAL DATA:  Nausea, vomiting and diarrhea since 07/28/2015. Initial encounter. EXAM: DG ABDOMEN ACUTE W/ 1V CHEST COMPARISON:  CT abdomen and pelvis 07/30/2015. FINDINGS: Single view of the chest demonstrates clear lungs and normal heart size. No pneumothorax or pleural effusion. Aortic atherosclerosis is noted. Two views of the abdomen show no free intraperitoneal air. Multiple surgical clips are seen in the left upper quadrant of the abdomen and there are sutures and clips in the right upper quadrant. The bowel gas pattern is nonobstructive. There is mild  convex left lumbar scoliosis. IMPRESSION: No acute finding chest or abdomen. Electronically Signed   By: Inge Rise M.D.   On: 08/01/2015 09:05    MDM  I spoke with radiologist reviewed CT scan from 2 days ago. Suggest that tumor in porta hepatis is large, maybe invading celiac plexus causing her symptoms and she had no evidence of obstruction 2 days ago Final diagnoses:  None   I spoke with Dr.Le who will make arrangements for inpatient stay Dx #1 intractable vomiting #2 epigastric pain #3 hyperglycemia     Orlie Dakin, MD 08/01/15 1128

## 2015-08-01 NOTE — H&P (Signed)
[facility] History and Physical  [name] [id number] DOB: 1956-01-03    PCP:   Alonza Bogus, MD   Chief Complaint: abdominal pain and nausea.   HPI: [name] is an 59 y.o. female with hx of depression, PUD, schizoaffective disorder, s/p gastric bypass surgery,  liver cirrhosis, and an enlarged porta hepatis lesion, thought to be a non malignant lymphnode, followed by Dr Sydell Axon, returned to the ER with increased abdominal pain, nausea, but no vomiting.   Two days ago, she was seen in the ER, and an abdominal pelvic CT/Pelvic showed the porta hepatis mass has enlarged to 5.8cm at this time, queried a GIST, leiomyoma, lymphoma, or pancreatic neoplasm.  She was given antiemetics, but her nausea wasn't abated, so hospitalist was asked to admit her for further symptomatic treatment.   Rewiew of Systems:  Constitutional: Negative for malaise, fever and chills. No significant weight loss or weight gain Eyes: Negative for eye pain, redness and discharge, diplopia, visual changes, or flashes of light. ENMT: Negative for ear pain, hoarseness, nasal congestion, sinus pressure and sore throat. No headaches; tinnitus, drooling, or problem swallowing. Cardiovascular: Negative for chest pain, palpitations, diaphoresis, dyspnea and peripheral edema. ; No orthopnea, PND Respiratory: Negative for cough, hemoptysis, wheezing and stridor. No pleuritic chestpain. Gastrointestinal: Negative for  vomiting, diarrhea, constipation, melena, blood in stool, hematemesis, jaundice and rectal bleeding.    Genitourinary: Negative for frequency, dysuria, incontinence,flank pain and hematuria; Musculoskeletal: Negative for back pain and neck pain. Negative for swelling and trauma.;  Skin: . Negative for pruritus, rash, abrasions, bruising and skin lesion.; ulcerations Neuro: Negative for headache, lightheadedness and neck stiffness. Negative for weakness, altered level of consciousness , altered  mental status, extremity weakness, burning feet, involuntary movement, seizure and syncope.  Psych: negative for anxiety, depression, insomnia, tearfulness, panic attacks, hallucinations, paranoia, suicidal or homicidal ideation    Past Medical History  Diagnosis Date  . Depression   . Chronic constipation   . Diverticulosis 01/31/2008    colonoscopy Dr Gala Romney  . Obesity   . Schizoaffective disorder     Mental Health-Dr Elias Else  . PUD (peptic ulcer disease) 1988  . Colonic inertia   . Hyperplastic colon polyp   . Diverticula of colon   . Cirrhosis ([place])     child pugh class A and MELD 5, completed hep A and B vaccine series, no HCC on 3/16 MRI    Past Surgical History  Procedure Laterality Date  . Tonsillectomy    . S/p hysterectomy      partial  . Gastric bypass      with Revision, Dr. Duffy Rhody  . Wrist surgery    . Foot surgery      left  . Cholecystectomy  08/2010    cholelithiasis; Dr Arnoldo Morale  . Colonoscopy  01/31/2008    Dr. Gala Romney- normal rectum, L sided diverticula, long tortuous colon. hyperplasitic  polyp.  . Ercp w/ sphincterotomy and balloon dilation  08/05/2010    normal ampulla/mild erythema in the gastric remnant without ulcerations or erosions. normal retroflexed view of the cardia/anastomosis normal/distal common bile duct stones s/p extraction and shincterotomy  . Esophagogastroduodenoscopy N/A 10/24/2013    [id number] esophagus. Surgically altered stomach. Abnormal gastric mucosa  -  status post biopsy (reactive gastrophathy)    Medications:  HOME MEDS: Prior to Admission medications   Medication Sig Start Date End Date Taking? Authorizing Provider  buPROPion (WELLBUTRIN SR) 150 MG 12 hr tablet Take 150 mg by mouth daily.  07/20/15  Yes Historical Provider, MD  calcium-vitamin D 250-100 MG-UNIT per tablet Take 1 tablet by mouth daily.    Yes Historical Provider, MD  FLUoxetine (PROZAC) 40 MG capsule Take 40 mg by mouth daily.  06/06/12  Yes Historical  Provider, MD  LINZESS 290 MCG CAPS capsule TAKE ONE CAPSULE BY MOUTH DAILY 30 MINUTES BEFORE BREAKFAST. 07/20/15  Yes Carlis Stable, NP  ondansetron (ZOFRAN) 4 MG tablet Take 1 tablet (4 mg total) by mouth every 6 (six) hours. 07/30/15  Yes Ezequiel Essex, MD  polyethylene glycol powder (GLYCOLAX/MIRALAX) powder One capful daily. Patient taking differently: Take 1 Container by mouth daily as needed for mild constipation.  05/15/14  Yes Mahala Menghini, PA-C  risperiDONE (RISPERDAL) 2 MG tablet Take 2 mg by mouth at bedtime.   Yes Historical Provider, MD  traZODone (DESYREL) 150 MG tablet Take 300 mg by mouth at bedtime. 07/20/15  Yes Historical Provider, MD  cyclobenzaprine (FLEXERIL) 5 MG tablet Take 1 tablet (5 mg total) by mouth 3 (three) times daily as needed for muscle spasms. Patient not taking: Reported on 12/12/2014 06/20/14   [facility] Bunnie Pion, NP  naproxen (NAPROSYN) 500 MG tablet Take 1 tablet (500 mg total) by mouth 2 (two) times daily. Patient not taking: Reported on 12/12/2014 06/20/14   Ashley Murrain, NP     Allergies:  Allergies  Allergen Reactions  . Ciprofloxacin Rash    Social History:   reports that she quit smoking about 3 weeks ago. Her smoking use included Cigarettes. She has a 30 pack-year smoking history. She has quit using smokeless tobacco. She reports that she does not drink alcohol or use illicit drugs.  Family History: Family History  Problem Relation Age of Onset  . Colon cancer Maternal Uncle 39  . Diabetes Father   . Liver disease Neg Hx      Physical Exam: Filed Vitals:   08/01/15 1000 08/01/15 1030 08/01/15 1100 08/01/15 1133  BP: 125/95 148/75 158/71 139/62  Pulse: 46 48 56 51  Temp:      TempSrc:      Resp: 13 19 16 16   Height:      Weight:      SpO2: 99% 99% 99% 94%   Blood pressure 139/62, pulse 51, temperature 97.8 F (36.6 C), temperature source Oral, resp. rate 16, height 5\' 4"  (1.626 m), weight 87.998 kg (194 lb), SpO2 94 %.  GEN:  Pleasant   patient lying in the stretcher in no acute distress; cooperative with exam. PSYCH:  alert and oriented x4; does not appear anxious or depressed; affect is appropriate. HEENT: Mucous membranes pink and anicteric; PERRLA; EOM intact; no cervical lymphadenopathy nor thyromegaly or carotid bruit; no JVD; There were no stridor. Neck is very supple. Breasts:: Not examined CHEST WALL: No tenderness CHEST: Normal respiration, clear to auscultation bilaterally.  HEART: Regular rate and rhythm.  There are no murmur, rub, or gallops.   BACK: No kyphosis or scoliosis; no CVA tenderness ABDOMEN: soft and non-tender; no masses, no organomegaly, normal abdominal bowel sounds; no pannus; no intertriginous candida. There is no rebound and no distention. Rectal Exam: Not done EXTREMITIES: No bone or joint deformity; age-appropriate arthropathy of the hands and knees; no edema; no ulcerations.  There is no calf tenderness. Genitalia: not examined PULSES: 2+ and symmetric SKIN: Normal hydration no rash or ulceration CNS: Cranial nerves 2-12 grossly intact no focal lateralizing neurologic deficit.  Speech is fluent; uvula elevated with phonation, facial symmetry  and tongue midline. DTR are normal bilaterally, cerebella exam is intact, barbinski is negative and strengths are equaled bilaterally.  No sensory loss.   Labs on Admission:  Basic Metabolic Panel:  Recent Labs Lab 07/30/15 1506 08/01/15 0805  NA 140 140  K 4.2 3.5  CL 107 109  CO2 22 20*  GLUCOSE 187* 157*  BUN 16 19  CREATININE 1.03* 1.14*  CALCIUM 9.8 9.6   Liver Function Tests:  Recent Labs Lab 07/30/15 1506 08/01/15 0805  AST 24 28  ALT 25 25  ALKPHOS 81 81  BILITOT 0.5 1.2  PROT 7.4 7.7  ALBUMIN 4.4 4.3    Recent Labs Lab 07/30/15 1506  LIPASE 40   CBC:  Recent Labs Lab 07/30/15 1506 08/01/15 0805  WBC 13.5* 14.1*  NEUTROABS 12.4* 11.3*  HGB 13.7 14.5  HCT 40.8 43.2  MCV 91.9 91.9  PLT 317 365   Cardiac  Enzymes:  Recent Labs Lab 07/30/15 1506 07/30/15 2039 08/01/15 0805  TROPONINI <0.03 <0.03 <0.03    Radiological Exams on Admission: Ct Abdomen Pelvis W Contrast  07/30/2015  CLINICAL DATA:  Epigastric pain and nausea and vomiting for 3 days. Previous gastric bypass surgery. EXAM: CT ABDOMEN AND PELVIS WITH CONTRAST TECHNIQUE: Multidetector CT imaging of the abdomen and pelvis was performed using the standard protocol following bolus administration of intravenous contrast. CONTRAST:  74mL OMNIPAQUE IOHEXOL 300 MG/ML  SOLN COMPARISON:  08/12/2013. FINDINGS: Lower chest:  No acute findings. Hepatobiliary: No liver masses identified. Prior cholecystectomy noted. No evidence of a biliary ductal dilatation. A homogeneous low-attenuation masses seen in the porta hepatis which measures 4.9 x 5.8 cm on image 24/series 2 compared to 4.5 x 5.6 cm previously. This abuts the gastric antrum and pancreatic neck. Pancreas: No mass, inflammatory changes, or other significant abnormality. Spleen: Within normal limits in size and appearance. Adrenals/Urinary Tract: No masses identified. No evidence of hydronephrosis. Stomach/Bowel: Postop changes from gastric bypass surgery again noted. No evidence of bowel obstruction normal appendix visualized. Vascular/Lymphatic: No pathologically enlarged lymph nodes. No evidence of abdominal aortic aneurysm. Reproductive: Prior hysterectomy noted. Adnexal regions are unremarkable in appearance. Other: None. Musculoskeletal:  No suspicious bone lesions identified. IMPRESSION: No acute findings identified within the abdomen or pelvis. 5.8 cm mass in the porta hepatis abutting the gastric antrum and pancreas, which is mildly increased in size since previous studies. Differential diagnosis includes GI stromal tumor, leiomyoma, lymphoma, and atypical pancreatic neoplasm such as islet cell tumor. Electronically Signed   By: Earle Gell M.D.   On: 07/30/2015 17:12   Dg Abd Acute  W/chest  08/01/2015  CLINICAL DATA:  Nausea, vomiting and diarrhea since 07/28/2015. Initial encounter. EXAM: DG ABDOMEN ACUTE W/ 1V CHEST COMPARISON:  CT abdomen and pelvis 07/30/2015. FINDINGS: Single view of the chest demonstrates clear lungs and normal heart size. No pneumothorax or pleural effusion. Aortic atherosclerosis is noted. Two views of the abdomen show no free intraperitoneal air. Multiple surgical clips are seen in the left upper quadrant of the abdomen and there are sutures and clips in the right upper quadrant. The bowel gas pattern is nonobstructive. There is mild convex left lumbar scoliosis. IMPRESSION: No acute finding chest or abdomen. Electronically Signed   By: Inge Rise M.D.   On: 08/01/2015 09:05   Assessment/Plan Present on Admission:  . Abdominal pain . Nausea . Schizoaffective disorder ([place]) . Abdominal mass  PLAN:  Will admit her to Dr Luan Pulling service for symptomatic Tx using IV Zofran and  IV ativan to augment antiemetic effects of benzo.  Consider GI consult inpatient vs follow up outpatient as the mass has enlarged at this time.  She is otherwise stable, full code, and will continue her home meds.   Thank you and Good Day.   Other plans as per orders.  Code Status: FULL Haskel Khan, MD. [facility] Pager [phone] 7pm to 7am.  08/01/2015, 12:00 PM

## 2015-08-01 NOTE — ED Notes (Signed)
Pt requesting more pain medication and nausea medication.   Pt has had no episodes of emesis since being in ED. Pt given fluids and have tolerated them well.

## 2015-08-01 NOTE — ED Notes (Signed)
MD at bedside. 

## 2015-08-01 NOTE — ED Notes (Addendum)
Pt states upper abdominal pain and nausea today. States she began getting sick on Tuesday and has been see her previously this week for same. States she last vomited 2 days ago. States severe nausea today. Was feeling better yesterday and got worse again last night. Pt did not get nausea rx filled because she was feeling better.

## 2015-08-01 NOTE — ED Notes (Addendum)
Pt states she is still having nausea, MD made aware.   Pt has no episodes of emesis.

## 2015-08-01 NOTE — ED Notes (Signed)
Pt given fluids.  

## 2015-08-02 DIAGNOSIS — R1900 Intra-abdominal and pelvic swelling, mass and lump, unspecified site: Secondary | ICD-10-CM | POA: Diagnosis not present

## 2015-08-02 DIAGNOSIS — R1013 Epigastric pain: Secondary | ICD-10-CM | POA: Diagnosis not present

## 2015-08-02 DIAGNOSIS — R110 Nausea: Secondary | ICD-10-CM | POA: Diagnosis not present

## 2015-08-02 MED ORDER — LORAZEPAM 2 MG/ML IJ SOLN
1.0000 mg | INTRAMUSCULAR | Status: DC | PRN
  Administered 2015-08-02: 1 mg via INTRAMUSCULAR
  Filled 2015-08-02: qty 1

## 2015-08-02 MED ORDER — HYDROMORPHONE HCL 1 MG/ML IJ SOLN
1.0000 mg | INTRAMUSCULAR | Status: DC | PRN
  Administered 2015-08-02 (×2): 1 mg via INTRAMUSCULAR
  Filled 2015-08-02 (×3): qty 1

## 2015-08-02 MED ORDER — HYDROMORPHONE HCL 1 MG/ML IJ SOLN
1.0000 mg | INTRAMUSCULAR | Status: DC | PRN
  Administered 2015-08-02 – 2015-08-03 (×5): 1 mg via INTRAVENOUS
  Filled 2015-08-02 (×5): qty 1

## 2015-08-02 MED ORDER — ONDANSETRON HCL 4 MG/2ML IJ SOLN
4.0000 mg | Freq: Three times a day (TID) | INTRAMUSCULAR | Status: DC
  Administered 2015-08-02 – 2015-08-03 (×4): 4 mg via INTRAVENOUS
  Filled 2015-08-02 (×5): qty 2

## 2015-08-02 MED ORDER — PANTOPRAZOLE SODIUM 40 MG PO TBEC
40.0000 mg | DELAYED_RELEASE_TABLET | Freq: Two times a day (BID) | ORAL | Status: DC
  Administered 2015-08-02 – 2015-08-04 (×4): 40 mg via ORAL
  Filled 2015-08-02 (×4): qty 1

## 2015-08-02 NOTE — Care Management Obs Status (Signed)
[place] NOTIFICATION   Patient Details  Name: [name] MRN: [id number] Date of Birth: 04/14/1956   Medicare Observation Status Notification Given:  Yes Copy placed in chart.    Briant Sites, RN 08/02/2015, 1:59 PM

## 2015-08-02 NOTE — Progress Notes (Signed)
Subjective: She was admitted yesterday with abdominal pain and nausea. She has an abdominal mass that has gotten larger. She says she is still nauseated. She is not having any diarrhea  Objective: Vital signs in last 24 hours: Temp:  [98.2 F (36.8 C)-98.7 F (37.1 C)] 98.5 F (36.9 C) (11/27 0800) Pulse Rate:  [46-69] 58 (11/27 0800) Resp:  [16-18] 18 (11/27 0800) BP: (88-158)/(36-71) 111/59 mmHg (11/27 0800) SpO2:  [93 %-99 %] 99 % (11/27 0800) Weight:  [81.784 kg (180 lb 4.8 oz)] 81.784 kg (180 lb 4.8 oz) (11/26 1258) Weight change:  Last BM Date: 07/31/15  Intake/Output from previous day: 11/26 0701 - 11/27 0700 In: 150 [P.O.:150] Out: -   PHYSICAL EXAM General appearance: alert, cooperative and mild distress Resp: clear to auscultation bilaterally Cardio: regular rate and rhythm, S1, S2 normal, no murmur, click, rub or gallop GI: Mildly diffusely tender Extremities: extremities normal, atraumatic, no cyanosis or edema  Lab Results:  Results for orders placed or performed during the hospital encounter of 08/01/15 (from the past 48 hour(s))  Comprehensive metabolic panel     Status: Abnormal   Collection Time: 08/01/15  8:05 AM  Result Value Ref Range   Sodium 140 135 - 145 mmol/L   Potassium 3.5 3.5 - 5.1 mmol/L   Chloride 109 101 - 111 mmol/L   CO2 20 (L) 22 - 32 mmol/L   Glucose, Bld 157 (H) 65 - 99 mg/dL   BUN 19 6 - 20 mg/dL   Creatinine, Ser 4.52 (H) 0.44 - 1.00 mg/dL   Calcium 9.6 8.9 - 17.8 mg/dL   Total Protein 7.7 6.5 - 8.1 g/dL   Albumin 4.3 3.5 - 5.0 g/dL   AST 28 15 - 41 U/L   ALT 25 14 - 54 U/L   Alkaline Phosphatase 81 38 - 126 U/L   Total Bilirubin 1.2 0.3 - 1.2 mg/dL   GFR calc non Af Amer 52 (L) >60 mL/min   GFR calc Af Amer 60 (L) >60 mL/min    Comment: (NOTE) The eGFR has been calculated using the CKD EPI equation. This calculation has not been validated in all clinical situations. eGFR's persistently <60 mL/min signify possible Chronic  Kidney Disease.    Anion gap 11 5 - 15  CBC with Differential/Platelet     Status: Abnormal   Collection Time: 08/01/15  8:05 AM  Result Value Ref Range   WBC 14.1 (H) 4.0 - 10.5 K/uL   RBC 4.70 3.87 - 5.11 MIL/uL   Hemoglobin 14.5 12.0 - 15.0 g/dL   HCT 02.9 11.5 - 52.9 %   MCV 91.9 78.0 - 100.0 fL   MCH 30.9 26.0 - 34.0 pg   MCHC 33.6 30.0 - 36.0 g/dL   RDW 19.7 77.1 - 54.2 %   Platelets 365 150 - 400 K/uL   Neutrophils Relative % 79 %   Neutro Abs 11.3 (H) 1.7 - 7.7 K/uL   Lymphocytes Relative 12 %   Lymphs Abs 1.6 0.7 - 4.0 K/uL   Monocytes Relative 8 %   Monocytes Absolute 1.1 (H) 0.1 - 1.0 K/uL   Eosinophils Relative 1 %   Eosinophils Absolute 0.1 0.0 - 0.7 K/uL   Basophils Relative 0 %   Basophils Absolute 0.0 0.0 - 0.1 K/uL  Troponin I     Status: None   Collection Time: 08/01/15  8:05 AM  Result Value Ref Range   Troponin I <0.03 <0.031 ng/mL    Comment:  NO INDICATION OF MYOCARDIAL INJURY.     ABGS No results for input(s): PHART, PO2ART, TCO2, HCO3 in the last 72 hours.  Invalid input(s): PCO2 CULTURES No results found for this or any previous visit (from the past 240 hour(s)). Studies/Results: Dg Abd Acute W/chest  08/01/2015  CLINICAL DATA:  Nausea, vomiting and diarrhea since 07/28/2015. Initial encounter. EXAM: DG ABDOMEN ACUTE W/ 1V CHEST COMPARISON:  CT abdomen and pelvis 07/30/2015. FINDINGS: Single view of the chest demonstrates clear lungs and normal heart size. No pneumothorax or pleural effusion. Aortic atherosclerosis is noted. Two views of the abdomen show no free intraperitoneal air. Multiple surgical clips are seen in the left upper quadrant of the abdomen and there are sutures and clips in the right upper quadrant. The bowel gas pattern is nonobstructive. There is mild convex left lumbar scoliosis. IMPRESSION: No acute finding chest or abdomen. Electronically Signed   By: Inge Rise M.D.   On: 08/01/2015 09:05    Medications:   Prior to Admission:  Prescriptions prior to admission  Medication Sig Dispense Refill Last Dose  . buPROPion (WELLBUTRIN SR) 150 MG 12 hr tablet Take 150 mg by mouth daily.   Past Week at Unknown time  . calcium-vitamin D 250-100 MG-UNIT per tablet Take 1 tablet by mouth daily.    Past Week at Unknown time  . FLUoxetine (PROZAC) 40 MG capsule Take 40 mg by mouth daily.    Past Week at Unknown time  . LINZESS 290 MCG CAPS capsule TAKE ONE CAPSULE BY MOUTH DAILY 30 MINUTES BEFORE BREAKFAST. 30 capsule 11 Past Week at Unknown time  . ondansetron (ZOFRAN) 4 MG tablet Take 1 tablet (4 mg total) by mouth every 6 (six) hours. 12 tablet 0 Past Week at Unknown time  . polyethylene glycol powder (GLYCOLAX/MIRALAX) powder One capful daily. (Patient taking differently: Take 1 Container by mouth daily as needed for mild constipation. ) 527 g 3 Past Week at Unknown time  . risperiDONE (RISPERDAL) 2 MG tablet Take 2 mg by mouth at bedtime.   Past Week at Unknown time  . traZODone (DESYREL) 150 MG tablet Take 300 mg by mouth at bedtime.   Past Week at Unknown time  . cyclobenzaprine (FLEXERIL) 5 MG tablet Take 1 tablet (5 mg total) by mouth 3 (three) times daily as needed for muscle spasms. (Patient not taking: Reported on 12/12/2014) 30 tablet 0 Not Taking  . naproxen (NAPROSYN) 500 MG tablet Take 1 tablet (500 mg total) by mouth 2 (two) times daily. (Patient not taking: Reported on 12/12/2014) 20 tablet 0 Not Taking   Scheduled: . FLUoxetine  40 mg Oral Daily  . heparin  5,000 Units Subcutaneous 3 times per day  . risperiDONE  2 mg Oral QHS  . traZODone  300 mg Oral QHS   Continuous: . dextrose 5 % and 0.9 % NaCl with KCl 20 mEq/L Stopped (08/02/15 0001)   [id number] (DILAUDID) injection, LORazepam, ondansetron **OR** ondansetron (ZOFRAN) IV  Assesment: She was admitted with abdominal pain nausea and an increased abdominal mass. Her situation is complicated by the fact that she has schizoaffective  disorder but she seems pretty stable with that. Active Problems:   Schizoaffective disorder (HCC)   Abdominal pain   Nausea   Abdominal mass    Plan: Continue current medications. GI consultation.      Elener Custodio L 08/02/2015, 10:54 AM

## 2015-08-02 NOTE — Consult Note (Signed)
Referring Provider: No ref. provider found Primary Care Physician:  Alonza Bogus, MD Primary Gastroenterologist:  Barney Drain  Reason for Consultation:  LIVER MASS   Impression: ADMITTED WITH VOMITING. CT SHOWS MASS IN LIVER. I PERSONALLY REVIEWED THE CT WITH DR. Leonia Reeves. LESION SLIGHTLY LARGER THAN IN [facility] 2016. NOT AMENABLE TO BIOPSY VIA EUS. CT GUIDED BIOPSY WOULD NEED TO BE THROUGH THE LIVER INCREASING RISK OF COMPLICATIONS.  Plan: 1. DR. Gala Romney WILL SEE PT NOV 28 AND DISCUSS MANAGEMENT OPTIONS WITH PT. 2. SUPPORTIVE CARE 3. ADVANCE DIET AS TOLERATED. ADD ZOFRAN ATC 4. ADD BID PPI WHILE IN HOUSE.  GREATER THAN 50% WAS SPENT IN COUNSELING & COORDINATION OF CARE WITH THE PATIENT: DISCUSSED DIFFERENTIAL DIAGNOSIS, PROCEDURE, BENEFITS, RISKS, AND MANAGEMENT OF LIVER TUMOR/NAUSEA/VOMITING. TOTAL ENCOUNTER TIME: 76 MINS.     HPI:  PT HAS KNOWN HISTORYOF CIRRHOSIS. LAST EGD 2015-NORMAL ESOPHAGUS, GASTRITIS. LAST SEEN IN [facility] APR 2016 FOR CIRRHOSIS AND LIVER MASS. AT THAT VISIT I WAS NOTED THAT THE MRI FROM [facility] 2016 demonstrated a stable lesion between the pancreas and the liver most consistent with a lymph node.ALS NOTED WAS A a soft tissue mass at the upper extent of the reaches of the abdominal MRI & liver parenchyma looked normal.  PT PRESENTED TO ED WITH NAUSEA/VOMTIING/ABDOMINAL PAIN SINCE TUES.VOMITED MULTIPLE TIMES ON TUE, WED, THUR, AND SAT. WAS TAKING LINZESS FOR CONSTIPATION. HAD DIARRHEA AFTER TAKING LINZESS ON WED AND HASN'T TAKEN ANOTHER DOSE. ABDOMINAL  PAIN/NASUEA/VOMITING HAS IMPROVED SINCE ADMISSION.   PT DENIES FEVER, CHILLS, HEMATOCHEZIA, HEMATEMESIS, melena, diarrhea, CHEST PAIN, SHORTNESS OF BREATH, CHANGE IN BOWEL IN HABITS, problems swallowing, OR heartburn or indigestion.  Past Medical History  Diagnosis Date  . Depression   . Chronic constipation   . Diverticulosis 01/31/2008    colonoscopy Dr Gala Romney  . Obesity   . Schizoaffective disorder     Mental  Health-Dr Elias Else  . PUD (peptic ulcer disease) 1988  . Colonic inertia   . Hyperplastic colon polyp   . Diverticula of colon   . Cirrhosis ([place])     child pugh class A and MELD 5, completed hep A and B vaccine series, no HCC on 3/16 MRI    Past Surgical History  Procedure Laterality Date  . Tonsillectomy    . S/p hysterectomy      partial  . Gastric bypass      with Revision, Dr. Duffy Rhody  . Wrist surgery    . Foot surgery      left  . Cholecystectomy  08/2010    cholelithiasis; Dr Arnoldo Morale  . Colonoscopy  01/31/2008    Dr. Gala Romney- normal rectum, L sided diverticula, long tortuous colon. hyperplasitic  polyp.  . Ercp w/ sphincterotomy and balloon dilation  08/05/2010    normal ampulla/mild erythema in the gastric remnant without ulcerations or erosions. normal retroflexed view of the cardia/anastomosis normal/distal common bile duct stones s/p extraction and shincterotomy  . Esophagogastroduodenoscopy N/A 10/24/2013    [id number] esophagus. Surgically altered stomach. Abnormal gastric mucosa  -  status post biopsy (reactive gastrophathy)    Prior to Admission medications   Medication Sig Start Date End Date Taking? Authorizing Provider  buPROPion (WELLBUTRIN SR) 150 MG 12 hr tablet Take 150 mg by mouth daily. 07/20/15  Yes Historical Provider, MD  calcium-vitamin D 250-100 MG-UNIT per tablet Take 1 tablet by mouth daily.    Yes Historical Provider, MD  FLUoxetine (PROZAC) 40 MG capsule Take 40 mg by mouth daily.  06/06/12  Yes Historical  Provider, MD  LINZESS 290 MCG CAPS capsule TAKE ONE CAPSULE BY MOUTH DAILY 30 MINUTES BEFORE BREAKFAST. 07/20/15  Yes Carlis Stable, NP  ondansetron (ZOFRAN) 4 MG tablet Take 1 tablet (4 mg total) by mouth every 6 (six) hours. 07/30/15  Yes Ezequiel Essex, MD  polyethylene glycol powder (GLYCOLAX/MIRALAX) powder One capful daily. Patient taking differently: Take 1 Container by mouth daily as needed for mild constipation.  05/15/14  Yes Mahala Menghini,  PA-C  risperiDONE (RISPERDAL) 2 MG tablet Take 2 mg by mouth at bedtime.   Yes Historical Provider, MD  traZODone (DESYREL) 150 MG tablet Take 300 mg by mouth at bedtime. 07/20/15  Yes Historical Provider, MD  cyclobenzaprine (FLEXERIL) 5 MG tablet Take 1 tablet (5 mg total) by mouth 3 (three) times daily as needed for muscle spasms. Patient not taking: Reported on 12/12/2014 06/20/14   [facility] Bunnie Pion, NP  naproxen (NAPROSYN) 500 MG tablet Take 1 tablet (500 mg total) by mouth 2 (two) times daily. Patient not taking: Reported on 12/12/2014 06/20/14   Ashley Murrain, NP    Current Facility-Administered Medications  Medication Dose Route Frequency Provider Last Rate Last Dose  . dextrose 5 % and 0.9 % NaCl with KCl 20 mEq/L infusion   Intravenous Continuous Orvan Falconer, MD   Stopped at 08/02/15 0001  . FLUoxetine (PROZAC) capsule 40 mg  40 mg Oral Daily Orvan Falconer, MD   40 mg at 08/02/15 0934  . heparin injection 5,000 Units  5,000 Units Subcutaneous 3 times per day Orvan Falconer, MD   5,000 Units at 08/02/15 0610  . HYDROmorphone (DILAUDID) injection 1 mg  1 mg Intramuscular Q4H PRN Asencion Noble, MD   1 mg at 08/02/15 0943  . LORazepam (ATIVAN) injection 1 mg  1 mg Intramuscular Q4H PRN Asencion Noble, MD   1 mg at 08/02/15 0148  . ondansetron (ZOFRAN) tablet 4 mg  4 mg Oral Q6H PRN Orvan Falconer, MD   4 mg at 08/02/15 0610   Or  . ondansetron [facility]) injection 4 mg  4 mg Intravenous Q6H PRN Orvan Falconer, MD   4 mg at 08/01/15 2030  . risperiDONE (RISPERDAL) tablet 2 mg  2 mg Oral QHS Orvan Falconer, MD   2 mg at 08/01/15 2252  . traZODone (DESYREL) tablet 300 mg  300 mg Oral QHS Orvan Falconer, MD   300 mg at 08/01/15 2252    Allergies as of 08/01/2015 - Review Complete 08/01/2015  Allergen Reaction Noted  . Ciprofloxacin Rash     Family History  Problem Relation Age of Onset  . Colon cancer Maternal Uncle 90  . Diabetes Father   . Liver disease Neg Hx      Social History   Social History  . Marital Status: Divorced     Spouse Name: N/A  . Number of Children: 2  . Years of Education: N/A   Occupational History  . disabled    Social History Main Topics  . Smoking status: Former Smoker -- 1.00 packs/day for 30 years    Types: Cigarettes    Quit date: 07/06/2015  . Smokeless tobacco: Former Systems developer     Comment: Quit smoking x 10 years  . Alcohol Use: No  . Drug Use: No  . Sexual Activity: Not on file   Other Topics Concern  . Not on file   Social History Narrative   Lives w/ mother   2 grown children    Review of Systems: PER  HPI OTHERWISE ALL SYSTEMS ARE NEGATIVE.   Vitals: Blood pressure 111/59, pulse 58, temperature 98.5 F (36.9 C), temperature source Oral, resp. rate 18, height 5\' 4"  (1.626 m), weight 180 lb 4.8 oz (81.784 kg), SpO2 99 %.  Physical Exam: General:   Alert,  Well-developed, well-nourished, pleasant and cooperative in NAD Head:  Normocephalic and atraumatic. Eyes:  Sclera clear, no icterus.   Conjunctiva pink. Mouth:  No lesions, dentition ABnormal. Neck:  Supple; no masses. Lungs:  Clear throughout to auscultation.   No wheezes. No acute distress. Heart:  Regular rate and rhythm; no murmurs. Abdomen:  Soft, tender (mild-RUQ) and nondistended. No masses noted. Normal bowel sounds, without guarding, and without rebound.   Msk:  Symmetrical without gross deformities. Normal posture. Extremities:  Without edema. Neurologic:  Alert and  oriented x4;  grossly normal neurologically. Cervical Nodes:  No significant cervical adenopathy. Psych:  Alert and cooperative. Normal mood and affect.   Lab Results:  Recent Labs  07/30/15 1506 08/01/15 0805  WBC 13.5* 14.1*  HGB 13.7 14.5  HCT 40.8 43.2  PLT 317 365   BMET  Recent Labs  07/30/15 1506 08/01/15 0805  NA 140 140  K 4.2 3.5  CL 107 109  CO2 22 20*  GLUCOSE 187* 157*  BUN 16 19  CREATININE 1.03* 1.14*  CALCIUM 9.8 9.6   LFT  Recent Labs  08/01/15 0805  PROT 7.7  ALBUMIN 4.3  AST 28  ALT 25   ALKPHOS 81  BILITOT 1.2     Studies/Results: CT SCAN NOV 2016: MASS IN PORTA HEPATIS    Jernie Schutt  08/02/2015, 12:40 PM

## 2015-08-02 NOTE — Progress Notes (Addendum)
Notified MD, unable to obtain iv access in pt. MD states to leave i.v. Out until in the a.m. MD ordered to change route of i.v. Pain medicine and ativan to i.m.

## 2015-08-03 DIAGNOSIS — R1900 Intra-abdominal and pelvic swelling, mass and lump, unspecified site: Secondary | ICD-10-CM

## 2015-08-03 DIAGNOSIS — J441 Chronic obstructive pulmonary disease with (acute) exacerbation: Secondary | ICD-10-CM | POA: Diagnosis present

## 2015-08-03 DIAGNOSIS — R112 Nausea with vomiting, unspecified: Secondary | ICD-10-CM | POA: Diagnosis present

## 2015-08-03 DIAGNOSIS — R1013 Epigastric pain: Secondary | ICD-10-CM

## 2015-08-03 DIAGNOSIS — Z833 Family history of diabetes mellitus: Secondary | ICD-10-CM | POA: Diagnosis not present

## 2015-08-03 DIAGNOSIS — Z9884 Bariatric surgery status: Secondary | ICD-10-CM | POA: Diagnosis not present

## 2015-08-03 DIAGNOSIS — R109 Unspecified abdominal pain: Secondary | ICD-10-CM | POA: Diagnosis present

## 2015-08-03 DIAGNOSIS — Z87891 Personal history of nicotine dependence: Secondary | ICD-10-CM | POA: Diagnosis not present

## 2015-08-03 DIAGNOSIS — K7460 Unspecified cirrhosis of liver: Secondary | ICD-10-CM | POA: Diagnosis present

## 2015-08-03 DIAGNOSIS — Z800 Family history of malignant neoplasm of digestive organs: Secondary | ICD-10-CM | POA: Diagnosis not present

## 2015-08-03 DIAGNOSIS — R739 Hyperglycemia, unspecified: Secondary | ICD-10-CM | POA: Diagnosis present

## 2015-08-03 DIAGNOSIS — E860 Dehydration: Secondary | ICD-10-CM | POA: Diagnosis present

## 2015-08-03 DIAGNOSIS — F259 Schizoaffective disorder, unspecified: Secondary | ICD-10-CM | POA: Diagnosis present

## 2015-08-03 DIAGNOSIS — R1907 Generalized intra-abdominal and pelvic swelling, mass and lump: Secondary | ICD-10-CM | POA: Diagnosis not present

## 2015-08-03 MED ORDER — ONDANSETRON 4 MG PO TBDP
4.0000 mg | ORAL_TABLET | Freq: Three times a day (TID) | ORAL | Status: DC
  Administered 2015-08-03 – 2015-08-04 (×3): 4 mg via ORAL
  Filled 2015-08-03 (×3): qty 1

## 2015-08-03 NOTE — Care Management Note (Signed)
Case Management Note  Patient Details  Name: [name] MRN: [id number] Date of Birth: [birth year]  Subjective/Objective:                  Pt admitted from home with abd pain, nausea/vomiting. Pt lives with her son and will return home at discharge. Pt is independent with ADL's.  Action/Plan: No Cm needs noted.  Expected Discharge Date:                  Expected Discharge Plan:  Home/Self Care  In-House Referral:  NA  Discharge planning Services  CM Consult  Post Acute Care Choice:  NA Choice offered to:  NA  DME Arranged:    DME Agency:     HH Arranged:    HH Agency:     Status of Service:  Completed, signed off  Medicare Important Message Given:    Date Medicare IM Given:    Medicare IM give by:    Date Additional Medicare IM Given:    Additional Medicare Important Message give by:     If discussed at [place] of Stay Meetings, dates discussed:    Additional Comments:  Joylene Draft, RN 08/03/2015, 1:57 PM

## 2015-08-03 NOTE — Progress Notes (Addendum)
Subjective: She still has some abdominal pain and nausea. GI consultation noted and appreciated  Objective: Vital signs in last 24 hours: Temp:  [98.2 F (36.8 C)-98.7 F (37.1 C)] 98.2 F (36.8 C) (11/28 0641) Pulse Rate:  [79-83] 82 (11/28 0641) Resp:  [20] 20 (11/28 0641) BP: (100-115)/(53-60) 100/58 mmHg (11/28 0641) SpO2:  [97 %-99 %] 97 % (11/28 0641) Weight change:  Last BM Date: 07/31/15  Intake/Output from previous day: 11/27 0701 - 11/28 0700 In: 140 [P.O.:140] Out: -   PHYSICAL EXAM General appearance: alert, cooperative, mild distress and morbidly obese Resp: clear to auscultation bilaterally Cardio: regular rate and rhythm, S1, S2 normal, no murmur, click, rub or gallop GI: She is tender in the midepigastric area Extremities: extremities normal, atraumatic, no cyanosis or edema  Lab Results:  No results found for this or any previous visit (from the past 48 hour(s)).  ABGS No results for input(s): PHART, PO2ART, TCO2, HCO3 in the last 72 hours.  Invalid input(s): PCO2 CULTURES No results found for this or any previous visit (from the past 240 hour(s)). Studies/Results: No results found.  Medications:  Prior to Admission:  Prescriptions prior to admission  Medication Sig Dispense Refill Last Dose  . buPROPion (WELLBUTRIN SR) 150 MG 12 hr tablet Take 150 mg by mouth daily.   Past Week at Unknown time  . calcium-vitamin D 250-100 MG-UNIT per tablet Take 1 tablet by mouth daily.    Past Week at Unknown time  . FLUoxetine (PROZAC) 40 MG capsule Take 40 mg by mouth daily.    Past Week at Unknown time  . LINZESS 290 MCG CAPS capsule TAKE ONE CAPSULE BY MOUTH DAILY 30 MINUTES BEFORE BREAKFAST. 30 capsule 11 Past Week at Unknown time  . ondansetron (ZOFRAN) 4 MG tablet Take 1 tablet (4 mg total) by mouth every 6 (six) hours. 12 tablet 0 Past Week at Unknown time  . polyethylene glycol powder (GLYCOLAX/MIRALAX) powder One capful daily. (Patient taking differently:  Take 1 Container by mouth daily as needed for mild constipation. ) 527 g 3 Past Week at Unknown time  . risperiDONE (RISPERDAL) 2 MG tablet Take 2 mg by mouth at bedtime.   Past Week at Unknown time  . traZODone (DESYREL) 150 MG tablet Take 300 mg by mouth at bedtime.   Past Week at Unknown time  . cyclobenzaprine (FLEXERIL) 5 MG tablet Take 1 tablet (5 mg total) by mouth 3 (three) times daily as needed for muscle spasms. (Patient not taking: Reported on 12/12/2014) 30 tablet 0 Not Taking  . naproxen (NAPROSYN) 500 MG tablet Take 1 tablet (500 mg total) by mouth 2 (two) times daily. (Patient not taking: Reported on 12/12/2014) 20 tablet 0 Not Taking   Scheduled: . FLUoxetine  40 mg Oral Daily  . heparin  5,000 Units Subcutaneous 3 times per day  . ondansetron (ZOFRAN) IV  4 mg Intravenous TID WC & HS  . pantoprazole  40 mg Oral BID AC  . risperiDONE  2 mg Oral QHS  . traZODone  300 mg Oral QHS   Continuous: . dextrose 5 % and 0.9 % NaCl with KCl 20 mEq/L 100 mL/hr at 08/03/15 [id number]   [id number] (DILAUDID) injection, LORazepam, ondansetron **OR** ondansetron (ZOFRAN) IV  Assesment: She was admitted with nausea and abdominal pain. She has a liver mass. She is better but her nausea is still present. Active Problems:   Schizoaffective disorder (HCC)   Abdominal pain   Nausea   Abdominal mass  Plan: Continue current treatments. GI help appreciated. Further recommendations to follow from them. She still needs inpatient care, iv meds and iv fluids due to decreased po intake.      Johari Bennetts L 08/03/2015, 9:10 AM

## 2015-08-03 NOTE — Progress Notes (Signed)
    Subjective: Abdominal pain improved. Nausea resolved. Tolerated breakfast this morning. Would like to advance diet.   Objective: Vital signs in last 24 hours: Temp:  [98.2 F (36.8 C)-98.7 F (37.1 C)] 98.2 F (36.8 C) (11/28 0641) Pulse Rate:  [79-83] 82 (11/28 0641) Resp:  [20] 20 (11/28 0641) BP: (100-115)/(53-60) 100/58 mmHg (11/28 0641) SpO2:  [97 %-99 %] 97 % (11/28 0641) Last BM Date: 07/31/15 General:   Alert and oriented, pleasant Head:  Normocephalic and atraumatic. Abdomen:  Bowel sounds present, soft, non-tender, non-distended. No HSM or hernias noted. No rebound or guarding. No masses appreciated  Msk:  Symmetrical without gross deformities. Normal posture. Extremities:  Without  edema. Neurologic:  Alert and  oriented x4;  grossly normal neurologically. Psych:  Alert and cooperative. Normal mood and affect.  Intake/Output from previous day: 11/27 0701 - 11/28 0700 In: 140 [P.O.:140] Out: -  Intake/Output this shift:    Lab Results:  Recent Labs  08/01/15 0805  WBC 14.1*  HGB 14.5  HCT 43.2  PLT 365   BMET  Recent Labs  08/01/15 0805  NA 140  K 3.5  CL 109  CO2 20*  GLUCOSE 157*  BUN 19  CREATININE 1.14*  CALCIUM 9.6   LFT  Recent Labs  08/01/15 0805  PROT 7.7  ALBUMIN 4.3  AST 28  ALT 25  ALKPHOS 81  BILITOT 1.2     Studies/Results: Dg Abd Acute W/chest  08/01/2015  CLINICAL DATA:  Nausea, vomiting and diarrhea since 07/28/2015. Initial encounter. EXAM: DG ABDOMEN ACUTE W/ 1V CHEST COMPARISON:  CT abdomen and pelvis 07/30/2015. FINDINGS: Single view of the chest demonstrates clear lungs and normal heart size. No pneumothorax or pleural effusion. Aortic atherosclerosis is noted. Two views of the abdomen show no free intraperitoneal air. Multiple surgical clips are seen in the left upper quadrant of the abdomen and there are sutures and clips in the right upper quadrant. The bowel gas pattern is nonobstructive. There is mild  convex left lumbar scoliosis. IMPRESSION: No acute finding chest or abdomen. Electronically Signed   By: Inge Rise M.D.   On: 08/01/2015 09:05    Assessment: 59 year old female with history of cirrhosis, gastric bypass  followed by our office, with prior diagnosis of intra-abdominal lesion likely representing a lymph node with last MRI in March 2016. Admitted with vomiting and abdominal pain. Clinically improved with resolution of symptoms now. Doubt any relation to previous history of gastric bypass. Area previously felt to be a lymph node now increased from 4.1 cm to 5.8 cm on CT this admission. Will reach out to Dr. Ardis Hughs regarding possibly biopsy via EUS, although this may not be accessible.  Last EGD Feb 2015.   Plan: Advance to soft diet Change Zofran to oral Protonix twice a day while inpatient and reduce to once daily as an outpatient Will discuss with Dr. Ardis Hughs potential options Hopeful discharge today if tolerates diet, with close outpatient follow-up  Orvil Feil, ANP-BC [facility]       08/03/2015, 8:09 AM    Reviewed with Dr. Ardis Hughs. Should be possible to biopsy with EUS, FNA. With history of gastric bypass, may be difficult due to anatomy. Recommended referral to surgery due to enlargement. We will arrange outpatient EUS with Dr. Ardis Hughs.  Orvil Feil, ANP-BC [facility]

## 2015-08-04 ENCOUNTER — Telehealth: Payer: Self-pay | Admitting: Gastroenterology

## 2015-08-04 ENCOUNTER — Other Ambulatory Visit: Payer: Self-pay

## 2015-08-04 DIAGNOSIS — IMO0002 Reserved for concepts with insufficient information to code with codable children: Secondary | ICD-10-CM

## 2015-08-04 DIAGNOSIS — R229 Localized swelling, mass and lump, unspecified: Secondary | ICD-10-CM

## 2015-08-04 DIAGNOSIS — R9389 Abnormal findings on diagnostic imaging of other specified body structures: Secondary | ICD-10-CM

## 2015-08-04 MED ORDER — ALPRAZOLAM 0.5 MG PO TABS: 0.5000 mg | ORAL_TABLET | Freq: Three times a day (TID) | ORAL | Status: DC | PRN

## 2015-08-04 MED ORDER — PANTOPRAZOLE SODIUM 40 MG PO TBEC: 40.0000 mg | DELAYED_RELEASE_TABLET | Freq: Every day | ORAL | Status: DC

## 2015-08-04 MED ORDER — OXYCODONE HCL 5 MG PO TABS
5.0000 mg | ORAL_TABLET | Freq: Four times a day (QID) | ORAL | Status: DC | PRN
  Administered 2015-08-04: 10 mg via ORAL
  Filled 2015-08-04: qty 2

## 2015-08-04 NOTE — Care Management Note (Signed)
Case Management Note  Patient Details  Name: SUMAYA GORTER MRN: [id number] Date of Birth: Mar 14, 1956  Subjective/Objective:                    Action/Plan:   Expected Discharge Date:                  Expected Discharge Plan:  Home/Self Care  In-House Referral:  NA  Discharge planning Services  CM Consult  Post Acute Care Choice:  NA Choice offered to:  NA  DME Arranged:    DME Agency:     HH Arranged:    HH Agency:     Status of Service:  Completed, signed off  Medicare Important Message Given:  Yes Date Medicare IM Given:    Medicare IM give by:    Date Additional Medicare IM Given:    Additional Medicare Important Message give by:     If discussed at [place] of Stay Meetings, dates discussed:    Additional Comments: Pt discharged home today. No CM needs noted. Christinia Gully [place], RN 08/04/2015, 9:28 AM

## 2015-08-04 NOTE — Care Management Important Message (Signed)
Important Message  Patient Details  Name: FRIMY YUH MRN: [id number] Date of Birth: Apr 08, 1956   Medicare Important Message Given:  Yes    Joylene Draft, RN 08/04/2015, 9:17 AM

## 2015-08-04 NOTE — Progress Notes (Signed)
IV came out and unable to regain IV access.  MD notified.

## 2015-08-04 NOTE — Progress Notes (Signed)
Patient did not have IV access. Discharge instructions reviewed with patient. Appointment made for patient and details given. Understanding verbalized. Awaiting ride for discharge home.

## 2015-08-04 NOTE — Progress Notes (Signed)
She feels better. Her abdominal pain is essentially resolved. She is not nauseated. She was able to eat and keep that down. She wants to go home and will be discharged home today.  Exam shows she is awake and alert. Her chest shows some rhonchi but she has COPD at baseline. Her abdomen is still minimally tender but much improved from admission

## 2015-08-04 NOTE — Telephone Encounter (Signed)
Please arrange for EUS with FNA biopsy with Dr. Ardis Hughs due to CT on 11/24 noting 5.8 cm mass in the porta hepatis abutting the gastric antrum and pancreas, which is mildly increased in size since previous studies Please also refer to [facility] due to enlarging mass.

## 2015-08-05 NOTE — Telephone Encounter (Signed)
Referrals made

## 2015-08-07 ENCOUNTER — Other Ambulatory Visit: Payer: Self-pay

## 2015-08-07 ENCOUNTER — Telehealth: Payer: Self-pay

## 2015-08-07 DIAGNOSIS — R1900 Intra-abdominal and pelvic swelling, mass and lump, unspecified site: Secondary | ICD-10-CM

## 2015-08-07 NOTE — Telephone Encounter (Signed)
Pt scheduled for EUS at [facility] 08/20/15@10 :45am, pt to arrive there at 9:15am, pt to be NPO after midnight. Pt aware of appt.

## 2015-08-08 NOTE — Discharge Summary (Signed)
Physician Discharge Summary  Patient ID: [name] MRN: [id number] DOB/AGE: 09-25-1955 59 y.o. Primary Care Physician:Makenzie Weisner L, MD Admit date: 08/01/2015 Discharge date: 08/08/2015    Discharge Diagnoses:   Active Problems:   Schizoaffective disorder (HCC)   Abdominal pain   Nausea   Abdominal mass     Medication List    TAKE these medications        buPROPion 150 MG 12 hr tablet  Commonly known as:  WELLBUTRIN SR  Take 150 mg by mouth daily.     calcium-vitamin D 250-100 MG-UNIT tablet  Take 1 tablet by mouth daily.     cyclobenzaprine 5 MG tablet  Commonly known as:  FLEXERIL  Take 1 tablet (5 mg total) by mouth 3 (three) times daily as needed for muscle spasms.     FLUoxetine 40 MG capsule  Commonly known as:  PROZAC  Take 40 mg by mouth daily.     LINZESS 290 MCG Caps capsule  Generic drug:  Linaclotide  TAKE ONE CAPSULE BY MOUTH DAILY 30 MINUTES BEFORE BREAKFAST.     naproxen 500 MG tablet  Commonly known as:  NAPROSYN  Take 1 tablet (500 mg total) by mouth 2 (two) times daily.     ondansetron 4 MG tablet  Commonly known as:  ZOFRAN  Take 1 tablet (4 mg total) by mouth every 6 (six) hours.     pantoprazole 40 MG tablet  Commonly known as:  PROTONIX  Take 1 tablet (40 mg total) by mouth daily.     polyethylene glycol powder powder  Commonly known as:  GLYCOLAX/MIRALAX  One capful daily.     risperiDONE 2 MG tablet  Commonly known as:  RISPERDAL  Take 2 mg by mouth at bedtime.     traZODone 150 MG tablet  Commonly known as:  DESYREL  Take 300 mg by mouth at bedtime.        Discharged Condition: Improved    Consults: GI  Significant Diagnostic Studies: Ct Abdomen Pelvis W Contrast  07/30/2015  CLINICAL DATA:  Epigastric pain and nausea and vomiting for 3 days. Previous gastric bypass surgery. EXAM: CT ABDOMEN AND PELVIS WITH CONTRAST TECHNIQUE: Multidetector CT imaging of the abdomen and pelvis was performed using the  standard protocol following bolus administration of intravenous contrast. CONTRAST:  88mL OMNIPAQUE IOHEXOL 300 MG/ML  SOLN COMPARISON:  08/12/2013. FINDINGS: Lower chest:  No acute findings. Hepatobiliary: No liver masses identified. Prior cholecystectomy noted. No evidence of a biliary ductal dilatation. A homogeneous low-attenuation masses seen in the porta hepatis which measures 4.9 x 5.8 cm on image 24/series 2 compared to 4.5 x 5.6 cm previously. This abuts the gastric antrum and pancreatic neck. Pancreas: No mass, inflammatory changes, or other significant abnormality. Spleen: Within normal limits in size and appearance. Adrenals/Urinary Tract: No masses identified. No evidence of hydronephrosis. Stomach/Bowel: Postop changes from gastric bypass surgery again noted. No evidence of bowel obstruction normal appendix visualized. Vascular/Lymphatic: No pathologically enlarged lymph nodes. No evidence of abdominal aortic aneurysm. Reproductive: Prior hysterectomy noted. Adnexal regions are unremarkable in appearance. Other: None. Musculoskeletal:  No suspicious bone lesions identified. IMPRESSION: No acute findings identified within the abdomen or pelvis. 5.8 cm mass in the porta hepatis abutting the gastric antrum and pancreas, which is mildly increased in size since previous studies. Differential diagnosis includes GI stromal tumor, leiomyoma, lymphoma, and atypical pancreatic neoplasm such as islet cell tumor. Electronically Signed   By: Earle Gell M.D.   On: 07/30/2015  17:12   Dg Abd Acute W/chest  08/01/2015  CLINICAL DATA:  Nausea, vomiting and diarrhea since 07/28/2015. Initial encounter. EXAM: DG ABDOMEN ACUTE W/ 1V CHEST COMPARISON:  CT abdomen and pelvis 07/30/2015. FINDINGS: Single view of the chest demonstrates clear lungs and normal heart size. No pneumothorax or pleural effusion. Aortic atherosclerosis is noted. Two views of the abdomen show no free intraperitoneal air. Multiple surgical clips  are seen in the left upper quadrant of the abdomen and there are sutures and clips in the right upper quadrant. The bowel gas pattern is nonobstructive. There is mild convex left lumbar scoliosis. IMPRESSION: No acute finding chest or abdomen. Electronically Signed   By: Inge Rise M.D.   On: 08/01/2015 09:05    Lab Results: Basic Metabolic Panel: No results for input(s): NA, K, CL, CO2, GLUCOSE, BUN, CREATININE, CALCIUM, MG, PHOS in the last 72 hours. Liver Function Tests: No results for input(s): AST, ALT, ALKPHOS, BILITOT, PROT, ALBUMIN in the last 72 hours.   CBC: No results for input(s): WBC, NEUTROABS, HGB, HCT, MCV, PLT in the last 72 hours.  No results found for this or any previous visit (from the past 240 hour(s)).   Hospital Course: This is a 59 year old who came to the emergency department because of nausea vomiting and abdominal pain. She had CT of the abdomen that showed a mass lesion that she has had in her abdomen for some time and is larger. She was treated with IV fluids given anti-emetics and improved. GI consultation was obtained and it was felt that the mass was not really amenable to percutaneous biopsy so she is going to be referred for possible endoscopic ultrasound and biopsy in that fashion. She was much improved at the time of discharge able to keep food down her pain was under control  Discharge Exam: Blood pressure 106/74, pulse 59, temperature 98.9 F (37.2 C), temperature source Oral, resp. rate 20, height 5\' 4"  (1.626 m), weight 81.784 kg (180 lb 4.8 oz), SpO2 98 %. She is awake and alert. She looks in no acute distress. Her chest is clear. Abdomen is soft  Disposition: Home for follow-up with endoscopic ultrasound      Discharge Instructions    Discharge patient    Complete by:  As directed            Follow-up Information    Follow up with Mersadie Kavanaugh L, MD On 08/24/2015.   Specialty:  Pulmonary Disease   Why:  Monday 12/19 at 9:30 am    Contact information:   [address] [place] [address] [phone]       Signed: Kea Callan L   08/08/2015, 11:04 AM

## 2015-08-11 ENCOUNTER — Encounter (HOSPITAL_COMMUNITY): Payer: Self-pay | Admitting: *Deleted

## 2015-08-12 NOTE — Discharge Summary (Signed)
[name], [name]             ACCOUNT NO.:  [id number]  MEDICAL RECORD NO.:  [id number]  LOCATION:  [id number]                          FACILITY:  [facility]  PHYSICIAN:  Markon Jares L. Luan Pulling, M.D.DATE OF BIRTH:  [birth year]  DATE OF ADMISSION:  08/01/2015 DATE OF DISCHARGE:  11/29/2016LH                              DISCHARGE SUMMARY   ADDENDUM  FINAL DISCHARGE DIAGNOSES:  Dehydration and chronic obstructive pulmonary disease exacerbation.     Asim Gersten L. Luan Pulling, M.D.     ELH/MEDQ  D:  08/12/2015  T:  08/12/2015  Job:  [id number]

## 2015-08-20 ENCOUNTER — Ambulatory Visit (HOSPITAL_BASED_OUTPATIENT_CLINIC_OR_DEPARTMENT_OTHER)
Admission: RE | Admit: 2015-08-20 | Discharge: 2015-08-20 | Disposition: A | Discharge status: Home / Self Care | Payer: Medicare Other | Source: Ambulatory Visit | Attending: Gastroenterology | Admitting: Gastroenterology

## 2015-08-20 ENCOUNTER — Ambulatory Visit (HOSPITAL_COMMUNITY): Payer: Medicare Other | Admitting: Anesthesiology

## 2015-08-20 ENCOUNTER — Encounter (HOSPITAL_COMMUNITY): Payer: Self-pay | Admitting: *Deleted

## 2015-08-20 ENCOUNTER — Encounter (HOSPITAL_COMMUNITY)
Admission: RE | Disposition: A | Discharge status: Home / Self Care | Payer: Self-pay | Source: Ambulatory Visit | Attending: Gastroenterology

## 2015-08-20 DIAGNOSIS — R1900 Intra-abdominal and pelvic swelling, mass and lump, unspecified site: Secondary | ICD-10-CM

## 2015-08-20 DIAGNOSIS — K659 Peritonitis, unspecified: Secondary | ICD-10-CM | POA: Diagnosis not present

## 2015-08-20 HISTORY — PX: EUS: SHX5427

## 2015-08-20 SURGERY — ESOPHAGEAL ENDOSCOPIC ULTRASOUND (EUS) RADIAL
Anesthesia: Monitor Anesthesia Care

## 2015-08-20 MED ORDER — SODIUM CHLORIDE 0.9 % IV SOLN: INTRAVENOUS | Status: DC

## 2015-08-20 MED ORDER — PROPOFOL 10 MG/ML IV BOLUS
INTRAVENOUS | Status: AC
  Filled 2015-08-20: qty 40

## 2015-08-20 MED ORDER — LACTATED RINGERS IV SOLN
INTRAVENOUS | Status: DC
  Administered 2015-08-20: 1000 mL via INTRAVENOUS

## 2015-08-20 MED ORDER — ONDANSETRON HCL 4 MG/2ML IJ SOLN: 4.0000 mg | Freq: Once | INTRAMUSCULAR | Status: DC | PRN

## 2015-08-20 MED ORDER — FENTANYL CITRATE (PF) 100 MCG/2ML IJ SOLN: 25.0000 ug | INTRAMUSCULAR | Status: DC | PRN

## 2015-08-20 MED ORDER — PROPOFOL 500 MG/50ML IV EMUL
INTRAVENOUS | Status: DC | PRN
  Administered 2015-08-20: 100 ug/kg/min via INTRAVENOUS

## 2015-08-20 MED ORDER — PROPOFOL 500 MG/50ML IV EMUL
INTRAVENOUS | Status: DC | PRN
  Administered 2015-08-20: 30 mg via INTRAVENOUS
  Administered 2015-08-20: 40 mg via INTRAVENOUS

## 2015-08-20 NOTE — Op Note (Signed)
[facility] [place], [address]   ENDOSCOPIC ULTRASOUND PROCEDURE REPORT  PATIENT: [name], [name]  MR#: [id number] BIRTHDATE: August 08, 1956  GENDER: female ENDOSCOPIST: Milus Banister, MD REFERRED BY:  Garfield Cornea, M.D. PROCEDURE DATE:  08/20/2015 PROCEDURE:   Upper EUS w/FNA ASA CLASS:      Class III INDICATIONS:   periportal mass (has increased in size since 2013 imaging, present at least since 2011), now about 5cm. MEDICATIONS: Monitored anesthesia care  DESCRIPTION OF PROCEDURE:   After the risks benefits and alternatives of the procedure were  explained, informed consent was obtained. The patient was then placed in the left, lateral, decubitus postion and IV sedation was administered. Throughout the procedure, the patients blood pressure, pulse and oxygen saturations were monitored continuously.  Under direct visualization, the Pentax Radial EUS [id number]  endoscope was introduced through the mouth  and advanced to the second portion of the duodenum .  Water was used as necessary to provide an acoustic interface.  Upon completion of the imaging, water was removed and the patient was sent to the recovery room in satisfactory condition.  Endoscopic findings (limited views with radial and linear echoendoscopes): 1. Small distal esophagus varices. 2. Bilroth I type anatomy (s/p remote bariatric procedure)  EUS findings: 1. Well circumscribed, hypoechoic mass that abuts liver, pancreas but does not clear involve either organ. The mass measures 5.2cm maximally and was sampled with 3 transgastric pass with a 22 gauge EUS FNA needle.  During FNA, it was evident that the mass was not solid, rather contained gelatinous material. 2. Pancreatic parenchyma was normal throughout. 3. Normal main pancreatic duct 4. Gallbladder surgically absent. 5. Limited views of liver, spleen, portal and splenic vessels were all normal.    ENDOSCOPIC  IMPRESSION: Well circumscribed 5.2cm chronic mass in porta hepatis. The mass has been present at least since 2011 and is slowly growing.  It is not clear if it is causing any GI symptoms.  Internally the mass is NOT solid, rather filled with gelatinous material that was aspirated and sent to cytology.  Preliminary cytology review shows "cyst contents."  Await final testing results.  RECOMMENDATIONS: She is scheduled to meet Dr. Barry Dienes in 2-3 weeks, I will communicate these findings with her.  My suspicion for neoplasm is relatively low here.  _______________________________ eSigned:  Milus Banister, MD 08/20/2015 10:49 AM

## 2015-08-20 NOTE — Interval H&P Note (Signed)
History and Physical Interval Note:  08/20/2015 9:45 AM  [name]  has presented today for surgery, with the diagnosis of Abdominal mass  The various methods of treatment have been discussed with the patient and family. After consideration of risks, benefits and other options for treatment, the patient has consented to  Procedure(s): ESOPHAGEAL ENDOSCOPIC ULTRASOUND (EUS) RADIAL (N/A) as a surgical intervention .  The patient's history has been reviewed, patient examined, no change in status, stable for surgery.  I have reviewed the patient's chart and labs.  Questions were answered to the patient's satisfaction.     Milus Banister

## 2015-08-20 NOTE — Anesthesia Preprocedure Evaluation (Addendum)
Anesthesia Evaluation  Patient identified by MRN, date of birth, ID band Patient awake    Reviewed: Allergy & Precautions, H&P , NPO status , Patient's Chart, lab work & pertinent test results  History of Anesthesia Complications Negative for: history of anesthetic complications  Airway Mallampati: I  TM Distance: >3 FB Neck ROM: full    Dental no notable dental hx.    Pulmonary neg pulmonary ROS, former smoker,    Pulmonary exam normal breath sounds clear to auscultation       Cardiovascular negative cardio ROS Normal cardiovascular exam Rhythm:regular Rate:Normal     Neuro/Psych PSYCHIATRIC DISORDERS Depression Schizophrenia negative neurological ROS     GI/Hepatic Neg liver ROS, PUD,   Endo/Other  negative endocrine ROS  Renal/GU negative Renal ROS     Musculoskeletal   Abdominal   Peds  Hematology negative hematology ROS (+)   Anesthesia Other Findings   Reproductive/Obstetrics negative OB ROS                            Anesthesia Physical Anesthesia Plan  ASA: II  Anesthesia Plan: MAC   Post-op Pain Management:    Induction: Intravenous  Airway Management Planned: Nasal Cannula  Additional Equipment:   Intra-op Plan:   Post-operative Plan:   Informed Consent: I have reviewed the patients History and Physical, chart, labs and discussed the procedure including the risks, benefits and alternatives for the proposed anesthesia with the patient or authorized representative who has indicated his/her understanding and acceptance.   Dental Advisory Given  Plan Discussed with: Anesthesiologist, CRNA and Surgeon  Anesthesia Plan Comments:         Anesthesia Quick Evaluation

## 2015-08-20 NOTE — Discharge Instructions (Signed)

## 2015-08-20 NOTE — Transfer of Care (Signed)
Immediate Anesthesia Transfer of Care Note  Patient: [name]  Procedure(s) Performed: Procedure(s): ESOPHAGEAL ENDOSCOPIC ULTRASOUND (EUS) RADIAL (N/A)  Patient Location: PACU  Anesthesia Type:MAC  Level of Consciousness: awake, alert  and oriented  Airway & Oxygen Therapy: Patient Spontanous Breathing and Patient connected to nasal cannula oxygen  Post-op Assessment: Report given to RN and Post -op Vital signs reviewed and stable  Post vital signs: Reviewed and stable  Last Vitals:  Filed Vitals:   08/20/15 0928  BP: 151/79  Temp: 37.1 C  Resp: 18    Complications: No apparent anesthesia complications

## 2015-08-20 NOTE — Anesthesia Postprocedure Evaluation (Signed)
Anesthesia Post Note  Patient: [name]  Procedure(s) Performed: Procedure(s) (LRB): ESOPHAGEAL ENDOSCOPIC ULTRASOUND (EUS) RADIAL (N/A)  Patient location during evaluation: PACU Anesthesia Type: MAC Level of consciousness: awake and alert Pain management: pain level controlled Vital Signs Assessment: post-procedure vital signs reviewed and stable Respiratory status: spontaneous breathing, nonlabored ventilation, respiratory function stable and patient connected to nasal cannula oxygen Cardiovascular status: stable and blood pressure returned to baseline Anesthetic complications: no    Last Vitals:  Filed Vitals:   08/20/15 0928 08/20/15 1036  BP: 151/79 176/100  Pulse:  60  Temp: 37.1 C 37 C  Resp: 18 14    Last Pain:  Filed Vitals:   08/20/15 1037  PainSc: 6                  Zenaida Deed

## 2015-08-20 NOTE — H&P (View-Only) (Signed)
    Subjective: Abdominal pain improved. Nausea resolved. Tolerated breakfast this morning. Would like to advance diet.   Objective: Vital signs in last 24 hours: Temp:  [98.2 F (36.8 C)-98.7 F (37.1 C)] 98.2 F (36.8 C) (11/28 0641) Pulse Rate:  [79-83] 82 (11/28 0641) Resp:  [20] 20 (11/28 0641) BP: (100-115)/(53-60) 100/58 mmHg (11/28 0641) SpO2:  [97 %-99 %] 97 % (11/28 0641) Last BM Date: 07/31/15 General:   Alert and oriented, pleasant Head:  Normocephalic and atraumatic. Abdomen:  Bowel sounds present, soft, non-tender, non-distended. No HSM or hernias noted. No rebound or guarding. No masses appreciated  Msk:  Symmetrical without gross deformities. Normal posture. Extremities:  Without  edema. Neurologic:  Alert and  oriented x4;  grossly normal neurologically. Psych:  Alert and cooperative. Normal mood and affect.  Intake/Output from previous day: 11/27 0701 - 11/28 0700 In: 140 [P.O.:140] Out: -  Intake/Output this shift:    Lab Results:  Recent Labs  08/01/15 0805  WBC 14.1*  HGB 14.5  HCT 43.2  PLT 365   BMET  Recent Labs  08/01/15 0805  NA 140  K 3.5  CL 109  CO2 20*  GLUCOSE 157*  BUN 19  CREATININE 1.14*  CALCIUM 9.6   LFT  Recent Labs  08/01/15 0805  PROT 7.7  ALBUMIN 4.3  AST 28  ALT 25  ALKPHOS 81  BILITOT 1.2     Studies/Results: Dg Abd Acute W/chest  08/01/2015  CLINICAL DATA:  Nausea, vomiting and diarrhea since 07/28/2015. Initial encounter. EXAM: DG ABDOMEN ACUTE W/ 1V CHEST COMPARISON:  CT abdomen and pelvis 07/30/2015. FINDINGS: Single view of the chest demonstrates clear lungs and normal heart size. No pneumothorax or pleural effusion. Aortic atherosclerosis is noted. Two views of the abdomen show no free intraperitoneal air. Multiple surgical clips are seen in the left upper quadrant of the abdomen and there are sutures and clips in the right upper quadrant. The bowel gas pattern is nonobstructive. There is mild  convex left lumbar scoliosis. IMPRESSION: No acute finding chest or abdomen. Electronically Signed   By: Inge Rise M.D.   On: 08/01/2015 09:05    Assessment: 59 year old female with history of cirrhosis, gastric bypass  followed by our office, with prior diagnosis of intra-abdominal lesion likely representing a lymph node with last MRI in March 2016. Admitted with vomiting and abdominal pain. Clinically improved with resolution of symptoms now. Doubt any relation to previous history of gastric bypass. Area previously felt to be a lymph node now increased from 4.1 cm to 5.8 cm on CT this admission. Will reach out to Dr. Ardis Hughs regarding possibly biopsy via EUS, although this may not be accessible.  Last EGD Feb 2015.   Plan: Advance to soft diet Change Zofran to oral Protonix twice a day while inpatient and reduce to once daily as an outpatient Will discuss with Dr. Ardis Hughs potential options Hopeful discharge today if tolerates diet, with close outpatient follow-up  [name], ANP-BC [facility]       08/03/2015, 8:09 AM    Reviewed with Dr. Ardis Hughs. Should be possible to biopsy with EUS, FNA. With history of gastric bypass, may be difficult due to anatomy. Recommended referral to surgery due to enlargement. We will arrange outpatient EUS with Dr. Ardis Hughs.  [name], ANP-BC [facility]

## 2015-08-21 ENCOUNTER — Inpatient Hospital Stay (HOSPITAL_COMMUNITY)
Admission: EM | Admit: 2015-08-21 | Discharge: 2015-08-27 | DRG: 372 | Disposition: A | Discharge status: Home Health | Payer: Medicare Other | Attending: Pulmonary Disease | Admitting: Pulmonary Disease

## 2015-08-21 ENCOUNTER — Encounter (HOSPITAL_COMMUNITY): Payer: Self-pay | Admitting: Gastroenterology

## 2015-08-21 ENCOUNTER — Emergency Department (HOSPITAL_COMMUNITY): Payer: Medicare Other

## 2015-08-21 DIAGNOSIS — G8918 Other acute postprocedural pain: Secondary | ICD-10-CM

## 2015-08-21 DIAGNOSIS — K7460 Unspecified cirrhosis of liver: Secondary | ICD-10-CM | POA: Diagnosis present

## 2015-08-21 DIAGNOSIS — E876 Hypokalemia: Secondary | ICD-10-CM | POA: Diagnosis present

## 2015-08-21 DIAGNOSIS — K5900 Constipation, unspecified: Secondary | ICD-10-CM | POA: Diagnosis present

## 2015-08-21 DIAGNOSIS — R1013 Epigastric pain: Secondary | ICD-10-CM | POA: Diagnosis not present

## 2015-08-21 DIAGNOSIS — Z881 Allergy status to other antibiotic agents status: Secondary | ICD-10-CM

## 2015-08-21 DIAGNOSIS — I8500 Esophageal varices without bleeding: Secondary | ICD-10-CM | POA: Diagnosis present

## 2015-08-21 DIAGNOSIS — R109 Unspecified abdominal pain: Secondary | ICD-10-CM

## 2015-08-21 DIAGNOSIS — F329 Major depressive disorder, single episode, unspecified: Secondary | ICD-10-CM | POA: Diagnosis present

## 2015-08-21 DIAGNOSIS — Z5189 Encounter for other specified aftercare: Secondary | ICD-10-CM

## 2015-08-21 DIAGNOSIS — F259 Schizoaffective disorder, unspecified: Secondary | ICD-10-CM | POA: Diagnosis present

## 2015-08-21 DIAGNOSIS — R110 Nausea: Secondary | ICD-10-CM | POA: Diagnosis present

## 2015-08-21 DIAGNOSIS — K7581 Nonalcoholic steatohepatitis (NASH): Secondary | ICD-10-CM | POA: Diagnosis present

## 2015-08-21 DIAGNOSIS — E669 Obesity, unspecified: Secondary | ICD-10-CM | POA: Diagnosis present

## 2015-08-21 DIAGNOSIS — Z87891 Personal history of nicotine dependence: Secondary | ICD-10-CM

## 2015-08-21 DIAGNOSIS — Z66 Do not resuscitate: Secondary | ICD-10-CM | POA: Diagnosis present

## 2015-08-21 DIAGNOSIS — K659 Peritonitis, unspecified: Principal | ICD-10-CM | POA: Diagnosis present

## 2015-08-21 DIAGNOSIS — Z9884 Bariatric surgery status: Secondary | ICD-10-CM

## 2015-08-21 DIAGNOSIS — R1900 Intra-abdominal and pelvic swelling, mass and lump, unspecified site: Secondary | ICD-10-CM | POA: Diagnosis present

## 2015-08-21 DIAGNOSIS — K439 Ventral hernia without obstruction or gangrene: Secondary | ICD-10-CM | POA: Diagnosis present

## 2015-08-21 DIAGNOSIS — Z86010 Personal history of colonic polyps: Secondary | ICD-10-CM

## 2015-08-21 DIAGNOSIS — Z6832 Body mass index (BMI) 32.0-32.9, adult: Secondary | ICD-10-CM

## 2015-08-21 MED ORDER — ONDANSETRON HCL 4 MG/2ML IJ SOLN
4.0000 mg | Freq: Once | INTRAMUSCULAR | Status: AC
  Administered 2015-08-21: 4 mg via INTRAVENOUS
  Filled 2015-08-21: qty 2

## 2015-08-21 MED ORDER — SODIUM CHLORIDE 0.9 % IV BOLUS (SEPSIS)
1000.0000 mL | Freq: Once | INTRAVENOUS | Status: AC
  Administered 2015-08-21: 1000 mL via INTRAVENOUS

## 2015-08-21 MED ORDER — MORPHINE SULFATE (PF) 2 MG/ML IV SOLN
4.0000 mg | Freq: Once | INTRAVENOUS | Status: AC
  Administered 2015-08-21: 4 mg via INTRAVENOUS
  Filled 2015-08-21: qty 2

## 2015-08-21 NOTE — ED Notes (Signed)
Pt c/o abdominal pain and nausea since tonight.

## 2015-08-21 NOTE — ED Provider Notes (Signed)
CSN: [id number]     Arrival date & time 08/21/15  2247 History   First MD Initiated Contact with Patient 08/21/15 2259     Chief Complaint  Patient presents with  . Abdominal Pain     (Consider location/radiation/quality/duration/timing/severity/associated sxs/prior Treatment) HPI  This is a 59 year old female with a history of diverticulosis, peptic ulcer disease, cirrhosis, and noted recently to have an enlarging hepatic mass who presents with abdominal pain and nausea. Patient reports 4 hours of worsening abdominal pain and nausea. Abdominal pain is left-sided dull, nonradiating, and currently 10 out of 10. She has not taken anything for her symptoms. She had similar symptoms prior to her last admission and states that "I think they're related to my mass." She did have a EGD with a biopsy yesterday at [facility]. She denies any vomiting or diarrhea. Denies any constipation. Denies any chest pain or shortness of breath.  Past Medical History  Diagnosis Date  . Depression   . Chronic constipation   . Diverticulosis 01/31/2008    colonoscopy Dr Gala Romney  . Obesity   . Schizoaffective disorder     Mental Health-Dr Elias Else  . PUD (peptic ulcer disease) 1988  . Colonic inertia   . Hyperplastic colon polyp   . Diverticula of colon   . Cirrhosis ([place])     child pugh class A and MELD 5, completed hep A and B vaccine series, no HCC on 3/16 MRI   Past Surgical History  Procedure Laterality Date  . Tonsillectomy    . S/p hysterectomy      partial  . Gastric bypass      with Revision, Dr. Duffy Rhody  . Wrist surgery    . Foot surgery      left  . Cholecystectomy  08/2010    cholelithiasis; Dr Arnoldo Morale  . Colonoscopy  01/31/2008    Dr. Gala Romney- normal rectum, L sided diverticula, long tortuous colon. hyperplasitic  polyp.  . Ercp w/ sphincterotomy and balloon dilation  08/05/2010    normal ampulla/mild erythema in the gastric remnant without ulcerations or erosions. normal  retroflexed view of the cardia/anastomosis normal/distal common bile duct stones s/p extraction and shincterotomy  . Esophagogastroduodenoscopy N/A 10/24/2013    [id number] esophagus. Surgically altered stomach. Abnormal gastric mucosa  -  status post biopsy (reactive gastrophathy)  . Eus N/A 08/20/2015    Procedure: ESOPHAGEAL ENDOSCOPIC ULTRASOUND (EUS) RADIAL;  Surgeon: Milus Banister, MD;  Location: [facility];  Service: Endoscopy;  Laterality: N/A;   Family History  Problem Relation Age of Onset  . Colon cancer Maternal Uncle 63  . Diabetes Father   . Liver disease Neg Hx    Social History  Substance Use Topics  . Smoking status: Former Smoker -- 1.00 packs/day for 30 years    Types: Cigarettes    Quit date: 07/06/2015  . Smokeless tobacco: Former Systems developer     Comment: Quit smoking x 10 years  . Alcohol Use: No   OB History    No data available     Review of Systems  Constitutional: Negative for fever.  Respiratory: Negative for chest tightness and shortness of breath.   Cardiovascular: Negative for chest pain.  Gastrointestinal: Positive for nausea and abdominal pain. Negative for vomiting, diarrhea and constipation.  Genitourinary: Negative for dysuria.  Neurological: Negative for headaches.  All other systems reviewed and are negative.     Allergies  Ciprofloxacin  Home Medications   Prior to Admission medications   Medication Sig  Start Date End Date Taking? Authorizing Provider  buPROPion (WELLBUTRIN SR) 150 MG 12 hr tablet Take 150 mg by mouth daily. 07/20/15   Historical Provider, MD  calcium-vitamin D (OSCAL WITH D) 500-200 MG-UNIT tablet Take 1 tablet by mouth 2 (two) times daily.    Historical Provider, MD  FLUoxetine (PROZAC) 40 MG capsule Take 40 mg by mouth daily.  06/06/12   Historical Provider, MD  LINZESS 290 MCG CAPS capsule TAKE ONE CAPSULE BY MOUTH DAILY 30 MINUTES BEFORE BREAKFAST. 07/20/15   Carlis Stable, NP  pantoprazole (PROTONIX) 40 MG tablet  Take 1 tablet (40 mg total) by mouth daily. 08/04/15   Sinda Du, MD  risperiDONE (RISPERDAL) 2 MG tablet Take 2 mg by mouth at bedtime.    Historical Provider, MD  traZODone (DESYREL) 150 MG tablet Take 300 mg by mouth at bedtime. 07/20/15   Historical Provider, MD   BP 130/66 mmHg  Pulse 83  Temp(Src) 97.7 F (36.5 C) (Oral)  Resp 18  Ht 5\' 4"  (1.626 m)  Wt 190 lb (86.183 kg)  BMI 32.60 kg/m2  SpO2 98% Physical Exam  Constitutional: She is oriented to person, place, and time. She appears well-developed and well-nourished. No distress.  HENT:  Head: Normocephalic and atraumatic.  Cardiovascular: Normal rate, regular rhythm and normal heart sounds.   Pulmonary/Chest: Effort normal. No respiratory distress. She has no wheezes. She has rales.  Abdominal: Soft. Bowel sounds are normal. She exhibits no distension. There is no rebound and no guarding.  Minimal tenderness to palpation of the epigastrium and left upper quadrant, no signs of peritonitis  Neurological: She is alert and oriented to person, place, and time.  Skin: Skin is warm and dry.  Psychiatric: She has a normal mood and affect.  Nursing note and vitals reviewed.   ED Course  Procedures (including critical care time) Labs Review Labs Reviewed  CBC WITH DIFFERENTIAL/PLATELET - Abnormal; Notable for the following:    WBC 24.0 (*)    Neutro Abs 21.5 (*)    Monocytes Absolute 1.7 (*)    All other components within normal limits  COMPREHENSIVE METABOLIC PANEL - Abnormal; Notable for the following:    Potassium 3.4 (*)    Glucose, Bld 217 (*)    Creatinine, Ser 1.08 (*)    GFR calc non Af Amer 55 (*)    All other components within normal limits  LIPASE, BLOOD  LACTIC ACID, PLASMA  LACTIC ACID, PLASMA    Imaging Review Ct Abdomen Pelvis W Contrast  08/22/2015  CLINICAL DATA:  Sharp umbilical abdominal pain and nausea, onset tonight. Abnormal x-ray. EXAM: CT ABDOMEN AND PELVIS WITH CONTRAST TECHNIQUE:  Multidetector CT imaging of the abdomen and pelvis was performed using the standard protocol following bolus administration of intravenous contrast. CONTRAST:  50mL OMNIPAQUE IOHEXOL 300 MG/ML SOLN, 125mL OMNIPAQUE IOHEXOL 300 MG/ML SOLN COMPARISON:  Abdominal radiographs 1 day prior.  CT 07/30/2015 FINDINGS: Lower chest:  The included lung bases are clear. Liver: No focal hepatic lesion. Low-attenuation mass at the porta hepatis measures 6.1 x 4.6 cm, previously 5.8 x 4.9 cm, some internal and very lesional air. Air tracks along the portal vein and in the right retroperitoneum. Hepatobiliary: Postcholecystectomy with clips in the gallbladder fossa. Pancreas: No ductal dilatation. Small amount of fluid adjacent to the pancreatic head and in the retroperitoneum. Spleen: Normal in size and density. Adrenal glands: No nodule. Kidneys: Symmetric renal enhancement and excretion. No hydronephrosis. No perinephric stranding. Stomach/Bowel: Patient is post  gastric bypass. High-density material in the excluded gastric lumen is unchanged. There is periduodenal soft tissue stranding and small amount of free fluid. There are no dilated or thickened small bowel loops. Small volume of stool throughout the colon without colonic wall thickening. Right upper ventral abdominal wall hernia contains normal appearing loop of colon. Submucosal fatty infiltration of the ascending colon, unchanged. The appendix is normal. Vascular/Lymphatic: No retroperitoneal adenopathy. Abdominal aorta is normal in caliber. Moderate atherosclerosis without aneurysm. Reproductive: Uterus surgically absent.  No adnexal mass. Bladder: Physiologically distended. Other: Retroperitoneal air in the right upper quadrant of the abdomen. Air appears adjacent to the mass in the porta hepatis. Musculoskeletal: There are no acute or suspicious osseous abnormalities. IMPRESSION: 1. Retroperitoneal air in the right upper quadrant, likely sequela of recent endoscopic  biopsy of porta hepatis mass. Air appears centered about the mass, tracking about the porta hepatis and right retroperitoneum. Small amount of adjacent retroperitoneal free fluid. No confluent fluid collection or abscess. 2. Right lateral ventral abdominal wall hernia containing normal appearing loops of colon, no obstruction or inflammation. These results were called by telephone at the time of interpretation on 08/22/2015 at 2:09 am to Dr. Thayer Jew , who verbally acknowledged these results. Electronically Signed   By: Jeb Levering M.D.   On: 08/22/2015 02:12   Dg Abd Acute W/chest  08/22/2015  CLINICAL DATA:  Upper abdominal pain, nausea, and vomiting for 4 hours. Productive cough. Recent procedure. EXAM: DG ABDOMEN ACUTE W/ 1V CHEST COMPARISON:  08/01/2015 FINDINGS: Normal heart size and pulmonary vascularity. No focal airspace disease or consolidation in the lungs. No blunting of costophrenic angles. No pneumothorax. Mediastinal contours appear intact. Scattered gas and stool in the colon. No small or large bowel distention. Linear gas collections along the psoas margins likely representing retroperitoneal air. No free intra-abdominal air. No abnormal air-fluid levels. No radiopaque stones. Surgical clips in the upper abdomen. Degenerative changes in the lumbar spine. Calcified phleboliths in the pelvis. IMPRESSION: No evidence of active pulmonary disease. Suggestion of retroperitoneal gas in the abdomen. Duodenum perforation could have this appearance. Suggest CT for further evaluation. Nonobstructive bowel gas pattern. These results were called by telephone at the time of interpretation on 08/22/2015 at 12:22 am to Dr. Thayer Jew , who verbally acknowledged these results. Electronically Signed   By: Lucienne Capers M.D.   On: 08/22/2015 00:25   I have personally reviewed and evaluated these images and lab results as part of my medical decision-making.   EKG Interpretation None       MDM   Final diagnoses:  Pain following surgery or procedure    Patient presents with abdominal pain. Pain similar to when she was admitted in November and found to have an abdominal mass. She did have a ultrasound guided biopsy yesterday. No signs of peritonitis on exam and vital signs are reassuring. Initial lab work notable for leukocytosis to 25. Acute abdominal series with questionable air. Patient was given fluids, pain, and nausea medication. Given x-ray, CT scan was obtained and patient was given IV Zosyn to cover for infection given acute leukocytosis. CT scan shows small amount of air around the mass and retroperitoneal which is likely appropriate given recent biopsy. Discussed with Dr. Carlean Purl, on call for [facility].  Will admit to [facility] for symptom control and serial exams. If patient worsens, patient may need transfer to [facility].  Discussed with Dr. Darrick Meigs.      Merryl Hacker, MD 08/22/15  0304 

## 2015-08-22 ENCOUNTER — Emergency Department (HOSPITAL_COMMUNITY): Payer: Medicare Other

## 2015-08-22 ENCOUNTER — Encounter (HOSPITAL_COMMUNITY): Payer: Self-pay | Admitting: *Deleted

## 2015-08-22 DIAGNOSIS — R1033 Periumbilical pain: Secondary | ICD-10-CM | POA: Diagnosis not present

## 2015-08-22 DIAGNOSIS — G8918 Other acute postprocedural pain: Secondary | ICD-10-CM | POA: Diagnosis present

## 2015-08-22 DIAGNOSIS — R110 Nausea: Secondary | ICD-10-CM | POA: Diagnosis not present

## 2015-08-22 DIAGNOSIS — R1010 Upper abdominal pain, unspecified: Secondary | ICD-10-CM | POA: Diagnosis not present

## 2015-08-22 DIAGNOSIS — R109 Unspecified abdominal pain: Secondary | ICD-10-CM | POA: Diagnosis not present

## 2015-08-22 DIAGNOSIS — R112 Nausea with vomiting, unspecified: Secondary | ICD-10-CM | POA: Diagnosis not present

## 2015-08-22 DIAGNOSIS — R1900 Intra-abdominal and pelvic swelling, mass and lump, unspecified site: Secondary | ICD-10-CM | POA: Diagnosis not present

## 2015-08-22 LAB — COMPREHENSIVE METABOLIC PANEL
ALT: 14 U/L (ref 14–54)
ALT: 13 U/L — ABNORMAL LOW (ref 14–54)
AST: 17 U/L (ref 15–41)
AST: 16 U/L (ref 15–41)
Albumin: 4 g/dL (ref 3.5–5.0)
Albumin: 3.5 g/dL (ref 3.5–5.0)
Alkaline Phosphatase: 84 U/L (ref 38–126)
Alkaline Phosphatase: 81 U/L (ref 38–126)
Anion gap: 14 (ref 5–15)
Anion gap: 9 (ref 5–15)
BUN: 12 mg/dL (ref 6–20)
BUN: 11 mg/dL (ref 6–20)
CO2: 22 mmol/L (ref 22–32)
CO2: 22 mmol/L (ref 22–32)
Calcium: 9.3 mg/dL (ref 8.9–10.3)
Calcium: 8.9 mg/dL (ref 8.9–10.3)
Chloride: 101 mmol/L (ref 101–111)
Chloride: 106 mmol/L (ref 101–111)
Creatinine, Ser: 1.08 mg/dL — ABNORMAL HIGH (ref 0.44–1.00)
Creatinine, Ser: 0.89 mg/dL (ref 0.44–1.00)
GFR calc Af Amer: >60 mL/min (ref >60)
GFR calc Af Amer: >60 mL/min (ref >60)
GFR calc non Af Amer: 55 mL/min — ABNORMAL LOW (ref >60)
GFR calc non Af Amer: >60 mL/min (ref >60)
Glucose, Bld: 217 mg/dL — ABNORMAL HIGH (ref 65–99)
Glucose, Bld: 171 mg/dL — ABNORMAL HIGH (ref 65–99)
Potassium: 3.4 mmol/L — ABNORMAL LOW (ref 3.5–5.1)
Potassium: 3.4 mmol/L — ABNORMAL LOW (ref 3.5–5.1)
Sodium: 137 mmol/L (ref 135–145)
Sodium: 137 mmol/L (ref 135–145)
Total Bilirubin: 0.8 mg/dL (ref 0.3–1.2)
Total Bilirubin: 0.7 mg/dL (ref 0.3–1.2)
Total Protein: 7.3 g/dL (ref 6.5–8.1)
Total Protein: 6.8 g/dL (ref 6.5–8.1)

## 2015-08-22 LAB — CBC WITH DIFFERENTIAL/PLATELET
Basophils Absolute: 0 10*3/uL (ref 0.0–0.1)
Basophils Relative: 0 %
Eosinophils Absolute: 0 10*3/uL (ref 0.0–0.7)
Eosinophils Relative: 0 %
HCT: 41.7 % (ref 36.0–46.0)
Hemoglobin: 14.1 g/dL (ref 12.0–15.0)
Lymphocytes Relative: 3 %
Lymphs Abs: 0.8 10*3/uL (ref 0.7–4.0)
MCH: 30.7 pg (ref 26.0–34.0)
MCHC: 33.8 g/dL (ref 30.0–36.0)
MCV: 90.8 fL (ref 78.0–100.0)
Monocytes Absolute: 1.7 10*3/uL — ABNORMAL HIGH (ref 0.1–1.0)
Monocytes Relative: 7 %
Neutro Abs: 21.5 10*3/uL — ABNORMAL HIGH (ref 1.7–7.7)
Neutrophils Relative %: 90 %
Platelets: 349 10*3/uL (ref 150–400)
RBC: 4.59 MIL/uL (ref 3.87–5.11)
RDW: 12.8 % (ref 11.5–15.5)
WBC: 24 10*3/uL — ABNORMAL HIGH (ref 4.0–10.5)

## 2015-08-22 LAB — CBC
HCT: 42.1 % (ref 36.0–46.0)
Hemoglobin: 13.9 g/dL (ref 12.0–15.0)
MCH: 30 pg (ref 26.0–34.0)
MCHC: 33 g/dL (ref 30.0–36.0)
MCV: 90.9 fL (ref 78.0–100.0)
Platelets: 374 10*3/uL (ref 150–400)
RBC: 4.63 MIL/uL (ref 3.87–5.11)
RDW: 12.8 % (ref 11.5–15.5)
WBC: 31.1 10*3/uL — ABNORMAL HIGH (ref 4.0–10.5)

## 2015-08-22 LAB — LACTIC ACID, PLASMA
Lactic Acid, Venous: 1.7 mmol/L (ref 0.5–2.0)
Lactic Acid, Venous: 2.9 mmol/L (ref 0.5–2.0)

## 2015-08-22 LAB — LIPASE, BLOOD: Lipase: 39 U/L (ref 11–51)

## 2015-08-22 MED ORDER — ONDANSETRON HCL 4 MG/2ML IJ SOLN
4.0000 mg | Freq: Four times a day (QID) | INTRAMUSCULAR | Status: DC | PRN
  Administered 2015-08-22 – 2015-08-27 (×8): 4 mg via INTRAVENOUS
  Filled 2015-08-22 (×11): qty 2

## 2015-08-22 MED ORDER — RISPERIDONE 1 MG PO TABS
2.0000 mg | ORAL_TABLET | Freq: Every day | ORAL | Status: DC
  Administered 2015-08-22 – 2015-08-26 (×5): 2 mg via ORAL
  Filled 2015-08-22 (×5): qty 2

## 2015-08-22 MED ORDER — ENOXAPARIN SODIUM 40 MG/0.4ML ~~LOC~~ SOLN
40.0000 mg | SUBCUTANEOUS | Status: DC
  Administered 2015-08-22 – 2015-08-26 (×5): 40 mg via SUBCUTANEOUS
  Filled 2015-08-22 (×6): qty 0.4

## 2015-08-22 MED ORDER — PIPERACILLIN-TAZOBACTAM 3.375 G IVPB
3.3750 g | Freq: Three times a day (TID) | INTRAVENOUS | Status: DC
  Administered 2015-08-22 – 2015-08-26 (×11): 3.375 g via INTRAVENOUS
  Filled 2015-08-22 (×10): qty 50

## 2015-08-22 MED ORDER — TRAZODONE HCL 50 MG PO TABS
300.0000 mg | ORAL_TABLET | Freq: Every day | ORAL | Status: DC
  Administered 2015-08-22 – 2015-08-26 (×5): 300 mg via ORAL
  Filled 2015-08-22 (×5): qty 6

## 2015-08-22 MED ORDER — HYDROMORPHONE HCL 1 MG/ML IJ SOLN
1.0000 mg | Freq: Once | INTRAMUSCULAR | Status: AC
  Administered 2015-08-22: 1 mg via INTRAVENOUS
  Filled 2015-08-22: qty 1

## 2015-08-22 MED ORDER — BUPROPION HCL ER (SR) 150 MG PO TB12
150.0000 mg | ORAL_TABLET | Freq: Every day | ORAL | Status: DC
  Administered 2015-08-22 – 2015-08-27 (×6): 150 mg via ORAL
  Filled 2015-08-22 (×7): qty 1

## 2015-08-22 MED ORDER — PROMETHAZINE HCL 25 MG/ML IJ SOLN
25.0000 mg | Freq: Once | INTRAMUSCULAR | Status: AC
  Administered 2015-08-22: 25 mg via INTRAVENOUS
  Filled 2015-08-22: qty 1

## 2015-08-22 MED ORDER — ONDANSETRON HCL 4 MG PO TABS
4.0000 mg | ORAL_TABLET | Freq: Four times a day (QID) | ORAL | Status: DC | PRN
  Administered 2015-08-24: 4 mg via ORAL
  Filled 2015-08-22: qty 1

## 2015-08-22 MED ORDER — HYDROMORPHONE HCL 1 MG/ML IJ SOLN
1.0000 mg | INTRAMUSCULAR | Status: DC | PRN
  Administered 2015-08-22 – 2015-08-27 (×22): 1 mg via INTRAVENOUS
  Filled 2015-08-22 (×22): qty 1

## 2015-08-22 MED ORDER — POTASSIUM CHLORIDE IN NACL 20-0.9 MEQ/L-% IV SOLN
INTRAVENOUS | Status: DC
  Administered 2015-08-22 – 2015-08-24 (×5): via INTRAVENOUS
  Administered 2015-08-25 – 2015-08-26 (×2): 1000 mL via INTRAVENOUS
  Administered 2015-08-27: 03:00:00 via INTRAVENOUS

## 2015-08-22 MED ORDER — FLUOXETINE HCL 20 MG PO CAPS
40.0000 mg | ORAL_CAPSULE | Freq: Every day | ORAL | Status: DC
  Administered 2015-08-22 – 2015-08-27 (×6): 40 mg via ORAL
  Filled 2015-08-22 (×6): qty 2

## 2015-08-22 MED ORDER — PANTOPRAZOLE SODIUM 40 MG PO TBEC
40.0000 mg | DELAYED_RELEASE_TABLET | Freq: Every day | ORAL | Status: DC
  Administered 2015-08-22 – 2015-08-24 (×3): 40 mg via ORAL
  Filled 2015-08-22 (×3): qty 1

## 2015-08-22 MED ORDER — PIPERACILLIN-TAZOBACTAM 3.375 G IVPB 30 MIN
3.3750 g | Freq: Once | INTRAVENOUS | Status: AC
  Administered 2015-08-22: 3.375 g via INTRAVENOUS
  Filled 2015-08-22: qty 50

## 2015-08-22 MED ORDER — ONDANSETRON HCL 4 MG/2ML IJ SOLN
4.0000 mg | Freq: Once | INTRAMUSCULAR | Status: AC
  Administered 2015-08-22: 4 mg via INTRAVENOUS
  Filled 2015-08-22: qty 2

## 2015-08-22 MED ORDER — IOHEXOL 300 MG/ML  SOLN
100.0000 mL | Freq: Once | INTRAMUSCULAR | Status: AC | PRN
  Administered 2015-08-22: 100 mL via INTRAVENOUS

## 2015-08-22 MED ORDER — IOHEXOL 300 MG/ML  SOLN
50.0000 mL | Freq: Once | INTRAMUSCULAR | Status: AC | PRN
  Administered 2015-08-22: 50 mL via ORAL

## 2015-08-22 NOTE — Progress Notes (Signed)
Subjective: This is an assumption of care note. She was brought into the hospital with abdominal pain and air in the peritoneum probably related to a biopsy of a mass lesion. She still has nausea and is still complaining of pain. Her white blood count and lactate level have been up but she does not appear septic.  Objective: Vital signs in last 24 hours: Temp:  [97.7 F (36.5 C)-98.9 F (37.2 C)] 98.8 F (37.1 C) (12/17 0621) Pulse Rate:  [60-103] 79 (12/17 0621) Resp:  [18-20] 18 (12/17 0621) BP: (114-153)/(62-85) 141/62 mmHg (12/17 0621) SpO2:  [95 %-99 %] 95 % (12/17 0621) Weight:  [84.5 kg (186 lb 4.6 oz)-86.183 kg (190 lb)] 84.5 kg (186 lb 4.6 oz) (12/17 0356) Weight change:  Last BM Date: 08/22/15  Intake/Output from previous day:    PHYSICAL EXAM General appearance: alert, cooperative and mild distress Resp: clear to auscultation bilaterally Cardio: regular rate and rhythm, S1, S2 normal, no murmur, click, rub or gallop GI: Diffuse abdominal tenderness with no rebound Extremities: extremities normal, atraumatic, no cyanosis or edema  Lab Results:  Results for orders placed or performed during the hospital encounter of 08/21/15 (from the past 48 hour(s))  CBC with Differential     Status: Abnormal   Collection Time: 08/22/15 12:23 AM  Result Value Ref Range   WBC 24.0 (H) 4.0 - 10.5 K/uL   RBC 4.59 3.87 - 5.11 MIL/uL   Hemoglobin 14.1 12.0 - 15.0 g/dL   HCT 14.2 95.7 - 08.1 %   MCV 90.8 78.0 - 100.0 fL   MCH 30.7 26.0 - 34.0 pg   MCHC 33.8 30.0 - 36.0 g/dL   RDW 64.6 72.5 - 91.2 %   Platelets 349 150 - 400 K/uL   Neutrophils Relative % 90 %   Neutro Abs 21.5 (H) 1.7 - 7.7 K/uL   Lymphocytes Relative 3 %   Lymphs Abs 0.8 0.7 - 4.0 K/uL   Monocytes Relative 7 %   Monocytes Absolute 1.7 (H) 0.1 - 1.0 K/uL   Eosinophils Relative 0 %   Eosinophils Absolute 0.0 0.0 - 0.7 K/uL   Basophils Relative 0 %   Basophils Absolute 0.0 0.0 - 0.1 K/uL  Comprehensive metabolic  panel     Status: Abnormal   Collection Time: 08/22/15 12:23 AM  Result Value Ref Range   Sodium 137 135 - 145 mmol/L   Potassium 3.4 (L) 3.5 - 5.1 mmol/L   Chloride 101 101 - 111 mmol/L   CO2 22 22 - 32 mmol/L   Glucose, Bld 217 (H) 65 - 99 mg/dL   BUN 12 6 - 20 mg/dL   Creatinine, Ser 6.44 (H) 0.44 - 1.00 mg/dL   Calcium 9.3 8.9 - 55.2 mg/dL   Total Protein 7.3 6.5 - 8.1 g/dL   Albumin 4.0 3.5 - 5.0 g/dL   AST 17 15 - 41 U/L   ALT 14 14 - 54 U/L   Alkaline Phosphatase 84 38 - 126 U/L   Total Bilirubin 0.8 0.3 - 1.2 mg/dL   GFR calc non Af Amer 55 (L) >60 mL/min   GFR calc Af Amer >60 >60 mL/min    Comment: (NOTE) The eGFR has been calculated using the CKD EPI equation. This calculation has not been validated in all clinical situations. eGFR's persistently <60 mL/min signify possible Chronic Kidney Disease.    Anion gap 14 5 - 15  Lipase, blood     Status: None   Collection Time: 08/22/15 12:23  AM  Result Value Ref Range   Lipase 39 11 - 51 U/L  Lactic acid, plasma     Status: None   Collection Time: 08/22/15 12:23 AM  Result Value Ref Range   Lactic Acid, Venous 1.7 0.5 - 2.0 mmol/L  Lactic acid, plasma     Status: Abnormal   Collection Time: 08/22/15  2:31 AM  Result Value Ref Range   Lactic Acid, Venous 2.9 (HH) 0.5 - 2.0 mmol/L    Comment: CRITICAL RESULT CALLED TO, READ BACK BY AND VERIFIED WITH: THOMAS,K AT 0405 BY HUFFINES,S ON 08/22/15.   CBC     Status: Abnormal   Collection Time: 08/22/15  6:08 AM  Result Value Ref Range   WBC 31.1 (H) 4.0 - 10.5 K/uL   RBC 4.63 3.87 - 5.11 MIL/uL   Hemoglobin 13.9 12.0 - 15.0 g/dL   HCT 65.5 14.8 - 61.8 %   MCV 90.9 78.0 - 100.0 fL   MCH 30.0 26.0 - 34.0 pg   MCHC 33.0 30.0 - 36.0 g/dL   RDW 05.4 57.0 - 85.5 %   Platelets 374 150 - 400 K/uL  Comprehensive metabolic panel     Status: Abnormal   Collection Time: 08/22/15  6:08 AM  Result Value Ref Range   Sodium 137 135 - 145 mmol/L   Potassium 3.4 (L) 3.5 - 5.1  mmol/L   Chloride 106 101 - 111 mmol/L   CO2 22 22 - 32 mmol/L   Glucose, Bld 171 (H) 65 - 99 mg/dL   BUN 11 6 - 20 mg/dL   Creatinine, Ser 1.10 0.44 - 1.00 mg/dL   Calcium 8.9 8.9 - 55.8 mg/dL   Total Protein 6.8 6.5 - 8.1 g/dL   Albumin 3.5 3.5 - 5.0 g/dL   AST 16 15 - 41 U/L   ALT 13 (L) 14 - 54 U/L   Alkaline Phosphatase 81 38 - 126 U/L   Total Bilirubin 0.7 0.3 - 1.2 mg/dL   GFR calc non Af Amer >60 >60 mL/min   GFR calc Af Amer >60 >60 mL/min    Comment: (NOTE) The eGFR has been calculated using the CKD EPI equation. This calculation has not been validated in all clinical situations. eGFR's persistently <60 mL/min signify possible Chronic Kidney Disease.    Anion gap 9 5 - 15    ABGS No results for input(s): PHART, PO2ART, TCO2, HCO3 in the last 72 hours.  Invalid input(s): PCO2 CULTURES No results found for this or any previous visit (from the past 240 hour(s)). Studies/Results: Ct Abdomen Pelvis W Contrast  08/22/2015  CLINICAL DATA:  Sharp umbilical abdominal pain and nausea, onset tonight. Abnormal x-ray. EXAM: CT ABDOMEN AND PELVIS WITH CONTRAST TECHNIQUE: Multidetector CT imaging of the abdomen and pelvis was performed using the standard protocol following bolus administration of intravenous contrast. CONTRAST:  43mL OMNIPAQUE IOHEXOL 300 MG/ML SOLN, OMNIPAQUE IOHEXOL 300 MG/ML SOLN COMPARISON:  Abdominal radiographs 1 day prior.  CT 07/30/2015 FINDINGS: Lower chest:  The included lung bases are clear. Liver: No focal hepatic lesion. Low-attenuation mass at the porta hepatis measures 6.1 x 4.6 cm, previously 5.8 x 4.9 cm, some internal and very lesional air. Air tracks along the portal vein and in the right retroperitoneum. Hepatobiliary: Postcholecystectomy with clips in the gallbladder fossa. Pancreas: No ductal dilatation. Small amount of fluid adjacent to the pancreatic head and in the retroperitoneum. Spleen: Normal in size and density. Adrenal glands: No  nodule. Kidneys: Symmetric renal enhancement  and excretion. No hydronephrosis. No perinephric stranding. Stomach/Bowel: Patient is post gastric bypass. High-density material in the excluded gastric lumen is unchanged. There is periduodenal soft tissue stranding and small amount of free fluid. There are no dilated or thickened small bowel loops. Small volume of stool throughout the colon without colonic wall thickening. Right upper ventral abdominal wall hernia contains normal appearing loop of colon. Submucosal fatty infiltration of the ascending colon, unchanged. The appendix is normal. Vascular/Lymphatic: No retroperitoneal adenopathy. Abdominal aorta is normal in caliber. Moderate atherosclerosis without aneurysm. Reproductive: Uterus surgically absent.  No adnexal mass. Bladder: Physiologically distended. Other: Retroperitoneal air in the right upper quadrant of the abdomen. Air appears adjacent to the mass in the porta hepatis. Musculoskeletal: There are no acute or suspicious osseous abnormalities. IMPRESSION: 1. Retroperitoneal air in the right upper quadrant, likely sequela of recent endoscopic biopsy of porta hepatis mass. Air appears centered about the mass, tracking about the porta hepatis and right retroperitoneum. Small amount of adjacent retroperitoneal free fluid. No confluent fluid collection or abscess. 2. Right lateral ventral abdominal wall hernia containing normal appearing loops of colon, no obstruction or inflammation. These results were called by telephone at the time of interpretation on 08/22/2015 at 2:09 am to Dr. Ross Marcus , who verbally acknowledged these results. Electronically Signed   By: Rubye Oaks M.D.   On: 08/22/2015 02:12   Dg Abd Acute W/chest  08/22/2015  CLINICAL DATA:  Upper abdominal pain, nausea, and vomiting for 4 hours. Productive cough. Recent procedure. EXAM: DG ABDOMEN ACUTE W/ 1V CHEST COMPARISON:  08/01/2015 FINDINGS: Normal heart size and pulmonary  vascularity. No focal airspace disease or consolidation in the lungs. No blunting of costophrenic angles. No pneumothorax. Mediastinal contours appear intact. Scattered gas and stool in the colon. No small or large bowel distention. Linear gas collections along the psoas margins likely representing retroperitoneal air. No free intra-abdominal air. No abnormal air-fluid levels. No radiopaque stones. Surgical clips in the upper abdomen. Degenerative changes in the lumbar spine. Calcified phleboliths in the pelvis. IMPRESSION: No evidence of active pulmonary disease. Suggestion of retroperitoneal gas in the abdomen. Duodenum perforation could have this appearance. Suggest CT for further evaluation. Nonobstructive bowel gas pattern. These results were called by telephone at the time of interpretation on 08/22/2015 at 12:22 am to Dr. Ross Marcus , who verbally acknowledged these results. Electronically Signed   By: Burman Nieves M.D.   On: 08/22/2015 00:25    Medications:  Prior to Admission:  Prescriptions prior to admission  Medication Sig Dispense Refill Last Dose  . buPROPion (WELLBUTRIN SR) 150 MG 12 hr tablet Take 300 mg by mouth daily.    08/21/2015 at Unknown time  . calcium-vitamin D (OSCAL WITH D) 500-200 MG-UNIT tablet Take 1 tablet by mouth 2 (two) times daily.   08/21/2015 at Unknown time  . FLUoxetine (PROZAC) 40 MG capsule Take 40 mg by mouth daily.    08/21/2015 at Unknown time  . LINZESS 290 MCG CAPS capsule TAKE ONE CAPSULE BY MOUTH DAILY 30 MINUTES BEFORE BREAKFAST. 30 capsule 11 08/21/2015 at Unknown time  . pantoprazole (PROTONIX) 40 MG tablet Take 1 tablet (40 mg total) by mouth daily. 30 tablet 12 08/21/2015 at Unknown time  . risperiDONE (RISPERDAL) 2 MG tablet Take 2 mg by mouth at bedtime.   08/21/2015 at Unknown time  . traZODone (DESYREL) 150 MG tablet Take 300 mg by mouth at bedtime.   08/21/2015 at Unknown time   Scheduled: . buPROPion  150 mg Oral Daily  . enoxaparin  (LOVENOX) injection  40 mg Subcutaneous Q24H  . FLUoxetine  40 mg Oral Daily  . pantoprazole  40 mg Oral Daily  . risperiDONE  2 mg Oral QHS  . traZODone  300 mg Oral QHS   Continuous: . 0.9 % NaCl with KCl 20 mEq / L 75 mL/hr at 08/22/15 0413   ZES:[id number] (DILAUDID) injection, ondansetron **OR** ondansetron (ZOFRAN) IV  Assesment: She has nausea and abdominal pain. This is likely related to her procedure to biopsy an abdominal mass. Her white blood count is up and her lactate level is up but clinically she does not appear to be septic. She is receiving IV fluids at 75 mL per hour and is nothing by mouth except for medications right now. She is on antibiotics which I think is appropriate in total we can sort out her clinical situation a little better Active Problems:   Abdominal pain   Nausea   Abdominal mass   Pain following surgery or procedure    Plan: Into new current treatments      Jemell Town L 08/22/2015, 10:35 AM

## 2015-08-22 NOTE — H&P (Signed)
PCP:   Alonza Bogus, MD   Chief Complaint:  Abdominal pain  HPI: 59 year old female who   has a past medical history of Depression; Chronic constipation; Diverticulosis (01/31/2008); Obesity; Schizoaffective disorder; PUD (peptic ulcer disease) (1988); Colonic inertia; Hyperplastic colon polyp; Diverticula of colon; and Cirrhosis ([place]). Patient was recently noted to have enlarging hepatic mass, for which she underwent endoscopic ultrasound biopsy yesterday at [facility]. Today patient started having abdominal pain along with nausea. She rated pain as 10 out of 10, generalized. In the ED CT scan of the abdomen was done which showed retroperitoneal air in the right upper quadrant likely sequela of recent endoscopic biopsy of portal hepatic mass. GI was consulted by the ED physician, Dr. Carlean Purl reviewed the CT scan and recommended admission for pain control. Small amount of air around the mass and retroperitoneal area likely appropriate given recent biopsy. Patient also found to have leukocytosis with white count of 24,000 and started on empiric Zosyn.  Allergies:   Allergies  Allergen Reactions  . Ciprofloxacin Rash      Past Medical History  Diagnosis Date  . Depression   . Chronic constipation   . Diverticulosis 01/31/2008    colonoscopy Dr Gala Romney  . Obesity   . Schizoaffective disorder     Mental Health-Dr Elias Else  . PUD (peptic ulcer disease) 1988  . Colonic inertia   . Hyperplastic colon polyp   . Diverticula of colon   . Cirrhosis ([place])     child pugh class A and MELD 5, completed hep A and B vaccine series, no HCC on 3/16 MRI    Past Surgical History  Procedure Laterality Date  . Tonsillectomy    . S/p hysterectomy      partial  . Gastric bypass      with Revision, Dr. Duffy Rhody  . Wrist surgery    . Foot surgery      left  . Cholecystectomy  08/2010    cholelithiasis; Dr Arnoldo Morale  . Colonoscopy  01/31/2008    Dr. Gala Romney- normal rectum, L sided  diverticula, long tortuous colon. hyperplasitic  polyp.  . Ercp w/ sphincterotomy and balloon dilation  08/05/2010    normal ampulla/mild erythema in the gastric remnant without ulcerations or erosions. normal retroflexed view of the cardia/anastomosis normal/distal common bile duct stones s/p extraction and shincterotomy  . Esophagogastroduodenoscopy N/A 10/24/2013    [id number] esophagus. Surgically altered stomach. Abnormal gastric mucosa  -  status post biopsy (reactive gastrophathy)  . Eus N/A 08/20/2015    Procedure: ESOPHAGEAL ENDOSCOPIC ULTRASOUND (EUS) RADIAL;  Surgeon: Milus Banister, MD;  Location: [facility] ENDOSCOPY;  Service: Endoscopy;  Laterality: N/A;    Prior to Admission medications   Medication Sig Start Date End Date Taking? Authorizing Provider  buPROPion (WELLBUTRIN SR) 150 MG 12 hr tablet Take 150 mg by mouth daily. 07/20/15   Historical Provider, MD  calcium-vitamin D (OSCAL WITH D) 500-200 MG-UNIT tablet Take 1 tablet by mouth 2 (two) times daily.    Historical Provider, MD  FLUoxetine (PROZAC) 40 MG capsule Take 40 mg by mouth daily.  06/06/12   Historical Provider, MD  LINZESS 290 MCG CAPS capsule TAKE ONE CAPSULE BY MOUTH DAILY 30 MINUTES BEFORE BREAKFAST. 07/20/15   Carlis Stable, NP  pantoprazole (PROTONIX) 40 MG tablet Take 1 tablet (40 mg total) by mouth daily. 08/04/15   Sinda Du, MD  risperiDONE (RISPERDAL) 2 MG tablet Take 2 mg by mouth at bedtime.    Historical  Provider, MD  traZODone (DESYREL) 150 MG tablet Take 300 mg by mouth at bedtime. 07/20/15   Historical Provider, MD    Social History:  reports that she quit smoking about 6 weeks ago. Her smoking use included Cigarettes. She has a 30 pack-year smoking history. She has quit using smokeless tobacco. She reports that she does not drink alcohol or use illicit drugs.  Family History  Problem Relation Age of Onset  . Colon cancer Maternal Uncle 68  . Diabetes Father   . Liver disease Neg Hx     Filed  Weights   08/21/15 2251  Weight: 86.183 kg (190 lb)    All the positives are listed in BOLD  Review of Systems:  HEENT: Headache, blurred vision, runny nose, sore throat Neck: Hypothyroidism, hyperthyroidism,,lymphadenopathy Chest : Shortness of breath, history of COPD, Asthma Heart : Chest pain, history of coronary arterey disease GI:  Nausea, vomiting, diarrhea, constipation, GERD GU: Dysuria, urgency, frequency of urination, hematuria Neuro: Stroke, seizures, syncope Psych: Depression, anxiety, hallucinations   Physical Exam: Blood pressure 130/66, pulse 83, temperature 97.7 F (36.5 C), temperature source Oral, resp. rate 18, height 5\' 4"  (1.626 m), weight 86.183 kg (190 lb), SpO2 98 %. Constitutional:   Patient is a well-developed and well-nourished female* in no acute distress and cooperative with exam. Head: Normocephalic and atraumatic Mouth: Mucus membranes moist Eyes: PERRL, EOMI, conjunctivae normal Neck: Supple, No Thyromegaly Cardiovascular: RRR, S1 normal, S2 normal Pulmonary/Chest: CTAB, no wheezes, rales, or rhonchi Abdominal: Soft. Tender to palpation, non-distended, bowel sounds are normal, no masses, organomegaly, or guarding present.  Neurological: A&O x3, Strength is normal and symmetric bilaterally, cranial nerve II-XII are grossly intact, no focal motor deficit, sensory intact to light touch bilaterally.  Extremities : No Cyanosis, Clubbing or Edema  Labs on Admission:  Basic Metabolic Panel:  Recent Labs Lab 08/22/15 0023  NA 137  K 3.4*  CL 101  CO2 22  GLUCOSE 217*  BUN 12  CREATININE 1.08*  CALCIUM 9.3   Liver Function Tests:  Recent Labs Lab 08/22/15 0023  AST 17  ALT 14  ALKPHOS 84  BILITOT 0.8  PROT 7.3  ALBUMIN 4.0    Recent Labs Lab 08/22/15 0023  LIPASE 39   No results for input(s): AMMONIA in the last 168 hours. CBC:  Recent Labs Lab 08/22/15 0023  WBC 24.0*  NEUTROABS 21.5*  HGB 14.1  HCT 41.7  MCV 90.8    PLT 349    Radiological Exams on Admission: Ct Abdomen Pelvis W Contrast  08/22/2015  CLINICAL DATA:  Sharp umbilical abdominal pain and nausea, onset tonight. Abnormal x-ray. EXAM: CT ABDOMEN AND PELVIS WITH CONTRAST TECHNIQUE: Multidetector CT imaging of the abdomen and pelvis was performed using the standard protocol following bolus administration of intravenous contrast. CONTRAST:  45mL OMNIPAQUE IOHEXOL 300 MG/ML SOLN, 158mL OMNIPAQUE IOHEXOL 300 MG/ML SOLN COMPARISON:  Abdominal radiographs 1 day prior.  CT 07/30/2015 FINDINGS: Lower chest:  The included lung bases are clear. Liver: No focal hepatic lesion. Low-attenuation mass at the porta hepatis measures 6.1 x 4.6 cm, previously 5.8 x 4.9 cm, some internal and very lesional air. Air tracks along the portal vein and in the right retroperitoneum. Hepatobiliary: Postcholecystectomy with clips in the gallbladder fossa. Pancreas: No ductal dilatation. Small amount of fluid adjacent to the pancreatic head and in the retroperitoneum. Spleen: Normal in size and density. Adrenal glands: No nodule. Kidneys: Symmetric renal enhancement and excretion. No hydronephrosis. No perinephric stranding. Stomach/Bowel: Patient  is post gastric bypass. High-density material in the excluded gastric lumen is unchanged. There is periduodenal soft tissue stranding and small amount of free fluid. There are no dilated or thickened small bowel loops. Small volume of stool throughout the colon without colonic wall thickening. Right upper ventral abdominal wall hernia contains normal appearing loop of colon. Submucosal fatty infiltration of the ascending colon, unchanged. The appendix is normal. Vascular/Lymphatic: No retroperitoneal adenopathy. Abdominal aorta is normal in caliber. Moderate atherosclerosis without aneurysm. Reproductive: Uterus surgically absent.  No adnexal mass. Bladder: Physiologically distended. Other: Retroperitoneal air in the right upper quadrant of the  abdomen. Air appears adjacent to the mass in the porta hepatis. Musculoskeletal: There are no acute or suspicious osseous abnormalities. IMPRESSION: 1. Retroperitoneal air in the right upper quadrant, likely sequela of recent endoscopic biopsy of porta hepatis mass. Air appears centered about the mass, tracking about the porta hepatis and right retroperitoneum. Small amount of adjacent retroperitoneal free fluid. No confluent fluid collection or abscess. 2. Right lateral ventral abdominal wall hernia containing normal appearing loops of colon, no obstruction or inflammation. These results were called by telephone at the time of interpretation on 08/22/2015 at 2:09 am to Dr. Thayer Jew , who verbally acknowledged these results. Electronically Signed   By: Jeb Levering M.D.   On: 08/22/2015 02:12   Dg Abd Acute W/chest  08/22/2015  CLINICAL DATA:  Upper abdominal pain, nausea, and vomiting for 4 hours. Productive cough. Recent procedure. EXAM: DG ABDOMEN ACUTE W/ 1V CHEST COMPARISON:  08/01/2015 FINDINGS: Normal heart size and pulmonary vascularity. No focal airspace disease or consolidation in the lungs. No blunting of costophrenic angles. No pneumothorax. Mediastinal contours appear intact. Scattered gas and stool in the colon. No small or large bowel distention. Linear gas collections along the psoas margins likely representing retroperitoneal air. No free intra-abdominal air. No abnormal air-fluid levels. No radiopaque stones. Surgical clips in the upper abdomen. Degenerative changes in the lumbar spine. Calcified phleboliths in the pelvis. IMPRESSION: No evidence of active pulmonary disease. Suggestion of retroperitoneal gas in the abdomen. Duodenum perforation could have this appearance. Suggest CT for further evaluation. Nonobstructive bowel gas pattern. These results were called by telephone at the time of interpretation on 08/22/2015 at 12:22 am to Dr. Thayer Jew , who verbally acknowledged  these results. Electronically Signed   By: Lucienne Capers M.D.   On: 08/22/2015 00:25      Assessment/Plan Active Problems:   Abdominal pain   Nausea   Abdominal mass   Pain following surgery or procedure  Abdominal pain Patient presenting with abdominal pain following the biopsy. CT scan showed no significant abnormality except for retroperitoneal air in the right upper quadrant status post biopsy. Will admit the patient for pain control start Dilaudid 1 mg every 4 hours when necessary. Will give Zofran. For nausea.  Leukocytosis Patient has been afebrile, chest x-ray shows no pneumonia, will check UA Patient given 1 dose of Zosyn in the ED Will repeat CBC in a.m.  Abdominal mass Patient is status post biopsy, will follow GI as outpatient.  Hypokalemia Replace potassium and check BMP in a.m.  DVT prophylaxis Lovenox  Code status: DO NOT RESUSCITATE  Family discussion: No family present at bedside   Time Spent on Admission: 60 min  [facility] Pager: [phone] 08/22/2015, 3:27 AM  If 7PM-7AM, please contact night-coverage  [email or web address]  Password [email or web address]

## 2015-08-22 NOTE — Progress Notes (Signed)
Patient's lactic acid is 2.9, MD made aware.

## 2015-08-22 NOTE — Progress Notes (Signed)
ANTIBIOTIC CONSULT NOTE - INITIAL  Pharmacy Consult for Zosyn Indication: Intra-abdominal infection  Allergies  Allergen Reactions  . Ciprofloxacin Rash    Patient Measurements: Height: 5\' 4"  (162.6 cm) Weight: 186 lb 4.6 oz (84.5 kg) IBW/kg (Calculated) : 54.7  Vital Signs: Temp: 98.7 F (37.1 C) (12/17 1432) Temp Source: Oral (12/17 1432) BP: 142/78 mmHg (12/17 1432) Pulse Rate: 82 (12/17 1432) Intake/Output from previous day:   Intake/Output from this shift:    Labs:  Recent Labs  08/22/15 0023 08/22/15 0608  WBC 24.0* 31.1*  HGB 14.1 13.9  PLT 349 374  CREATININE 1.08* 0.89   Estimated Creatinine Clearance: 71.6 mL/min (by C-G formula based on Cr of 0.89). No results for input(s): VANCOTROUGH, VANCOPEAK, VANCORANDOM, GENTTROUGH, GENTPEAK, GENTRANDOM, TOBRATROUGH, TOBRAPEAK, TOBRARND, AMIKACINPEAK, AMIKACINTROU, AMIKACIN in the last 72 hours.   Microbiology: No results found for this or any previous visit (from the past 720 hour(s)).  Medical History: Past Medical History  Diagnosis Date  . Depression   . Chronic constipation   . Diverticulosis 01/31/2008    colonoscopy Dr Gala Romney  . Obesity   . Schizoaffective disorder     Mental Health-Dr Elias Else  . PUD (peptic ulcer disease) 1988  . Colonic inertia   . Hyperplastic colon polyp   . Diverticula of colon   . Cirrhosis ([place])     child pugh class A and MELD 5, completed hep A and B vaccine series, no HCC on 3/16 MRI    Medications:  Scheduled:  . buPROPion  150 mg Oral Daily  . enoxaparin (LOVENOX) injection  40 mg Subcutaneous Q24H  . FLUoxetine  40 mg Oral Daily  . pantoprazole  40 mg Oral Daily  . piperacillin-tazobactam (ZOSYN)  IV  3.375 g Intravenous Q8H  . risperiDONE  2 mg Oral QHS  . traZODone  300 mg Oral QHS   Assessment: 59 yo female with ultrasound biopsy yesterday of an enlarging hepatic mass. Patient presents with abdominal pain and nausea. CT scan of abdomen shoed retroperitoneal air  in the RUQ most likely due to biopsy. Elevated WBC, afebrile, LA=2.9. Empiric tx with zosyn.  Goal of Therapy:  resolution of infection  Plan:  Zosyn 3.375gm IV q8h extended dosing interval Follow up culture results  Monitor V/S and labs  Isac Sarna, BS Vena [place], BCPS Clinical Pharmacist Pager [phone] 08/22/2015,7:18 PM

## 2015-08-23 DIAGNOSIS — R1010 Upper abdominal pain, unspecified: Secondary | ICD-10-CM | POA: Diagnosis not present

## 2015-08-23 DIAGNOSIS — E876 Hypokalemia: Secondary | ICD-10-CM | POA: Diagnosis present

## 2015-08-23 DIAGNOSIS — Z66 Do not resuscitate: Secondary | ICD-10-CM | POA: Diagnosis present

## 2015-08-23 DIAGNOSIS — G8918 Other acute postprocedural pain: Secondary | ICD-10-CM | POA: Diagnosis not present

## 2015-08-23 DIAGNOSIS — Z6832 Body mass index (BMI) 32.0-32.9, adult: Secondary | ICD-10-CM | POA: Diagnosis not present

## 2015-08-23 DIAGNOSIS — Z5189 Encounter for other specified aftercare: Secondary | ICD-10-CM | POA: Diagnosis not present

## 2015-08-23 DIAGNOSIS — K7581 Nonalcoholic steatohepatitis (NASH): Secondary | ICD-10-CM | POA: Diagnosis present

## 2015-08-23 DIAGNOSIS — F259 Schizoaffective disorder, unspecified: Secondary | ICD-10-CM | POA: Diagnosis present

## 2015-08-23 DIAGNOSIS — Z87891 Personal history of nicotine dependence: Secondary | ICD-10-CM | POA: Diagnosis not present

## 2015-08-23 DIAGNOSIS — R1900 Intra-abdominal and pelvic swelling, mass and lump, unspecified site: Secondary | ICD-10-CM | POA: Diagnosis present

## 2015-08-23 DIAGNOSIS — R1033 Periumbilical pain: Secondary | ICD-10-CM | POA: Diagnosis not present

## 2015-08-23 DIAGNOSIS — E669 Obesity, unspecified: Secondary | ICD-10-CM | POA: Diagnosis present

## 2015-08-23 DIAGNOSIS — K659 Peritonitis, unspecified: Secondary | ICD-10-CM | POA: Diagnosis present

## 2015-08-23 DIAGNOSIS — R109 Unspecified abdominal pain: Secondary | ICD-10-CM | POA: Diagnosis not present

## 2015-08-23 DIAGNOSIS — R110 Nausea: Secondary | ICD-10-CM | POA: Diagnosis not present

## 2015-08-23 DIAGNOSIS — K439 Ventral hernia without obstruction or gangrene: Secondary | ICD-10-CM | POA: Diagnosis present

## 2015-08-23 DIAGNOSIS — R112 Nausea with vomiting, unspecified: Secondary | ICD-10-CM | POA: Diagnosis not present

## 2015-08-23 DIAGNOSIS — R1013 Epigastric pain: Secondary | ICD-10-CM | POA: Diagnosis not present

## 2015-08-23 DIAGNOSIS — K7460 Unspecified cirrhosis of liver: Secondary | ICD-10-CM | POA: Diagnosis present

## 2015-08-23 DIAGNOSIS — I8500 Esophageal varices without bleeding: Secondary | ICD-10-CM | POA: Diagnosis present

## 2015-08-23 DIAGNOSIS — F329 Major depressive disorder, single episode, unspecified: Secondary | ICD-10-CM | POA: Diagnosis present

## 2015-08-23 DIAGNOSIS — K5900 Constipation, unspecified: Secondary | ICD-10-CM | POA: Diagnosis present

## 2015-08-23 DIAGNOSIS — Z881 Allergy status to other antibiotic agents status: Secondary | ICD-10-CM | POA: Diagnosis not present

## 2015-08-23 DIAGNOSIS — Z86010 Personal history of colonic polyps: Secondary | ICD-10-CM | POA: Diagnosis not present

## 2015-08-23 DIAGNOSIS — Z9884 Bariatric surgery status: Secondary | ICD-10-CM | POA: Diagnosis not present

## 2015-08-23 LAB — CBC WITH DIFFERENTIAL/PLATELET
Basophils Absolute: 0 10*3/uL (ref 0.0–0.1)
Basophils Relative: 0 %
Eosinophils Absolute: 0 10*3/uL (ref 0.0–0.7)
Eosinophils Relative: 0 %
HCT: 38.4 % (ref 36.0–46.0)
Hemoglobin: 12.8 g/dL (ref 12.0–15.0)
Lymphocytes Relative: 4 %
Lymphs Abs: 0.9 10*3/uL (ref 0.7–4.0)
MCH: 30.1 pg (ref 26.0–34.0)
MCHC: 33.3 g/dL (ref 30.0–36.0)
MCV: 90.4 fL (ref 78.0–100.0)
Monocytes Absolute: 1.4 10*3/uL — ABNORMAL HIGH (ref 0.1–1.0)
Monocytes Relative: 6 %
Neutro Abs: 20.7 10*3/uL — ABNORMAL HIGH (ref 1.7–7.7)
Neutrophils Relative %: 90 %
Platelets: 326 10*3/uL (ref 150–400)
RBC: 4.25 MIL/uL (ref 3.87–5.11)
RDW: 12.9 % (ref 11.5–15.5)
WBC: 23 10*3/uL — ABNORMAL HIGH (ref 4.0–10.5)

## 2015-08-23 LAB — BASIC METABOLIC PANEL
Anion gap: 7 (ref 5–15)
BUN: 16 mg/dL (ref 6–20)
CO2: 24 mmol/L (ref 22–32)
Calcium: 8.8 mg/dL — ABNORMAL LOW (ref 8.9–10.3)
Chloride: 105 mmol/L (ref 101–111)
Creatinine, Ser: 0.93 mg/dL (ref 0.44–1.00)
GFR calc Af Amer: >60 mL/min (ref >60)
GFR calc non Af Amer: >60 mL/min (ref >60)
Glucose, Bld: 108 mg/dL — ABNORMAL HIGH (ref 65–99)
Potassium: 4 mmol/L (ref 3.5–5.1)
Sodium: 136 mmol/L (ref 135–145)

## 2015-08-23 LAB — LACTIC ACID, PLASMA: Lactic Acid, Venous: 0.9 mmol/L (ref 0.5–2.0)

## 2015-08-23 NOTE — Progress Notes (Signed)
Subjective: She was admitted for presumed intra-abdominal infection following a biopsy during endoscopic ultrasound. She says she feels a little better. She still has some nausea but no vomiting. She still has some abdominal pain but it's generally better  Objective: Vital signs in last 24 hours: Temp:  [98.7 F (37.1 C)-99 F (37.2 C)] 99 F (37.2 C) (12/18 0640) Pulse Rate:  [70-82] 73 (12/18 0640) Resp:  [20] 20 (12/18 0640) BP: (111-142)/(49-78) 111/49 mmHg (12/18 0640) SpO2:  [96 %-98 %] 98 % (12/18 0640) Weight change:  Last BM Date: 08/22/15  Intake/Output from previous day: 12/17 0701 - 12/18 0700 In: 2036.3 [I.V.:1936.3; IV Piggyback:100] Out: -   PHYSICAL EXAM General appearance: alert, cooperative, mild distress and morbidly obese Resp: clear to auscultation bilaterally Cardio: regular rate and rhythm, S1, S2 normal, no murmur, click, rub or gallop GI: She has mild abdominal tenderness diffusely with no rebound Extremities: extremities normal, atraumatic, no cyanosis or edema  Lab Results:  Results for orders placed or performed during the hospital encounter of 08/21/15 (from the past 48 hour(s))  CBC with Differential     Status: Abnormal   Collection Time: 08/22/15 12:23 AM  Result Value Ref Range   WBC 24.0 (H) 4.0 - 10.5 K/uL   RBC 4.59 3.87 - 5.11 MIL/uL   Hemoglobin 14.1 12.0 - 15.0 g/dL   HCT 41.7 36.0 - 46.0 %   MCV 90.8 78.0 - 100.0 fL   MCH 30.7 26.0 - 34.0 pg   MCHC 33.8 30.0 - 36.0 g/dL   RDW 12.8 11.5 - 15.5 %   Platelets 349 150 - 400 K/uL   Neutrophils Relative % 90 %   Neutro Abs 21.5 (H) 1.7 - 7.7 K/uL   Lymphocytes Relative 3 %   Lymphs Abs 0.8 0.7 - 4.0 K/uL   Monocytes Relative 7 %   Monocytes Absolute 1.7 (H) 0.1 - 1.0 K/uL   Eosinophils Relative 0 %   Eosinophils Absolute 0.0 0.0 - 0.7 K/uL   Basophils Relative 0 %   Basophils Absolute 0.0 0.0 - 0.1 K/uL  Comprehensive metabolic panel     Status: Abnormal   Collection Time:  08/22/15 12:23 AM  Result Value Ref Range   Sodium 137 135 - 145 mmol/L   Potassium 3.4 (L) 3.5 - 5.1 mmol/L   Chloride 101 101 - 111 mmol/L   CO2 22 22 - 32 mmol/L   Glucose, Bld 217 (H) 65 - 99 mg/dL   BUN 12 6 - 20 mg/dL   Creatinine, Ser 1.08 (H) 0.44 - 1.00 mg/dL   Calcium 9.3 8.9 - 10.3 mg/dL   Total Protein 7.3 6.5 - 8.1 g/dL   Albumin 4.0 3.5 - 5.0 g/dL   AST 17 15 - 41 U/L   ALT 14 14 - 54 U/L   Alkaline Phosphatase 84 38 - 126 U/L   Total Bilirubin 0.8 0.3 - 1.2 mg/dL   GFR calc non Af Amer 55 (L) >60 mL/min   GFR calc Af Amer >60 >60 mL/min    Comment: (NOTE) The eGFR has been calculated using the CKD EPI equation. This calculation has not been validated in all clinical situations. eGFR's persistently <60 mL/min signify possible Chronic Kidney Disease.    Anion gap 14 5 - 15  Lipase, blood     Status: None   Collection Time: 08/22/15 12:23 AM  Result Value Ref Range   Lipase 39 11 - 51 U/L  Lactic acid, plasma  Status: None   Collection Time: 08/22/15 12:23 AM  Result Value Ref Range   Lactic Acid, Venous 1.7 0.5 - 2.0 mmol/L  Lactic acid, plasma     Status: Abnormal   Collection Time: 08/22/15  2:31 AM  Result Value Ref Range   Lactic Acid, Venous 2.9 (HH) 0.5 - 2.0 mmol/L    Comment: CRITICAL RESULT CALLED TO, READ BACK BY AND VERIFIED WITH: THOMAS,K AT 0405 BY HUFFINES,S ON 08/22/15.   CBC     Status: Abnormal   Collection Time: 08/22/15  6:08 AM  Result Value Ref Range   WBC 31.1 (H) 4.0 - 10.5 K/uL   RBC 4.63 3.87 - 5.11 MIL/uL   Hemoglobin 13.9 12.0 - 15.0 g/dL   HCT 42.1 36.0 - 46.0 %   MCV 90.9 78.0 - 100.0 fL   MCH 30.0 26.0 - 34.0 pg   MCHC 33.0 30.0 - 36.0 g/dL   RDW 12.8 11.5 - 15.5 %   Platelets 374 150 - 400 K/uL  Comprehensive metabolic panel     Status: Abnormal   Collection Time: 08/22/15  6:08 AM  Result Value Ref Range   Sodium 137 135 - 145 mmol/L   Potassium 3.4 (L) 3.5 - 5.1 mmol/L   Chloride 106 101 - 111 mmol/L   CO2 22  22 - 32 mmol/L   Glucose, Bld 171 (H) 65 - 99 mg/dL   BUN 11 6 - 20 mg/dL   Creatinine, Ser 0.89 0.44 - 1.00 mg/dL   Calcium 8.9 8.9 - 10.3 mg/dL   Total Protein 6.8 6.5 - 8.1 g/dL   Albumin 3.5 3.5 - 5.0 g/dL   AST 16 15 - 41 U/L   ALT 13 (L) 14 - 54 U/L   Alkaline Phosphatase 81 38 - 126 U/L   Total Bilirubin 0.7 0.3 - 1.2 mg/dL   GFR calc non Af Amer >60 >60 mL/min   GFR calc Af Amer >60 >60 mL/min    Comment: (NOTE) The eGFR has been calculated using the CKD EPI equation. This calculation has not been validated in all clinical situations. eGFR's persistently <60 mL/min signify possible Chronic Kidney Disease.    Anion gap 9 5 - 15  CBC with Differential/Platelet     Status: Abnormal   Collection Time: 08/23/15  6:03 AM  Result Value Ref Range   WBC 23.0 (H) 4.0 - 10.5 K/uL   RBC 4.25 3.87 - 5.11 MIL/uL   Hemoglobin 12.8 12.0 - 15.0 g/dL   HCT 38.4 36.0 - 46.0 %   MCV 90.4 78.0 - 100.0 fL   MCH 30.1 26.0 - 34.0 pg   MCHC 33.3 30.0 - 36.0 g/dL   RDW 12.9 11.5 - 15.5 %   Platelets 326 150 - 400 K/uL   Neutrophils Relative % 90 %   Neutro Abs 20.7 (H) 1.7 - 7.7 K/uL   Lymphocytes Relative 4 %   Lymphs Abs 0.9 0.7 - 4.0 K/uL   Monocytes Relative 6 %   Monocytes Absolute 1.4 (H) 0.1 - 1.0 K/uL   Eosinophils Relative 0 %   Eosinophils Absolute 0.0 0.0 - 0.7 K/uL   Basophils Relative 0 %   Basophils Absolute 0.0 0.0 - 0.1 K/uL  Basic metabolic panel     Status: Abnormal   Collection Time: 08/23/15  6:03 AM  Result Value Ref Range   Sodium 136 135 - 145 mmol/L   Potassium 4.0 3.5 - 5.1 mmol/L   Chloride 105 101 - 111  mmol/L   CO2 24 22 - 32 mmol/L   Glucose, Bld 108 (H) 65 - 99 mg/dL   BUN 16 6 - 20 mg/dL   Creatinine, Ser 0.93 0.44 - 1.00 mg/dL   Calcium 8.8 (L) 8.9 - 10.3 mg/dL   GFR calc non Af Amer >60 >60 mL/min   GFR calc Af Amer >60 >60 mL/min    Comment: (NOTE) The eGFR has been calculated using the CKD EPI equation. This calculation has not been validated  in all clinical situations. eGFR's persistently <60 mL/min signify possible Chronic Kidney Disease.    Anion gap 7 5 - 15  Lactic acid, plasma     Status: None   Collection Time: 08/23/15  6:03 AM  Result Value Ref Range   Lactic Acid, Venous 0.9 0.5 - 2.0 mmol/L    ABGS No results for input(s): PHART, PO2ART, TCO2, HCO3 in the last 72 hours.  Invalid input(s): PCO2 CULTURES No results found for this or any previous visit (from the past 240 hour(s)). Studies/Results: Ct Abdomen Pelvis W Contrast  08/22/2015  CLINICAL DATA:  Sharp umbilical abdominal pain and nausea, onset tonight. Abnormal x-ray. EXAM: CT ABDOMEN AND PELVIS WITH CONTRAST TECHNIQUE: Multidetector CT imaging of the abdomen and pelvis was performed using the standard protocol following bolus administration of intravenous contrast. CONTRAST:  66m OMNIPAQUE IOHEXOL 300 MG/ML SOLN, 1062mOMNIPAQUE IOHEXOL 300 MG/ML SOLN COMPARISON:  Abdominal radiographs 1 day prior.  CT 07/30/2015 FINDINGS: Lower chest:  The included lung bases are clear. Liver: No focal hepatic lesion. Low-attenuation mass at the porta hepatis measures 6.1 x 4.6 cm, previously 5.8 x 4.9 cm, some internal and very lesional air. Air tracks along the portal vein and in the right retroperitoneum. Hepatobiliary: Postcholecystectomy with clips in the gallbladder fossa. Pancreas: No ductal dilatation. Small amount of fluid adjacent to the pancreatic head and in the retroperitoneum. Spleen: Normal in size and density. Adrenal glands: No nodule. Kidneys: Symmetric renal enhancement and excretion. No hydronephrosis. No perinephric stranding. Stomach/Bowel: Patient is post gastric bypass. High-density material in the excluded gastric lumen is unchanged. There is periduodenal soft tissue stranding and small amount of free fluid. There are no dilated or thickened small bowel loops. Small volume of stool throughout the colon without colonic wall thickening. Right upper ventral  abdominal wall hernia contains normal appearing loop of colon. Submucosal fatty infiltration of the ascending colon, unchanged. The appendix is normal. Vascular/Lymphatic: No retroperitoneal adenopathy. Abdominal aorta is normal in caliber. Moderate atherosclerosis without aneurysm. Reproductive: Uterus surgically absent.  No adnexal mass. Bladder: Physiologically distended. Other: Retroperitoneal air in the right upper quadrant of the abdomen. Air appears adjacent to the mass in the porta hepatis. Musculoskeletal: There are no acute or suspicious osseous abnormalities. IMPRESSION: 1. Retroperitoneal air in the right upper quadrant, likely sequela of recent endoscopic biopsy of porta hepatis mass. Air appears centered about the mass, tracking about the porta hepatis and right retroperitoneum. Small amount of adjacent retroperitoneal free fluid. No confluent fluid collection or abscess. 2. Right lateral ventral abdominal wall hernia containing normal appearing loops of colon, no obstruction or inflammation. These results were called by telephone at the time of interpretation on 08/22/2015 at 2:09 am to Dr. COThayer Jew who verbally acknowledged these results. Electronically Signed   By: MeJeb Levering.D.   On: 08/22/2015 02:12   Dg Abd Acute W/chest  08/22/2015  CLINICAL DATA:  Upper abdominal pain, nausea, and vomiting for 4 hours. Productive cough. Recent procedure.  EXAM: DG ABDOMEN ACUTE W/ 1V CHEST COMPARISON:  08/01/2015 FINDINGS: Normal heart size and pulmonary vascularity. No focal airspace disease or consolidation in the lungs. No blunting of costophrenic angles. No pneumothorax. Mediastinal contours appear intact. Scattered gas and stool in the colon. No small or large bowel distention. Linear gas collections along the psoas margins likely representing retroperitoneal air. No free intra-abdominal air. No abnormal air-fluid levels. No radiopaque stones. Surgical clips in the upper abdomen.  Degenerative changes in the lumbar spine. Calcified phleboliths in the pelvis. IMPRESSION: No evidence of active pulmonary disease. Suggestion of retroperitoneal gas in the abdomen. Duodenum perforation could have this appearance. Suggest CT for further evaluation. Nonobstructive bowel gas pattern. These results were called by telephone at the time of interpretation on 08/22/2015 at 12:22 am to Dr. Thayer Jew , who verbally acknowledged these results. Electronically Signed   By: Lucienne Capers M.D.   On: 08/22/2015 00:25    Medications:  Prior to Admission:  Prescriptions prior to admission  Medication Sig Dispense Refill Last Dose  . buPROPion (WELLBUTRIN SR) 150 MG 12 hr tablet Take 300 mg by mouth daily.    08/21/2015 at Unknown time  . calcium-vitamin D (OSCAL WITH D) 500-200 MG-UNIT tablet Take 1 tablet by mouth 2 (two) times daily.   08/21/2015 at Unknown time  . FLUoxetine (PROZAC) 40 MG capsule Take 40 mg by mouth daily.    08/21/2015 at Unknown time  . LINZESS 290 MCG CAPS capsule TAKE ONE CAPSULE BY MOUTH DAILY 30 MINUTES BEFORE BREAKFAST. 30 capsule 11 08/21/2015 at Unknown time  . pantoprazole (PROTONIX) 40 MG tablet Take 1 tablet (40 mg total) by mouth daily. 30 tablet 12 08/21/2015 at Unknown time  . risperiDONE (RISPERDAL) 2 MG tablet Take 2 mg by mouth at bedtime.   08/21/2015 at Unknown time  . traZODone (DESYREL) 150 MG tablet Take 300 mg by mouth at bedtime.   08/21/2015 at Unknown time   Scheduled: . buPROPion  150 mg Oral Daily  . enoxaparin (LOVENOX) injection  40 mg Subcutaneous Q24H  . FLUoxetine  40 mg Oral Daily  . pantoprazole  40 mg Oral Daily  . piperacillin-tazobactam (ZOSYN)  IV  3.375 g Intravenous Q8H  . risperiDONE  2 mg Oral QHS  . traZODone  300 mg Oral QHS   Continuous: . 0.9 % NaCl with KCl 20 mEq / L 75 mL/hr at 08/23/15 1245   YKD:XIPJASNKNLZJQ (DILAUDID) injection, ondansetron **OR** ondansetron (ZOFRAN) IV  Assesment: She has presumed  abdominal infection following the biopsy. She has an abdominal mass. She had nausea and vomiting. She is better. She is on Zosyn and her white blood cell count has come down. She wants to try some liquids. Active Problems:   Abdominal pain   Nausea   Abdominal mass   Pain following surgery or procedure    Plan: GI consultation in the morning. Start clear liquids. Recheck labs tomorrow      Vandella Ord L 08/23/2015, 9:44 AM

## 2015-08-24 ENCOUNTER — Encounter (HOSPITAL_COMMUNITY): Payer: Self-pay | Admitting: Gastroenterology

## 2015-08-24 DIAGNOSIS — R1013 Epigastric pain: Secondary | ICD-10-CM

## 2015-08-24 DIAGNOSIS — R110 Nausea: Secondary | ICD-10-CM

## 2015-08-24 DIAGNOSIS — R1900 Intra-abdominal and pelvic swelling, mass and lump, unspecified site: Secondary | ICD-10-CM

## 2015-08-24 LAB — CBC WITH DIFFERENTIAL/PLATELET
Basophils Absolute: 0 10*3/uL (ref 0.0–0.1)
Basophils Relative: 0 %
Eosinophils Absolute: 0 10*3/uL (ref 0.0–0.7)
Eosinophils Relative: 0 %
HCT: 37.6 % (ref 36.0–46.0)
Hemoglobin: 12.5 g/dL (ref 12.0–15.0)
Lymphocytes Relative: 4 %
Lymphs Abs: 0.7 10*3/uL (ref 0.7–4.0)
MCH: 29.8 pg (ref 26.0–34.0)
MCHC: 33.2 g/dL (ref 30.0–36.0)
MCV: 89.7 fL (ref 78.0–100.0)
Monocytes Absolute: 1.4 10*3/uL — ABNORMAL HIGH (ref 0.1–1.0)
Monocytes Relative: 8 %
Neutro Abs: 16.8 10*3/uL — ABNORMAL HIGH (ref 1.7–7.7)
Neutrophils Relative %: 88 %
Platelets: 319 10*3/uL (ref 150–400)
RBC: 4.19 MIL/uL (ref 3.87–5.11)
RDW: 12.6 % (ref 11.5–15.5)
WBC: 19 10*3/uL — ABNORMAL HIGH (ref 4.0–10.5)

## 2015-08-24 LAB — BASIC METABOLIC PANEL
Anion gap: 5 (ref 5–15)
BUN: 11 mg/dL (ref 6–20)
CO2: 26 mmol/L (ref 22–32)
Calcium: 8.9 mg/dL (ref 8.9–10.3)
Chloride: 102 mmol/L (ref 101–111)
Creatinine, Ser: 0.82 mg/dL (ref 0.44–1.00)
GFR calc Af Amer: >60 mL/min (ref >60)
GFR calc non Af Amer: >60 mL/min (ref >60)
Glucose, Bld: 116 mg/dL — ABNORMAL HIGH (ref 65–99)
Potassium: 4.1 mmol/L (ref 3.5–5.1)
Sodium: 133 mmol/L — ABNORMAL LOW (ref 135–145)

## 2015-08-24 MED ORDER — ONDANSETRON HCL 4 MG/2ML IJ SOLN
4.0000 mg | Freq: Three times a day (TID) | INTRAMUSCULAR | Status: DC
  Administered 2015-08-24 – 2015-08-26 (×10): 4 mg via INTRAVENOUS
  Filled 2015-08-24 (×11): qty 2

## 2015-08-24 MED ORDER — PANTOPRAZOLE SODIUM 40 MG PO TBEC
40.0000 mg | DELAYED_RELEASE_TABLET | Freq: Two times a day (BID) | ORAL | Status: DC
  Administered 2015-08-24 – 2015-08-27 (×6): 40 mg via ORAL
  Filled 2015-08-24 (×6): qty 1

## 2015-08-24 NOTE — Consult Note (Signed)
Referring Provider: Dr. Luan Pulling Primary Care Physician:  Alonza Bogus, MD Primary Gastroenterologist:  Dr. Gala Romney   Date of Admission: 08/22/15 Date of Consultation: 08/24/15  Reason for Consultation:  Abdominal pain   HPI:  [name] is a 59 y.o. year old female with a history of cirrhosis, previous remote gastric bypass, prior diagnosis of intra-abdominal lesion at the porta hepatis that had enlarged and recently evaluated by Dr. Ardis Hughs via EUS. Findings of  well-circumscribed 5.2 cm chronic mass in porta hepatis, internally mass is gelatinous. Small distal esophageal varices (limited view), Bilroth 1 type anatomy, cytology NEGATIVE for malignant cells. Suspicion for neoplasm low. She is scheduled to see Dr. Barry Dienes for surgical consideration Jan 10th. [name] was previously hospitalized in Nov 2016 with vomiting and abdominal pain.   CT with contrast this admission notes retroperitoneal air in the RUQ, likely sequela of recent EUS with biopsy. Admitted with abdominal pain, leukocytosis with WBC initially 24 then increased to 31. Now slowly declining and 19. States she started hurting in upper abdomen out of the blue, got nauseated. Threw up this morning several times. Still with pain in upper abdomen, persistent. Pain not exacerbated by eating. Didn't eat any clear liquids today. Feels like she is scared to eat or drink anything due to severe nausea. No fever/chills. Throat has felt sore since EUS. Started back smoking a few months ago but then quit again.Tthinks she may have bronchitis. Abdominal pain is better but still with nausea.   206 in April 2016, now 186. States she was trying to lose weight from April until now but hasn't been trying recently. States had started drinking water all the time and walking. No NSAIDs, aspirin powders. Linzess for constipation.   Past Medical History  Diagnosis Date  . Depression   . Chronic constipation   . Diverticulosis 01/31/2008     colonoscopy Dr Gala Romney  . Obesity   . Schizoaffective disorder     Mental Health-Dr Elias Else  . PUD (peptic ulcer disease) 1988  . Colonic inertia   . Hyperplastic colon polyp   . Diverticula of colon   . Cirrhosis ([place])     child pugh class A and MELD 5, completed hep A and B vaccine series, no HCC on 3/16 MRI    Past Surgical History  Procedure Laterality Date  . Tonsillectomy    . S/p hysterectomy      partial  . Gastric bypass      with Revision, Dr. Duffy Rhody  . Wrist surgery    . Foot surgery      left  . Cholecystectomy  08/2010    cholelithiasis; Dr Arnoldo Morale  . Colonoscopy  01/31/2008    Dr. Gala Romney- normal rectum, L sided diverticula, long tortuous colon. hyperplasitic  polyp.  . Ercp w/ sphincterotomy and balloon dilation  08/05/2010    normal ampulla/mild erythema in the gastric remnant without ulcerations or erosions. normal retroflexed view of the cardia/anastomosis normal/distal common bile duct stones s/p extraction and shincterotomy  . Esophagogastroduodenoscopy N/A 10/24/2013    [id number] esophagus. Surgically altered stomach. Abnormal gastric mucosa  -  status post biopsy (reactive gastrophathy)  . Eus N/A 08/20/2015    Dr. Ardis Hughs: well-circumscribed 5.2 cm chronic mass in porta hepatis, internally mass is gelatinous. Small distal esophageal varices (limited view), Bilroth 1 type anatomy, cytology NEGATIVE for malignant cells. Suspicion for neoplasm low     Prior to Admission medications   Medication Sig Start Date End Date Taking? Authorizing Provider  buPROPion (WELLBUTRIN SR) 150 MG 12 hr tablet Take 300 mg by mouth daily.  07/20/15  Yes Historical Provider, MD  calcium-vitamin D (OSCAL WITH D) 500-200 MG-UNIT tablet Take 1 tablet by mouth 2 (two) times daily.   Yes Historical Provider, MD  FLUoxetine (PROZAC) 40 MG capsule Take 40 mg by mouth daily.  06/06/12  Yes Historical Provider, MD  LINZESS 290 MCG CAPS capsule TAKE ONE CAPSULE BY MOUTH DAILY 30 MINUTES  BEFORE BREAKFAST. 07/20/15  Yes Carlis Stable, NP  pantoprazole (PROTONIX) 40 MG tablet Take 1 tablet (40 mg total) by mouth daily. 08/04/15  Yes Sinda Du, MD  risperiDONE (RISPERDAL) 2 MG tablet Take 2 mg by mouth at bedtime.   Yes Historical Provider, MD  traZODone (DESYREL) 150 MG tablet Take 300 mg by mouth at bedtime. 07/20/15  Yes Historical Provider, MD    Current Facility-Administered Medications  Medication Dose Route Frequency Provider Last Rate Last Dose  . 0.9 % NaCl with KCl 20 mEq/ L  infusion   Intravenous Continuous Oswald Hillock, MD 75 mL/hr at 08/24/15 0007    . buPROPion [facility] SR) 12 hr tablet 150 mg  150 mg Oral Daily Oswald Hillock, MD   150 mg at 08/23/15 0958  . enoxaparin (LOVENOX) injection 40 mg  40 mg Subcutaneous Q24H Oswald Hillock, MD   40 mg at 08/24/15 0841  . FLUoxetine (PROZAC) capsule 40 mg  40 mg Oral Daily Oswald Hillock, MD   40 mg at 08/24/15 0841  . HYDROmorphone (DILAUDID) injection 1 mg  1 mg Intravenous Q4H PRN Oswald Hillock, MD   1 mg at 08/24/15 0347  . ondansetron (ZOFRAN) tablet 4 mg  4 mg Oral Q6H PRN Oswald Hillock, MD       Or  . ondansetron (ZOFRAN) injection 4 mg  4 mg Intravenous Q6H PRN Oswald Hillock, MD   4 mg at 08/24/15 0347  . pantoprazole (PROTONIX) EC tablet 40 mg  40 mg Oral Daily Oswald Hillock, MD   40 mg at 08/24/15 0841  . piperacillin-tazobactam (ZOSYN) IVPB 3.375 g  3.375 g Intravenous Q8H Sinda Du, MD   3.375 g at 08/24/15 0347  . risperiDONE (RISPERDAL) tablet 2 mg  2 mg Oral QHS Oswald Hillock, MD   2 mg at 08/23/15 2221  . traZODone (DESYREL) tablet 300 mg  300 mg Oral QHS Oswald Hillock, MD   300 mg at 08/23/15 2221    Allergies as of 08/21/2015 - Review Complete 08/21/2015  Allergen Reaction Noted  . Ciprofloxacin Rash     Family History  Problem Relation Age of Onset  . Colon cancer Maternal Uncle 30  . Diabetes Father   . Liver disease Neg Hx     Social History   Social History  . Marital Status: Divorced     Spouse Name: N/A  . Number of Children: 2  . Years of Education: N/A   Occupational History  . disabled    Social History Main Topics  . Smoking status: Former Smoker -- 1.00 packs/day for 30 years    Types: Cigarettes    Quit date: 07/06/2015  . Smokeless tobacco: Former Systems developer     Comment: Quit smoking x 10 years  . Alcohol Use: No  . Drug Use: No  . Sexual Activity: No   Other Topics Concern  . Not on file   Social History Narrative   Lives w/ mother   2  grown children    Review of Systems: As mentioned in HPI   Physical Exam: Vital signs in last 24 hours: Temp:  [98.1 F (36.7 C)-98.9 F (37.2 C)] 98.1 F (36.7 C) (12/19 0642) Pulse Rate:  [74-82] 74 (12/19 0642) Resp:  [18-20] 20 (12/19 0642) BP: (123-133)/(52-68) 133/52 mmHg (12/19 0642) SpO2:  [97 %-98 %] 97 % (12/19 0642) Last BM Date: 08/22/15 General:   Alert,  Well-developed, well-nourished, pleasant and cooperative in NAD Head:  Normocephalic and atraumatic. Eyes:  Sclera clear, no icterus.   Conjunctiva pink. Ears:  Normal auditory acuity. Nose:  No deformity, discharge,  or lesions. Mouth:  No deformity or lesions, dentition normal. Lungs:  Clear throughout to auscultation.   No wheezes, crackles, or rhonchi. No acute distress. Heart:  Regular rate and rhythm; no murmurs, clicks, rubs,  or gallops. Abdomen:  Soft, nontender and nondistended. RUQ ventral hernia. No obvious HSM. No TTP, rebound, or guarding Rectal:  Deferred  Msk:  Symmetrical without gross deformities. Normal posture. Extremities:  Without  edema. Neurologic:  Alert and  oriented x4 Psych:  Alert and cooperative. Normal mood and affect.  Intake/Output from previous day: 12/18 0701 - 12/19 0700 In: 2182.5 [P.O.:240; I.V.:1792.5; IV Piggyback:150] Out: -  Intake/Output this shift:    Lab Results:  Recent Labs  08/22/15 0608 08/23/15 0603 08/24/15 0553  WBC 31.1* 23.0* 19.0*  HGB 13.9 12.8 12.5  HCT 42.1 38.4 37.6   PLT 374 326 319   BMET  Recent Labs  08/22/15 0608 08/23/15 0603 08/24/15 0553  NA 137 136 133*  K 3.4* 4.0 4.1  CL 106 105 102  CO2 22 24 26   GLUCOSE 171* 108* 116*  BUN 11 16 11   CREATININE 0.89 0.93 0.82  CALCIUM 8.9 8.8* 8.9   LFT  Recent Labs  08/22/15 0023 08/22/15 0608  PROT 7.3 6.8  ALBUMIN 4.0 3.5  AST 17 16  ALT 14 13*  ALKPHOS 84 81  BILITOT 0.8 0.7    Studies/Results: CT 08/22/15 IMPRESSION: 1. Retroperitoneal air in the right upper quadrant, likely sequela of recent endoscopic biopsy of porta hepatis mass. Air appears centered about the mass, tracking about the porta hepatis and right retroperitoneum. Small amount of adjacent retroperitoneal free fluid. No confluent fluid collection or abscess. 2. Right lateral ventral abdominal wall hernia containing normal appearing loops of colon, no obstruction or inflammation.  Impression: 59 year old female with history of NASH cirrhosis, prior bariatric surgery in remote past, admitted with recurrent upper abdominal pain, N/V after recent EUS (08/20/15) for mass at porta hepatis. Cytology negative for malignancy, and Dr. Ardis Hughs' suspicion for neoplasm is low. She has been referred to Dr. Barry Dienes for surgical consideration in January 2017. Upon admission, notable leukocytosis, which has been improving. She has remained afebrile. Unclear etiology, and empiric antibiotics (Zosyn) on board.  Abdominal pain much improved but nausea persisting.Findings of retroperitoneal air in RUQ is likely secondary to EUS with FNA biopsy just completed. Most recent CT reviewed with Dr. Ardeen Garland due to post-bariatric procedure anatomy, and no signs of internal hernia noted. Unclear if the mass is causing discomfort at this time. Notable weight loss from earlier this year, and patient endorses trying to lose weight earlier this year. Differentials for abdominal pain numerous: query secondary to mass, less likely mesenteric ischemia but unable  to completely exclude, also unable to exclude intermittent internal hernia that has not been appreciated on CT. EUS did note small esophageal varies but not completely evaluated as  dedicated EGD not performed. Recommend elective EGD, likely as outpatient for full assessment and variceal screening. Will start scheduled Zofran, increase PPI to BID. Could consider CTA this admission to assess mesenteric vasculature if compelling clinically. Keep upcoming appointment with Dr. Barry Dienes to discuss surgical intervention for mass, as this may likely be the culprit for her presentation.   Plan: Start scheduled Zofran PPI BID Elective outpatient EGD for further assessment of varices noted on EUS (last EGD in 2015 without varices) Consider CTA this admission Keep appointment with Dr. Barry Dienes To discuss further with Dr. Gala Romney.   Orvil Feil, ANP-BC [facility]       LOS: 1 day    08/24/2015, 9:14 AM

## 2015-08-24 NOTE — Progress Notes (Signed)
Subjective: She says she feels better. She tolerated clear liquids. Her white blood count is down to 19,000 now. She still has some mild abdominal discomfort and nausea  Objective: Vital signs in last 24 hours: Temp:  [98.1 F (36.7 C)-98.9 F (37.2 C)] 98.1 F (36.7 C) (12/19 0642) Pulse Rate:  [74-82] 74 (12/19 0642) Resp:  [18-20] 20 (12/19 0642) BP: (123-133)/(52-68) 133/52 mmHg (12/19 0642) SpO2:  [97 %-98 %] 97 % (12/19 0642) Weight change:  Last BM Date: 08/22/15  Intake/Output from previous day: 12/18 0701 - 12/19 0700 In: 2182.5 [P.O.:240; I.V.:1792.5; IV Piggyback:150] Out: -   PHYSICAL EXAM General appearance: alert, cooperative and mild distress Resp: clear to auscultation bilaterally Cardio: regular rate and rhythm, S1, S2 normal, no murmur, click, rub or gallop GI: She has mild abdominal tenderness but no rebound Extremities: extremities normal, atraumatic, no cyanosis or edema  Lab Results:  Results for orders placed or performed during the hospital encounter of 08/21/15 (from the past 48 hour(s))  CBC with Differential/Platelet     Status: Abnormal   Collection Time: 08/23/15  6:03 AM  Result Value Ref Range   WBC 23.0 (H) 4.0 - 10.5 K/uL   RBC 4.25 3.87 - 5.11 MIL/uL   Hemoglobin 12.8 12.0 - 15.0 g/dL   HCT 38.4 36.0 - 46.0 %   MCV 90.4 78.0 - 100.0 fL   MCH 30.1 26.0 - 34.0 pg   MCHC 33.3 30.0 - 36.0 g/dL   RDW 12.9 11.5 - 15.5 %   Platelets 326 150 - 400 K/uL   Neutrophils Relative % 90 %   Neutro Abs 20.7 (H) 1.7 - 7.7 K/uL   Lymphocytes Relative 4 %   Lymphs Abs 0.9 0.7 - 4.0 K/uL   Monocytes Relative 6 %   Monocytes Absolute 1.4 (H) 0.1 - 1.0 K/uL   Eosinophils Relative 0 %   Eosinophils Absolute 0.0 0.0 - 0.7 K/uL   Basophils Relative 0 %   Basophils Absolute 0.0 0.0 - 0.1 K/uL  Basic metabolic panel     Status: Abnormal   Collection Time: 08/23/15  6:03 AM  Result Value Ref Range   Sodium 136 135 - 145 mmol/L   Potassium 4.0 3.5 - 5.1  mmol/L   Chloride 105 101 - 111 mmol/L   CO2 24 22 - 32 mmol/L   Glucose, Bld 108 (H) 65 - 99 mg/dL   BUN 16 6 - 20 mg/dL   Creatinine, Ser 0.93 0.44 - 1.00 mg/dL   Calcium 8.8 (L) 8.9 - 10.3 mg/dL   GFR calc non Af Amer >60 >60 mL/min   GFR calc Af Amer >60 >60 mL/min    Comment: (NOTE) The eGFR has been calculated using the CKD EPI equation. This calculation has not been validated in all clinical situations. eGFR's persistently <60 mL/min signify possible Chronic Kidney Disease.    Anion gap 7 5 - 15  Lactic acid, plasma     Status: None   Collection Time: 08/23/15  6:03 AM  Result Value Ref Range   Lactic Acid, Venous 0.9 0.5 - 2.0 mmol/L  CBC with Differential/Platelet     Status: Abnormal   Collection Time: 08/24/15  5:53 AM  Result Value Ref Range   WBC 19.0 (H) 4.0 - 10.5 K/uL   RBC 4.19 3.87 - 5.11 MIL/uL   Hemoglobin 12.5 12.0 - 15.0 g/dL   HCT 37.6 36.0 - 46.0 %   MCV 89.7 78.0 - 100.0 fL   MCH  29.8 26.0 - 34.0 pg   MCHC 33.2 30.0 - 36.0 g/dL   RDW 12.6 11.5 - 15.5 %   Platelets 319 150 - 400 K/uL   Neutrophils Relative % 88 %   Neutro Abs 16.8 (H) 1.7 - 7.7 K/uL   Lymphocytes Relative 4 %   Lymphs Abs 0.7 0.7 - 4.0 K/uL   Monocytes Relative 8 %   Monocytes Absolute 1.4 (H) 0.1 - 1.0 K/uL   Eosinophils Relative 0 %   Eosinophils Absolute 0.0 0.0 - 0.7 K/uL   Basophils Relative 0 %   Basophils Absolute 0.0 0.0 - 0.1 K/uL  Basic metabolic panel     Status: Abnormal   Collection Time: 08/24/15  5:53 AM  Result Value Ref Range   Sodium 133 (L) 135 - 145 mmol/L   Potassium 4.1 3.5 - 5.1 mmol/L   Chloride 102 101 - 111 mmol/L   CO2 26 22 - 32 mmol/L   Glucose, Bld 116 (H) 65 - 99 mg/dL   BUN 11 6 - 20 mg/dL   Creatinine, Ser 0.82 0.44 - 1.00 mg/dL   Calcium 8.9 8.9 - 10.3 mg/dL   GFR calc non Af Amer >60 >60 mL/min   GFR calc Af Amer >60 >60 mL/min    Comment: (NOTE) The eGFR has been calculated using the CKD EPI equation. This calculation has not been  validated in all clinical situations. eGFR's persistently <60 mL/min signify possible Chronic Kidney Disease.    Anion gap 5 5 - 15    ABGS No results for input(s): PHART, PO2ART, TCO2, HCO3 in the last 72 hours.  Invalid input(s): PCO2 CULTURES No results found for this or any previous visit (from the past 240 hour(s)). Studies/Results: No results found.  Medications:  Prior to Admission:  Prescriptions prior to admission  Medication Sig Dispense Refill Last Dose  . buPROPion (WELLBUTRIN SR) 150 MG 12 hr tablet Take 300 mg by mouth daily.    08/21/2015 at Unknown time  . calcium-vitamin D (OSCAL WITH D) 500-200 MG-UNIT tablet Take 1 tablet by mouth 2 (two) times daily.   08/21/2015 at Unknown time  . FLUoxetine (PROZAC) 40 MG capsule Take 40 mg by mouth daily.    08/21/2015 at Unknown time  . LINZESS 290 MCG CAPS capsule TAKE ONE CAPSULE BY MOUTH DAILY 30 MINUTES BEFORE BREAKFAST. 30 capsule 11 08/21/2015 at Unknown time  . pantoprazole (PROTONIX) 40 MG tablet Take 1 tablet (40 mg total) by mouth daily. 30 tablet 12 08/21/2015 at Unknown time  . risperiDONE (RISPERDAL) 2 MG tablet Take 2 mg by mouth at bedtime.   08/21/2015 at Unknown time  . traZODone (DESYREL) 150 MG tablet Take 300 mg by mouth at bedtime.   08/21/2015 at Unknown time   Scheduled: . buPROPion  150 mg Oral Daily  . enoxaparin (LOVENOX) injection  40 mg Subcutaneous Q24H  . FLUoxetine  40 mg Oral Daily  . pantoprazole  40 mg Oral Daily  . piperacillin-tazobactam (ZOSYN)  IV  3.375 g Intravenous Q8H  . risperiDONE  2 mg Oral QHS  . traZODone  300 mg Oral QHS   Continuous: . 0.9 % NaCl with KCl 20 mEq / L 75 mL/hr at 08/24/15 0007   [id number] (DILAUDID) injection, ondansetron **OR** ondansetron (ZOFRAN) IV  Assesment: She was admitted with abdominal pain and presumed intra-abdominal infection following a biopsy. She appeared to be mildly septic on admission and that has been treated and improved. Her  white blood count  peaked at 31,000 and is now down to 19,000. She has tolerated clear liquid diet. Active Problems:   Abdominal pain   Nausea   Abdominal mass   Pain following surgery or procedure    Plan: Continue current treatments. Continue IV antibiotics. GI consultation has been requested    LOS: 1 day   Columbia Pandey L 08/24/2015, 8:53 AM

## 2015-08-25 ENCOUNTER — Inpatient Hospital Stay (HOSPITAL_COMMUNITY): Payer: Medicare Other

## 2015-08-25 ENCOUNTER — Encounter: Payer: Self-pay | Admitting: Internal Medicine

## 2015-08-25 LAB — CBC WITH DIFFERENTIAL/PLATELET
Basophils Absolute: 0 10*3/uL (ref 0.0–0.1)
Basophils Relative: 0 %
Eosinophils Absolute: 0 10*3/uL (ref 0.0–0.7)
Eosinophils Relative: 0 %
HCT: 39.8 % (ref 36.0–46.0)
Hemoglobin: 13.5 g/dL (ref 12.0–15.0)
Lymphocytes Relative: 5 %
Lymphs Abs: 0.9 10*3/uL (ref 0.7–4.0)
MCH: 30.2 pg (ref 26.0–34.0)
MCHC: 33.9 g/dL (ref 30.0–36.0)
MCV: 89 fL (ref 78.0–100.0)
Monocytes Absolute: 1.9 10*3/uL — ABNORMAL HIGH (ref 0.1–1.0)
Monocytes Relative: 10 %
Neutro Abs: 15.9 10*3/uL — ABNORMAL HIGH (ref 1.7–7.7)
Neutrophils Relative %: 85 %
Platelets: 341 10*3/uL (ref 150–400)
RBC: 4.47 MIL/uL (ref 3.87–5.11)
RDW: 12.7 % (ref 11.5–15.5)
WBC: 18.8 10*3/uL — ABNORMAL HIGH (ref 4.0–10.5)

## 2015-08-25 LAB — COMPREHENSIVE METABOLIC PANEL
ALT: 12 U/L — ABNORMAL LOW (ref 14–54)
AST: 15 U/L (ref 15–41)
Albumin: 2.5 g/dL — ABNORMAL LOW (ref 3.5–5.0)
Alkaline Phosphatase: 99 U/L (ref 38–126)
Anion gap: 7 (ref 5–15)
BUN: 9 mg/dL (ref 6–20)
CO2: 26 mmol/L (ref 22–32)
Calcium: 8.8 mg/dL — ABNORMAL LOW (ref 8.9–10.3)
Chloride: 99 mmol/L — ABNORMAL LOW (ref 101–111)
Creatinine, Ser: 0.91 mg/dL (ref 0.44–1.00)
GFR calc Af Amer: >60 mL/min (ref >60)
GFR calc non Af Amer: >60 mL/min (ref >60)
Glucose, Bld: 113 mg/dL — ABNORMAL HIGH (ref 65–99)
Potassium: 3.4 mmol/L — ABNORMAL LOW (ref 3.5–5.1)
Sodium: 132 mmol/L — ABNORMAL LOW (ref 135–145)
Total Bilirubin: 0.5 mg/dL (ref 0.3–1.2)
Total Protein: 6 g/dL — ABNORMAL LOW (ref 6.5–8.1)

## 2015-08-25 LAB — PROTIME-INR
INR: 1.11 (ref 0.00–1.49)
Prothrombin Time: 14.5 seconds (ref 11.6–15.2)

## 2015-08-25 LAB — LIPASE, BLOOD: Lipase: 36 U/L (ref 11–51)

## 2015-08-25 MED ORDER — HYDROMORPHONE HCL 1 MG/ML IJ SOLN
1.0000 mg | Freq: Three times a day (TID) | INTRAMUSCULAR | Status: DC
  Administered 2015-08-25 – 2015-08-27 (×6): 1 mg via INTRAVENOUS
  Filled 2015-08-25 (×6): qty 1

## 2015-08-25 MED ORDER — IOHEXOL 350 MG/ML SOLN
100.0000 mL | Freq: Once | INTRAVENOUS | Status: AC | PRN
  Administered 2015-08-25: 100 mL via INTRAVENOUS

## 2015-08-25 MED ORDER — PROMETHAZINE HCL 25 MG/ML IJ SOLN
12.5000 mg | Freq: Four times a day (QID) | INTRAMUSCULAR | Status: DC | PRN
  Administered 2015-08-25 – 2015-08-26 (×3): 12.5 mg via INTRAVENOUS
  Filled 2015-08-25 (×4): qty 1

## 2015-08-25 NOTE — Progress Notes (Signed)
    Subjective: No pain overnight, now with recurrent pain, nausea. Tired of liquid diet. Afebrile.   Objective: Vital signs in last 24 hours: Temp:  [98.2 F (36.8 C)-98.4 F (36.9 C)] 98.4 F (36.9 C) (12/20 0615) Pulse Rate:  [73] 73 (12/20 0615) Resp:  [20] 20 (12/20 0615) BP: (133-151)/(70-71) 133/70 mmHg (12/20 0615) SpO2:  [95 %-96 %] 95 % (12/20 0615) Last BM Date: 08/22/15 General:   Alert and oriented, pleasant Head:  Normocephalic and atraumatic. Abdomen:  Bowel sounds present, soft, TTP upper abdomen,  non-distended, right ventral hernia Msk:  Symmetrical without gross deformities. Normal posture. Extremities:  Without  edema. Neurologic:  Alert and  oriented x4 Psych:  Alert and cooperative. Normal mood and affect.  Intake/Output from previous day: 12/19 0701 - 12/20 0700 In: 1640 [P.O.:240; I.V.:1400] Out: -  Intake/Output this shift:    Lab Results:  Recent Labs  08/23/15 0603 08/24/15 0553 08/25/15 0751  WBC 23.0* 19.0* 18.8*  HGB 12.8 12.5 13.5  HCT 38.4 37.6 39.8  PLT 326 319 341   BMET  Recent Labs  08/23/15 0603 08/24/15 0553  NA 136 133*  K 4.0 4.1  CL 105 102  CO2 24 26  GLUCOSE 108* 116*  BUN 16 11  CREATININE 0.93 0.82  CALCIUM 8.8* 8.9     Assessment: 59 year old female with history of NASH cirrhosis, prior bariatric surgery in remote past, admitted with recurrent upper abdominal pain, N/V after recent EUS (08/20/15) for mass at porta hepatis. Cytology negative for malignancy, and Dr. Ardis Hughs' suspicion for neoplasm is low. She has been referred to Dr. Barry Dienes for surgical consideration in January 2017. Upon admission, notable leukocytosis, which has been slowly improving. She has remained afebrile. It is felt she may have localized peritonitis from recent EUS/FNA. She continues with abdominal pain, nausea, and vomiting. As of note, CT reviewed with Dr. Ardeen Garland and negative for internal hernia. Agree with attending physician's decision  to order CTA to rule out any underlying mesenteric vasculature compromise, as she has had weight loss and intermittent abdominal pain.   As of note, EUS did show small esophageal varices but not completely evaluated as dedicated EGD not performed. Recommend surveillance EGD as outpatient.     Plan: ~Agree with CTA ~Continue scheduled Zofran ~Phenergan 12.5 mg IV every 6 hours prn nausea/vomiting, first dose now.     Discussed with nurse.  ~PPI BID ~Elective EGD as outpatient for further assessment of varices noted on EUS (last EGD in 2015 without varices) ~Continue Zosyn ~Check CMP, INR to calculate MELD score   Orvil Feil, ANP-BC [facility] Gastroenter[facility]     LOS: 2 days    08/25/2015, 10:22 AM

## 2015-08-25 NOTE — Progress Notes (Signed)
ANTIBIOTIC CONSULT NOTE   Pharmacy Consult for Zosyn Indication: Intra-abdominal infection  Allergies  Allergen Reactions  . Ciprofloxacin Rash    Patient Measurements: Height: 5\' 4"  (162.6 cm) Weight: 186 lb 4.6 oz (84.5 kg) IBW/kg (Calculated) : 54.7  Vital Signs: Temp: 98.4 F (36.9 C) (12/20 0615) Temp Source: Oral (12/20 0615) BP: 133/70 mmHg (12/20 0615) Pulse Rate: 73 (12/20 0615) Intake/Output from previous day: 12/19 0701 - 12/20 0700 In: 1640 [P.O.:240; I.V.:1400] Out: -  Intake/Output from this shift: Total I/O In: 120 [P.O.:120] Out: -   Labs:  Recent Labs  08/23/15 0603 08/24/15 0553 08/25/15 0751 08/25/15 1103  WBC 23.0* 19.0* 18.8*  --   HGB 12.8 12.5 13.5  --   PLT 326 319 341  --   CREATININE 0.93 0.82  --  0.91   Estimated Creatinine Clearance: 70 mL/min (by C-G formula based on Cr of 0.91). No results for input(s): VANCOTROUGH, VANCOPEAK, VANCORANDOM, GENTTROUGH, GENTPEAK, GENTRANDOM, TOBRATROUGH, TOBRAPEAK, TOBRARND, AMIKACINPEAK, AMIKACINTROU, AMIKACIN in the last 72 hours.   Microbiology: No results found for this or any previous visit (from the past 720 hour(s)).  Medical History: Past Medical History  Diagnosis Date  . Depression   . Chronic constipation   . Diverticulosis 01/31/2008    colonoscopy Dr Gala Romney  . Obesity   . Schizoaffective disorder     Mental Health-Dr Elias Else  . PUD (peptic ulcer disease) 1988  . Colonic inertia   . Hyperplastic colon polyp   . Diverticula of colon   . Cirrhosis ([place])     child pugh class A and MELD 5, completed hep A and B vaccine series, no HCC on 3/16 MRI    Medications:  Scheduled:  . buPROPion  150 mg Oral Daily  . enoxaparin (LOVENOX) injection  40 mg Subcutaneous Q24H  . FLUoxetine  40 mg Oral Daily  . ondansetron (ZOFRAN) IV  4 mg Intravenous TID AC & HS  . pantoprazole  40 mg Oral BID AC  . piperacillin-tazobactam (ZOSYN)  IV  3.375 g Intravenous Q8H  . risperiDONE  2 mg Oral QHS   . traZODone  300 mg Oral QHS   Assessment: 59 yo female with probable localized peritonitis from recent EUS/FNA. She is currently on zosyn. Afeb, WBC remain elevated  Goal of Therapy:  resolution of infection  Plan:  Cont zosyn 3.375gm IV q8h extended dosing interval Follow up culture results  Monitor V/S and labs  Thanks for allowing pharmacy to be a part of this patient's care.  Excell Seltzer, PharmD Clinical Pharmacist  08/25/2015,12:05 PM

## 2015-08-25 NOTE — Progress Notes (Signed)
Subjective: She continues to complain of abdominal pain. She still has nausea. She is not tolerating food well.  Objective: Vital signs in last 24 hours: Temp:  [98.2 F (36.8 C)-98.4 F (36.9 C)] 98.4 F (36.9 C) (12/20 0615) Pulse Rate:  [73] 73 (12/20 0615) Resp:  [20] 20 (12/20 0615) BP: (133-151)/(70-71) 133/70 mmHg (12/20 0615) SpO2:  [95 %-96 %] 95 % (12/20 0615) Weight change:  Last BM Date: 08/22/15  Intake/Output from previous day: 12/19 0701 - 12/20 0700 In: 1640 [P.O.:240; I.V.:1400] Out: -   PHYSICAL EXAM General appearance: alert, cooperative and moderate distress Resp: clear to auscultation bilaterally Cardio: regular rate and rhythm, S1, S2 normal, no murmur, click, rub or gallop GI: Mildly diffusely tender Extremities: extremities normal, atraumatic, no cyanosis or edema  Lab Results:  Results for orders placed or performed during the hospital encounter of 08/21/15 (from the past 48 hour(s))  CBC with Differential/Platelet     Status: Abnormal   Collection Time: 08/24/15  5:53 AM  Result Value Ref Range   WBC 19.0 (H) 4.0 - 10.5 K/uL   RBC 4.19 3.87 - 5.11 MIL/uL   Hemoglobin 12.5 12.0 - 15.0 g/dL   HCT 37.6 36.0 - 46.0 %   MCV 89.7 78.0 - 100.0 fL   MCH 29.8 26.0 - 34.0 pg   MCHC 33.2 30.0 - 36.0 g/dL   RDW 12.6 11.5 - 15.5 %   Platelets 319 150 - 400 K/uL   Neutrophils Relative % 88 %   Neutro Abs 16.8 (H) 1.7 - 7.7 K/uL   Lymphocytes Relative 4 %   Lymphs Abs 0.7 0.7 - 4.0 K/uL   Monocytes Relative 8 %   Monocytes Absolute 1.4 (H) 0.1 - 1.0 K/uL   Eosinophils Relative 0 %   Eosinophils Absolute 0.0 0.0 - 0.7 K/uL   Basophils Relative 0 %   Basophils Absolute 0.0 0.0 - 0.1 K/uL  Basic metabolic panel     Status: Abnormal   Collection Time: 08/24/15  5:53 AM  Result Value Ref Range   Sodium 133 (L) 135 - 145 mmol/L   Potassium 4.1 3.5 - 5.1 mmol/L   Chloride 102 101 - 111 mmol/L   CO2 26 22 - 32 mmol/L   Glucose, Bld 116 (H) 65 - 99 mg/dL    BUN 11 6 - 20 mg/dL   Creatinine, Ser 0.82 0.44 - 1.00 mg/dL   Calcium 8.9 8.9 - 10.3 mg/dL   GFR calc non Af Amer >60 >60 mL/min   GFR calc Af Amer >60 >60 mL/min    Comment: (NOTE) The eGFR has been calculated using the CKD EPI equation. This calculation has not been validated in all clinical situations. eGFR's persistently <60 mL/min signify possible Chronic Kidney Disease.    Anion gap 5 5 - 15  CBC with Differential/Platelet     Status: Abnormal   Collection Time: 08/25/15  7:51 AM  Result Value Ref Range   WBC 18.8 (H) 4.0 - 10.5 K/uL   RBC 4.47 3.87 - 5.11 MIL/uL   Hemoglobin 13.5 12.0 - 15.0 g/dL   HCT 39.8 36.0 - 46.0 %   MCV 89.0 78.0 - 100.0 fL   MCH 30.2 26.0 - 34.0 pg   MCHC 33.9 30.0 - 36.0 g/dL   RDW 12.7 11.5 - 15.5 %   Platelets 341 150 - 400 K/uL   Neutrophils Relative % 85 %   Neutro Abs 15.9 (H) 1.7 - 7.7 K/uL   Lymphocytes Relative  5 %   Lymphs Abs 0.9 0.7 - 4.0 K/uL   Monocytes Relative 10 %   Monocytes Absolute 1.9 (H) 0.1 - 1.0 K/uL   Eosinophils Relative 0 %   Eosinophils Absolute 0.0 0.0 - 0.7 K/uL   Basophils Relative 0 %   Basophils Absolute 0.0 0.0 - 0.1 K/uL    ABGS No results for input(s): PHART, PO2ART, TCO2, HCO3 in the last 72 hours.  Invalid input(s): PCO2 CULTURES No results found for this or any previous visit (from the past 240 hour(s)). Studies/Results: No results found.  Medications:  Prior to Admission:  Prescriptions prior to admission  Medication Sig Dispense Refill Last Dose  . buPROPion (WELLBUTRIN SR) 150 MG 12 hr tablet Take 300 mg by mouth daily.    08/21/2015 at Unknown time  . calcium-vitamin D (OSCAL WITH D) 500-200 MG-UNIT tablet Take 1 tablet by mouth 2 (two) times daily.   08/21/2015 at Unknown time  . FLUoxetine (PROZAC) 40 MG capsule Take 40 mg by mouth daily.    08/21/2015 at Unknown time  . LINZESS 290 MCG CAPS capsule TAKE ONE CAPSULE BY MOUTH DAILY 30 MINUTES BEFORE BREAKFAST. 30 capsule 11 08/21/2015  at Unknown time  . pantoprazole (PROTONIX) 40 MG tablet Take 1 tablet (40 mg total) by mouth daily. 30 tablet 12 08/21/2015 at Unknown time  . risperiDONE (RISPERDAL) 2 MG tablet Take 2 mg by mouth at bedtime.   08/21/2015 at Unknown time  . traZODone (DESYREL) 150 MG tablet Take 300 mg by mouth at bedtime.   08/21/2015 at Unknown time   Scheduled: . buPROPion  150 mg Oral Daily  . enoxaparin (LOVENOX) injection  40 mg Subcutaneous Q24H  . FLUoxetine  40 mg Oral Daily  . ondansetron (ZOFRAN) IV  4 mg Intravenous TID AC & HS  . pantoprazole  40 mg Oral BID AC  . piperacillin-tazobactam (ZOSYN)  IV  3.375 g Intravenous Q8H  . risperiDONE  2 mg Oral QHS  . traZODone  300 mg Oral QHS   Continuous: . 0.9 % NaCl with KCl 20 mEq / L 75 mL/hr at 08/24/15 1606   ION:[id number] (DILAUDID) injection, ondansetron **OR** ondansetron (ZOFRAN) IV, promethazine  Assesment: She has had abdominal pain nausea and has an abdominal mass. She has what looks like some peritonitis. She has lost weight so I'm going to have her get a CT angiogram to be sure if she has intestinal angina Active Problems:   Abdominal pain   Nausea   Abdominal mass   Pain following surgery or procedure    Plan: CT angiogram today. Continue other treatments    LOS: 2 days   [name] L 08/25/2015, 9:06 AM

## 2015-08-25 NOTE — Progress Notes (Signed)
MELD score 8. CTA completed. Celiac, SMA, and IMA are all patent. Peripancreatic/mesenteric fluid inflammatory changes, correlate for superimposed pancreatitis. Likely postprocedural. Admitting lipase normal. New gas collection along the jejunal mesentery and scattered ares of retroperitoneal gas likely secondary to recent EUS. No obvious abscess. Consider  repeat CT in about a week for interval assessment.  Orvil Feil, ANP-BC [facility] Gastroenterology

## 2015-08-26 DIAGNOSIS — G8918 Other acute postprocedural pain: Secondary | ICD-10-CM

## 2015-08-26 LAB — CBC WITH DIFFERENTIAL/PLATELET
Basophils Absolute: 0 10*3/uL (ref 0.0–0.1)
Basophils Relative: 0 %
Eosinophils Absolute: 0.1 10*3/uL (ref 0.0–0.7)
Eosinophils Relative: 1 %
HCT: 39 % (ref 36.0–46.0)
Hemoglobin: 13.2 g/dL (ref 12.0–15.0)
Lymphocytes Relative: 5 %
Lymphs Abs: 1.1 10*3/uL (ref 0.7–4.0)
MCH: 30.2 pg (ref 26.0–34.0)
MCHC: 33.8 g/dL (ref 30.0–36.0)
MCV: 89.2 fL (ref 78.0–100.0)
Monocytes Absolute: 2.6 10*3/uL — ABNORMAL HIGH (ref 0.1–1.0)
Monocytes Relative: 12 %
Neutro Abs: 18 10*3/uL — ABNORMAL HIGH (ref 1.7–7.7)
Neutrophils Relative %: 82 %
Platelets: 363 10*3/uL (ref 150–400)
RBC: 4.37 MIL/uL (ref 3.87–5.11)
RDW: 13 % (ref 11.5–15.5)
WBC: 21.8 10*3/uL — ABNORMAL HIGH (ref 4.0–10.5)

## 2015-08-26 MED ORDER — POTASSIUM CHLORIDE CRYS ER 20 MEQ PO TBCR
40.0000 meq | EXTENDED_RELEASE_TABLET | Freq: Once | ORAL | Status: AC
  Administered 2015-08-26: 40 meq via ORAL
  Filled 2015-08-26: qty 2

## 2015-08-26 MED ORDER — AMOXICILLIN-POT CLAVULANATE 875-125 MG PO TABS
1.0000 | ORAL_TABLET | Freq: Two times a day (BID) | ORAL | Status: DC
  Administered 2015-08-26 – 2015-08-27 (×3): 1 via ORAL
  Filled 2015-08-26 (×3): qty 1

## 2015-08-26 NOTE — Care Management Note (Signed)
Case Management Note  Patient Details  Name: MALLARIE GLOGOWSKI MRN: [id number] Date of Birth: 1956/01/04  Subjective/Objective:                  Pt is from home, lives with children and is ind with ADL's. Pt admitted for pain control following surgery. No HH services or DME's prior to admission.   Action/Plan: Pt plans to return home with self care at DC.   Expected Discharge Date:  08/24/15               Expected Discharge Plan:  Home/Self Care  In-House Referral:  NA  Discharge planning Services  CM Consult  Post Acute Care Choice:  NA Choice offered to:  NA  DME Arranged:    DME Agency:     HH Arranged:    HH Agency:     Status of Service:  Completed, signed off  Medicare Important Message Given:    Date Medicare IM Given:    Medicare IM give by:    Date Additional Medicare IM Given:    Additional Medicare Important Message give by:     If discussed at [place] of Stay Meetings, dates discussed:    Additional Comments:  Sherald Barge, RN 08/26/2015, 11:12 AM

## 2015-08-26 NOTE — Progress Notes (Signed)
Subjective: She says she feels better. She has less abdominal pain and less nausea. She has tolerated full liquid diet and wants more to eat. Her white blood count has gone up despite her clinical improvement  Objective: Vital signs in last 24 hours: Temp:  [98.2 F (36.8 C)-98.4 F (36.9 C)] 98.4 F (36.9 C) (12/21 0540) Pulse Rate:  [76-88] 76 (12/21 0540) Resp:  [20] 20 (12/21 0540) BP: (122-138)/(66-74) 136/74 mmHg (12/21 0540) SpO2:  [93 %-96 %] 95 % (12/21 0540) Weight change:  Last BM Date: 08/22/15  Intake/Output from previous day: 12/20 0701 - 12/21 0700 In: 1486.3 [P.O.:240; I.V.:1146.3; IV Piggyback:100] Out: -   PHYSICAL EXAM General appearance: alert, cooperative and mild distress Resp: clear to auscultation bilaterally Cardio: regular rate and rhythm, S1, S2 normal, no murmur, click, rub or gallop GI: She still has some abdominal tenderness Extremities: extremities normal, atraumatic, no cyanosis or edema  Lab Results:  Results for orders placed or performed during the hospital encounter of 08/21/15 (from the past 48 hour(s))  CBC with Differential/Platelet     Status: Abnormal   Collection Time: 08/25/15  7:51 AM  Result Value Ref Range   WBC 18.8 (H) 4.0 - 10.5 K/uL   RBC 4.47 3.87 - 5.11 MIL/uL   Hemoglobin 13.5 12.0 - 15.0 g/dL   HCT 39.8 36.0 - 46.0 %   MCV 89.0 78.0 - 100.0 fL   MCH 30.2 26.0 - 34.0 pg   MCHC 33.9 30.0 - 36.0 g/dL   RDW 12.7 11.5 - 15.5 %   Platelets 341 150 - 400 K/uL   Neutrophils Relative % 85 %   Neutro Abs 15.9 (H) 1.7 - 7.7 K/uL   Lymphocytes Relative 5 %   Lymphs Abs 0.9 0.7 - 4.0 K/uL   Monocytes Relative 10 %   Monocytes Absolute 1.9 (H) 0.1 - 1.0 K/uL   Eosinophils Relative 0 %   Eosinophils Absolute 0.0 0.0 - 0.7 K/uL   Basophils Relative 0 %   Basophils Absolute 0.0 0.0 - 0.1 K/uL  Lipase, blood     Status: None   Collection Time: 08/25/15  7:51 AM  Result Value Ref Range   Lipase 36 11 - 51 U/L  Comprehensive  metabolic panel     Status: Abnormal   Collection Time: 08/25/15 11:03 AM  Result Value Ref Range   Sodium 132 (L) 135 - 145 mmol/L   Potassium 3.4 (L) 3.5 - 5.1 mmol/L    Comment: DELTA CHECK NOTED   Chloride 99 (L) 101 - 111 mmol/L   CO2 26 22 - 32 mmol/L   Glucose, Bld 113 (H) 65 - 99 mg/dL   BUN 9 6 - 20 mg/dL   Creatinine, Ser 0.91 0.44 - 1.00 mg/dL   Calcium 8.8 (L) 8.9 - 10.3 mg/dL   Total Protein 6.0 (L) 6.5 - 8.1 g/dL   Albumin 2.5 (L) 3.5 - 5.0 g/dL   AST 15 15 - 41 U/L   ALT 12 (L) 14 - 54 U/L   Alkaline Phosphatase 99 38 - 126 U/L   Total Bilirubin 0.5 0.3 - 1.2 mg/dL   GFR calc non Af Amer >60 >60 mL/min   GFR calc Af Amer >60 >60 mL/min    Comment: (NOTE) The eGFR has been calculated using the CKD EPI equation. This calculation has not been validated in all clinical situations. eGFR's persistently <60 mL/min signify possible Chronic Kidney Disease.    Anion gap 7 5 - 15  Protime-INR     Status: None   Collection Time: 08/25/15 11:03 AM  Result Value Ref Range   Prothrombin Time 14.5 11.6 - 15.2 seconds   INR 1.11 0.00 - 1.49  CBC with Differential/Platelet     Status: Abnormal   Collection Time: 08/26/15  5:49 AM  Result Value Ref Range   WBC 21.8 (H) 4.0 - 10.5 K/uL   RBC 4.37 3.87 - 5.11 MIL/uL   Hemoglobin 13.2 12.0 - 15.0 g/dL   HCT 39.0 36.0 - 46.0 %   MCV 89.2 78.0 - 100.0 fL   MCH 30.2 26.0 - 34.0 pg   MCHC 33.8 30.0 - 36.0 g/dL   RDW 13.0 11.5 - 15.5 %   Platelets 363 150 - 400 K/uL   Neutrophils Relative % 82 %   Neutro Abs 18.0 (H) 1.7 - 7.7 K/uL   Lymphocytes Relative 5 %   Lymphs Abs 1.1 0.7 - 4.0 K/uL   Monocytes Relative 12 %   Monocytes Absolute 2.6 (H) 0.1 - 1.0 K/uL   Eosinophils Relative 1 %   Eosinophils Absolute 0.1 0.0 - 0.7 K/uL   Basophils Relative 0 %   Basophils Absolute 0.0 0.0 - 0.1 K/uL    ABGS No results for input(s): PHART, PO2ART, TCO2, HCO3 in the last 72 hours.  Invalid input(s): PCO2 CULTURES No results found  for this or any previous visit (from the past 240 hour(s)). Studies/Results: Ct Angio Abd/pel W/ And/or W/o  08/25/2015  CLINICAL DATA:  Nausea, abdominal pain, weight loss since 07/29/2015. Retroperitoneal air on recent CT, presumed secondary to endoscopic biopsy of lesion in the porta hepatis. History of diverticulosis, colon polyps, cirrhosis, hysterectomy, gastric bypass and resection, and cholecystectomy. EXAM: CTA ABDOMEN AND PELVIS WITHOUT AND WITH CONTRAST TECHNIQUE: Multidetector CT imaging of the abdomen and pelvis was performed using the standard protocol during bolus administration of intravenous contrast. Multiplanar reconstructed images and MIPs were obtained and reviewed to evaluate the vascular anatomy. CONTRAST:  128m OMNIPAQUE IOHEXOL 350 MG/ML SOLN COMPARISON:  CT abdomen pelvis dated 08/22/2015 FINDINGS: No evidence of abdominal aortic aneurysm or dissection. Celiac artery, SMA, and IMA remain patent. Atherosclerotic calcifications of the abdominal aorta and branch vessels. Portal vein is extrinsically is narrowed but patent. SMV is patent. Splenic vein is patent. Lower chest: Mild patchy opacity in the right lower lobe, likely atelectasis. Trace right pleural effusion. Hepatobiliary: Liver is within normal limits. Status post cholecystectomy. No intrahepatic or extrahepatic ductal dilatation. Pancreas: Extrinsic compression of the pancreas by a 5.2 x 8.1 cm low-density fluid and gas containing lesion, likely reflecting a postop seroma or pseudocyst, now with gas following recent biopsy. Adjacent retroperitoneal gas (series 9/ images 26, 35, and 37) with an ill-defined 3.5 x 4.8 cm gas collection in the jejunal mesentery (series 9/ image 38), increased. Surrounding fluid/ inflammatory changes, possibly postprocedural. Superimposed pancreatitis is not entirely excluded. Spleen: Within normal limits. Adrenals/Urinary Tract: Adrenal glands are within normal limits. Kidneys are within normal  limits.  No hydronephrosis. Bladder is underdistended but unremarkable. Stomach/Bowel: Stomach is notable for postsurgical changes related to gastric bypass. No evidence of bowel obstruction. Normal appendix (series 9/image 68). Stable right lateral ventral hernia containing a loop of proximal transverse colon (series 9/ image 39). Wall thickening involving mid transverse colon (series 9/ image 55), favored to reflect secondary inflammatory changes. Vascular/Lymphatic: Vascular findings as above. 11 mm short axis portacaval node (series 9/ image 31), likely reactive. Additional small retroperitoneal/ periportal nodes measuring up to  9 mm short axis (series 9/ image 47). Reproductive: Status post hysterectomy. Bilateral ovaries are within normal limits. Other: Peripancreatic/periduodenal inflammatory changes, as above. No free air. Musculoskeletal: Degenerative changes of the visualized thoracolumbar spine. Review of the MIP images confirms the above findings. IMPRESSION: 5.2 x 8.1 cm fluid and gas containing lesion adjacent to the pancreas, favored to reflect a postoperative seroma or pseudocyst, now with gas following recent biopsy. New 3.5 x 4.8 cm gas collection along the jejunal mesentery and additional scattered foci of localized retroperitoneal gas, likely related to recent procedure. No well-defined abscess at the current time. Follow-up CT is suggested in 5-7 days to ensure resolution (or earlier as clinically warranted). No free air. Surrounding peripancreatic/mesenteric fluid inflammatory changes. Correlate for superimposed pancreatitis. No evidence of abdominal aortic aneurysm or dissection. Celiac artery, SMA, and IMA remain patent. Portal vein is narrowed but patent. SMV and splenic vein are patent. Electronically Signed   By: Julian Hy M.D.   On: 08/25/2015 12:25    Medications:  Prior to Admission:  Prescriptions prior to admission  Medication Sig Dispense Refill Last Dose  . buPROPion  (WELLBUTRIN SR) 150 MG 12 hr tablet Take 300 mg by mouth daily.    08/21/2015 at Unknown time  . calcium-vitamin D (OSCAL WITH D) 500-200 MG-UNIT tablet Take 1 tablet by mouth 2 (two) times daily.   08/21/2015 at Unknown time  . FLUoxetine (PROZAC) 40 MG capsule Take 40 mg by mouth daily.    08/21/2015 at Unknown time  . LINZESS 290 MCG CAPS capsule TAKE ONE CAPSULE BY MOUTH DAILY 30 MINUTES BEFORE BREAKFAST. 30 capsule 11 08/21/2015 at Unknown time  . pantoprazole (PROTONIX) 40 MG tablet Take 1 tablet (40 mg total) by mouth daily. 30 tablet 12 08/21/2015 at Unknown time  . risperiDONE (RISPERDAL) 2 MG tablet Take 2 mg by mouth at bedtime.   08/21/2015 at Unknown time  . traZODone (DESYREL) 150 MG tablet Take 300 mg by mouth at bedtime.   08/21/2015 at Unknown time   Scheduled: . buPROPion  150 mg Oral Daily  . enoxaparin (LOVENOX) injection  40 mg Subcutaneous Q24H  . FLUoxetine  40 mg Oral Daily  .  HYDROmorphone (DILAUDID) injection  1 mg Intravenous 3 times per day  . ondansetron (ZOFRAN) IV  4 mg Intravenous TID AC & HS  . pantoprazole  40 mg Oral BID AC  . piperacillin-tazobactam (ZOSYN)  IV  3.375 g Intravenous Q8H  . risperiDONE  2 mg Oral QHS  . traZODone  300 mg Oral QHS   Continuous: . 0.9 % NaCl with KCl 20 mEq / L 1,000 mL (08/25/15 1156)   XBM:[id number] (DILAUDID) injection, ondansetron **OR** ondansetron (ZOFRAN) IV, promethazine  Assesment: She came in with abdominal pain nausea and vomiting. She is felt to have localized peritonitis from endoscopic ultrasound biopsy. She is improving. Active Problems:   Abdominal pain   Nausea   Abdominal mass   Pain following surgery or procedure    Plan: Advance her diet. Switch her to oral antibiotics. If she does well she will be discharged home tomorrow    LOS: 3 days   , L 08/26/2015, 9:03 AM

## 2015-08-26 NOTE — Progress Notes (Signed)
Subjective:  Tolerating liquids. Overall appetite poor. Abdominal pain somewhat improved. "25% better".   Objective: Vital signs in last 24 hours: Temp:  [98.2 F (36.8 C)-98.4 F (36.9 C)] 98.4 F (36.9 C) (12/21 0540) Pulse Rate:  [76-88] 76 (12/21 0540) Resp:  [20] 20 (12/21 0540) BP: (122-138)/(66-74) 136/74 mmHg (12/21 0540) SpO2:  [93 %-96 %] 95 % (12/21 0540) Last BM Date: 08/22/15 General:   Alert,  Well-developed, well-nourished, pleasant and cooperative in NAD Head:  Normocephalic and atraumatic. Eyes:  Sclera clear, no icterus.  Abdomen:  Soft, mild epigastric tenderness and nondistended.  Normal bowel sounds, without guarding, and without rebound.   Extremities:  Without clubbing, deformity or edema. Neurologic:  Alert and  oriented x4;  grossly normal neurologically. Skin:  Intact without significant lesions or rashes. Psych:  Alert and cooperative. Normal mood and affect.  Intake/Output from previous day: 12/20 0701 - 12/21 0700 In: 1486.3 [P.O.:240; I.V.:1146.3; IV Piggyback:100] Out: -  Intake/Output this shift:    Lab Results: CBC  Recent Labs  08/24/15 0553 08/25/15 0751 08/26/15 0549  WBC 19.0* 18.8* 21.8*  HGB 12.5 13.5 13.2  HCT 37.6 39.8 39.0  MCV 89.7 89.0 89.2  PLT 319 341 363   BMET  Recent Labs  08/24/15 0553 08/25/15 1103  NA 133* 132*  K 4.1 3.4*  CL 102 99*  CO2 26 26  GLUCOSE 116* 113*  BUN 11 9  CREATININE 0.82 0.91  CALCIUM 8.9 8.8*   LFTs  Recent Labs  08/25/15 1103  BILITOT 0.5  ALKPHOS 99  AST 15  ALT 12*  PROT 6.0*  ALBUMIN 2.5*    Recent Labs  08/25/15 0751  LIPASE 36   PT/INR  Recent Labs  08/25/15 1103  LABPROT 14.5  INR 1.11      Imaging Studies: Ct Abdomen Pelvis W Contrast  08/22/2015  CLINICAL DATA:  Sharp umbilical abdominal pain and nausea, onset tonight. Abnormal x-ray. EXAM: CT ABDOMEN AND PELVIS WITH CONTRAST TECHNIQUE: Multidetector CT imaging of the abdomen and pelvis was  performed using the standard protocol following bolus administration of intravenous contrast. CONTRAST:  39mL OMNIPAQUE IOHEXOL 300 MG/ML SOLN, 112mL OMNIPAQUE IOHEXOL 300 MG/ML SOLN COMPARISON:  Abdominal radiographs 1 day prior.  CT 07/30/2015 FINDINGS: Lower chest:  The included lung bases are clear. Liver: No focal hepatic lesion. Low-attenuation mass at the porta hepatis measures 6.1 x 4.6 cm, previously 5.8 x 4.9 cm, some internal and very lesional air. Air tracks along the portal vein and in the right retroperitoneum. Hepatobiliary: Postcholecystectomy with clips in the gallbladder fossa. Pancreas: No ductal dilatation. Small amount of fluid adjacent to the pancreatic head and in the retroperitoneum. Spleen: Normal in size and density. Adrenal glands: No nodule. Kidneys: Symmetric renal enhancement and excretion. No hydronephrosis. No perinephric stranding. Stomach/Bowel: Patient is post gastric bypass. High-density material in the excluded gastric lumen is unchanged. There is periduodenal soft tissue stranding and small amount of free fluid. There are no dilated or thickened small bowel loops. Small volume of stool throughout the colon without colonic wall thickening. Right upper ventral abdominal wall hernia contains normal appearing loop of colon. Submucosal fatty infiltration of the ascending colon, unchanged. The appendix is normal. Vascular/Lymphatic: No retroperitoneal adenopathy. Abdominal aorta is normal in caliber. Moderate atherosclerosis without aneurysm. Reproductive: Uterus surgically absent.  No adnexal mass. Bladder: Physiologically distended. Other: Retroperitoneal air in the right upper quadrant of the abdomen. Air appears adjacent to the mass in the porta hepatis. Musculoskeletal: There  are no acute or suspicious osseous abnormalities. IMPRESSION: 1. Retroperitoneal air in the right upper quadrant, likely sequela of recent endoscopic biopsy of porta hepatis mass. Air appears centered about  the mass, tracking about the porta hepatis and right retroperitoneum. Small amount of adjacent retroperitoneal free fluid. No confluent fluid collection or abscess. 2. Right lateral ventral abdominal wall hernia containing normal appearing loops of colon, no obstruction or inflammation. These results were called by telephone at the time of interpretation on 08/22/2015 at 2:09 am to Dr. Thayer Jew , who verbally acknowledged these results. Electronically Signed   By: Jeb Levering M.D.   On: 08/22/2015 02:12   Ct Abdomen Pelvis W Contrast  07/30/2015  CLINICAL DATA:  Epigastric pain and nausea and vomiting for 3 days. Previous gastric bypass surgery. EXAM: CT ABDOMEN AND PELVIS WITH CONTRAST TECHNIQUE: Multidetector CT imaging of the abdomen and pelvis was performed using the standard protocol following bolus administration of intravenous contrast. CONTRAST:  23mL OMNIPAQUE IOHEXOL 300 MG/ML  SOLN COMPARISON:  08/12/2013. FINDINGS: Lower chest:  No acute findings. Hepatobiliary: No liver masses identified. Prior cholecystectomy noted. No evidence of a biliary ductal dilatation. A homogeneous low-attenuation masses seen in the porta hepatis which measures 4.9 x 5.8 cm on image 24/series 2 compared to 4.5 x 5.6 cm previously. This abuts the gastric antrum and pancreatic neck. Pancreas: No mass, inflammatory changes, or other significant abnormality. Spleen: Within normal limits in size and appearance. Adrenals/Urinary Tract: No masses identified. No evidence of hydronephrosis. Stomach/Bowel: Postop changes from gastric bypass surgery again noted. No evidence of bowel obstruction normal appendix visualized. Vascular/Lymphatic: No pathologically enlarged lymph nodes. No evidence of abdominal aortic aneurysm. Reproductive: Prior hysterectomy noted. Adnexal regions are unremarkable in appearance. Other: None. Musculoskeletal:  No suspicious bone lesions identified. IMPRESSION: No acute findings identified  within the abdomen or pelvis. 5.8 cm mass in the porta hepatis abutting the gastric antrum and pancreas, which is mildly increased in size since previous studies. Differential diagnosis includes GI stromal tumor, leiomyoma, lymphoma, and atypical pancreatic neoplasm such as islet cell tumor. Electronically Signed   By: Earle Gell M.D.   On: 07/30/2015 17:12   Dg Abd Acute W/chest  08/22/2015  CLINICAL DATA:  Upper abdominal pain, nausea, and vomiting for 4 hours. Productive cough. Recent procedure. EXAM: DG ABDOMEN ACUTE W/ 1V CHEST COMPARISON:  08/01/2015 FINDINGS: Normal heart size and pulmonary vascularity. No focal airspace disease or consolidation in the lungs. No blunting of costophrenic angles. No pneumothorax. Mediastinal contours appear intact. Scattered gas and stool in the colon. No small or large bowel distention. Linear gas collections along the psoas margins likely representing retroperitoneal air. No free intra-abdominal air. No abnormal air-fluid levels. No radiopaque stones. Surgical clips in the upper abdomen. Degenerative changes in the lumbar spine. Calcified phleboliths in the pelvis. IMPRESSION: No evidence of active pulmonary disease. Suggestion of retroperitoneal gas in the abdomen. Duodenum perforation could have this appearance. Suggest CT for further evaluation. Nonobstructive bowel gas pattern. These results were called by telephone at the time of interpretation on 08/22/2015 at 12:22 am to Dr. Thayer Jew , who verbally acknowledged these results. Electronically Signed   By: Lucienne Capers M.D.   On: 08/22/2015 00:25   Dg Abd Acute W/chest  08/01/2015  CLINICAL DATA:  Nausea, vomiting and diarrhea since 07/28/2015. Initial encounter. EXAM: DG ABDOMEN ACUTE W/ 1V CHEST COMPARISON:  CT abdomen and pelvis 07/30/2015. FINDINGS: Single view of the chest demonstrates clear lungs and normal heart  size. No pneumothorax or pleural effusion. Aortic atherosclerosis is noted. Two  views of the abdomen show no free intraperitoneal air. Multiple surgical clips are seen in the left upper quadrant of the abdomen and there are sutures and clips in the right upper quadrant. The bowel gas pattern is nonobstructive. There is mild convex left lumbar scoliosis. IMPRESSION: No acute finding chest or abdomen. Electronically Signed   By: Inge Rise M.D.   On: 08/01/2015 09:05   Ct Angio Abd/pel W/ And/or W/o  08/25/2015  CLINICAL DATA:  Nausea, abdominal pain, weight loss since 07/29/2015. Retroperitoneal air on recent CT, presumed secondary to endoscopic biopsy of lesion in the porta hepatis. History of diverticulosis, colon polyps, cirrhosis, hysterectomy, gastric bypass and resection, and cholecystectomy. EXAM: CTA ABDOMEN AND PELVIS WITHOUT AND WITH CONTRAST TECHNIQUE: Multidetector CT imaging of the abdomen and pelvis was performed using the standard protocol during bolus administration of intravenous contrast. Multiplanar reconstructed images and MIPs were obtained and reviewed to evaluate the vascular anatomy. CONTRAST:  135mL OMNIPAQUE IOHEXOL 350 MG/ML SOLN COMPARISON:  CT abdomen pelvis dated 08/22/2015 FINDINGS: No evidence of abdominal aortic aneurysm or dissection. Celiac artery, SMA, and IMA remain patent. Atherosclerotic calcifications of the abdominal aorta and branch vessels. Portal vein is extrinsically is narrowed but patent. SMV is patent. Splenic vein is patent. Lower chest: Mild patchy opacity in the right lower lobe, likely atelectasis. Trace right pleural effusion. Hepatobiliary: Liver is within normal limits. Status post cholecystectomy. No intrahepatic or extrahepatic ductal dilatation. Pancreas: Extrinsic compression of the pancreas by a 5.2 x 8.1 cm low-density fluid and gas containing lesion, likely reflecting a postop seroma or pseudocyst, now with gas following recent biopsy. Adjacent retroperitoneal gas (series 9/ images 26, 35, and 37) with an ill-defined 3.5 x  4.8 cm gas collection in the jejunal mesentery (series 9/ image 38), increased. Surrounding fluid/ inflammatory changes, possibly postprocedural. Superimposed pancreatitis is not entirely excluded. Spleen: Within normal limits. Adrenals/Urinary Tract: Adrenal glands are within normal limits. Kidneys are within normal limits.  No hydronephrosis. Bladder is underdistended but unremarkable. Stomach/Bowel: Stomach is notable for postsurgical changes related to gastric bypass. No evidence of bowel obstruction. Normal appendix (series 9/image 68). Stable right lateral ventral hernia containing a loop of proximal transverse colon (series 9/ image 39). Wall thickening involving mid transverse colon (series 9/ image 55), favored to reflect secondary inflammatory changes. Vascular/Lymphatic: Vascular findings as above. 11 mm short axis portacaval node (series 9/ image 31), likely reactive. Additional small retroperitoneal/ periportal nodes measuring up to 9 mm short axis (series 9/ image 47). Reproductive: Status post hysterectomy. Bilateral ovaries are within normal limits. Other: Peripancreatic/periduodenal inflammatory changes, as above. No free air. Musculoskeletal: Degenerative changes of the visualized thoracolumbar spine. Review of the MIP images confirms the above findings. IMPRESSION: 5.2 x 8.1 cm fluid and gas containing lesion adjacent to the pancreas, favored to reflect a postoperative seroma or pseudocyst, now with gas following recent biopsy. New 3.5 x 4.8 cm gas collection along the jejunal mesentery and additional scattered foci of localized retroperitoneal gas, likely related to recent procedure. No well-defined abscess at the current time. Follow-up CT is suggested in 5-7 days to ensure resolution (or earlier as clinically warranted). No free air. Surrounding peripancreatic/mesenteric fluid inflammatory changes. Correlate for superimposed pancreatitis. No evidence of abdominal aortic aneurysm or dissection.  Celiac artery, SMA, and IMA remain patent. Portal vein is narrowed but patent. SMV and splenic vein are patent. Electronically Signed   By: Julian Hy  M.D.   On: 08/25/2015 12:25  [2 weeks]   Assessment: 59 year old female with history of NASH cirrhosis, prior bariatric surgery in remote past, admitted with recurrent upper abdominal pain, N/V after recent EUS (08/20/15) for mass at porta hepatis. Cytology negative for malignancy, and Dr. Ardis Hughs' suspicion for neoplasm is low. She has been referred to Dr. Barry Dienes for surgical consideration in January 2017. Upon admission, notable leukocytosis, which has improved overall but slightly up the last 24 hours. She has remained afebrile. It is felt she may have localized peritonitis from recent EUS/FNA. Abdominal pain 25% better. Vomiting resolving. As of note, CT reviewed with Dr. Ardeen Garland and negative for internal hernia.   CTA findings.  5.2 x 8.1 cm fluid and gas containing lesion adjacent to the pancreas, favored to reflect a postoperative seroma or pseudocyst, now with gas following recent biopsy.  New 3.5 x 4.8 cm gas collection along the jejunal mesentery and additional scattered foci of localized retroperitoneal gas, likely related to recent procedure. No well-defined abscess at the current time. Follow-up CT is suggested in 5-7 days to ensure resolution (or earlier as clinically warranted). No free air.  Surrounding peripancreatic/mesenteric fluid inflammatory changes. Correlate for superimposed pancreatitis.  As of note, EUS did show small esophageal varices but not completely evaluated as dedicated EGD not performed. Recommend surveillance EGD as outpatient.   Plan: 1. Elective EGD as outpatient for further assessment of varices noted on EUS (last EGD in 2015 without varices). 2. Agree with transitioning to oral meds.  3. PPI BID. 4. Continue supportive measures.  5. Consider repeat CT in 5-7 days to ensure resolution.  6. CBC  in am.  Laureen Ochs. Bernarda Caffey [facility] [phone] 12/21/20169:53 AM     LOS: 3 days

## 2015-08-27 ENCOUNTER — Telehealth: Payer: Self-pay | Admitting: Gastroenterology

## 2015-08-27 DIAGNOSIS — K659 Peritonitis, unspecified: Principal | ICD-10-CM | POA: Diagnosis present

## 2015-08-27 LAB — CBC WITH DIFFERENTIAL/PLATELET
Basophils Absolute: 0 10*3/uL (ref 0.0–0.1)
Basophils Relative: 0 %
Eosinophils Absolute: 0.2 10*3/uL (ref 0.0–0.7)
Eosinophils Relative: 1 %
HCT: 35.4 % — ABNORMAL LOW (ref 36.0–46.0)
Hemoglobin: 12 g/dL (ref 12.0–15.0)
Lymphocytes Relative: 9 %
Lymphs Abs: 1.7 10*3/uL (ref 0.7–4.0)
MCH: 30.1 pg (ref 26.0–34.0)
MCHC: 33.9 g/dL (ref 30.0–36.0)
MCV: 88.7 fL (ref 78.0–100.0)
Monocytes Absolute: 2.2 10*3/uL — ABNORMAL HIGH (ref 0.1–1.0)
Monocytes Relative: 11 %
Neutro Abs: 15.2 10*3/uL — ABNORMAL HIGH (ref 1.7–7.7)
Neutrophils Relative %: 79 %
Platelets: 363 10*3/uL (ref 150–400)
RBC: 3.99 MIL/uL (ref 3.87–5.11)
RDW: 13 % (ref 11.5–15.5)
WBC: 19.3 10*3/uL — ABNORMAL HIGH (ref 4.0–10.5)

## 2015-08-27 MED ORDER — AMOXICILLIN-POT CLAVULANATE 875-125 MG PO TABS: 1.0000 | ORAL_TABLET | Freq: Two times a day (BID) | ORAL | Status: DC

## 2015-08-27 NOTE — Progress Notes (Signed)
Subjective:  Slept well. Feels better. Tolerated diet yesterday evening. Abdominal pain improving.  Objective: Vital signs in last 24 hours: Temp:  [97.9 F (36.6 C)-99.5 F (37.5 C)] 98.1 F (36.7 C) (12/22 0626) Pulse Rate:  [65-82] 65 (12/22 0626) Resp:  [18-20] 20 (12/22 0626) BP: (120-138)/(42-73) 138/73 mmHg (12/22 0626) SpO2:  [94 %-98 %] 98 % (12/22 0626) Last BM Date: 08/22/15 General:   Alert,  Well-developed, well-nourished, pleasant and cooperative in NAD Head:  Normocephalic and atraumatic. Eyes:  Sclera clear, no icterus.  Abdomen:  Soft, minimal epigastric tenderness and nondistended.Normal bowel sounds, without guarding, and without rebound.   Extremities:  Without clubbing, deformity or edema. Neurologic:  Alert and  oriented x4;  grossly normal neurologically. Skin:  Intact without significant lesions or rashes. Psych:  Alert and cooperative. Normal mood and affect.  Intake/Output from previous day: 12/21 0701 - 12/22 0700 In: 240 [P.O.:240] Out: -  Intake/Output this shift:    Lab Results: CBC  Recent Labs  08/25/15 0751 08/26/15 0549 08/27/15 0634  WBC 18.8* 21.8* 19.3*  HGB 13.5 13.2 12.0  HCT 39.8 39.0 35.4*  MCV 89.0 89.2 88.7  PLT 341 363 363   BMET  Recent Labs  08/25/15 1103  NA 132*  K 3.4*  CL 99*  CO2 26  GLUCOSE 113*  BUN 9  CREATININE 0.91  CALCIUM 8.8*   LFTs  Recent Labs  08/25/15 1103  BILITOT 0.5  ALKPHOS 99  AST 15  ALT 12*  PROT 6.0*  ALBUMIN 2.5*    Recent Labs  08/25/15 0751  LIPASE 36   PT/INR  Recent Labs  08/25/15 1103  LABPROT 14.5  INR 1.11      Imaging Studies: Ct Abdomen Pelvis W Contrast  08/22/2015  CLINICAL DATA:  Sharp umbilical abdominal pain and nausea, onset tonight. Abnormal x-ray. EXAM: CT ABDOMEN AND PELVIS WITH CONTRAST TECHNIQUE: Multidetector CT imaging of the abdomen and pelvis was performed using the standard protocol following bolus administration of intravenous  contrast. CONTRAST:  88mL OMNIPAQUE IOHEXOL 300 MG/ML SOLN, 143mL OMNIPAQUE IOHEXOL 300 MG/ML SOLN COMPARISON:  Abdominal radiographs 1 day prior.  CT 07/30/2015 FINDINGS: Lower chest:  The included lung bases are clear. Liver: No focal hepatic lesion. Low-attenuation mass at the porta hepatis measures 6.1 x 4.6 cm, previously 5.8 x 4.9 cm, some internal and very lesional air. Air tracks along the portal vein and in the right retroperitoneum. Hepatobiliary: Postcholecystectomy with clips in the gallbladder fossa. Pancreas: No ductal dilatation. Small amount of fluid adjacent to the pancreatic head and in the retroperitoneum. Spleen: Normal in size and density. Adrenal glands: No nodule. Kidneys: Symmetric renal enhancement and excretion. No hydronephrosis. No perinephric stranding. Stomach/Bowel: Patient is post gastric bypass. High-density material in the excluded gastric lumen is unchanged. There is periduodenal soft tissue stranding and small amount of free fluid. There are no dilated or thickened small bowel loops. Small volume of stool throughout the colon without colonic wall thickening. Right upper ventral abdominal wall hernia contains normal appearing loop of colon. Submucosal fatty infiltration of the ascending colon, unchanged. The appendix is normal. Vascular/Lymphatic: No retroperitoneal adenopathy. Abdominal aorta is normal in caliber. Moderate atherosclerosis without aneurysm. Reproductive: Uterus surgically absent.  No adnexal mass. Bladder: Physiologically distended. Other: Retroperitoneal air in the right upper quadrant of the abdomen. Air appears adjacent to the mass in the porta hepatis. Musculoskeletal: There are no acute or suspicious osseous abnormalities. IMPRESSION: 1. Retroperitoneal air in the right upper quadrant,  likely sequela of recent endoscopic biopsy of porta hepatis mass. Air appears centered about the mass, tracking about the porta hepatis and right retroperitoneum. Small amount  of adjacent retroperitoneal free fluid. No confluent fluid collection or abscess. 2. Right lateral ventral abdominal wall hernia containing normal appearing loops of colon, no obstruction or inflammation. These results were called by telephone at the time of interpretation on 08/22/2015 at 2:09 am to Dr. Thayer Jew , who verbally acknowledged these results. Electronically Signed   By: Jeb Levering M.D.   On: 08/22/2015 02:12   Ct Abdomen Pelvis W Contrast  07/30/2015  CLINICAL DATA:  Epigastric pain and nausea and vomiting for 3 days. Previous gastric bypass surgery. EXAM: CT ABDOMEN AND PELVIS WITH CONTRAST TECHNIQUE: Multidetector CT imaging of the abdomen and pelvis was performed using the standard protocol following bolus administration of intravenous contrast. CONTRAST:  63mL OMNIPAQUE IOHEXOL 300 MG/ML  SOLN COMPARISON:  08/12/2013. FINDINGS: Lower chest:  No acute findings. Hepatobiliary: No liver masses identified. Prior cholecystectomy noted. No evidence of a biliary ductal dilatation. A homogeneous low-attenuation masses seen in the porta hepatis which measures 4.9 x 5.8 cm on image 24/series 2 compared to 4.5 x 5.6 cm previously. This abuts the gastric antrum and pancreatic neck. Pancreas: No mass, inflammatory changes, or other significant abnormality. Spleen: Within normal limits in size and appearance. Adrenals/Urinary Tract: No masses identified. No evidence of hydronephrosis. Stomach/Bowel: Postop changes from gastric bypass surgery again noted. No evidence of bowel obstruction normal appendix visualized. Vascular/Lymphatic: No pathologically enlarged lymph nodes. No evidence of abdominal aortic aneurysm. Reproductive: Prior hysterectomy noted. Adnexal regions are unremarkable in appearance. Other: None. Musculoskeletal:  No suspicious bone lesions identified. IMPRESSION: No acute findings identified within the abdomen or pelvis. 5.8 cm mass in the porta hepatis abutting the gastric  antrum and pancreas, which is mildly increased in size since previous studies. Differential diagnosis includes GI stromal tumor, leiomyoma, lymphoma, and atypical pancreatic neoplasm such as islet cell tumor. Electronically Signed   By: Earle Gell M.D.   On: 07/30/2015 17:12   Dg Abd Acute W/chest  08/22/2015  CLINICAL DATA:  Upper abdominal pain, nausea, and vomiting for 4 hours. Productive cough. Recent procedure. EXAM: DG ABDOMEN ACUTE W/ 1V CHEST COMPARISON:  08/01/2015 FINDINGS: Normal heart size and pulmonary vascularity. No focal airspace disease or consolidation in the lungs. No blunting of costophrenic angles. No pneumothorax. Mediastinal contours appear intact. Scattered gas and stool in the colon. No small or large bowel distention. Linear gas collections along the psoas margins likely representing retroperitoneal air. No free intra-abdominal air. No abnormal air-fluid levels. No radiopaque stones. Surgical clips in the upper abdomen. Degenerative changes in the lumbar spine. Calcified phleboliths in the pelvis. IMPRESSION: No evidence of active pulmonary disease. Suggestion of retroperitoneal gas in the abdomen. Duodenum perforation could have this appearance. Suggest CT for further evaluation. Nonobstructive bowel gas pattern. These results were called by telephone at the time of interpretation on 08/22/2015 at 12:22 am to Dr. Thayer Jew , who verbally acknowledged these results. Electronically Signed   By: Lucienne Capers M.D.   On: 08/22/2015 00:25   Dg Abd Acute W/chest  08/01/2015  CLINICAL DATA:  Nausea, vomiting and diarrhea since 07/28/2015. Initial encounter. EXAM: DG ABDOMEN ACUTE W/ 1V CHEST COMPARISON:  CT abdomen and pelvis 07/30/2015. FINDINGS: Single view of the chest demonstrates clear lungs and normal heart size. No pneumothorax or pleural effusion. Aortic atherosclerosis is noted. Two views of the abdomen show  no free intraperitoneal air. Multiple surgical clips are seen  in the left upper quadrant of the abdomen and there are sutures and clips in the right upper quadrant. The bowel gas pattern is nonobstructive. There is mild convex left lumbar scoliosis. IMPRESSION: No acute finding chest or abdomen. Electronically Signed   By: Inge Rise M.D.   On: 08/01/2015 09:05   Ct Angio Abd/pel W/ And/or W/o  08/25/2015  CLINICAL DATA:  Nausea, abdominal pain, weight loss since 07/29/2015. Retroperitoneal air on recent CT, presumed secondary to endoscopic biopsy of lesion in the porta hepatis. History of diverticulosis, colon polyps, cirrhosis, hysterectomy, gastric bypass and resection, and cholecystectomy. EXAM: CTA ABDOMEN AND PELVIS WITHOUT AND WITH CONTRAST TECHNIQUE: Multidetector CT imaging of the abdomen and pelvis was performed using the standard protocol during bolus administration of intravenous contrast. Multiplanar reconstructed images and MIPs were obtained and reviewed to evaluate the vascular anatomy. CONTRAST:  155mL OMNIPAQUE IOHEXOL 350 MG/ML SOLN COMPARISON:  CT abdomen pelvis dated 08/22/2015 FINDINGS: No evidence of abdominal aortic aneurysm or dissection. Celiac artery, SMA, and IMA remain patent. Atherosclerotic calcifications of the abdominal aorta and branch vessels. Portal vein is extrinsically is narrowed but patent. SMV is patent. Splenic vein is patent. Lower chest: Mild patchy opacity in the right lower lobe, likely atelectasis. Trace right pleural effusion. Hepatobiliary: Liver is within normal limits. Status post cholecystectomy. No intrahepatic or extrahepatic ductal dilatation. Pancreas: Extrinsic compression of the pancreas by a 5.2 x 8.1 cm low-density fluid and gas containing lesion, likely reflecting a postop seroma or pseudocyst, now with gas following recent biopsy. Adjacent retroperitoneal gas (series 9/ images 26, 35, and 37) with an ill-defined 3.5 x 4.8 cm gas collection in the jejunal mesentery (series 9/ image 38), increased.  Surrounding fluid/ inflammatory changes, possibly postprocedural. Superimposed pancreatitis is not entirely excluded. Spleen: Within normal limits. Adrenals/Urinary Tract: Adrenal glands are within normal limits. Kidneys are within normal limits.  No hydronephrosis. Bladder is underdistended but unremarkable. Stomach/Bowel: Stomach is notable for postsurgical changes related to gastric bypass. No evidence of bowel obstruction. Normal appendix (series 9/image 68). Stable right lateral ventral hernia containing a loop of proximal transverse colon (series 9/ image 39). Wall thickening involving mid transverse colon (series 9/ image 55), favored to reflect secondary inflammatory changes. Vascular/Lymphatic: Vascular findings as above. 11 mm short axis portacaval node (series 9/ image 31), likely reactive. Additional small retroperitoneal/ periportal nodes measuring up to 9 mm short axis (series 9/ image 47). Reproductive: Status post hysterectomy. Bilateral ovaries are within normal limits. Other: Peripancreatic/periduodenal inflammatory changes, as above. No free air. Musculoskeletal: Degenerative changes of the visualized thoracolumbar spine. Review of the MIP images confirms the above findings. IMPRESSION: 5.2 x 8.1 cm fluid and gas containing lesion adjacent to the pancreas, favored to reflect a postoperative seroma or pseudocyst, now with gas following recent biopsy. New 3.5 x 4.8 cm gas collection along the jejunal mesentery and additional scattered foci of localized retroperitoneal gas, likely related to recent procedure. No well-defined abscess at the current time. Follow-up CT is suggested in 5-7 days to ensure resolution (or earlier as clinically warranted). No free air. Surrounding peripancreatic/mesenteric fluid inflammatory changes. Correlate for superimposed pancreatitis. No evidence of abdominal aortic aneurysm or dissection. Celiac artery, SMA, and IMA remain patent. Portal vein is narrowed but patent.  SMV and splenic vein are patent. Electronically Signed   By: Julian Hy M.D.   On: 08/25/2015 12:25  [2 weeks]  Assessment: 59 year old female with history of NASH cirrhosis, prior bariatric surgery in remote past, admitted with recurrent upper abdominal pain, N/V after recent EUS (08/20/15) for mass at porta hepatis. Cytology negative for malignancy, and Dr. Ardis Hughs' suspicion for neoplasm is low. She has been referred to Dr. Barry Dienes for surgical consideration in January 2017. Upon admission, notable leukocytosis, which has improved overall. She has remained afebrile. It is felt she may have localized peritonitis from recent EUS/FNA. Abdominal pain improving.. Vomiting resolved. As of note, CT reviewed with Dr. Ardeen Garland and negative for internal hernia.   CTA findings.  5.2 x 8.1 cm fluid and gas containing lesion adjacent to the pancreas, favored to reflect a postoperative seroma or pseudocyst, now with gas following recent biopsy.  New 3.5 x 4.8 cm gas collection along the jejunal mesentery and additional scattered foci of localized retroperitoneal gas, likely related to recent procedure. No well-defined abscess at the current time. Follow-up CT is suggested in 5-7 days to ensure resolution (or earlier as clinically warranted). No free air.  Surrounding peripancreatic/mesenteric fluid inflammatory changes. Correlate for superimposed pancreatitis.  As of note, EUS did show small esophageal varices but not completely evaluated as dedicated EGD not performed. Recommend surveillance EGD as outpatient.    Plan: 1. Elective EGD as outpatient for further assessment of varices noted on EUS. 2. PPI BID. 3. Repeat CT next week. Our office will arrange. Discussed with Dr. Luan Pulling.   [name]. Bernarda Caffey [facility] [phone] 12/22/20169:21 AM     LOS: 4 days

## 2015-08-27 NOTE — Care Management Note (Signed)
Case Management Note  Patient Details  Name: KAZI MALIA MRN: [id number] Date of Birth: [birth year]  Expected Discharge Date:  08/24/15               Expected Discharge Plan:  Home/Self Care  In-House Referral:  NA  Discharge planning Services  CM Consult  Post Acute Care Choice:  NA Choice offered to:  NA  DME Arranged:    DME Agency:     HH Arranged:    [place] Agency:     Status of Service:  Completed, signed off  Medicare Important Message Given:  Yes Date Medicare IM Given:    Medicare IM give by:    Date Additional Medicare IM Given:    Additional Medicare Important Message give by:     If discussed at [place] of Stay Meetings, dates discussed:  08/27/2015  Additional Comments: Pt discharged home with self care today. [place] services ordered but pt has refused. No CM needs.   Sherald Barge, RN 08/27/2015, 11:11 AM

## 2015-08-27 NOTE — Telephone Encounter (Signed)
Patient needs CT Abd/pelvis with Contrast NEXT Thursday to follow up on 2 fluid/gas collections along jejunal mesentery and adjacent to pancreas.

## 2015-08-27 NOTE — Progress Notes (Signed)
D/C'd home in stable condition.  ABT RX [facility]ed to [facility]. All questions and concerns addressed. Taken out via w/c with friend at side.

## 2015-08-27 NOTE — Care Management Important Message (Signed)
Important Message  Patient Details  Name: [name] MRN: [id number] Date of Birth: Jun 29, 1956   Medicare Important Message Given:  Yes    Sherald Barge, RN 08/27/2015, 11:10 AM

## 2015-08-27 NOTE — Progress Notes (Signed)
She says she feels much better and wants to go home. She is able to tolerate medications by mouth. She has not had any more vomiting. I think she is ready for discharge and will discharge her home please see discharge summary for details

## 2015-08-27 NOTE — Discharge Summary (Signed)
Physician Discharge Summary  Patient ID: SACOYA LICHTI MRN: [id number] DOB/AGE: 59-Dec-1957 59 y.o. Primary Care Physician:Analayah Brooke L, MD Admit date: 08/21/2015 Discharge date: 08/27/2015    Discharge Diagnoses:   Active Problems:   Abdominal pain   Nausea   Abdominal mass   Pain following surgery or procedure   Peritonitis (HCC)  schizoaffective disorder    Medication List    TAKE these medications        amoxicillin-clavulanate 875-125 MG tablet  Commonly known as:  AUGMENTIN  Take 1 tablet by mouth every 12 (twelve) hours.     buPROPion 150 MG 12 hr tablet  Commonly known as:  WELLBUTRIN SR  Take 300 mg by mouth daily.     calcium-vitamin D 500-200 MG-UNIT tablet  Commonly known as:  OSCAL WITH D  Take 1 tablet by mouth 2 (two) times daily.     FLUoxetine 40 MG capsule  Commonly known as:  PROZAC  Take 40 mg by mouth daily.     LINZESS 290 MCG Caps capsule  Generic drug:  Linaclotide  TAKE ONE CAPSULE BY MOUTH DAILY 30 MINUTES BEFORE BREAKFAST.     pantoprazole 40 MG tablet  Commonly known as:  PROTONIX  Take 1 tablet (40 mg total) by mouth daily.     risperiDONE 2 MG tablet  Commonly known as:  RISPERDAL  Take 2 mg by mouth at bedtime.     traZODone 150 MG tablet  Commonly known as:  DESYREL  Take 300 mg by mouth at bedtime.        Discharged Condition: Improved    Consults: GI  Significant Diagnostic Studies: Ct Abdomen Pelvis W Contrast  08/22/2015  CLINICAL DATA:  Sharp umbilical abdominal pain and nausea, onset tonight. Abnormal x-ray. EXAM: CT ABDOMEN AND PELVIS WITH CONTRAST TECHNIQUE: Multidetector CT imaging of the abdomen and pelvis was performed using the standard protocol following bolus administration of intravenous contrast. CONTRAST:  53mL OMNIPAQUE IOHEXOL 300 MG/ML SOLN, 131mL OMNIPAQUE IOHEXOL 300 MG/ML SOLN COMPARISON:  Abdominal radiographs 1 day prior.  CT 07/30/2015 FINDINGS: Lower chest:  The included lung bases are  clear. Liver: No focal hepatic lesion. Low-attenuation mass at the porta hepatis measures 6.1 x 4.6 cm, previously 5.8 x 4.9 cm, some internal and very lesional air. Air tracks along the portal vein and in the right retroperitoneum. Hepatobiliary: Postcholecystectomy with clips in the gallbladder fossa. Pancreas: No ductal dilatation. Small amount of fluid adjacent to the pancreatic head and in the retroperitoneum. Spleen: Normal in size and density. Adrenal glands: No nodule. Kidneys: Symmetric renal enhancement and excretion. No hydronephrosis. No perinephric stranding. Stomach/Bowel: Patient is post gastric bypass. High-density material in the excluded gastric lumen is unchanged. There is periduodenal soft tissue stranding and small amount of free fluid. There are no dilated or thickened small bowel loops. Small volume of stool throughout the colon without colonic wall thickening. Right upper ventral abdominal wall hernia contains normal appearing loop of colon. Submucosal fatty infiltration of the ascending colon, unchanged. The appendix is normal. Vascular/Lymphatic: No retroperitoneal adenopathy. Abdominal aorta is normal in caliber. Moderate atherosclerosis without aneurysm. Reproductive: Uterus surgically absent.  No adnexal mass. Bladder: Physiologically distended. Other: Retroperitoneal air in the right upper quadrant of the abdomen. Air appears adjacent to the mass in the porta hepatis. Musculoskeletal: There are no acute or suspicious osseous abnormalities. IMPRESSION: 1. Retroperitoneal air in the right upper quadrant, likely sequela of recent endoscopic biopsy of porta hepatis mass. Air appears centered about the  mass, tracking about the porta hepatis and right retroperitoneum. Small amount of adjacent retroperitoneal free fluid. No confluent fluid collection or abscess. 2. Right lateral ventral abdominal wall hernia containing normal appearing loops of colon, no obstruction or inflammation. These  results were called by telephone at the time of interpretation on 08/22/2015 at 2:09 am to Dr. Thayer Jew , who verbally acknowledged these results. Electronically Signed   By: Jeb Levering M.D.   On: 08/22/2015 02:12   Ct Abdomen Pelvis W Contrast  07/30/2015  CLINICAL DATA:  Epigastric pain and nausea and vomiting for 3 days. Previous gastric bypass surgery. EXAM: CT ABDOMEN AND PELVIS WITH CONTRAST TECHNIQUE: Multidetector CT imaging of the abdomen and pelvis was performed using the standard protocol following bolus administration of intravenous contrast. CONTRAST:  44mL OMNIPAQUE IOHEXOL 300 MG/ML  SOLN COMPARISON:  08/12/2013. FINDINGS: Lower chest:  No acute findings. Hepatobiliary: No liver masses identified. Prior cholecystectomy noted. No evidence of a biliary ductal dilatation. A homogeneous low-attenuation masses seen in the porta hepatis which measures 4.9 x 5.8 cm on image 24/series 2 compared to 4.5 x 5.6 cm previously. This abuts the gastric antrum and pancreatic neck. Pancreas: No mass, inflammatory changes, or other significant abnormality. Spleen: Within normal limits in size and appearance. Adrenals/Urinary Tract: No masses identified. No evidence of hydronephrosis. Stomach/Bowel: Postop changes from gastric bypass surgery again noted. No evidence of bowel obstruction normal appendix visualized. Vascular/Lymphatic: No pathologically enlarged lymph nodes. No evidence of abdominal aortic aneurysm. Reproductive: Prior hysterectomy noted. Adnexal regions are unremarkable in appearance. Other: None. Musculoskeletal:  No suspicious bone lesions identified. IMPRESSION: No acute findings identified within the abdomen or pelvis. 5.8 cm mass in the porta hepatis abutting the gastric antrum and pancreas, which is mildly increased in size since previous studies. Differential diagnosis includes GI stromal tumor, leiomyoma, lymphoma, and atypical pancreatic neoplasm such as islet cell tumor.  Electronically Signed   By: Earle Gell M.D.   On: 07/30/2015 17:12   Dg Abd Acute W/chest  08/22/2015  CLINICAL DATA:  Upper abdominal pain, nausea, and vomiting for 4 hours. Productive cough. Recent procedure. EXAM: DG ABDOMEN ACUTE W/ 1V CHEST COMPARISON:  08/01/2015 FINDINGS: Normal heart size and pulmonary vascularity. No focal airspace disease or consolidation in the lungs. No blunting of costophrenic angles. No pneumothorax. Mediastinal contours appear intact. Scattered gas and stool in the colon. No small or large bowel distention. Linear gas collections along the psoas margins likely representing retroperitoneal air. No free intra-abdominal air. No abnormal air-fluid levels. No radiopaque stones. Surgical clips in the upper abdomen. Degenerative changes in the lumbar spine. Calcified phleboliths in the pelvis. IMPRESSION: No evidence of active pulmonary disease. Suggestion of retroperitoneal gas in the abdomen. Duodenum perforation could have this appearance. Suggest CT for further evaluation. Nonobstructive bowel gas pattern. These results were called by telephone at the time of interpretation on 08/22/2015 at 12:22 am to Dr. Thayer Jew , who verbally acknowledged these results. Electronically Signed   By: Lucienne Capers M.D.   On: 08/22/2015 00:25   Dg Abd Acute W/chest  08/01/2015  CLINICAL DATA:  Nausea, vomiting and diarrhea since 07/28/2015. Initial encounter. EXAM: DG ABDOMEN ACUTE W/ 1V CHEST COMPARISON:  CT abdomen and pelvis 07/30/2015. FINDINGS: Single view of the chest demonstrates clear lungs and normal heart size. No pneumothorax or pleural effusion. Aortic atherosclerosis is noted. Two views of the abdomen show no free intraperitoneal air. Multiple surgical clips are seen in the left upper quadrant of  the abdomen and there are sutures and clips in the right upper quadrant. The bowel gas pattern is nonobstructive. There is mild convex left lumbar scoliosis. IMPRESSION: No acute  finding chest or abdomen. Electronically Signed   By: Inge Rise M.D.   On: 08/01/2015 09:05   Ct Angio Abd/pel W/ And/or W/o  08/25/2015  CLINICAL DATA:  Nausea, abdominal pain, weight loss since 07/29/2015. Retroperitoneal air on recent CT, presumed secondary to endoscopic biopsy of lesion in the porta hepatis. History of diverticulosis, colon polyps, cirrhosis, hysterectomy, gastric bypass and resection, and cholecystectomy. EXAM: CTA ABDOMEN AND PELVIS WITHOUT AND WITH CONTRAST TECHNIQUE: Multidetector CT imaging of the abdomen and pelvis was performed using the standard protocol during bolus administration of intravenous contrast. Multiplanar reconstructed images and MIPs were obtained and reviewed to evaluate the vascular anatomy. CONTRAST:  159mL OMNIPAQUE IOHEXOL 350 MG/ML SOLN COMPARISON:  CT abdomen pelvis dated 08/22/2015 FINDINGS: No evidence of abdominal aortic aneurysm or dissection. Celiac artery, SMA, and IMA remain patent. Atherosclerotic calcifications of the abdominal aorta and branch vessels. Portal vein is extrinsically is narrowed but patent. SMV is patent. Splenic vein is patent. Lower chest: Mild patchy opacity in the right lower lobe, likely atelectasis. Trace right pleural effusion. Hepatobiliary: Liver is within normal limits. Status post cholecystectomy. No intrahepatic or extrahepatic ductal dilatation. Pancreas: Extrinsic compression of the pancreas by a 5.2 x 8.1 cm low-density fluid and gas containing lesion, likely reflecting a postop seroma or pseudocyst, now with gas following recent biopsy. Adjacent retroperitoneal gas (series 9/ images 26, 35, and 37) with an ill-defined 3.5 x 4.8 cm gas collection in the jejunal mesentery (series 9/ image 38), increased. Surrounding fluid/ inflammatory changes, possibly postprocedural. Superimposed pancreatitis is not entirely excluded. Spleen: Within normal limits. Adrenals/Urinary Tract: Adrenal glands are within normal limits.  Kidneys are within normal limits.  No hydronephrosis. Bladder is underdistended but unremarkable. Stomach/Bowel: Stomach is notable for postsurgical changes related to gastric bypass. No evidence of bowel obstruction. Normal appendix (series 9/image 68). Stable right lateral ventral hernia containing a loop of proximal transverse colon (series 9/ image 39). Wall thickening involving mid transverse colon (series 9/ image 55), favored to reflect secondary inflammatory changes. Vascular/Lymphatic: Vascular findings as above. 11 mm short axis portacaval node (series 9/ image 31), likely reactive. Additional small retroperitoneal/ periportal nodes measuring up to 9 mm short axis (series 9/ image 47). Reproductive: Status post hysterectomy. Bilateral ovaries are within normal limits. Other: Peripancreatic/periduodenal inflammatory changes, as above. No free air. Musculoskeletal: Degenerative changes of the visualized thoracolumbar spine. Review of the MIP images confirms the above findings. IMPRESSION: 5.2 x 8.1 cm fluid and gas containing lesion adjacent to the pancreas, favored to reflect a postoperative seroma or pseudocyst, now with gas following recent biopsy. New 3.5 x 4.8 cm gas collection along the jejunal mesentery and additional scattered foci of localized retroperitoneal gas, likely related to recent procedure. No well-defined abscess at the current time. Follow-up CT is suggested in 5-7 days to ensure resolution (or earlier as clinically warranted). No free air. Surrounding peripancreatic/mesenteric fluid inflammatory changes. Correlate for superimposed pancreatitis. No evidence of abdominal aortic aneurysm or dissection. Celiac artery, SMA, and IMA remain patent. Portal vein is narrowed but patent. SMV and splenic vein are patent. Electronically Signed   By: Julian Hy M.D.   On: 08/25/2015 12:25    Lab Results: Basic Metabolic Panel:  Recent Labs  08/25/15 1103  NA 132*  K 3.4*  CL 99*   CO2  26  GLUCOSE 113*  BUN 9  CREATININE 0.91  CALCIUM 8.8*   Liver Function Tests:  Recent Labs  08/25/15 1103  AST 15  ALT 12*  ALKPHOS 99  BILITOT 0.5  PROT 6.0*  ALBUMIN 2.5*     CBC:  Recent Labs  08/26/15 0549 08/27/15 0634  WBC 21.8* 19.3*  NEUTROABS 18.0* 15.2*  HGB 13.2 12.0  HCT 39.0 35.4*  MCV 89.2 88.7  PLT 363 363    No results found for this or any previous visit (from the past 240 hour(s)).   Hospital Course: This is a 59 year old who is in the hospital with nausea and vomiting earlier this month. She improved. She wouldn't was found to have an abdominal mass that had increased in size and she went endoscopic ultrasound and had a biopsy. She then developed abdominal discomfort. She came to the emergency department was treated and her CT showed that she had air in the abdominal cavity probably related to her biopsy. She later was found to have some fluid and it was felt that she had probably Limited peritonitis. She was treated with intravenous antibiotics given anti-emetics and fluids and improved. By the time of discharge her pain was controlled with no medications except over-the-counter her nausea was gone and her abdominal distention was better  Discharge Exam: Blood pressure 138/73, pulse 66, temperature 98.1 F (36.7 C), temperature source Oral, resp. rate 18, height 5\' 4"  (1.626 m), weight 84.5 kg (186 lb 4.6 oz), SpO2 98 %. She is awake and alert. Her abdomen is nontender now. She looks comfortable  Disposition: Home with home health services. She will have repeat CT of the abdomen next week.      Discharge Instructions    Discharge patient    Complete by:  As directed      Face-to-face encounter (required for Medicare/Medicaid patients)    Complete by:  As directed   I Therron Sells L certify that this patient is under my care and that I, or a nurse practitioner or physician's assistant working with me, had a face-to-face encounter that  meets the physician face-to-face encounter requirements with this patient on 08/27/2015. The encounter with the patient was in whole, or in part for the following medical condition(s) which is the primary reason for home health care (List medical condition): Limited peritonitis  The encounter with the patient was in whole, or in part, for the following medical condition, which is the primary reason for home health care:  Limited peritonitis  I certify that, based on my findings, the following services are medically necessary home health services:  Nursing  Reason for Medically Necessary Home Health Services:  Skilled Nursing- Change/Decline in Patient Status  My clinical findings support the need for the above services:  Pain interferes with ambulation/mobility  Further, I certify that my clinical findings support that this patient is homebound due to:  Pain interferes with ambulation/mobility     Home Health    Complete by:  As directed   To provide the following care/treatments:  RN           Follow-up Information    Follow up with Alonza Bogus, MD On 09/10/2015.   Specialty:  Pulmonary Disease   Why:  at 9:00 am   Contact information:   [address] [place]  [id number] [phone]       Signed: Aviyah Swetz L   08/27/2015, 9:32 AM

## 2015-09-03 NOTE — Telephone Encounter (Signed)
Routing to [facility] clinical pool.

## 2015-09-04 ENCOUNTER — Other Ambulatory Visit: Payer: Self-pay

## 2015-09-04 DIAGNOSIS — R188 Other ascites: Secondary | ICD-10-CM

## 2015-09-04 NOTE — Telephone Encounter (Signed)
Pt is set for CT on 09/09/2015 @ 1:30pm.  PA # is [id number]

## 2015-09-04 NOTE — Telephone Encounter (Signed)
Pt is aware of appt

## 2015-09-09 ENCOUNTER — Ambulatory Visit (HOSPITAL_COMMUNITY)
Admission: RE | Admit: 2015-09-09 | Discharge: 2015-09-09 | Disposition: A | Discharge status: Home / Self Care | Payer: Medicare Other | Source: Ambulatory Visit | Attending: Gastroenterology | Admitting: Gastroenterology

## 2015-09-09 DIAGNOSIS — Z9884 Bariatric surgery status: Secondary | ICD-10-CM | POA: Diagnosis not present

## 2015-09-09 DIAGNOSIS — J9811 Atelectasis: Secondary | ICD-10-CM | POA: Diagnosis not present

## 2015-09-09 DIAGNOSIS — I700 Atherosclerosis of aorta: Secondary | ICD-10-CM | POA: Insufficient documentation

## 2015-09-09 DIAGNOSIS — R109 Unspecified abdominal pain: Secondary | ICD-10-CM | POA: Diagnosis not present

## 2015-09-09 DIAGNOSIS — R188 Other ascites: Secondary | ICD-10-CM | POA: Diagnosis not present

## 2015-09-09 DIAGNOSIS — K439 Ventral hernia without obstruction or gangrene: Secondary | ICD-10-CM | POA: Insufficient documentation

## 2015-09-09 DIAGNOSIS — Z09 Encounter for follow-up examination after completed treatment for conditions other than malignant neoplasm: Secondary | ICD-10-CM | POA: Diagnosis not present

## 2015-09-09 DIAGNOSIS — K869 Disease of pancreas, unspecified: Secondary | ICD-10-CM | POA: Diagnosis not present

## 2015-09-09 DIAGNOSIS — M5136 Other intervertebral disc degeneration, lumbar region: Secondary | ICD-10-CM | POA: Insufficient documentation

## 2015-09-09 MED ORDER — IOHEXOL 300 MG/ML  SOLN
100.0000 mL | Freq: Once | INTRAMUSCULAR | Status: AC | PRN
  Administered 2015-09-09: 100 mL via INTRAVENOUS

## 2015-09-10 DIAGNOSIS — R1900 Intra-abdominal and pelvic swelling, mass and lump, unspecified site: Secondary | ICD-10-CM | POA: Diagnosis not present

## 2015-09-10 DIAGNOSIS — R109 Unspecified abdominal pain: Secondary | ICD-10-CM | POA: Diagnosis not present

## 2015-09-10 DIAGNOSIS — I951 Orthostatic hypotension: Secondary | ICD-10-CM | POA: Diagnosis not present

## 2015-09-12 NOTE — Progress Notes (Signed)
Quick Note:  Mild left adrenal gland thickening, ?significance. Decreased size of gas/fluid containing lesion superior to pancreas. Resolution of gas and fluid collection more inferiorly in jejunal mesentery. Mild to moderate residual inflammatory stranding.  Patient needs to keep appointment 09/17/15.   Dr. Gala Romney: what is end point, should we reimage again to look for resolution. ______

## 2015-09-15 DIAGNOSIS — R1906 Epigastric swelling, mass or lump: Secondary | ICD-10-CM | POA: Diagnosis not present

## 2015-09-17 ENCOUNTER — Encounter: Payer: Self-pay | Admitting: Nurse Practitioner

## 2015-09-17 ENCOUNTER — Ambulatory Visit (INDEPENDENT_AMBULATORY_CARE_PROVIDER_SITE_OTHER): Payer: Medicare Other | Admitting: Nurse Practitioner

## 2015-09-17 VITALS — BP 110/72 | HR 69 | Temp 98.3°F | Ht 64.0 in | Wt 183.0 lb

## 2015-09-17 DIAGNOSIS — R1013 Epigastric pain: Secondary | ICD-10-CM

## 2015-09-17 DIAGNOSIS — R1900 Intra-abdominal and pelvic swelling, mass and lump, unspecified site: Secondary | ICD-10-CM | POA: Diagnosis not present

## 2015-09-17 DIAGNOSIS — R110 Nausea: Secondary | ICD-10-CM | POA: Diagnosis not present

## 2015-09-17 DIAGNOSIS — K7460 Unspecified cirrhosis of liver: Secondary | ICD-10-CM | POA: Diagnosis not present

## 2015-09-17 LAB — COMPREHENSIVE METABOLIC PANEL
ALT: 13 U/L (ref 6–29)
AST: 14 U/L (ref 10–35)
Albumin: 3.6 g/dL (ref 3.6–5.1)
Alkaline Phosphatase: 73 U/L (ref 33–130)
BUN: 11 mg/dL (ref 7–25)
CO2: 25 mmol/L (ref 20–31)
Calcium: 8.8 mg/dL (ref 8.6–10.4)
Chloride: 103 mmol/L (ref 98–110)
Creat: 0.92 mg/dL (ref 0.50–1.05)
Glucose, Bld: 97 mg/dL (ref 65–99)
Potassium: 4.1 mmol/L (ref 3.5–5.3)
Sodium: 135 mmol/L (ref 135–146)
Total Bilirubin: 0.4 mg/dL (ref 0.2–1.2)
Total Protein: 6.1 g/dL (ref 6.1–8.1)

## 2015-09-17 LAB — CBC WITH DIFFERENTIAL/PLATELET
Basophils Absolute: 0 10*3/uL (ref 0.0–0.1)
Basophils Relative: 0 % (ref 0–1)
Eosinophils Absolute: 0.3 10*3/uL (ref 0.0–0.7)
Eosinophils Relative: 4 % (ref 0–5)
HCT: 35 % — ABNORMAL LOW (ref 36.0–46.0)
Hemoglobin: 11.3 g/dL — ABNORMAL LOW (ref 12.0–15.0)
Lymphocytes Relative: 31 % (ref 12–46)
Lymphs Abs: 2.5 10*3/uL (ref 0.7–4.0)
MCH: 29.1 pg (ref 26.0–34.0)
MCHC: 32.3 g/dL (ref 30.0–36.0)
MCV: 90.2 fL (ref 78.0–100.0)
MPV: 9.3 fL (ref 8.6–12.4)
Monocytes Absolute: 1 10*3/uL (ref 0.1–1.0)
Monocytes Relative: 12 % (ref 3–12)
Neutro Abs: 4.3 10*3/uL (ref 1.7–7.7)
Neutrophils Relative %: 53 % (ref 43–77)
Platelets: 420 10*3/uL — ABNORMAL HIGH (ref 150–400)
RBC: 3.88 MIL/uL (ref 3.87–5.11)
RDW: 13.7 % (ref 11.5–15.5)
WBC: 8.1 10*3/uL (ref 4.0–10.5)

## 2015-09-17 LAB — PROTIME-INR
INR: 0.98 (ref <1.50)
Prothrombin Time: 13.1 seconds (ref 11.6–15.2)

## 2015-09-17 MED ORDER — SUCRALFATE 1 GM/10ML PO SUSP: 1.0000 g | Freq: Three times a day (TID) | ORAL | Status: DC | PRN

## 2015-09-17 MED ORDER — ONDANSETRON HCL 4 MG PO TABS: 4.0000 mg | ORAL_TABLET | Freq: Three times a day (TID) | ORAL | Status: DC

## 2015-09-17 NOTE — Progress Notes (Signed)
cc'ed to pcp °

## 2015-09-17 NOTE — Assessment & Plan Note (Signed)
Patient with persistent abdominal pain. She recently had an EUS for abdominal mass. Cytology results show no cancer cells and seems to be cystic material. She has seen general surgery and interspersed Avenue meeting today to determine if they want to intervene surgically to remove this mass which does not appear to be cancerous, however is growing. This could be causing some of her symptoms. After her EUS she did have limited peritonitis with air and fluid collection in the retroperitoneal space at which point she began having her abdominal pain. Her abdominal pain is much improved and CT imaging shows improvement of air and fluid pockets. I'm hesitant to repeat CT imaging due to the fact that she has had 2 CAT scans within the past month and this would be quite extensive radiation exposure. Additionally, she seems to be continuing to improve. If she becomes worse can consider reimaging at that point. Her abdominal pain does worsen postprandially. She is on PPI has a history of GERD. We'll send in a protrusion for Carafate 3 times a day as needed for epigastric pain postprandially. Return for follow-up in 6 weeks for further evaluation of symptoms progression.

## 2015-09-17 NOTE — Patient Instructions (Signed)
1. We'll check your routine liver labs today. Have your labs drawn when you're able to. 2. I sent an Zofran 4 mg to your pharmacy for nausea. Take it 3 times a day with meals as needed for nausea. 3. I sent and Carafate suspension to your pharmacy. Take it 3 times a day with meals as needed for epigastric pain after eating. If it is too expensive, call and let us know and we can send in the tabs which she can crush and make into a slurry as we discussed. 4. Return for follow-up in 6 weeks.

## 2015-09-17 NOTE — Progress Notes (Signed)
Referring Provider: Sinda Du, MD Primary Care Physician:  Alonza Bogus, MD Primary GI:  Dr. Gala Romney  Chief Complaint  Patient presents with  . Follow-up    HPI:   60 year old female presents for hospital follow-up. Last seen in our office for 04/25/2015 for constipation and cirrhosis care. At that time she. (Quite well with her cirrhosis with a low meld score and preserved hepatic synthetic function. She also had a stable intra-abdominal lesion likely a lymph node with no recommend further evaluation as it was likely a sebaceous cyst on her anterior abdominal/chest wall on CT. She is recommended to continue Linzess daily, ultrasound elastography in September, hepatic profile in 6 months, encouraged weight loss and exercise. She is been admitted the hospital twice. Initially on 08/01/2015 for intractable nausea and vomiting with a final discharge diagnosis of dehydration and COPD exacerbation. On 08/20/2015 she underwent an EUS for abdominal mass by Dr. Ardis Hughs. She was admitted the next day or post procedure abdominal pain and nausea with 10 out of 10 pain. CT abdomen in the emergency room which found retroperitoneal air likely a sequela of recent procedure. Also with psychosis with a white cell count of 24,000. She subsequently deemed to have limited peritonitis and was treated with antibiotics and fluids. Pain was under control and nausea improved and she was discharged to home with home health. Repeat CT scan on 09/09/2015 found decreased size of gas and fluid containing lesions appear to the pancreas, resolution of gas and fluid collection more inferior to the jejunal mesentery. Mild to moderate residual inflammatory stranding in this region.   EUS findings include "well-circumscribed 5.2 cm chronic mass in the porta hepatis, mass present since 2011 slowly growing, not solid but rather filled with gelatinous material that was aspirated and sent to cytology. Preliminary cytology review  shows cyst contents. Awaiting final testing. Scheduled to meet with Dr. Cher Nakai in 2-3 weeks, low suspicion for neoplasm at this time. I'll cytology found no sign of cancer cells, still not sure what lesion represents but it is growing. Keep appointment with Dr. Cher Nakai in 1-2 weeks." Final cytology shows debris, benign epithelial cells, no malignant cells identified.  Today she states she saw Dr. Barry Dienes and they're supposed to have a meeting today to discuss if they want to intervene surgically. Today she states she feels ok with exception of nausea when eating and dull ache in the mid abdomen to epigastric area. Increase pain when pressing. Overall much improved. Epigastric pain is worse after eating. Improves after about 1-2 hours. Not taking anything for epigastric pain. Denies yellowing of skin/eyes, episodic confusion, generalized pruritis. [place] working well, is out and needs refill. Denies chest pain, dyspnea, dizziness, lightheadedness, syncope, near syncope. Denies any other upper or lower GI symptoms.  Past Medical History  Diagnosis Date  . Depression   . Chronic constipation   . Diverticulosis 01/31/2008    colonoscopy Dr Gala Romney  . Obesity   . Schizoaffective disorder     Mental Health-Dr Elias Else  . PUD (peptic ulcer disease) 1988  . Colonic inertia   . Hyperplastic colon polyp   . Diverticula of colon   . Cirrhosis ([place])     child pugh class A and MELD 5, completed hep A and B vaccine series, no HCC on 3/16 MRI    Past Surgical History  Procedure Laterality Date  . Tonsillectomy    . S/p hysterectomy      partial  . Gastric bypass  with Revision, Dr. Duffy Rhody  . Wrist surgery    . Foot surgery      left  . Cholecystectomy  08/2010    cholelithiasis; Dr Arnoldo Morale  . Colonoscopy  01/31/2008    Dr. Gala Romney- normal rectum, L sided diverticula, long tortuous colon. hyperplasitic  polyp.  . Ercp w/ sphincterotomy and balloon dilation  08/05/2010    normal ampulla/mild  erythema in the gastric remnant without ulcerations or erosions. normal retroflexed view of the cardia/anastomosis normal/distal common bile duct stones s/p extraction and shincterotomy  . Esophagogastroduodenoscopy N/A 10/24/2013    [id number] esophagus. Surgically altered stomach. Abnormal gastric mucosa  -  status post biopsy (reactive gastrophathy)  . Eus N/A 08/20/2015    Dr. Ardis Hughs: well-circumscribed 5.2 cm chronic mass in porta hepatis, internally mass is gelatinous. Small distal esophageal varices (limited view), Bilroth 1 type anatomy, cytology NEGATIVE for malignant cells. Suspicion for neoplasm low     Current Outpatient Prescriptions  Medication Sig Dispense Refill  . calcium-vitamin D (OSCAL WITH D) 500-200 MG-UNIT tablet Take 1 tablet by mouth 2 (two) times daily.    Marland Kitchen FLUoxetine (PROZAC) 40 MG capsule Take 40 mg by mouth daily.     Marland Kitchen LINZESS 290 MCG CAPS capsule TAKE ONE CAPSULE BY MOUTH DAILY 30 MINUTES BEFORE BREAKFAST. 30 capsule 11  . pantoprazole (PROTONIX) 40 MG tablet Take 1 tablet (40 mg total) by mouth daily. 30 tablet 12  . risperiDONE (RISPERDAL) 2 MG tablet Take 2 mg by mouth at bedtime.    . traZODone (DESYREL) 150 MG tablet Take 300 mg by mouth at bedtime.    Marland Kitchen amoxicillin-clavulanate (AUGMENTIN) 875-125 MG tablet Take 1 tablet by mouth every 12 (twelve) hours. (Patient not taking: Reported on 09/17/2015) 20 tablet 0   No current facility-administered medications for this visit.    Allergies as of 09/17/2015 - Review Complete 09/17/2015  Allergen Reaction Noted  . Ciprofloxacin Rash     Family History  Problem Relation Age of Onset  . Colon cancer Maternal Uncle 19  . Diabetes Father   . Liver disease Neg Hx     Social History   Social History  . Marital Status: Divorced    Spouse Name: N/A  . Number of Children: 2  . Years of Education: N/A   Occupational History  . disabled    Social History Main Topics  . Smoking status: Former Smoker -- 1.00  packs/day for 30 years    Types: Cigarettes    Quit date: 07/06/2015  . Smokeless tobacco: Former Systems developer     Comment: Quit smoking x 10 years  . Alcohol Use: No  . Drug Use: No  . Sexual Activity: No   Other Topics Concern  . None   Social History Narrative   Lives w/ mother   2 grown children    Review of Systems: General: Negative for anorexia, weight loss, fever, chills, fatigue, weakness. ENT: Negative for hoarseness, difficulty swallowing. CV: Negative for chest pain, angina, palpitations, peripheral edema.  Respiratory: Negative for dyspnea at rest, cough, sputum, wheezing.  GI: See history of present illness. Heme: Negative for bruising or bleeding.   Physical Exam: BP 110/72 mmHg  Pulse 69  Temp(Src) 98.3 F (36.8 C) (Oral)  Ht 5\' 4"  (1.626 m)  Wt 183 lb (83.008 kg)  BMI 31.40 kg/m2 General:   Alert and oriented. Pleasant and cooperative. Well-nourished and well-developed.  Head:  Normocephalic and atraumatic. Eyes:  Without icterus, sclera clear and conjunctiva pink.  Ears:  Normal auditory acuity. Cardiovascular:  S1, S2 present without murmurs appreciated. Extremities without clubbing or edema. Respiratory:  Clear to auscultation bilaterally. No wheezes, rales, or rhonchi. No distress.  Gastrointestinal:  +BS, soft, and non-distended. Mild epigastric TTP. No HSM noted. No guarding or rebound. No masses appreciated.  Rectal:  Deferred  Neurologic:  Alert and oriented x4;  grossly normal neurologically. Psych:  Alert and cooperative. Normal mood and affect. Heme/Lymph/Immune: No excessive bruising noted.    09/17/2015 9:29 AM

## 2015-09-17 NOTE — Assessment & Plan Note (Signed)
Patient status post EUS for abdominal mass which found a well-circumscribed 5.2 cm mass in the porta hepatis which is slowly growing since 2011. It was found to be filled with gelatinous material and on cytology seems to be cystic material. Low suspicion for metastasis. Has seen a surgeon and they're considering surgical intervention. There is post be having a meeting today to make final decisions. Need to follow, anticipate surgical recommendations. Return for follow-up in 6 weeks.

## 2015-09-17 NOTE — Assessment & Plan Note (Signed)
Patient with noted postprandial nausea. This is in addition to her epigastric pain which worsens postprandially as well. I will send an Zofran 4 mg 3 times a day before meals as needed for nausea. Return for follow-up in 6 weeks.

## 2015-09-17 NOTE — Assessment & Plan Note (Signed)
60 year old female with cirrhosis currently due for repeat labs. Today we will order CBC, CMP, PTT/INR, AFP. As of her last exam she seems to be well compensated. She was due for an endoscopy for variceal surveillance however she just had an endoscopic ultrasound which revealed very small distal esophageal varices. May need to consider nonselective beta blocker for variceal prophylaxis when she is over her acute situation. Return for follow-up in 6 weeks.

## 2015-09-18 LAB — AFP TUMOR MARKER: AFP-Tumor Marker: 2.6 ng/mL (ref <6.1)

## 2015-09-18 NOTE — Progress Notes (Addendum)
Calculations based on OV labs:  MELD: 6 Child Pugh: A  Well compensated liver disease.

## 2015-09-22 NOTE — Progress Notes (Signed)
Quick Note:  Please let patient know Dr. Gala Romney also looked at CT findings.  He agrees with continue to follow surgery for their input regarding these CT findings. Send copy to Dr. Barry Dienes. He also recommends she follow up with PCP regarding mild left adrenal gland thickening. May or may not need further work up. Send copy of CT to PCP. Keep upcoming OV with our practice next month. ______

## 2015-09-28 ENCOUNTER — Telehealth: Payer: Self-pay | Admitting: Internal Medicine

## 2015-09-28 NOTE — Telephone Encounter (Signed)
Pt had ct scan on 09/09/2015 does pt need this [place] now.

## 2015-09-28 NOTE — Telephone Encounter (Signed)
Recall for ultrasound 

## 2015-09-29 NOTE — Telephone Encounter (Signed)
No need for u/s right now. NIC for ruq u/s in 03/2016

## 2015-09-29 NOTE — Telephone Encounter (Signed)
Noted and placed on recall list

## 2015-10-24 ENCOUNTER — Emergency Department (HOSPITAL_COMMUNITY): Payer: Medicare Other

## 2015-10-24 ENCOUNTER — Encounter (HOSPITAL_COMMUNITY): Payer: Self-pay | Admitting: Emergency Medicine

## 2015-10-24 ENCOUNTER — Emergency Department (HOSPITAL_COMMUNITY)
Admission: EM | Admit: 2015-10-24 | Discharge: 2015-10-24 | Disposition: A | Discharge status: Home / Self Care | Payer: Medicare Other | Attending: Emergency Medicine | Admitting: Emergency Medicine

## 2015-10-24 DIAGNOSIS — Z9049 Acquired absence of other specified parts of digestive tract: Secondary | ICD-10-CM | POA: Insufficient documentation

## 2015-10-24 DIAGNOSIS — Z87891 Personal history of nicotine dependence: Secondary | ICD-10-CM | POA: Insufficient documentation

## 2015-10-24 DIAGNOSIS — Z8711 Personal history of peptic ulcer disease: Secondary | ICD-10-CM | POA: Diagnosis not present

## 2015-10-24 DIAGNOSIS — I959 Hypotension, unspecified: Secondary | ICD-10-CM | POA: Insufficient documentation

## 2015-10-24 DIAGNOSIS — R112 Nausea with vomiting, unspecified: Secondary | ICD-10-CM | POA: Diagnosis not present

## 2015-10-24 DIAGNOSIS — Z79899 Other long term (current) drug therapy: Secondary | ICD-10-CM | POA: Diagnosis not present

## 2015-10-24 DIAGNOSIS — Z9884 Bariatric surgery status: Secondary | ICD-10-CM | POA: Diagnosis not present

## 2015-10-24 DIAGNOSIS — Z90710 Acquired absence of both cervix and uterus: Secondary | ICD-10-CM | POA: Insufficient documentation

## 2015-10-24 DIAGNOSIS — R1013 Epigastric pain: Secondary | ICD-10-CM

## 2015-10-24 DIAGNOSIS — F329 Major depressive disorder, single episode, unspecified: Secondary | ICD-10-CM | POA: Diagnosis not present

## 2015-10-24 DIAGNOSIS — R1010 Upper abdominal pain, unspecified: Secondary | ICD-10-CM | POA: Diagnosis not present

## 2015-10-24 DIAGNOSIS — Z86010 Personal history of colonic polyps: Secondary | ICD-10-CM | POA: Insufficient documentation

## 2015-10-24 DIAGNOSIS — F259 Schizoaffective disorder, unspecified: Secondary | ICD-10-CM | POA: Insufficient documentation

## 2015-10-24 DIAGNOSIS — E669 Obesity, unspecified: Secondary | ICD-10-CM | POA: Insufficient documentation

## 2015-10-24 DIAGNOSIS — K5904 Chronic idiopathic constipation: Secondary | ICD-10-CM | POA: Diagnosis not present

## 2015-10-24 HISTORY — DX: Hepatomegaly, not elsewhere classified: R16.0

## 2015-10-24 LAB — CBC WITH DIFFERENTIAL/PLATELET
Basophils Absolute: 0 10*3/uL (ref 0.0–0.1)
Basophils Relative: 0 %
Eosinophils Absolute: 0 10*3/uL (ref 0.0–0.7)
Eosinophils Relative: 0 %
HCT: 39 % (ref 36.0–46.0)
Hemoglobin: 13.1 g/dL (ref 12.0–15.0)
Lymphocytes Relative: 12 %
Lymphs Abs: 1.9 10*3/uL (ref 0.7–4.0)
MCH: 29.6 pg (ref 26.0–34.0)
MCHC: 33.6 g/dL (ref 30.0–36.0)
MCV: 88 fL (ref 78.0–100.0)
Monocytes Absolute: 1.9 10*3/uL — ABNORMAL HIGH (ref 0.1–1.0)
Monocytes Relative: 12 %
Neutro Abs: 11.8 10*3/uL — ABNORMAL HIGH (ref 1.7–7.7)
Neutrophils Relative %: 76 %
Platelets: 414 10*3/uL — ABNORMAL HIGH (ref 150–400)
RBC: 4.43 MIL/uL (ref 3.87–5.11)
RDW: 13.7 % (ref 11.5–15.5)
WBC: 15.6 10*3/uL — ABNORMAL HIGH (ref 4.0–10.5)

## 2015-10-24 LAB — COMPREHENSIVE METABOLIC PANEL
ALT: 14 U/L (ref 14–54)
AST: 14 U/L — ABNORMAL LOW (ref 15–41)
Albumin: 4 g/dL (ref 3.5–5.0)
Alkaline Phosphatase: 79 U/L (ref 38–126)
Anion gap: 11 (ref 5–15)
BUN: 15 mg/dL (ref 6–20)
CO2: 24 mmol/L (ref 22–32)
Calcium: 9.7 mg/dL (ref 8.9–10.3)
Chloride: 102 mmol/L (ref 101–111)
Creatinine, Ser: 0.8 mg/dL (ref 0.44–1.00)
GFR calc Af Amer: >60 mL/min (ref >60)
GFR calc non Af Amer: >60 mL/min (ref >60)
Glucose, Bld: 104 mg/dL — ABNORMAL HIGH (ref 65–99)
Potassium: 3.2 mmol/L — ABNORMAL LOW (ref 3.5–5.1)
Sodium: 137 mmol/L (ref 135–145)
Total Bilirubin: 0.6 mg/dL (ref 0.3–1.2)
Total Protein: 7.4 g/dL (ref 6.5–8.1)

## 2015-10-24 LAB — LIPASE, BLOOD: Lipase: 42 U/L (ref 11–51)

## 2015-10-24 MED ORDER — ONDANSETRON HCL 8 MG PO TABS: 8.0000 mg | ORAL_TABLET | ORAL | Status: DC | PRN

## 2015-10-24 MED ORDER — IOHEXOL 300 MG/ML  SOLN
100.0000 mL | Freq: Once | INTRAMUSCULAR | Status: AC | PRN
  Administered 2015-10-24: 100 mL via INTRAVENOUS

## 2015-10-24 MED ORDER — ONDANSETRON HCL 4 MG/2ML IJ SOLN
4.0000 mg | Freq: Once | INTRAMUSCULAR | Status: AC
  Administered 2015-10-24: 4 mg via INTRAVENOUS
  Filled 2015-10-24: qty 2

## 2015-10-24 MED ORDER — SODIUM CHLORIDE 0.9 % IV BOLUS (SEPSIS)
1000.0000 mL | Freq: Once | INTRAVENOUS | Status: AC
  Administered 2015-10-24: 1000 mL via INTRAVENOUS

## 2015-10-24 MED ORDER — MORPHINE SULFATE (PF) 4 MG/ML IV SOLN
4.0000 mg | Freq: Once | INTRAVENOUS | Status: AC
  Administered 2015-10-24: 4 mg via INTRAVENOUS
  Filled 2015-10-24: qty 1

## 2015-10-24 MED ORDER — OXYCODONE-ACETAMINOPHEN 5-325 MG PO TABS: 1.0000 | ORAL_TABLET | ORAL | Status: DC | PRN

## 2015-10-24 MED ORDER — IOHEXOL 300 MG/ML  SOLN
25.0000 mL | Freq: Once | INTRAMUSCULAR | Status: AC | PRN
  Administered 2015-10-24: 25 mL via ORAL

## 2015-10-24 NOTE — ED Notes (Signed)
Patient c/o mid abd pain with nausea and vomiting. Denies any diarrhea, fevers, or urinary symptoms. Per patient hd CT in January that showed a mass behind liver/or under liver.

## 2015-10-24 NOTE — Discharge Instructions (Signed)
CT scan showed no life-threatening condition. Medication for pain and nausea. Follow-up your primary care doctor.

## 2015-10-24 NOTE — ED Provider Notes (Addendum)
CSN: [id number]     Arrival date & time 10/24/15  1311 History   First MD Initiated Contact with Patient 10/24/15 1514     Chief Complaint  Patient presents with  . Abdominal Pain     (Consider location/radiation/quality/duration/timing/severity/associated sxs/prior Treatment) HPI..... Upper abdominal pain since Thursday a.m. with associated nausea and vomiting. She has been evaluated by general surgery since 2014 for a "mass around her liver". There has been no recommendations for further treatment at this time. She has not been eating and drinking. However she has been urinating. No fever, sweats, chills, dysuria, chest pain, dyspnea. She feels weak. MRI scan of the abdomen on 11/20/2014 revealed a 4.1 cm porta hepatis lymph node.  Past Medical History  Diagnosis Date  . Depression   . Chronic constipation   . Diverticulosis 01/31/2008    colonoscopy Dr Gala Romney  . Obesity   . Schizoaffective disorder     Mental Health-Dr Elias Else  . PUD (peptic ulcer disease) 1988  . Colonic inertia   . Hyperplastic colon polyp   . Diverticula of colon   . Cirrhosis ([place])     child pugh class A and MELD 5, completed hep A and B vaccine series, no HCC on 3/16 MRI  . Liver mass    Past Surgical History  Procedure Laterality Date  . Tonsillectomy    . S/p hysterectomy      partial  . Gastric bypass      with Revision, Dr. Duffy Rhody  . Wrist surgery    . Foot surgery      left  . Cholecystectomy  08/2010    cholelithiasis; Dr Arnoldo Morale  . Colonoscopy  01/31/2008    Dr. Gala Romney- normal rectum, L sided diverticula, long tortuous colon. hyperplasitic  polyp.  . Ercp w/ sphincterotomy and balloon dilation  08/05/2010    normal ampulla/mild erythema in the gastric remnant without ulcerations or erosions. normal retroflexed view of the cardia/anastomosis normal/distal common bile duct stones s/p extraction and shincterotomy  . Esophagogastroduodenoscopy N/A 10/24/2013    [id number] esophagus. Surgically  altered stomach. Abnormal gastric mucosa  -  status post biopsy (reactive gastrophathy)  . Eus N/A 08/20/2015    Dr. Ardis Hughs: well-circumscribed 5.2 cm chronic mass in porta hepatis, internally mass is gelatinous. Small distal esophageal varices (limited view), Bilroth 1 type anatomy, cytology NEGATIVE for malignant cells. Suspicion for neoplasm low    Family History  Problem Relation Age of Onset  . Colon cancer Maternal Uncle 58  . Diabetes Father   . Liver disease Neg Hx    Social History  Substance Use Topics  . Smoking status: Former Smoker -- 1.00 packs/day for 30 years    Types: Cigarettes    Quit date: 10/21/2015  . Smokeless tobacco: Never Used  . Alcohol Use: No   OB History    No data available     Review of Systems  All other systems reviewed and are negative.     Allergies  Ciprofloxacin  Home Medications   Prior to Admission medications   Medication Sig Start Date End Date Taking? Authorizing Provider  calcium-vitamin D (OSCAL WITH D) 500-200 MG-UNIT tablet Take 1 tablet by mouth 2 (two) times daily.   Yes Historical Provider, MD  FLUoxetine (PROZAC) 40 MG capsule Take 40 mg by mouth daily.  06/06/12  Yes Historical Provider, MD  LINZESS 290 MCG CAPS capsule TAKE ONE CAPSULE BY MOUTH DAILY 30 MINUTES BEFORE BREAKFAST. 07/20/15  Yes Carlis Stable, NP  pantoprazole (PROTONIX) 40 MG tablet Take 1 tablet (40 mg total) by mouth daily. 08/04/15  Yes Sinda Du, MD  risperiDONE (RISPERDAL) 2 MG tablet Take 2 mg by mouth at bedtime.   Yes Historical Provider, MD  sucralfate (CARAFATE) 1 GM/10ML suspension Take 10 mLs (1 g total) by mouth 3 (three) times daily with meals as needed. 09/17/15  Yes Carlis Stable, NP  traZODone (DESYREL) 150 MG tablet Take 300 mg by mouth at bedtime. 07/20/15  Yes Historical Provider, MD  ondansetron (ZOFRAN) 8 MG tablet Take 1 tablet (8 mg total) by mouth every 4 (four) hours as needed. 10/24/15   Nat Christen, MD  oxyCODONE-acetaminophen  (PERCOCET) 5-325 MG tablet Take 1-2 tablets by mouth every 4 (four) hours as needed. 10/24/15   Nat Christen, MD   BP 92/49 mmHg  Pulse 52  Temp(Src) 98 F (36.7 C) (Oral)  Resp 20  Ht 5\' 4"  (1.626 m)  Wt 182 lb (82.555 kg)  BMI 31.22 kg/m2  SpO2 96% Physical Exam  Constitutional: She is oriented to person, place, and time. She appears well-developed and well-nourished.  HENT:  Head: Normocephalic and atraumatic.  Eyes: Conjunctivae and EOM are normal. Pupils are equal, round, and reactive to light.  Neck: Normal range of motion. Neck supple.  Cardiovascular: Normal rate and regular rhythm.   Pulmonary/Chest: Effort normal and breath sounds normal.  Abdominal: Soft. Bowel sounds are normal.  Minimal upper abdominal tenderness  Musculoskeletal: Normal range of motion.  Neurological: She is alert and oriented to person, place, and time.  Skin: Skin is warm and dry.  Psychiatric: She has a normal mood and affect. Her behavior is normal.  Nursing note and vitals reviewed.   ED Course  Procedures (including critical care time) Labs Review Labs Reviewed  CBC WITH DIFFERENTIAL/PLATELET - Abnormal; Notable for the following:    WBC 15.6 (*)    Platelets 414 (*)    Neutro Abs 11.8 (*)    Monocytes Absolute 1.9 (*)    All other components within normal limits  COMPREHENSIVE METABOLIC PANEL - Abnormal; Notable for the following:    Potassium 3.2 (*)    Glucose, Bld 104 (*)    AST 14 (*)    All other components within normal limits  LIPASE, BLOOD    Imaging Review Ct Abdomen Pelvis W Contrast  10/24/2015  CLINICAL DATA:  Upper abdominal pain, history of chronic constipation diverticulosis peptic ulcer colonic inertia liver mass cirrhosis gastric bypass cholecystectomy hysterectomy EXAM: CT ABDOMEN AND PELVIS WITH CONTRAST TECHNIQUE: Multidetector CT imaging of the abdomen and pelvis was performed using the standard protocol following bolus administration of intravenous contrast.  CONTRAST:  84mL OMNIPAQUE IOHEXOL 300 MG/ML SOLN, 137mL OMNIPAQUE IOHEXOL 300 MG/ML SOLN COMPARISON:  09/09/2015 FINDINGS: Lower chest:  Clear Hepatobiliary: Mild steatosis. Status post cholecystectomy. 3.5 cm subtle lesion lateral left lobe of the liver with questionable subtle peripheral enhancement and subtle low-attenuation center not seen clearly on 08/25/2015 12/18/2015. The lesion is also not seen on 11/20/2014 MRI. Pancreas: Normal Spleen: Normal Adrenals/Urinary Tract: Normal Stomach/Bowel: Status post gastric bypass. Fluid-filled lesion between the porta hepatis at lesser curvature of the stomach causing inferior mass effect on the pancreas, similar to prior study measuring 6 cm currently as opposed to 5.6 cm previously. Differential diagnostic possibilities again include postoperative seroma or pancreatic pseudocyst. Small bowel and appendix normal. Large bowel is involved with hernia as described below. Vascular/Lymphatic: Mild atherosclerotic calcification of the aortoiliac vessels. Reproductive: Uterus absent.  No pelvic masses. Other: Hepatic flexure of the colon is involved within anterior right abdominal wall hernia. No evidence of strangulation or obstruction. Musculoskeletal: No acute findings IMPRESSION: Stable right ventral hernia Status post gastric bypass. Fluid-filled structure between the porta hepatis and lesser curvature stomach minimally larger when compared to prior study. Possible subtle left hepatic mass not seen on prior studies. Consider evaluation with liver MRI. Electronically Signed   By: Skipper Cliche M.D.   On: 10/24/2015 19:27   Dg Abd Acute W/chest  10/24/2015  CLINICAL DATA:  Nausea and vomiting for 2 days. Clinical concern for small-bowel obstruction. EXAM: DG ABDOMEN ACUTE W/ 1V CHEST COMPARISON:  CT 09/09/2015 FINDINGS: The cardiomediastinal contours are normal.  The lungs are clear. There is no free intra-abdominal air under the diaphragms. The air fluid collection  adjacent to the superior pancreas on prior CT is not definitively identified radiographically. The colon containing right upper quadrant abdominal wall hernia is not seen radiographically. No dilated bowel loops to suggest obstruction. Small volume of stool throughout the colon. Multiple surgical clips and coils in the upper abdomen. No acute osseous abnormalities are seen. IMPRESSION: 1. No dilated bowel loops to suggest obstruction. No evidence of free air. 2. Postsurgical change with multiple surgical clips in the upper abdomen. Patient with history of gastric bypass and cholecystectomy. Electronically Signed   By: Jeb Levering M.D.   On: 10/24/2015 17:09   I have personally reviewed and evaluated these images and lab results as part of my medical decision-making.   EKG Interpretation None      MDM   Final diagnoses:  Abdominal pain, epigastric    No acute abdomen. Patient was slightly hypotensive but she does not appear toxic. CT scan shows stable right ventral hernia, status post gastric bypass, fluid-filled structure between the porta hepatis and lesser curvature of the stomach, possible subtle left hepatic mass. These findings were discussed with the patient. She will follow-up with your general surgeon in [place]. Discharge medications Percocet and Zofran 8 mg.  Copy of CT report given to patient    Nat Christen, MD 10/24/15 2029  Nat Christen, MD 10/24/15 2030

## 2015-11-04 ENCOUNTER — Other Ambulatory Visit: Payer: Self-pay

## 2015-11-04 ENCOUNTER — Encounter: Payer: Self-pay | Admitting: Nurse Practitioner

## 2015-11-04 ENCOUNTER — Ambulatory Visit (INDEPENDENT_AMBULATORY_CARE_PROVIDER_SITE_OTHER): Payer: Medicare Other | Admitting: Nurse Practitioner

## 2015-11-04 VITALS — BP 102/64 | HR 63 | Temp 97.2°F | Ht 64.0 in | Wt 177.2 lb

## 2015-11-04 DIAGNOSIS — R112 Nausea with vomiting, unspecified: Secondary | ICD-10-CM | POA: Diagnosis not present

## 2015-11-04 DIAGNOSIS — K7460 Unspecified cirrhosis of liver: Secondary | ICD-10-CM | POA: Diagnosis not present

## 2015-11-04 DIAGNOSIS — R1900 Intra-abdominal and pelvic swelling, mass and lump, unspecified site: Secondary | ICD-10-CM | POA: Diagnosis not present

## 2015-11-04 MED ORDER — ONDANSETRON HCL 8 MG PO TABS: 8.0000 mg | ORAL_TABLET | ORAL | Status: DC | PRN

## 2015-11-04 NOTE — Patient Instructions (Addendum)
1. We'll schedule your procedure for you. 2. I sent in a refill of Zofran to your pharmacy. 3. If your abdominal pain returns call the surgeon, the emergency room, or [place]. 4. Return for follow-up in 5 months for routine cirrhosis care. You can return sooner if you have any worsening symptoms.

## 2015-11-04 NOTE — Assessment & Plan Note (Signed)
Hepatic mass decreasing in size and found to be gelatinous on aspirate biopsy. Deemed likely benign. Was essentially asymptomatic when she saw the surgeon. They will follow up with her in 6 months. Recommend she call the surgeon if any recurrent significant abdominal pain, or she can call us if needed for recurrent symptoms. Return for follow-up in 4-5 months for routine cirrhosis care, or sooner if needed.

## 2015-11-04 NOTE — Progress Notes (Signed)
CC'ED TO PCP 

## 2015-11-04 NOTE — Assessment & Plan Note (Signed)
History of hepatic cirrhosis, labs and imaging are up-to-date as of last month. She is currently due for endoscopy for surveillance of esophageal varices. We'll plan for this. Return for follow-up in approximately 4-5 months for routine cirrhosis care or sooner if needed.  Proceed with EGD with Dr. Gala Romney in the OR on propofol/MAC in near future: the risks, benefits, and alternatives have been discussed with the patient in detail. The patient states understanding and desires to proceed.  The patient is currently on trazodone, risperidone, oxycodone, Prozac. Given polypharmacy we will plan for the procedure and the OR on propofol/MAC to promote adequate sedation. She is not on any anticoagulants.

## 2015-11-04 NOTE — Progress Notes (Signed)
Referring Provider: Sinda Du, MD Primary Care Physician:  Alonza Bogus, MD Primary GI:  Dr. Gala Romney  Chief Complaint  Patient presents with  . Follow-up    HPI:   [name] is a 60 y.o. female who presents for follow-up on abdominal pain and cirrhosis. She was last seen in our office 09/17/2015 for abdominal pain, abdominal mass, cirrhosis, nausea. At that time showed well compensated cirrhosis but noted stable intra-abdominal lesion on imaging. Underwent EUS for this mass by Dr. Ardis Hughs for abdominal pain and CT at that time showed retroperitoneal air likely sequela of recent procedure. 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 Preliminary cytology review shows cyst contents. Low suspicion for neoplasm. She was referred to surgery and she saw them 06/24/2016. They noted the well-circumscribed mass in the porta hepatis goes back in 2011 and has grown since that time from 4+ centimeters to 5.2 cm. FNA and results significant gelatinous material without cellular contents. History of cirrhosis but stopped drinking. Noted significant decrease in size of the gas and fluid containing lesion, less pain and nausea, improvement with antireflux measures. At that time denied nausea, vomiting, weight loss. Impression included decreased size of gas and fluid lesion superior to the pancreas and resolution of gas and fluid collection inferior to the jejunal mesentery with mild to moderate residual inflammatory stranding in this region. Recommended 6 month follow-up with them due to minimally symptomatic at that time.  Last EGD completed 10/24/2013 to screen for esophageal varices which found normal esophagus, surgically altered stomach, abnormal gastric mucosa status post biopsy. Biopsy found reactive gastropathy with mild  chronic inflammation and without H. pylori. Recommend repeat screening in 2 years which is currently due.  Today she states she was in the ER on the 18th for abdominal pain and vomiting. She was treated and discharged by the ER with recommendations to follow-up with surgeon, which she states she did not do. Denies abdominal pain, N/V since then. "I feel much better." Denies yellowing of skin/eyes, darkened urine, acute episodic confusion, excessive bleeding/bruising. Denies fever, chills, has lost 5 lbs in the past 2 months not trying. Appetite is ok. Denies hematochezia, melena, changes in bowel movements.  Last colonoscopy about 8 years ago which found 3 diminutive polyps clustered together which were removed, left-sided diverticula. Polyps found to be hyperplastic.  Past Medical History  Diagnosis Date  . Depression   . Chronic constipation   . Diverticulosis 01/31/2008    colonoscopy Dr Gala Romney  . Obesity   . Schizoaffective disorder     Mental Health-Dr Elias Else  . PUD (peptic ulcer disease) 1988  . Colonic inertia   . Hyperplastic colon polyp   . Diverticula of colon   . Cirrhosis ([place])     child pugh class A and MELD 5, completed hep A and B vaccine series, no HCC on 3/16 MRI  . Liver mass     Past Surgical History  Procedure Laterality Date  . Tonsillectomy    . S/p hysterectomy      partial  . Gastric bypass      with Revision, Dr. Duffy Rhody  . Wrist surgery    . Foot surgery      left  . Cholecystectomy  08/2010    cholelithiasis; Dr Arnoldo Morale  . Colonoscopy  01/31/2008    Dr. Gala Romney- normal  rectum, L sided diverticula, long tortuous colon. hyperplasitic  polyp.  . Ercp w/ sphincterotomy and balloon dilation  08/05/2010    normal ampulla/mild erythema in the gastric remnant without ulcerations or erosions. normal retroflexed view of the cardia/anastomosis normal/distal common bile duct stones s/p extraction and shincterotomy  . Esophagogastroduodenoscopy N/A 10/24/2013     [id number] esophagus. Surgically altered stomach. Abnormal gastric mucosa  -  status post biopsy (reactive gastrophathy)  . Eus N/A 08/20/2015    Dr. Ardis Hughs: well-circumscribed 5.2 cm chronic mass in porta hepatis, internally mass is gelatinous. Small distal esophageal varices (limited view), Bilroth 1 type anatomy, cytology NEGATIVE for malignant cells. Suspicion for neoplasm low     Current Outpatient Prescriptions  Medication Sig Dispense Refill  . calcium-vitamin D (OSCAL WITH D) 500-200 MG-UNIT tablet Take 1 tablet by mouth 2 (two) times daily.    Marland Kitchen FLUoxetine (PROZAC) 40 MG capsule Take 40 mg by mouth daily.     Marland Kitchen LINZESS 290 MCG CAPS capsule TAKE ONE CAPSULE BY MOUTH DAILY 30 MINUTES BEFORE BREAKFAST. 30 capsule 11  . ondansetron (ZOFRAN) 8 MG tablet Take 1 tablet (8 mg total) by mouth every 4 (four) hours as needed. 8 tablet 0  . oxyCODONE-acetaminophen (PERCOCET) 5-325 MG tablet Take 1-2 tablets by mouth every 4 (four) hours as needed. 20 tablet 0  . pantoprazole (PROTONIX) 40 MG tablet Take 1 tablet (40 mg total) by mouth daily. 30 tablet 12  . risperiDONE (RISPERDAL) 2 MG tablet Take 2 mg by mouth at bedtime.    . sucralfate (CARAFATE) 1 GM/10ML suspension Take 10 mLs (1 g total) by mouth 3 (three) times daily with meals as needed. 420 mL 1  . traZODone (DESYREL) 150 MG tablet Take 300 mg by mouth at bedtime.     No current facility-administered medications for this visit.    Allergies as of 11/04/2015 - Review Complete 11/04/2015  Allergen Reaction Noted  . Ciprofloxacin Rash     Family History  Problem Relation Age of Onset  . Colon cancer Maternal Uncle 51  . Diabetes Father   . Liver disease Neg Hx     Social History   Social History  . Marital Status: Divorced    Spouse Name: N/A  . Number of Children: 2  . Years of Education: N/A   Occupational History  . disabled    Social History Main Topics  . Smoking status: Former Smoker -- 1.00 packs/day for 30 years     Types: Cigarettes    Quit date: 10/21/2015  . Smokeless tobacco: Never Used  . Alcohol Use: No  . Drug Use: No  . Sexual Activity: No   Other Topics Concern  . None   Social History Narrative   Lives w/ mother   2 grown children    Review of Systems: General: Negative for anorexia, weight loss, fever, chills, fatigue, weakness. ENT: Negative for hoarseness, difficulty swallowing. CV: Negative for chest pain, angina, palpitations, peripheral edema.  Respiratory: Negative for dyspnea at rest, cough, sputum, wheezing.  GI: See history of present illness. Endo: Negative for unusual weight change.  Heme: Negative for bruising or bleeding.   Physical Exam: BP 102/64 mmHg  Pulse 63  Temp(Src) 97.2 F (36.2 C)  Ht 5\' 4"  (1.626 m)  Wt 177 lb 3.2 oz (80.377 kg)  BMI 30.40 kg/m2 General:   Alert and oriented. Pleasant and cooperative. Well-nourished and well-developed.  Head:  Normocephalic and atraumatic. Eyes:  Without icterus, sclera clear and conjunctiva  pink.  Cardiovascular:  S1, S2 present without murmurs appreciated. Extremities without clubbing or edema. Respiratory:  Clear to auscultation bilaterally. No wheezes, rales, or rhonchi. No distress.  Gastrointestinal:  +BS, rounded but soft, non-tender and non-distended. No guarding or rebound. No masses appreciated. No jaundice appreciated. Rectal:  Deferred  Musculoskalatal:  Symmetrical without gross deformities. Skin:  Intact without significant lesions or rashes. Neurologic:  Alert and oriented x4;  grossly normal neurologically. Psych:  Alert and cooperative. Normal mood and affect. Heme/Lymph/Immune: No excessive bruising noted.    11/04/2015 8:17 AM   Disclaimer: This note was dictated with voice recognition software. Similar sounding words can inadvertently be transcribed and may not be corrected upon review.

## 2015-11-04 NOTE — Assessment & Plan Note (Signed)
Currently asymptomatic. We will refill her Zofran 4 as needed nausea medication. Return for follow-up as needed with surgeon versus gastroenterology.

## 2015-11-09 ENCOUNTER — Other Ambulatory Visit (HOSPITAL_COMMUNITY): Payer: Self-pay | Admitting: Pulmonary Disease

## 2015-11-09 DIAGNOSIS — Z780 Asymptomatic menopausal state: Secondary | ICD-10-CM

## 2015-11-09 DIAGNOSIS — Z0000 Encounter for general adult medical examination without abnormal findings: Secondary | ICD-10-CM | POA: Diagnosis not present

## 2015-11-09 DIAGNOSIS — F17200 Nicotine dependence, unspecified, uncomplicated: Secondary | ICD-10-CM | POA: Diagnosis not present

## 2015-11-09 DIAGNOSIS — Z23 Encounter for immunization: Secondary | ICD-10-CM | POA: Diagnosis not present

## 2015-11-10 DIAGNOSIS — H2513 Age-related nuclear cataract, bilateral: Secondary | ICD-10-CM | POA: Diagnosis not present

## 2015-11-11 ENCOUNTER — Ambulatory Visit (HOSPITAL_COMMUNITY)
Admission: RE | Admit: 2015-11-11 | Discharge: 2015-11-11 | Disposition: A | Discharge status: Home / Self Care | Payer: Medicare Other | Source: Ambulatory Visit | Attending: Pulmonary Disease | Admitting: Pulmonary Disease

## 2015-11-11 DIAGNOSIS — Z780 Asymptomatic menopausal state: Secondary | ICD-10-CM | POA: Diagnosis not present

## 2015-11-11 DIAGNOSIS — M85852 Other specified disorders of bone density and structure, left thigh: Secondary | ICD-10-CM | POA: Insufficient documentation

## 2015-11-11 DIAGNOSIS — M8589 Other specified disorders of bone density and structure, multiple sites: Secondary | ICD-10-CM | POA: Diagnosis not present

## 2015-11-24 NOTE — Patient Instructions (Signed)
YOLANDE OFFERMAN  11/24/2015     @PREFPERIOPPHARMACY @   Your procedure is scheduled on  11/30/2015   Report to [facility] at  615  A.M.  Call this number if you have problems the morning of surgery:  [phone]   Remember:  Do not eat food or drink liquids after midnight.  Take these medicines the morning of surgery with A SIP OF WATER  Prozac, oxycodone, protonix.   Do not wear jewelry, make-up or nail polish.  Do not wear lotions, powders, or perfumes.  You may wear deodorant.  Do not shave 48 hours prior to surgery.  Men may shave face and neck.  Do not bring valuables to the hospital.  [facility] is not responsible for any belongings or valuables.  Contacts, dentures or bridgework may not be worn into surgery.  Leave your suitcase in the car.  After surgery it may be brought to your room.  For patients admitted to the hospital, discharge time will be determined by your treatment team.  Patients discharged the day of surgery will not be allowed to drive home.   Name and phone number of your driver:   family Special instructions:  Follow the diet instructions given to you by Dr Roseanne Kaufman office.  Please read over the following fact sheets that you were given. Coughing and Deep Breathing, Surgical Site Infection Prevention, Anesthesia Post-op Instructions and Care and Recovery After Surgery      Esophagogastroduodenoscopy Esophagogastroduodenoscopy (EGD) is a procedure that is used to examine the lining of the esophagus, stomach, and first part of the small intestine (duodenum). A long, flexible, lighted tube with a camera attached (endoscope) is inserted down the throat to view these organs. This procedure is done to detect problems or abnormalities, such as inflammation, bleeding, ulcers, or growths, in order to treat them. The procedure lasts 5-20 minutes. It is usually an outpatient procedure, but it may need to be performed in a hospital in emergency  cases. LET [facility] CARE PROVIDER KNOW ABOUT:  Any allergies you have.  All medicines you are taking, including vitamins, herbs, eye drops, creams, and over-the-counter medicines.  Previous problems you or members of your family have had with the use of anesthetics.  Any blood disorders you have.  Previous surgeries you have had.  Medical conditions you have. RISKS AND COMPLICATIONS Generally, this is a safe procedure. However, problems can occur and include:  Infection.  Bleeding.  Tearing (perforation) of the esophagus, stomach, or duodenum.  Difficulty breathing or not being able to breathe.  Excessive sweating.  Spasms of the larynx.  Slowed heartbeat.  Low blood pressure. BEFORE THE PROCEDURE  Do not eat or drink anything after midnight on the night before the procedure or as directed by your health care provider.  Do not take your regular medicines before the procedure if your health care provider asks you not to. Ask your health care provider about changing or stopping those medicines.  If you wear dentures, be prepared to remove them before the procedure.  Arrange for someone to drive you home after the procedure. PROCEDURE  A numbing medicine (local anesthetic) may be sprayed in your throat for comfort and to stop you from gagging or coughing.  You will have an IV tube inserted in a vein in your hand or arm. You will receive medicines and fluids through this tube.  You will be given a medicine to relax you (sedative).  A pain reliever will be given through the IV tube.  A mouth guard may be placed in your mouth to protect your teeth and to keep you from biting on the endoscope.  You will be asked to lie on your left side.  The endoscope will be inserted down your throat and into your esophagus, stomach, and duodenum.  Air will be put through the endoscope to allow your health care provider to clearly view the lining of your esophagus.  The lining  of your esophagus, stomach, and duodenum will be examined. During the exam, your health care provider may:  Remove tissue to be examined under a microscope (biopsy) for inflammation, infection, or other medical problems.  Remove growths.  Remove objects (foreign bodies) that are stuck.  Treat any bleeding with medicines or other devices that stop tissues from bleeding (hot cautery, clipping devices).  Widen (dilate) or stretch narrowed areas of your esophagus and stomach.  The endoscope will be withdrawn. AFTER THE PROCEDURE  You will be taken to a recovery area for observation. Your blood pressure, heart rate, breathing rate, and blood oxygen level will be monitored often until the medicines you were given have worn off.  Do not eat or drink anything until the numbing medicine has worn off and your gag reflex has returned. You may choke.  Your health care provider should be able to discuss his or her findings with you. It will take longer to discuss the test results if any biopsies were taken.   This information is not intended to replace advice given to you by your health care provider. Make sure you discuss any questions you have with your health care provider.   Document Released: 12/23/2004 Document Revised: 09/12/2014 Document Reviewed: 07/25/2012 Elsevier Interactive Patient Education 2016 [employer]. Esophagogastroduodenoscopy, Care After Refer to this sheet in the next few weeks. These instructions provide you with information about caring for yourself after your procedure. Your health care provider may also give you more specific instructions. Your treatment has been planned according to current medical practices, but problems sometimes occur. Call your health care provider if you have any problems or questions after your procedure. WHAT TO EXPECT AFTER THE PROCEDURE After your procedure, it is typical to feel:  Soreness in your throat.  Pain with swallowing.  Sick to  your stomach (nauseous).  Bloated.  Dizzy.  Fatigued. HOME CARE INSTRUCTIONS  Do not eat or drink anything until the numbing medicine (local anesthetic) has worn off and your gag reflex has returned. You will know that the local anesthetic has worn off when you can swallow comfortably.  Do not drive or operate machinery until directed by your health care provider.  Take medicines only as directed by your health care provider. SEEK MEDICAL CARE IF:   You cannot stop coughing.  You are not urinating at all or less than usual. SEEK IMMEDIATE MEDICAL CARE IF:  You have difficulty swallowing.  You cannot eat or drink.  You have worsening throat or chest pain.  You have dizziness or lightheadedness or you faint.  You have nausea or vomiting.  You have chills.  You have a fever.  You have severe abdominal pain.  You have black, tarry, or bloody stools.   This information is not intended to replace advice given to you by your health care provider. Make sure you discuss any questions you have with your health care provider.   Document Released: 08/08/2012 Document Revised: 09/12/2014 Document Reviewed: 08/08/2012 Elsevier Interactive  Patient Education 2016 [employer] PATIENT INSTRUCTIONS POST-ANESTHESIA  IMMEDIATELY FOLLOWING SURGERY:  Do not drive or operate machinery for the first twenty four hours after surgery.  Do not make any important decisions for twenty four hours after surgery or while taking narcotic pain medications or sedatives.  If you develop intractable nausea and vomiting or a severe headache please notify your doctor immediately.  FOLLOW-UP:  Please make an appointment with your surgeon as instructed. You do not need to follow up with anesthesia unless specifically instructed to do so.  WOUND CARE INSTRUCTIONS (if applicable):  Keep a dry clean dressing on the anesthesia/puncture wound site if there is drainage.  Once the wound has quit draining you may  leave it open to air.  Generally you should leave the bandage intact for twenty four hours unless there is drainage.  If the epidural site drains for more than 36-48 hours please call the anesthesia department.  QUESTIONS?:  Please feel free to call your physician or the hospital operator if you have any questions, and they will be happy to assist you.

## 2015-11-25 ENCOUNTER — Encounter (HOSPITAL_COMMUNITY): Payer: Self-pay

## 2015-11-25 ENCOUNTER — Encounter (HOSPITAL_COMMUNITY)
Admission: RE | Admit: 2015-11-25 | Discharge: 2015-11-25 | Disposition: A | Discharge status: Home / Self Care | Payer: Medicare Other | Source: Ambulatory Visit | Attending: Internal Medicine | Admitting: Internal Medicine

## 2015-11-25 DIAGNOSIS — Z01812 Encounter for preprocedural laboratory examination: Secondary | ICD-10-CM | POA: Insufficient documentation

## 2015-11-25 DIAGNOSIS — Z01810 Encounter for preprocedural cardiovascular examination: Secondary | ICD-10-CM | POA: Insufficient documentation

## 2015-11-25 LAB — CBC WITH DIFFERENTIAL/PLATELET
Basophils Absolute: 0 10*3/uL (ref 0.0–0.1)
Basophils Relative: 0 %
Eosinophils Absolute: 0.2 10*3/uL (ref 0.0–0.7)
Eosinophils Relative: 2 %
HCT: 40 % (ref 36.0–46.0)
Hemoglobin: 12.9 g/dL (ref 12.0–15.0)
Lymphocytes Relative: 26 %
Lymphs Abs: 2.1 10*3/uL (ref 0.7–4.0)
MCH: 28.7 pg (ref 26.0–34.0)
MCHC: 32.3 g/dL (ref 30.0–36.0)
MCV: 89.1 fL (ref 78.0–100.0)
Monocytes Absolute: 0.7 10*3/uL (ref 0.1–1.0)
Monocytes Relative: 9 %
Neutro Abs: 4.8 10*3/uL (ref 1.7–7.7)
Neutrophils Relative %: 63 %
Platelets: 372 10*3/uL (ref 150–400)
RBC: 4.49 MIL/uL (ref 3.87–5.11)
RDW: 13.4 % (ref 11.5–15.5)
WBC: 7.8 10*3/uL (ref 4.0–10.5)

## 2015-11-25 LAB — BASIC METABOLIC PANEL
Anion gap: 6 (ref 5–15)
BUN: 11 mg/dL (ref 6–20)
CO2: 26 mmol/L (ref 22–32)
Calcium: 8.9 mg/dL (ref 8.9–10.3)
Chloride: 107 mmol/L (ref 101–111)
Creatinine, Ser: 1.02 mg/dL — ABNORMAL HIGH (ref 0.44–1.00)
GFR calc Af Amer: >60 mL/min (ref >60)
GFR calc non Af Amer: 59 mL/min — ABNORMAL LOW (ref >60)
Glucose, Bld: 86 mg/dL (ref 65–99)
Potassium: 3.9 mmol/L (ref 3.5–5.1)
Sodium: 139 mmol/L (ref 135–145)

## 2015-11-30 ENCOUNTER — Encounter (HOSPITAL_COMMUNITY)
Admission: RE | Disposition: A | Discharge status: Home / Self Care | Payer: Self-pay | Source: Ambulatory Visit | Attending: Internal Medicine

## 2015-11-30 ENCOUNTER — Ambulatory Visit (HOSPITAL_COMMUNITY): Payer: Medicare Other | Admitting: Anesthesiology

## 2015-11-30 ENCOUNTER — Encounter (HOSPITAL_COMMUNITY): Payer: Self-pay

## 2015-11-30 ENCOUNTER — Ambulatory Visit (HOSPITAL_COMMUNITY)
Admission: RE | Admit: 2015-11-30 | Discharge: 2015-11-30 | Disposition: A | Discharge status: Home / Self Care | Payer: Medicare Other | Source: Ambulatory Visit | Attending: Internal Medicine | Admitting: Internal Medicine

## 2015-11-30 DIAGNOSIS — F329 Major depressive disorder, single episode, unspecified: Secondary | ICD-10-CM | POA: Diagnosis not present

## 2015-11-30 DIAGNOSIS — K210 Gastro-esophageal reflux disease with esophagitis: Secondary | ICD-10-CM | POA: Diagnosis not present

## 2015-11-30 DIAGNOSIS — Z79899 Other long term (current) drug therapy: Secondary | ICD-10-CM | POA: Insufficient documentation

## 2015-11-30 DIAGNOSIS — Z13810 Encounter for screening for upper gastrointestinal disorder: Secondary | ICD-10-CM | POA: Insufficient documentation

## 2015-11-30 DIAGNOSIS — R112 Nausea with vomiting, unspecified: Secondary | ICD-10-CM | POA: Diagnosis not present

## 2015-11-30 DIAGNOSIS — Z87891 Personal history of nicotine dependence: Secondary | ICD-10-CM | POA: Insufficient documentation

## 2015-11-30 DIAGNOSIS — F259 Schizoaffective disorder, unspecified: Secondary | ICD-10-CM | POA: Insufficient documentation

## 2015-11-30 DIAGNOSIS — Z6830 Body mass index (BMI) 30.0-30.9, adult: Secondary | ICD-10-CM | POA: Insufficient documentation

## 2015-11-30 DIAGNOSIS — E669 Obesity, unspecified: Secondary | ICD-10-CM | POA: Diagnosis not present

## 2015-11-30 DIAGNOSIS — Z86010 Personal history of colonic polyps: Secondary | ICD-10-CM | POA: Diagnosis not present

## 2015-11-30 DIAGNOSIS — Z9884 Bariatric surgery status: Secondary | ICD-10-CM | POA: Diagnosis not present

## 2015-11-30 DIAGNOSIS — K7460 Unspecified cirrhosis of liver: Secondary | ICD-10-CM | POA: Diagnosis not present

## 2015-11-30 DIAGNOSIS — K3189 Other diseases of stomach and duodenum: Secondary | ICD-10-CM | POA: Insufficient documentation

## 2015-11-30 HISTORY — PX: ESOPHAGOGASTRODUODENOSCOPY (EGD) WITH PROPOFOL: SHX5813

## 2015-11-30 SURGERY — ESOPHAGOGASTRODUODENOSCOPY (EGD) WITH PROPOFOL
Anesthesia: Monitor Anesthesia Care

## 2015-11-30 MED ORDER — PROPOFOL 10 MG/ML IV BOLUS
INTRAVENOUS | Status: AC
  Filled 2015-11-30: qty 40

## 2015-11-30 MED ORDER — FENTANYL CITRATE (PF) 100 MCG/2ML IJ SOLN
INTRAMUSCULAR | Status: AC
  Filled 2015-11-30: qty 2

## 2015-11-30 MED ORDER — FENTANYL CITRATE (PF) 100 MCG/2ML IJ SOLN
25.0000 ug | INTRAMUSCULAR | Status: AC
  Administered 2015-11-30 (×2): 25 ug via INTRAVENOUS

## 2015-11-30 MED ORDER — PROPOFOL 500 MG/50ML IV EMUL
INTRAVENOUS | Status: DC | PRN
  Administered 2015-11-30: 125 ug/kg/min via INTRAVENOUS

## 2015-11-30 MED ORDER — FENTANYL CITRATE (PF) 100 MCG/2ML IJ SOLN
25.0000 ug | INTRAMUSCULAR | Status: DC | PRN
Start: 2015-11-30 — End: 2015-11-30

## 2015-11-30 MED ORDER — MIDAZOLAM HCL 2 MG/2ML IJ SOLN
INTRAMUSCULAR | Status: AC
  Filled 2015-11-30: qty 2

## 2015-11-30 MED ORDER — LIDOCAINE VISCOUS 2 % MT SOLN
15.0000 mL | Freq: Once | OROMUCOSAL | Status: AC
  Administered 2015-11-30: 15 mL via OROMUCOSAL

## 2015-11-30 MED ORDER — LACTATED RINGERS IV SOLN
INTRAVENOUS | Status: DC
  Administered 2015-11-30: 07:00:00 via INTRAVENOUS

## 2015-11-30 MED ORDER — ONDANSETRON HCL 4 MG/2ML IJ SOLN
4.0000 mg | Freq: Once | INTRAMUSCULAR | Status: AC
  Administered 2015-11-30: 4 mg via INTRAVENOUS

## 2015-11-30 MED ORDER — ONDANSETRON HCL 4 MG/2ML IJ SOLN
INTRAMUSCULAR | Status: AC
  Filled 2015-11-30: qty 2

## 2015-11-30 MED ORDER — LIDOCAINE VISCOUS 2 % MT SOLN
OROMUCOSAL | Status: AC
  Filled 2015-11-30: qty 15

## 2015-11-30 MED ORDER — MIDAZOLAM HCL 2 MG/2ML IJ SOLN
1.0000 mg | INTRAMUSCULAR | Status: DC | PRN
  Administered 2015-11-30: 2 mg via INTRAVENOUS

## 2015-11-30 MED ORDER — ONDANSETRON HCL 4 MG/2ML IJ SOLN: 4.0000 mg | Freq: Once | INTRAMUSCULAR | Status: DC | PRN

## 2015-11-30 NOTE — Transfer of Care (Signed)
Immediate Anesthesia Transfer of Care Note  Patient: [name]  Procedure(s) Performed: Procedure(s) with comments: ESOPHAGOGASTRODUODENOSCOPY (EGD) WITH PROPOFOL (N/A) - 730   Patient Location: PACU  Anesthesia Type:MAC  Level of Consciousness: awake  Airway & Oxygen Therapy: Patient Spontanous Breathing and Patient connected to nasal cannula oxygen  Post-op Assessment: Report given to RN  Post vital signs: Reviewed  Last Vitals:  Filed Vitals:   11/30/15 0725 11/30/15 0730  BP: 108/65 118/69  Pulse:    Temp:    Resp: 20 21    Complications: No apparent anesthesia complications

## 2015-11-30 NOTE — Discharge Instructions (Signed)
EGD Discharge instructions Please read the instructions outlined below and refer to this sheet in the next few weeks. These discharge instructions provide you with general information on caring for yourself after you leave the hospital. Your doctor may also give you specific instructions. While your treatment has been planned according to the most current medical practices available, unavoidable complications occasionally occur. If you have any problems or questions after discharge, please call your doctor. ACTIVITY  You may resume your regular activity but move at a slower pace for the next 24 hours.   Take frequent rest periods for the next 24 hours.   Walking will help expel (get rid of) the air and reduce the bloated feeling in your abdomen.   No driving for 24 hours (because of the anesthesia (medicine) used during the test).   You may shower.   Do not sign any important legal documents or operate any machinery for 24 hours (because of the anesthesia used during the test).  NUTRITION  Drink plenty of fluids.   You may resume your normal diet.   Begin with a light meal and progress to your normal diet.   Avoid alcoholic beverages for 24 hours or as instructed by your caregiver.  MEDICATIONS  You may resume your normal medications unless your caregiver tells you otherwise.  WHAT YOU CAN EXPECT TODAY  You may experience abdominal discomfort such as a feeling of fullness or gas pains.  FOLLOW-UP  Your doctor will discuss the results of your test with you.  SEEK IMMEDIATE MEDICAL ATTENTION IF ANY OF THE FOLLOWING OCCUR:  Excessive nausea (feeling sick to your stomach) and/or vomiting.   Severe abdominal pain and distention (swelling).   Trouble swallowing.   Temperature over 101 F (37.8 C).   Rectal bleeding or vomiting of blood.    GERD information provided  Continue Protonix 40 mg daily  Repeat EGD in 2 years  Follow-up Dr. Ardis Hughs as previously  instructed  Gastroesophageal Reflux Disease, Adult Normally, food travels down the esophagus and stays in the stomach to be digested. However, when a person has gastroesophageal reflux disease (GERD), food and stomach acid move back up into the esophagus. When this happens, the esophagus becomes sore and inflamed. Over time, GERD can create small holes (ulcers) in the lining of the esophagus.  CAUSES This condition is caused by a problem with the muscle between the esophagus and the stomach (lower esophageal sphincter, or LES). Normally, the LES muscle closes after food passes through the esophagus to the stomach. When the LES is weakened or abnormal, it does not close properly, and that allows food and stomach acid to go back up into the esophagus. The LES can be weakened by certain dietary substances, medicines, and medical conditions, including:  Tobacco use.  Pregnancy.  Having a hiatal hernia.  Heavy alcohol use.  Certain foods and beverages, such as coffee, chocolate, onions, and peppermint. RISK FACTORS This condition is more likely to develop in:  People who have an increased body weight.  People who have connective tissue disorders.  People who use NSAID medicines. SYMPTOMS Symptoms of this condition include:  Heartburn.  Difficult or painful swallowing.  The feeling of having a lump in the throat.  Abitter taste in the mouth.  Bad breath.  Having a large amount of saliva.  Having an upset or bloated stomach.  Belching.  Chest pain.  Shortness of breath or wheezing.  Ongoing (chronic) cough or a night-time cough.  Wearing away of  tooth enamel.  Weight loss. Different conditions can cause chest pain. Make sure to see your health care provider if you experience chest pain. DIAGNOSIS Your health care provider will take a medical history and perform a physical exam. To determine if you have mild or severe GERD, your health care provider may also monitor how  you respond to treatment. You may also have other tests, including:  An endoscopy toexamine your stomach and esophagus with a small camera.  A test thatmeasures the acidity level in your esophagus.  A test thatmeasures how much pressure is on your esophagus.  A barium swallow or modified barium swallow to show the shape, size, and functioning of your esophagus. TREATMENT The goal of treatment is to help relieve your symptoms and to prevent complications. Treatment for this condition may vary depending on how severe your symptoms are. Your health care provider may recommend:  Changes to your diet.  Medicine.  Surgery. HOME CARE INSTRUCTIONS Diet  Follow a diet as recommended by your health care provider. This may involve avoiding foods and drinks such as:  Coffee and tea (with or without caffeine).  Drinks that containalcohol.  Energy drinks and sports drinks.  Carbonated drinks or sodas.  Chocolate and cocoa.  Peppermint and mint flavorings.  Garlic and onions.  Horseradish.  Spicy and acidic foods, including peppers, chili powder, curry powder, vinegar, hot sauces, and barbecue sauce.  Citrus fruit juices and citrus fruits, such as oranges, lemons, and limes.  Tomato-based foods, such as red sauce, chili, salsa, and pizza with red sauce.  Fried and fatty foods, such as donuts, french fries, potato chips, and high-fat dressings.  High-fat meats, such as hot dogs and fatty cuts of red and white meats, such as rib eye steak, sausage, ham, and bacon.  High-fat dairy items, such as whole milk, butter, and cream cheese.  Eat small, frequent meals instead of large meals.  Avoid drinking large amounts of liquid with your meals.  Avoid eating meals during the 2-3 hours before bedtime.  Avoid lying down right after you eat.  Do not exercise right after you eat. General Instructions  Pay attention to any changes in your symptoms.  Take over-the-counter and  prescription medicines only as told by your health care provider. Do not take aspirin, ibuprofen, or other NSAIDs unless your health care provider told you to do so.  Do not use any tobacco products, including cigarettes, chewing tobacco, and e-cigarettes. If you need help quitting, ask your health care provider.  Wear loose-fitting clothing. Do not wear anything tight around your waist that causes pressure on your abdomen.  Raise (elevate) the head of your bed 6 inches (15cm).  Try to reduce your stress, such as with yoga or meditation. If you need help reducing stress, ask your health care provider.  If you are overweight, reduce your weight to an amount that is healthy for you. Ask your health care provider for guidance about a safe weight loss goal.  Keep all follow-up visits as told by your health care provider. This is important. SEEK MEDICAL CARE IF:  You have new symptoms.  You have unexplained weight loss.  You have difficulty swallowing, or it hurts to swallow.  You have wheezing or a persistent cough.  Your symptoms do not improve with treatment.  You have a hoarse voice. SEEK IMMEDIATE MEDICAL CARE IF:  You have pain in your arms, neck, jaw, teeth, or back.  You feel sweaty, dizzy, or light-headed.  You  have chest pain or shortness of breath.  You vomit and your vomit looks like blood or coffee grounds.  You faint.  Your stool is bloody or black.  You cannot swallow, drink, or eat.   This information is not intended to replace advice given to you by your health care provider. Make sure you discuss any questions you have with your health care provider.   Document Released: 06/01/2005 Document Revised: 05/13/2015 Document Reviewed: 12/17/2014 Elsevier Interactive Patient Education [employer].

## 2015-11-30 NOTE — Anesthesia Preprocedure Evaluation (Signed)
Anesthesia Evaluation  Patient identified by MRN, date of birth, ID band Patient awake    Reviewed: Allergy & Precautions, H&P , NPO status , Patient's Chart, lab work & pertinent test results  History of Anesthesia Complications Negative for: history of anesthetic complications  Airway Mallampati: I  TM Distance: >3 FB Neck ROM: full    Dental  (+) Teeth Intact   Pulmonary neg pulmonary ROS, former smoker,    Pulmonary exam normal breath sounds clear to auscultation       Cardiovascular negative cardio ROS Normal cardiovascular exam Rhythm:regular Rate:Normal     Neuro/Psych PSYCHIATRIC DISORDERS Depression Schizophrenia negative neurological ROS     GI/Hepatic PUD, (+) Cirrhosis  (Liver Mass)      ,   Endo/Other  negative endocrine ROS  Renal/GU negative Renal ROS     Musculoskeletal   Abdominal   Peds  Hematology negative hematology ROS (+)   Anesthesia Other Findings   Reproductive/Obstetrics negative OB ROS                             Anesthesia Physical Anesthesia Plan  ASA: III  Anesthesia Plan: MAC   Post-op Pain Management:    Induction: Intravenous  Airway Management Planned: Simple Face Mask  Additional Equipment:   Intra-op Plan:   Post-operative Plan:   Informed Consent: I have reviewed the patients History and Physical, chart, labs and discussed the procedure including the risks, benefits and alternatives for the proposed anesthesia with the patient or authorized representative who has indicated his/her understanding and acceptance.     Plan Discussed with:   Anesthesia Plan Comments:         Anesthesia Quick Evaluation

## 2015-11-30 NOTE — Op Note (Signed)
[facility] Patient Name: [name] Procedure Date: 11/30/2015 7:41 AM MRN: [id number] Date of Birth: 1956/03/23 Attending MD: Norvel Richards , MD CSN: [id number] Age: 60 Admit Type: Outpatient Procedure:                Upper GI endoscopy Indications:              Screening procedure Providers:                Norvel Richards, MD, Janeece Riggers, RN, Georgeann Oppenheim, Technician Referring MD:             Campbell Lerner (Referring MD) Medicines:                Monitored Anesthesia Care Complications:            No immediate complications. Estimated Blood Loss:     Estimated blood loss: none. Procedure:                Pre-Anesthesia Assessment:                           - Prior to the procedure, a History and Physical                            was performed, and patient medications and                            allergies were reviewed. The patient's tolerance of                            previous anesthesia was also reviewed. The risks                            and benefits of the procedure and the sedation                            options and risks were discussed with the patient.                            All questions were answered, and informed consent                            was obtained. Prior Anticoagulants: The patient has                            taken no previous anticoagulant or antiplatelet                            agents. ASA Grade Assessment: III - A patient with                            severe systemic disease. After reviewing the risks  and benefits, the patient was deemed in                            satisfactory condition to undergo the procedure.                           After obtaining informed consent, the endoscope was                            passed under direct vision. Throughout the                            procedure, the patient's blood pressure, pulse, and                oxygen saturations were monitored continuously. The                            [id number]) scope was introduced through the                            mouth, with the intention of advancing to the                            duodenum. The scope was advanced to the second part                            of the duodenum before the procedure was aborted.                            Medications were not given. The upper GI endoscopy                            was accomplished without difficulty. The patient                            tolerated the procedure well. Scope In: 7:51:58 AM Scope Out: 7:57:57 AM Total Procedure Duration: 0 hours 5 minutes 59 seconds  Findings:      LA Grade A (one or more mucosal breaks less than 5 mm, not extending       between tops of 2 mucosal folds) esophagitis was found 35 to 36 cm from       the incisors. Estimated blood loss: none. No esophageal varices or other       abnormality.      Surgically altered stomach. 2 lm entering stomach and a possible small       sequestered segment of stomach with a pinpoint opening noted.       normal-appearing residual gastric mucosa.      Small bowel limb appeared normal. Impression:               - LA Grade A reflux esophagitis. No varices.                           - Surgically altered stomach as described                           -  No specimens collected. Moderate Sedation:      Moderate (conscious) sedation was personally administered by an       anesthesia professional. The following parameters were monitored: oxygen       saturation, heart rate, blood pressure, respiratory rate, EKG, adequacy       of pulmonary ventilation, and response to care. Total physician       intraservice time was 15 minutes. Recommendation:           - Patient has a contact number available for                            emergencies. The signs and symptoms of potential                            delayed complications  were discussed with the                            patient. Return to normal activities tomorrow.                            Written discharge instructions were provided to the                            patient.                           - Advance diet as tolerated today.                           - Continue present medications.                           - Return to GI clinic in 6 months. Follow-up with                            Dr. Ardis Hughs regarding porta hepatis lesion. Repeat                            screening EGD in 2 years. Procedure Code(s):        --- Professional ---                           [phone], 52, Esophagogastroduodenoscopy, flexible,                            transoral; diagnostic, including collection of                            specimen(s) by brushing or washing, when performed                            (separate procedure) Diagnosis Code(s):        --- Professional ---                           K21.0, Gastro-esophageal reflux disease with  esophagitis                           Z13.810, Encounter for screening for upper                            gastrointestinal disorder CPT copyright 2016 American Medical Association. All rights reserved. The codes documented in this report are preliminary and upon coder review may  be revised to meet current compliance requirements. [name], MD Norvel Richards, MD 11/30/2015 8:13:37 AM This report has been signed electronically. Number of Addenda: 0

## 2015-11-30 NOTE — Anesthesia Postprocedure Evaluation (Signed)
Anesthesia Post Note  Patient: [name]  Procedure(s) Performed: Procedure(s) (LRB): ESOPHAGOGASTRODUODENOSCOPY (EGD) WITH PROPOFOL (N/A)  Patient location during evaluation: PACU Anesthesia Type: General Level of consciousness: awake and alert and oriented Pain management: pain level controlled Vital Signs Assessment: post-procedure vital signs reviewed and stable Respiratory status: spontaneous breathing Cardiovascular status: blood pressure returned to baseline Postop Assessment: no signs of nausea or vomiting Anesthetic complications: no    Last Vitals:  Filed Vitals:   11/30/15 0725 11/30/15 0730  BP: 108/65 118/69  Pulse:    Temp:    Resp: 20 21    Last Pain: There were no vitals filed for this visit.               Kayle Passarelli

## 2015-11-30 NOTE — H&P (View-Only) (Signed)
Referring Provider: Sinda Du, MD Primary Care Physician:  Alonza Bogus, MD Primary GI:  Dr. Gala Romney  Chief Complaint  Patient presents with  . Follow-up    HPI:   [name] is a 60 y.o. female who presents for follow-up on abdominal pain and cirrhosis. She was last seen in our office 09/17/2015 for abdominal pain, abdominal mass, cirrhosis, nausea. At that time showed well compensated cirrhosis but noted stable intra-abdominal lesion on imaging. Underwent EUS for this mass by Dr. Ardis Hughs for abdominal pain and CT at that time showed retroperitoneal air likely sequela of recent procedure. 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 Preliminary cytology review shows cyst contents. Low suspicion for neoplasm. She was referred to surgery and she saw them 06/24/2016. They noted the well-circumscribed mass in the porta hepatis goes back in 2011 and has grown since that time from 4+ centimeters to 5.2 cm. FNA and results significant gelatinous material without cellular contents. History of cirrhosis but stopped drinking. Noted significant decrease in size of the gas and fluid containing lesion, less pain and nausea, improvement with antireflux measures. At that time denied nausea, vomiting, weight loss. Impression included decreased size of gas and fluid lesion superior to the pancreas and resolution of gas and fluid collection inferior to the jejunal mesentery with mild to moderate residual inflammatory stranding in this region. Recommended 6 month follow-up with them due to minimally symptomatic at that time.  Last EGD completed 10/24/2013 to screen for esophageal varices which found normal esophagus, surgically altered stomach, abnormal gastric mucosa status post biopsy. Biopsy found reactive gastropathy with mild  chronic inflammation and without H. pylori. Recommend repeat screening in 2 years which is currently due.  Today she states she was in the ER on the 18th for abdominal pain and vomiting. She was treated and discharged by the ER with recommendations to follow-up with surgeon, which she states she did not do. Denies abdominal pain, N/V since then. "I feel much better." Denies yellowing of skin/eyes, darkened urine, acute episodic confusion, excessive bleeding/bruising. Denies fever, chills, has lost 5 lbs in the past 2 months not trying. Appetite is ok. Denies hematochezia, melena, changes in bowel movements.  Last colonoscopy about 8 years ago which found 3 diminutive polyps clustered together which were removed, left-sided diverticula. Polyps found to be hyperplastic.  Past Medical History  Diagnosis Date  . Depression   . Chronic constipation   . Diverticulosis 01/31/2008    colonoscopy Dr Gala Romney  . Obesity   . Schizoaffective disorder     Mental Health-Dr Elias Else  . PUD (peptic ulcer disease) 1988  . Colonic inertia   . Hyperplastic colon polyp   . Diverticula of colon   . Cirrhosis ([place])     child pugh class A and MELD 5, completed hep A and B vaccine series, no HCC on 3/16 MRI  . Liver mass     Past Surgical History  Procedure Laterality Date  . Tonsillectomy    . S/p hysterectomy      partial  . Gastric bypass      with Revision, Dr. Duffy Rhody  . Wrist surgery    . Foot surgery      left  . Cholecystectomy  08/2010    cholelithiasis; Dr Arnoldo Morale  . Colonoscopy  01/31/2008    Dr. Gala Romney- normal  rectum, L sided diverticula, long tortuous colon. hyperplasitic  polyp.  . Ercp w/ sphincterotomy and balloon dilation  08/05/2010    normal ampulla/mild erythema in the gastric remnant without ulcerations or erosions. normal retroflexed view of the cardia/anastomosis normal/distal common bile duct stones s/p extraction and shincterotomy  . Esophagogastroduodenoscopy N/A 10/24/2013     [id number] esophagus. Surgically altered stomach. Abnormal gastric mucosa  -  status post biopsy (reactive gastrophathy)  . Eus N/A 08/20/2015    Dr. Ardis Hughs: well-circumscribed 5.2 cm chronic mass in porta hepatis, internally mass is gelatinous. Small distal esophageal varices (limited view), Bilroth 1 type anatomy, cytology NEGATIVE for malignant cells. Suspicion for neoplasm low     Current Outpatient Prescriptions  Medication Sig Dispense Refill  . calcium-vitamin D (OSCAL WITH D) 500-200 MG-UNIT tablet Take 1 tablet by mouth 2 (two) times daily.    Marland Kitchen FLUoxetine (PROZAC) 40 MG capsule Take 40 mg by mouth daily.     Marland Kitchen LINZESS 290 MCG CAPS capsule TAKE ONE CAPSULE BY MOUTH DAILY 30 MINUTES BEFORE BREAKFAST. 30 capsule 11  . ondansetron (ZOFRAN) 8 MG tablet Take 1 tablet (8 mg total) by mouth every 4 (four) hours as needed. 8 tablet 0  . oxyCODONE-acetaminophen (PERCOCET) 5-325 MG tablet Take 1-2 tablets by mouth every 4 (four) hours as needed. 20 tablet 0  . pantoprazole (PROTONIX) 40 MG tablet Take 1 tablet (40 mg total) by mouth daily. 30 tablet 12  . risperiDONE (RISPERDAL) 2 MG tablet Take 2 mg by mouth at bedtime.    . sucralfate (CARAFATE) 1 GM/10ML suspension Take 10 mLs (1 g total) by mouth 3 (three) times daily with meals as needed. 420 mL 1  . traZODone (DESYREL) 150 MG tablet Take 300 mg by mouth at bedtime.     No current facility-administered medications for this visit.    Allergies as of 11/04/2015 - Review Complete 11/04/2015  Allergen Reaction Noted  . Ciprofloxacin Rash     Family History  Problem Relation Age of Onset  . Colon cancer Maternal Uncle 77  . Diabetes Father   . Liver disease Neg Hx     Social History   Social History  . Marital Status: Divorced    Spouse Name: N/A  . Number of Children: 2  . Years of Education: N/A   Occupational History  . disabled    Social History Main Topics  . Smoking status: Former Smoker -- 1.00 packs/day for 30 years     Types: Cigarettes    Quit date: 10/21/2015  . Smokeless tobacco: Never Used  . Alcohol Use: No  . Drug Use: No  . Sexual Activity: No   Other Topics Concern  . None   Social History Narrative   Lives w/ mother   2 grown children    Review of Systems: General: Negative for anorexia, weight loss, fever, chills, fatigue, weakness. ENT: Negative for hoarseness, difficulty swallowing. CV: Negative for chest pain, angina, palpitations, peripheral edema.  Respiratory: Negative for dyspnea at rest, cough, sputum, wheezing.  GI: See history of present illness. Endo: Negative for unusual weight change.  Heme: Negative for bruising or bleeding.   Physical Exam: BP 102/64 mmHg  Pulse 63  Temp(Src) 97.2 F (36.2 C)  Ht 5\' 4"  (1.626 m)  Wt 177 lb 3.2 oz (80.377 kg)  BMI 30.40 kg/m2 General:   Alert and oriented. Pleasant and cooperative. Well-nourished and well-developed.  Head:  Normocephalic and atraumatic. Eyes:  Without icterus, sclera clear and conjunctiva  pink.  Cardiovascular:  S1, S2 present without murmurs appreciated. Extremities without clubbing or edema. Respiratory:  Clear to auscultation bilaterally. No wheezes, rales, or rhonchi. No distress.  Gastrointestinal:  +BS, rounded but soft, non-tender and non-distended. No guarding or rebound. No masses appreciated. No jaundice appreciated. Rectal:  Deferred  Musculoskalatal:  Symmetrical without gross deformities. Skin:  Intact without significant lesions or rashes. Neurologic:  Alert and oriented x4;  grossly normal neurologically. Psych:  Alert and cooperative. Normal mood and affect. Heme/Lymph/Immune: No excessive bruising noted.    11/04/2015 8:17 AM   Disclaimer: This note was dictated with voice recognition software. Similar sounding words can inadvertently be transcribed and may not be corrected upon review.

## 2015-11-30 NOTE — Interval H&P Note (Signed)
History and Physical Interval Note:  11/30/2015 7:27 AM  [name]  has presented today for surgery, with the diagnosis of N/V  The various methods of treatment have been discussed with the patient and family. After consideration of risks, benefits and other options for treatment, the patient has consented to  Procedure(s) with comments: ESOPHAGOGASTRODUODENOSCOPY (EGD) WITH PROPOFOL (N/A) - 730  as a surgical intervention .  The patient's history has been reviewed, patient examined, no change in status, stable for surgery.  I have reviewed the patient's chart and labs.  Questions were answered to the patient's satisfaction.     [name]  No change; screening EGD today per plan. The risks, benefits, limitations, alternatives and imponderables have been reviewed with the patient. Potential for esophageal dilation, biopsy, etc. have also been reviewed.  Questions have been answered. All parties agreeable.

## 2015-12-03 ENCOUNTER — Encounter (HOSPITAL_COMMUNITY): Payer: Self-pay | Admitting: Internal Medicine

## 2016-03-10 DIAGNOSIS — K210 Gastro-esophageal reflux disease with esophagitis: Secondary | ICD-10-CM | POA: Diagnosis not present

## 2016-03-10 DIAGNOSIS — K5904 Chronic idiopathic constipation: Secondary | ICD-10-CM | POA: Diagnosis not present

## 2016-03-10 DIAGNOSIS — J209 Acute bronchitis, unspecified: Secondary | ICD-10-CM | POA: Diagnosis not present

## 2016-03-10 DIAGNOSIS — R109 Unspecified abdominal pain: Secondary | ICD-10-CM | POA: Diagnosis not present

## 2016-03-15 ENCOUNTER — Ambulatory Visit (INDEPENDENT_AMBULATORY_CARE_PROVIDER_SITE_OTHER): Payer: Medicare Other | Admitting: Nurse Practitioner

## 2016-03-15 ENCOUNTER — Other Ambulatory Visit: Payer: Self-pay

## 2016-03-15 ENCOUNTER — Other Ambulatory Visit (HOSPITAL_COMMUNITY): Payer: Self-pay | Admitting: General Surgery

## 2016-03-15 ENCOUNTER — Encounter: Payer: Self-pay | Admitting: Nurse Practitioner

## 2016-03-15 VITALS — BP 102/58 | HR 50 | Temp 98.1°F | Ht 64.0 in | Wt 178.2 lb

## 2016-03-15 DIAGNOSIS — K7460 Unspecified cirrhosis of liver: Secondary | ICD-10-CM

## 2016-03-15 DIAGNOSIS — R1900 Intra-abdominal and pelvic swelling, mass and lump, unspecified site: Secondary | ICD-10-CM

## 2016-03-15 DIAGNOSIS — R112 Nausea with vomiting, unspecified: Secondary | ICD-10-CM | POA: Diagnosis not present

## 2016-03-15 DIAGNOSIS — K7469 Other cirrhosis of liver: Secondary | ICD-10-CM

## 2016-03-15 DIAGNOSIS — R1906 Epigastric swelling, mass or lump: Secondary | ICD-10-CM

## 2016-03-15 NOTE — Assessment & Plan Note (Signed)
Symptoms well-controlled, no complaints today. Continue to monitor.

## 2016-03-15 NOTE — Patient Instructions (Signed)
1. Have your labs drawn when you're able to. 2. We will help you schedule your ultrasound. 3. Keep your follow-up appointment with the surgeon (Dr. Barry Dienes). 4. Return for follow-up in 6 months.

## 2016-03-15 NOTE — Assessment & Plan Note (Signed)
Asymptomatic at this point, has plan follow-up visit with the surgeon next month. Continue to monitor.

## 2016-03-15 NOTE — Assessment & Plan Note (Signed)
Generally well compensated, currently due for labs and imaging. Recent screening endoscopy for varices found no varices and recommended 2 year repeat screening for which the patient is artery on the recall list. Return for follow-up in 6 months.

## 2016-03-15 NOTE — Progress Notes (Signed)
Referring Provider: Sinda Du, MD Primary Care Physician:  Alonza Bogus, MD Primary GI:  Dr. Gala Romney  Chief Complaint  Patient presents with  . Follow-up    Doing well    HPI:   [name] is a 60 y.o. female who presents For follow-up on cirrhosis, nausea and vomiting, abdominal mass. At that last office visit on 11/04/2015 her labs and imaging are up-to-date she was due for surveillance endoscopy. Nausea and vomiting well-controlled on Zofran, hepatic mass decreasing in size found to be gelatinous on EUS with aspiration biopsy deemed likely benign. She is being followed by the surgeon who will be following up with her approximately September 2017. Recommended she call the surgeon with any recurrent significant abdominal pain. Last liver labs include CBC, CMP, PT/INR, AFP completed 09/17/2015. Liver function good and stable, mildly anemic but likely due to recent procedure and complications, no sign of cancer based on AFP and CT of the abdomen. Hemoglobin subsequently normalized 12.9 and 13.1 in February and March 2017.  Screening endoscopy for varices completed 11/30/2015 which found LA grade a reflux esophagitis, no varices, surgically altered stomach. Recommended advance diet as tolerated, continue medications, follow-up in 6 months. Also recommended return to Dr. Ardis Hughs regarding porta hepatis lesion and repeat screening endoscopy in 2 years.  Today she states she's doing well overall. Tried to make f/u appointment with Dr. Ardis Hughs and was told she couldn't have two GI providers. She does have an appointment with the surgeon (Dr. Barry Dienes) in August. Denies abdominal pain, N/V, yellowing of the skin or eyes, darkened urine, acute episodic confusion, tremors. Overall states she feels pretty good. Denies chest pain, dyspnea, dizziness, lightheadedness, syncope, near syncope. Denies any other upper or lower GI symptoms.  Past Medical History  Diagnosis Date  . Depression   .  Chronic constipation   . Diverticulosis 01/31/2008    colonoscopy Dr Gala Romney  . Obesity   . Schizoaffective disorder     Mental Health-Dr Elias Else  . PUD (peptic ulcer disease) 1988  . Colonic inertia   . Hyperplastic colon polyp   . Diverticula of colon   . Cirrhosis ([place])     child pugh class A and MELD 5, completed hep A and B vaccine series, no HCC on 3/16 MRI  . Liver mass     Past Surgical History  Procedure Laterality Date  . Tonsillectomy    . S/p hysterectomy      partial  . Gastric bypass      with Revision, Dr. Duffy Rhody  . Wrist surgery    . Foot surgery      left  . Cholecystectomy  08/2010    cholelithiasis; Dr Arnoldo Morale  . Colonoscopy  01/31/2008    Dr. Gala Romney- normal rectum, L sided diverticula, long tortuous colon. hyperplasitic  polyp.  . Ercp w/ sphincterotomy and balloon dilation  08/05/2010    normal ampulla/mild erythema in the gastric remnant without ulcerations or erosions. normal retroflexed view of the cardia/anastomosis normal/distal common bile duct stones s/p extraction and shincterotomy  . Esophagogastroduodenoscopy N/A 10/24/2013    [id number] esophagus. Surgically altered stomach. Abnormal gastric mucosa  -  status post biopsy (reactive gastrophathy)  . Eus N/A 08/20/2015    Dr. Ardis Hughs: well-circumscribed 5.2 cm chronic mass in porta hepatis, internally mass is gelatinous. Small distal esophageal varices (limited view), Bilroth 1 type anatomy, cytology NEGATIVE for malignant cells. Suspicion for neoplasm low   . Esophagogastroduodenoscopy (egd) with propofol N/A 11/30/2015  Procedure: ESOPHAGOGASTRODUODENOSCOPY (EGD) WITH PROPOFOL;  Surgeon: Daneil Dolin, MD;  Location: [facility];  Service: Endoscopy;  Laterality: N/A;  730     Current Outpatient Prescriptions  Medication Sig Dispense Refill  . FLUoxetine (PROZAC) 40 MG capsule Take 40 mg by mouth daily.     Marland Kitchen LINZESS 290 MCG CAPS capsule TAKE ONE CAPSULE BY MOUTH DAILY 30 MINUTES BEFORE  BREAKFAST. 30 capsule 11  . Nutritional Supplements (HI-CAL PO) Take 4 tablets by mouth daily.    . ondansetron (ZOFRAN) 8 MG tablet Take 1 tablet (8 mg total) by mouth every 4 (four) hours as needed. 30 tablet 0  . pantoprazole (PROTONIX) 40 MG tablet Take 1 tablet (40 mg total) by mouth daily. 30 tablet 12  . risperiDONE (RISPERDAL) 2 MG tablet Take 2 mg by mouth at bedtime.    . traZODone (DESYREL) 150 MG tablet Take 300 mg by mouth at bedtime.    Marland Kitchen oxyCODONE-acetaminophen (PERCOCET) 5-325 MG tablet Take 1-2 tablets by mouth every 4 (four) hours as needed. (Patient not taking: Reported on 11/18/2015) 20 tablet 0  . sucralfate (CARAFATE) 1 GM/10ML suspension Take 10 mLs (1 g total) by mouth 3 (three) times daily with meals as needed. (Patient not taking: Reported on 03/15/2016) 420 mL 1   No current facility-administered medications for this visit.    Allergies as of 03/15/2016 - Review Complete 03/15/2016  Allergen Reaction Noted  . Ciprofloxacin Rash   . Kiwi extract Other (See Comments) 11/25/2015    Family History  Problem Relation Age of Onset  . Colon cancer Maternal Uncle 65  . Diabetes Father   . Liver disease Neg Hx     Social History   Social History  . Marital Status: Divorced    Spouse Name: N/A  . Number of Children: 2  . Years of Education: N/A   Occupational History  . disabled    Social History Main Topics  . Smoking status: Former Smoker -- 1.00 packs/day for 30 years    Types: Cigarettes    Quit date: 11/13/2015  . Smokeless tobacco: Never Used     Comment: started back x 3 months/ a pack a day  . Alcohol Use: No  . Drug Use: Yes    Special: Marijuana     Comment: 4 joints per day  . Sexual Activity: No   Other Topics Concern  . None   Social History Narrative   Lives w/ mother   2 grown children    Review of Systems: General: Negative for anorexia, weight loss, fever, chills, fatigue, weakness. ENT: Negative for hoarseness, nasal  congestion. CV: Negative for chest pain, angina, palpitations, peripheral edema.  Respiratory: Negative for dyspnea at rest, cough, sputum, wheezing.  GI: See history of present illness. Endo: Negative for unusual weight change.  Heme: Negative for bruising or bleeding.   Physical Exam: BP 102/58 mmHg  Pulse 50  Temp(Src) 98.1 F (36.7 C) (Oral)  Ht 5\' 4"  (1.626 m)  Wt 178 lb 3.2 oz (80.831 kg)  BMI 30.57 kg/m2 General:   Alert and oriented. Pleasant and cooperative. Well-nourished and well-developed.  Head:  Normocephalic and atraumatic. Eyes:  Without icterus, sclera clear and conjunctiva pink.  Ears:  Normal auditory acuity. Cardiovascular:  S1, S2 present without murmurs appreciated. Extremities without clubbing or edema. Respiratory:  Clear to auscultation bilaterally. No wheezes, rales, or rhonchi. No distress.  Gastrointestinal:  +BS, soft, non-tender and non-distended. No HSM noted. No guarding or rebound.  No masses appreciated.  Rectal:  Deferred  Musculoskalatal:  Symmetrical without gross deformities. Skin:  Intact without significant lesions or rashes. Neurologic:  Alert and oriented x4;  grossly normal neurologically. Psych:  Alert and cooperative. Normal mood, flat affect. Heme/Lymph/Immune: No excessive bruising noted.    03/15/2016 8:32 AM   Disclaimer: This note was dictated with voice recognition software. Similar sounding words can inadvertently be transcribed and may not be corrected upon review.

## 2016-03-15 NOTE — Progress Notes (Signed)
cc'ed to pcp °

## 2016-03-21 ENCOUNTER — Ambulatory Visit (HOSPITAL_COMMUNITY)
Admission: RE | Admit: 2016-03-21 | Discharge: 2016-03-21 | Disposition: A | Discharge status: Home / Self Care | Payer: Medicare Other | Source: Ambulatory Visit | Attending: Nurse Practitioner | Admitting: Nurse Practitioner

## 2016-03-21 DIAGNOSIS — K7469 Other cirrhosis of liver: Secondary | ICD-10-CM

## 2016-03-21 DIAGNOSIS — R1900 Intra-abdominal and pelvic swelling, mass and lump, unspecified site: Secondary | ICD-10-CM | POA: Diagnosis not present

## 2016-03-21 DIAGNOSIS — K7689 Other specified diseases of liver: Secondary | ICD-10-CM | POA: Diagnosis not present

## 2016-03-21 MED ORDER — BARIUM SULFATE 2.1 % PO SUSP
ORAL | Status: AC
  Filled 2016-03-21: qty 2

## 2016-03-23 NOTE — Progress Notes (Signed)
ON RECALL  °

## 2016-03-25 ENCOUNTER — Other Ambulatory Visit (HOSPITAL_COMMUNITY): Payer: Self-pay | Admitting: General Surgery

## 2016-03-25 ENCOUNTER — Ambulatory Visit (HOSPITAL_COMMUNITY)
Admission: RE | Admit: 2016-03-25 | Discharge: 2016-03-25 | Disposition: A | Discharge status: Home / Self Care | Payer: Medicare Other | Source: Ambulatory Visit | Attending: General Surgery | Admitting: General Surgery

## 2016-03-25 DIAGNOSIS — I700 Atherosclerosis of aorta: Secondary | ICD-10-CM | POA: Diagnosis not present

## 2016-03-25 DIAGNOSIS — K7460 Unspecified cirrhosis of liver: Secondary | ICD-10-CM | POA: Insufficient documentation

## 2016-03-25 DIAGNOSIS — R1906 Epigastric swelling, mass or lump: Secondary | ICD-10-CM

## 2016-03-25 DIAGNOSIS — R911 Solitary pulmonary nodule: Secondary | ICD-10-CM | POA: Diagnosis not present

## 2016-03-25 DIAGNOSIS — K439 Ventral hernia without obstruction or gangrene: Secondary | ICD-10-CM | POA: Diagnosis not present

## 2016-03-25 MED ORDER — IOPAMIDOL (ISOVUE-300) INJECTION 61%
100.0000 mL | Freq: Once | INTRAVENOUS | Status: AC | PRN
  Administered 2016-03-25: 100 mL via INTRAVENOUS

## 2016-03-28 NOTE — Progress Notes (Signed)
Please let patient know mass is improved!  Recommend follow up MRI in 6 months.

## 2016-04-05 DIAGNOSIS — R112 Nausea with vomiting, unspecified: Secondary | ICD-10-CM | POA: Diagnosis not present

## 2016-04-05 DIAGNOSIS — R1900 Intra-abdominal and pelvic swelling, mass and lump, unspecified site: Secondary | ICD-10-CM | POA: Diagnosis not present

## 2016-04-05 DIAGNOSIS — K7460 Unspecified cirrhosis of liver: Secondary | ICD-10-CM | POA: Diagnosis not present

## 2016-04-05 LAB — CBC WITH DIFFERENTIAL/PLATELET
Basophils Absolute: 0 cells/uL (ref 0–200)
Basophils Relative: 0 %
Eosinophils Absolute: 308 cells/uL (ref 15–500)
Eosinophils Relative: 4 %
HCT: 42.4 % (ref 35.0–45.0)
Hemoglobin: 13.9 g/dL (ref 11.7–15.5)
Lymphocytes Relative: 19 %
Lymphs Abs: 1463 cells/uL (ref 850–3900)
MCH: 30.2 pg (ref 27.0–33.0)
MCHC: 32.8 g/dL (ref 32.0–36.0)
MCV: 92 fL (ref 80.0–100.0)
MPV: 10.1 fL (ref 7.5–12.5)
Monocytes Absolute: 770 cells/uL (ref 200–950)
Monocytes Relative: 10 %
Neutro Abs: 5159 cells/uL (ref 1500–7800)
Neutrophils Relative %: 67 %
Platelets: 340 10*3/uL (ref 140–400)
RBC: 4.61 MIL/uL (ref 3.80–5.10)
RDW: 13.3 % (ref 11.0–15.0)
WBC: 7.7 10*3/uL (ref 3.8–10.8)

## 2016-04-05 LAB — PROTIME-INR
INR: 1.1
Prothrombin Time: 11.2 s (ref 9.0–11.5)

## 2016-04-06 LAB — COMPREHENSIVE METABOLIC PANEL
ALT: 10 U/L (ref 6–29)
AST: 12 U/L (ref 10–35)
Albumin: 3.9 g/dL (ref 3.6–5.1)
Alkaline Phosphatase: 71 U/L (ref 33–130)
BUN: 9 mg/dL (ref 7–25)
CO2: 24 mmol/L (ref 20–31)
Calcium: 9.4 mg/dL (ref 8.6–10.4)
Chloride: 112 mmol/L — ABNORMAL HIGH (ref 98–110)
Creat: 1.11 mg/dL — ABNORMAL HIGH (ref 0.50–1.05)
Glucose, Bld: 85 mg/dL (ref 65–99)
Potassium: 4.5 mmol/L (ref 3.5–5.3)
Sodium: 143 mmol/L (ref 135–146)
Total Bilirubin: 0.4 mg/dL (ref 0.2–1.2)
Total Protein: 6 g/dL — ABNORMAL LOW (ref 6.1–8.1)

## 2016-04-06 LAB — AFP TUMOR MARKER: AFP-Tumor Marker: 2.4 ng/mL (ref <6.1)

## 2016-04-06 NOTE — Progress Notes (Signed)
Please tell the patient her labs look good. Her liver disease is well compensated. Recommend routine 68-month follow-up as we discussed at her visit.  MELD: 8 Child-Pugh: A

## 2016-04-07 NOTE — Progress Notes (Signed)
Patient has appointment scheduled for fu ov

## 2016-04-14 DIAGNOSIS — R1906 Epigastric swelling, mass or lump: Secondary | ICD-10-CM | POA: Diagnosis not present

## 2016-05-06 HISTORY — PX: WRIST SURGERY: SHX841

## 2016-05-21 ENCOUNTER — Emergency Department (HOSPITAL_COMMUNITY): Payer: Medicare Other

## 2016-05-21 ENCOUNTER — Encounter (HOSPITAL_COMMUNITY): Payer: Self-pay | Admitting: Emergency Medicine

## 2016-05-21 ENCOUNTER — Emergency Department (HOSPITAL_COMMUNITY)
Admission: EM | Admit: 2016-05-21 | Discharge: 2016-05-21 | Disposition: A | Discharge status: Home / Self Care | Payer: Medicare Other | Attending: Emergency Medicine | Admitting: Emergency Medicine

## 2016-05-21 DIAGNOSIS — S52502A Unspecified fracture of the lower end of left radius, initial encounter for closed fracture: Secondary | ICD-10-CM | POA: Diagnosis not present

## 2016-05-21 DIAGNOSIS — W109XXA Fall (on) (from) unspecified stairs and steps, initial encounter: Secondary | ICD-10-CM | POA: Diagnosis not present

## 2016-05-21 DIAGNOSIS — S59202A Unspecified physeal fracture of lower end of radius, left arm, initial encounter for closed fracture: Secondary | ICD-10-CM | POA: Diagnosis not present

## 2016-05-21 DIAGNOSIS — Y9301 Activity, walking, marching and hiking: Secondary | ICD-10-CM | POA: Insufficient documentation

## 2016-05-21 DIAGNOSIS — Z472 Encounter for removal of internal fixation device: Secondary | ICD-10-CM | POA: Diagnosis not present

## 2016-05-21 DIAGNOSIS — S52602A Unspecified fracture of lower end of left ulna, initial encounter for closed fracture: Secondary | ICD-10-CM | POA: Diagnosis not present

## 2016-05-21 DIAGNOSIS — Y9222 Religious institution as the place of occurrence of the external cause: Secondary | ICD-10-CM | POA: Insufficient documentation

## 2016-05-21 DIAGNOSIS — Y999 Unspecified external cause status: Secondary | ICD-10-CM | POA: Diagnosis not present

## 2016-05-21 DIAGNOSIS — Z9889 Other specified postprocedural states: Secondary | ICD-10-CM

## 2016-05-21 DIAGNOSIS — F17210 Nicotine dependence, cigarettes, uncomplicated: Secondary | ICD-10-CM | POA: Diagnosis not present

## 2016-05-21 DIAGNOSIS — S52615A Nondisplaced fracture of left ulna styloid process, initial encounter for closed fracture: Secondary | ICD-10-CM | POA: Insufficient documentation

## 2016-05-21 DIAGNOSIS — S6992XA Unspecified injury of left wrist, hand and finger(s), initial encounter: Secondary | ICD-10-CM | POA: Diagnosis present

## 2016-05-21 DIAGNOSIS — S52592A Other fractures of lower end of left radius, initial encounter for closed fracture: Secondary | ICD-10-CM | POA: Diagnosis not present

## 2016-05-21 DIAGNOSIS — IMO0001 Reserved for inherently not codable concepts without codable children: Secondary | ICD-10-CM

## 2016-05-21 MED ORDER — OXYCODONE HCL 5 MG PO TABS: 5.0000 mg | ORAL_TABLET | Freq: Four times a day (QID) | ORAL | 0 refills | Status: DC | PRN

## 2016-05-21 MED ORDER — MORPHINE SULFATE (PF) 4 MG/ML IV SOLN
4.0000 mg | Freq: Once | INTRAVENOUS | Status: AC
Start: 2016-05-21 — End: 2016-05-21
  Administered 2016-05-21: 4 mg via INTRAVENOUS
  Filled 2016-05-21: qty 1

## 2016-05-21 MED ORDER — PROPOFOL 10 MG/ML IV BOLUS
0.5000 mg/kg | Freq: Once | INTRAVENOUS | Status: AC
Start: 2016-05-21 — End: 2016-05-21
  Administered 2016-05-21: 40.2 mg via INTRAVENOUS
  Filled 2016-05-21: qty 20

## 2016-05-21 NOTE — ED Notes (Signed)
Pt taken to xray 

## 2016-05-21 NOTE — ED Provider Notes (Signed)
[facility] DEPT Provider Note   CSN: [id number] Arrival date & time: 05/21/16  1254     History   Chief Complaint Chief Complaint  Patient presents with  . Fall    HPI [name] is a 60 y.o. female with past medical history significant for cirrhosis, depression, and schizoaffective disorder who presents for fall. Patient reports that while at church walking upstairs to morning, patient fell on her outstretched left hand. Patient is right-handed. She reports no other injury specifically, no striking of her head, chest, or other extremities. Patient says she had an obvious deformity to her left wrist prompting her to seek evaluation. Patient denies a numbness, tingling, or weakness in her left hand. Patient denies any bleeding or disruption of skin. Patient has no other complaints on arrival.    The history is provided by the patient and a friend. No language interpreter was used.  Wrist Pain  This is a new problem. The current episode started less than 1 hour ago. The problem occurs constantly. The problem has not changed since onset.Pertinent negatives include no chest pain, no abdominal pain, no headaches and no shortness of breath. Nothing aggravates the symptoms. Nothing relieves the symptoms. She has tried nothing for the symptoms. The treatment provided no relief.  Fall  This is a new problem. The current episode started less than 1 hour ago. The problem occurs constantly. Pertinent negatives include no chest pain, no abdominal pain, no headaches and no shortness of breath. Nothing aggravates the symptoms. Nothing relieves the symptoms.    Past Medical History:  Diagnosis Date  . Chronic constipation   . Cirrhosis ([place])    child pugh class A and MELD 5, completed hep A and B vaccine series, no HCC on 3/16 MRI  . Colonic inertia   . Depression   . Diverticula of colon   . Diverticulosis 01/31/2008   colonoscopy Dr Gala Romney  . Hyperplastic colon polyp   . Liver mass     . Obesity   . PUD (peptic ulcer disease) 1988  . Schizoaffective disorder    Mental Health-Dr Dekle    Patient Active Problem List   Diagnosis Date Noted  . Mucosal abnormality of stomach   . Nausea with vomiting 11/04/2015  . Abdominal pain, epigastric 09/17/2015  . Peritonitis ([place]) 08/27/2015  . Pain following surgery or procedure 08/22/2015  . Abdominal pain 08/01/2015  . Nausea 08/01/2015  . Abdominal mass 08/01/2015  . Schizoaffective disorder ([place]) 05/15/2014  . Hepatic cirrhosis ([place]) 10/18/2013  . LLQ pain 08/13/2013  . DEPRESSION 04/08/2008  . PUD 04/08/2008  . DIVERTICULOSIS, COLON 04/08/2008  . CONSTIPATION 04/08/2008  . MORBID OBESITY, HX OF 04/08/2008    Past Surgical History:  Procedure Laterality Date  . ABDOMINAL HYSTERECTOMY    . CHOLECYSTECTOMY  08/2010   cholelithiasis; Dr Arnoldo Morale  . COLONOSCOPY  01/31/2008   Dr. Gala Romney- normal rectum, L sided diverticula, long tortuous colon. hyperplasitic  polyp.  Marland Kitchen ERCP W/ SPHINCTEROTOMY AND BALLOON DILATION  08/05/2010   normal ampulla/mild erythema in the gastric remnant without ulcerations or erosions. normal retroflexed view of the cardia/anastomosis normal/distal common bile duct stones s/p extraction and shincterotomy  . ESOPHAGOGASTRODUODENOSCOPY N/A 10/24/2013   [id number] esophagus. Surgically altered stomach. Abnormal gastric mucosa  -  status post biopsy (reactive gastrophathy)  . ESOPHAGOGASTRODUODENOSCOPY (EGD) WITH PROPOFOL N/A 11/30/2015   Procedure: ESOPHAGOGASTRODUODENOSCOPY (EGD) WITH PROPOFOL;  Surgeon: Daneil Dolin, MD;  Location: [facility];  Service: Endoscopy;  Laterality: N/A;  730   . EUS N/A 08/20/2015   Dr. Ardis Hughs: well-circumscribed 5.2 cm chronic mass in porta hepatis, internally mass is gelatinous. Small distal esophageal varices (limited view), Bilroth 1 type anatomy, cytology NEGATIVE for malignant cells. Suspicion for neoplasm low   . FOOT SURGERY     left  . GASTRIC BYPASS      with Revision, Dr. Duffy Rhody  . S/P Hysterectomy     partial  . TONSILLECTOMY    . WRIST SURGERY      OB History    No data available       Home Medications    Prior to Admission medications   Medication Sig Start Date End Date Taking? Authorizing Provider  FLUoxetine (PROZAC) 40 MG capsule Take 40 mg by mouth daily.  06/06/12   Historical Provider, MD  LINZESS 290 MCG CAPS capsule TAKE ONE CAPSULE BY MOUTH DAILY 30 MINUTES BEFORE BREAKFAST. 07/20/15   Carlis Stable, NP  Nutritional Supplements (HI-CAL PO) Take 4 tablets by mouth daily.    Historical Provider, MD  ondansetron (ZOFRAN) 8 MG tablet Take 1 tablet (8 mg total) by mouth every 4 (four) hours as needed. 11/04/15   Carlis Stable, NP  pantoprazole (PROTONIX) 40 MG tablet Take 1 tablet (40 mg total) by mouth daily. 08/04/15   Sinda Du, MD  risperiDONE (RISPERDAL) 2 MG tablet Take 2 mg by mouth at bedtime.    Historical Provider, MD  traZODone (DESYREL) 150 MG tablet Take 300 mg by mouth at bedtime. 07/20/15   Historical Provider, MD    Family History Family History  Problem Relation Age of Onset  . Diabetes Father   . Colon cancer Maternal Uncle 53  . Liver disease Neg Hx     Social History Social History  Substance Use Topics  . Smoking status: Current Every Day Smoker    Packs/day: 1.00    Years: 30.00    Types: Cigarettes    Last attempt to quit: 11/13/2015  . Smokeless tobacco: Never Used     Comment: started back x 3 months/ a pack a day  . Alcohol use No     Allergies   Ciprofloxacin and Kiwi extract   Review of Systems Review of Systems  Constitutional: Negative for chills and fever.  HENT: Negative for rhinorrhea.   Respiratory: Negative for chest tightness, shortness of breath, wheezing and stridor.   Cardiovascular: Negative for chest pain, palpitations and leg swelling.  Gastrointestinal: Negative for abdominal pain, diarrhea, nausea and vomiting.  Genitourinary: Negative for dysuria.    Musculoskeletal: Positive for joint swelling. Negative for back pain, neck pain and neck stiffness.  Skin: Negative for color change, rash and wound.  Neurological: Negative for syncope, weakness, light-headedness, numbness and headaches.  Psychiatric/Behavioral: Negative for agitation and confusion.  All other systems reviewed and are negative.    Physical Exam Updated Vital Signs BP 110/58 (BP Location: Right Arm)   Pulse (!) 55   Temp 98.7 F (37.1 C) (Oral)   Resp 20   Ht 5\' 4"  (1.626 m)   Wt 177 lb (80.3 kg)   SpO2 100%   BMI 30.38 kg/m   Physical Exam  Constitutional: She is oriented to person, place, and time. She appears well-developed and well-nourished. No distress.  HENT:  Head: Normocephalic and atraumatic.  Mouth/Throat: Oropharynx is clear and moist. No oropharyngeal exudate.  Eyes: Conjunctivae and EOM are normal. Pupils are equal, round, and reactive to light.  Neck: Normal range of motion.  Neck supple.  Cardiovascular: Normal rate and regular rhythm.   No murmur heard. Pulmonary/Chest: Effort normal and breath sounds normal. No stridor. No respiratory distress. She exhibits no tenderness.  Abdominal: Soft. There is no tenderness.  Musculoskeletal: She exhibits edema, tenderness and deformity.       Left elbow: She exhibits normal range of motion, no swelling, no effusion, no deformity and no laceration. No tenderness found.       Left wrist: She exhibits decreased range of motion, tenderness, bony tenderness, swelling, crepitus and deformity. She exhibits no laceration.  Left wrist deformity. No laceration. Normal sensation and strength and fingers. Range of motion of wrist limited by pain. Mild tenderness on distal forearm with no pain at elbow.  Neurological: She is alert and oriented to person, place, and time. She exhibits normal muscle tone.  Skin: Skin is warm and dry. Capillary refill takes less than 2 seconds. No rash noted. No erythema.  Psychiatric:  She has a normal mood and affect.  Nursing note and vitals reviewed.    ED Treatments / Results  Labs (all labs ordered are listed, but only abnormal results are displayed) Labs Reviewed - No data to display  EKG  EKG Interpretation None       Radiology Dg Forearm Left  Result Date: 05/21/2016 CLINICAL DATA:  Fall down steps at church today, deformity at left wrist, removed immobilization for these views EXAM: LEFT FOREARM - 2 VIEW COMPARISON:  None. FINDINGS: Mildly comminuted fracture of the distal radial metaphysis. Non comminuted nondisplaced fracture of the ulnar styloid. Significant dorsal angulation of the distal radial fracture. No other fractures.  Elbow and wrist joints are normally aligned. IMPRESSION: Dorsally angulated fracture of the distal radial metaphysis. Nondisplaced ulnar styloid fracture. No other fractures. No dislocation. Electronically Signed   By: Lajean Manes M.D.   On: 05/21/2016 14:25   Dg Wrist Complete Left  Result Date: 05/21/2016 CLINICAL DATA:  [place] on church steps today, obvious deformity of left wrist, pt immobilized by volunteer responders EXAM: LEFT WRIST - COMPLETE 3+ VIEW COMPARISON:  None. FINDINGS: Mildly comminuted fracture of the distal left radius, transverse across the metaphysis, with no significant displacement, but with significant dorsal angulation of the distal radial articular surface, measuring approximately 35 degrees. Nondisplaced fracture of the ulnar styloid. No other fractures.  No dislocation. IMPRESSION: Left distal radius and ulnar styloid fractures as described. Electronically Signed   By: Lajean Manes M.D.   On: 05/21/2016 13:43    Procedures .Splint Application Date/Time: 99991111 5:01 PM Performed by: Courtney Paris Authorized by: Courtney Paris   Consent:    Consent obtained:  Written   Consent given by:  Patient   Risks discussed:  Numbness, pain, swelling and discoloration   Alternatives  discussed:  No treatment and delayed treatment Universal protocol:    Procedure explained and questions answered to patient or proxy's satisfaction: yes     Relevant documents present and verified: yes     Test results available and properly labeled: yes     Imaging studies available: yes     Immediately prior to procedure a time out was called: yes     Patient identity confirmed:  Verbally with patient and arm band Pre-procedure details:    Sensation:  Normal Procedure details:    Laterality:  Left   Location:  Wrist   Wrist:  L wrist   Splint type:  Sugar tong   Supplies:  Prefabricated splint Post-procedure details:  Pain:  Improved   Sensation:  Normal   Patient tolerance of procedure:  Tolerated well, no immediate complications Reduction of fracture Date/Time: 05/21/2016 5:03 PM Performed by: Courtney Paris Authorized by: Courtney Paris  Consent: Written consent obtained. Risks and benefits: risks, benefits and alternatives were discussed Patient identity confirmed: verbally with patient and arm band Time out: Immediately prior to procedure a "time out" was called to verify the correct patient, procedure, equipment, support staff and site/side marked as required. Local anesthesia used: no  Anesthesia: Local anesthesia used: no  Sedation: Patient sedated: yes Sedatives: propofol Vitals: Vital signs were monitored during sedation. Patient tolerance: Patient tolerated the procedure well with no immediate complications    (including critical care time)   Procedural sedation Performed by: Gwenyth Allegra Amyr Sluder Consent: Verbal consent obtained. Risks and benefits: risks, benefits and alternatives were discussed Required items: required blood products, implants, devices, and special equipment available Patient identity confirmed: arm band and provided demographic data Time out: Immediately prior to procedure a "time out" was called to verify the correct  patient, procedure, equipment, support staff and site/side marked as required.  Sedation type: moderate (conscious) sedation NPO time confirmed and considedered  Sedatives: PROPOFOL  Physician Time at Bedside: 30  Vitals: Vital signs were monitored during sedation. Cardiac Monitor, pulse oximeter Patient tolerance: Patient tolerated the procedure well with no immediate complications. Comments: Pt with uneventful recovered. Returned to pre-procedural sedation baseline     Medications Ordered in ED Medications  morphine 4 MG/ML injection 4 mg (4 mg Intravenous Given 05/21/16 1347)  propofol (DIPRIVAN) 10 mg/mL bolus/IV push 40.2 mg (40.2 mg Intravenous Given 05/21/16 1615)     Initial Impression / Assessment and Plan / ED Course  I have reviewed the triage vital signs and the nursing notes.  Pertinent labs & imaging results that were available during my care of the patient were reviewed by me and considered in my medical decision making (see chart for details).  Clinical Course  Value Comment By Time  DG Wrist Complete Left (Reviewed) Gwenyth Allegra Leetta Hendriks, MD 09/16 Cuming is a 60 y.o. female with past medical history significant for cirrhosis, depression, and schizoaffective disorder who presents for fall. On exam, patient has deformity to left wrist and remains neurovascularly intact. X-rays obtained revealing distal radius and ulnar fractures. No displacement imaging however there was angulation.  Patient's fracture reduced at bedside with procedural sedation please see documentation above for details. Patient tolerated sedation without difficulty. Splint applied. Postreduction film obtained and showed improvement in angulation. Post reduction and splinting exam showed normal sensation, strength, capillary refill, and significant improvement in pain.  Orthopedics team called and will see patient in clinic in several days. Patient will be provided prescription  for pain medication and return precautions for splint care.  Patient had no other questions or concerns and was discharged in good condition with improvement in her pain.    Final Clinical Impressions(s) / ED Diagnoses   Final diagnoses:  Status post closed reduction with internal fixation  Distal radial fracture, left, closed, initial encounter  Closed fracture of distal end of left ulna, initial encounter    New Prescriptions New Prescriptions   OXYCODONE (ROXICODONE) 5 MG IMMEDIATE RELEASE TABLET    Take 1 tablet (5 mg total) by mouth every 6 (six) hours as needed for severe pain.    Clinical Impression: 1. Distal radial fracture, left, closed, initial encounter   2. Status  post closed reduction with internal fixation   3. Closed fracture of distal end of left ulna, initial encounter     Disposition: Discharge  Condition: Good  I have discussed the results, Dx and Tx plan with the pt(& family if present). He/she/they expressed understanding and agree(s) with the plan. Discharge instructions discussed at great length. Strict return precautions discussed and pt &/or family have verbalized understanding of the instructions. No further questions at time of discharge.    New Prescriptions   OXYCODONE (ROXICODONE) 5 MG IMMEDIATE RELEASE TABLET    Take 1 tablet (5 mg total) by mouth every 6 (six) hours as needed for severe pain.    Follow Up: Wylene Simmer, MD [address] [place] [address] [place] [address] [phone]  Schedule an appointment as soon as possible for a visit  Please call [phone] to schedule appointment with the orthopedics team.     Gwenyth Allegra Reeanna Acri, MD 05/21/16 1719

## 2016-05-21 NOTE — ED Triage Notes (Signed)
Pt c/o of tripping up steps and hurting left wrist. Pt reports pain and tingling in hand. 2+ radial pulse. Pt states good sensation and able to move distal extremities.

## 2016-05-24 DIAGNOSIS — S52532A Colles' fracture of left radius, initial encounter for closed fracture: Secondary | ICD-10-CM | POA: Diagnosis not present

## 2016-05-26 DIAGNOSIS — S52572A Other intraarticular fracture of lower end of left radius, initial encounter for closed fracture: Secondary | ICD-10-CM | POA: Diagnosis not present

## 2016-05-26 DIAGNOSIS — S52532A Colles' fracture of left radius, initial encounter for closed fracture: Secondary | ICD-10-CM | POA: Diagnosis not present

## 2016-05-26 DIAGNOSIS — G8918 Other acute postprocedural pain: Secondary | ICD-10-CM | POA: Diagnosis not present

## 2016-06-29 ENCOUNTER — Encounter (HOSPITAL_COMMUNITY): Payer: Self-pay

## 2016-06-29 ENCOUNTER — Ambulatory Visit (HOSPITAL_COMMUNITY): Payer: Medicare Other | Attending: Orthopedic Surgery

## 2016-06-29 DIAGNOSIS — R29898 Other symptoms and signs involving the musculoskeletal system: Secondary | ICD-10-CM

## 2016-06-29 DIAGNOSIS — M25532 Pain in left wrist: Secondary | ICD-10-CM

## 2016-06-29 DIAGNOSIS — M25632 Stiffness of left wrist, not elsewhere classified: Secondary | ICD-10-CM

## 2016-06-29 NOTE — Therapy (Signed)
[place], [place], [address] Phone: [phone]   Fax:  [phone]  Occupational Therapy Evaluation  Patient Details  Name: [name] MRN: [id number] Date of Birth: April 28, 1956 Referring Provider: Iran Planas, MD  Encounter Date: 06/29/2016      OT End of Session - 06/29/16 1016    Visit Number 1   Number of Visits 16   Date for OT Re-Evaluation 07/29/16   Authorization Type [employer] Medicare   Authorization Time Period before 10th visit   Authorization - Visit Number 1   Authorization - Number of Visits 10   OT Start Time 0820   OT Stop Time 0900   OT Time Calculation (min) 40 min   Activity Tolerance Patient tolerated treatment well   Behavior During Therapy [facility] for tasks assessed/performed      Past Medical History:  Diagnosis Date  . Chronic constipation   . Cirrhosis ([place])    child pugh class A and MELD 5, completed hep A and B vaccine series, no HCC on 3/16 MRI  . Colonic inertia   . Depression   . Diverticula of colon   . Diverticulosis 01/31/2008   colonoscopy Dr Gala Romney  . Hyperplastic colon polyp   . Liver mass   . Obesity   . PUD (peptic ulcer disease) 1988  . Schizoaffective disorder    Mental Health-Dr Elias Else    Past Surgical History:  Procedure Laterality Date  . ABDOMINAL HYSTERECTOMY    . CHOLECYSTECTOMY  08/2010   cholelithiasis; Dr Arnoldo Morale  . COLONOSCOPY  01/31/2008   Dr. Gala Romney- normal rectum, L sided diverticula, long tortuous colon. hyperplasitic  polyp.  Marland Kitchen ERCP W/ SPHINCTEROTOMY AND BALLOON DILATION  08/05/2010   normal ampulla/mild erythema in the gastric remnant without ulcerations or erosions. normal retroflexed view of the cardia/anastomosis normal/distal common bile duct stones s/p extraction and shincterotomy  . ESOPHAGOGASTRODUODENOSCOPY N/A 10/24/2013   [id number] esophagus. Surgically altered stomach. Abnormal gastric mucosa  -  status post biopsy (reactive  gastrophathy)  . ESOPHAGOGASTRODUODENOSCOPY (EGD) WITH PROPOFOL N/A 11/30/2015   Procedure: ESOPHAGOGASTRODUODENOSCOPY (EGD) WITH PROPOFOL;  Surgeon: Daneil Dolin, MD;  [facility];  Service: Endoscopy;  Laterality: N/A;  730   . EUS N/A 08/20/2015   Dr. Ardis Hughs: well-circumscribed 5.2 cm chronic mass in porta hepatis, internally mass is gelatinous. Small distal esophageal varices (limited view), Bilroth 1 type anatomy, cytology NEGATIVE for malignant cells. Suspicion for neoplasm low   . FOOT SURGERY     left  . GASTRIC BYPASS     with Revision, Dr. Duffy Rhody  . S/P Hysterectomy     partial  . TONSILLECTOMY    . WRIST SURGERY      There were no vitals filed for this visit.      Subjective Assessment - 06/29/16 0824    Subjective  S: It feels really weak.   Pertinent History Patient is a 60 y/o female S/P left radius fracture which occurred after falling up the stairs on 05/21/16. Pt underwent surgery on 05/26/16 and was placed in a soft brace. Dr. Caralyn Guile has referred patient to occupational therapy for evaluation and treatment.    Special Tests Complete FOTO next session.   Patient Stated Goals To regain strength in hand and wrist.   Currently in Pain? No/denies  5-6 pain level with movement; aching           [facility] OT Assessment - 06/29/16 [id number]  Assessment   Diagnosis Left radius fracture   Referring Provider Iran Planas, MD   Onset Date 05/21/16   Assessment 07/22/16   Prior Therapy None     Precautions   Precautions Other (comment)   Precaution Comments Splint on at all times except to bath and do HEP. Do not begin grip strengthening until 6 weeks post op (07/07/16). Begin weaning from brace at 6 weeks. Go 1-2 hours without. Be brace free by 7-10 days. Begin wrist strengthening at 6-8 weeks.    Required Braces or Orthoses Other Brace/Splint   Other Brace/Splint resting hand brace     Restrictions   Weight Bearing Restrictions Yes     Balance Screen    Has the patient fallen in the past 6 months Yes   How many times? 1   Has the patient had a decrease in activity level because of a fear of falling?  No   Is the patient reluctant to leave their home because of a fear of falling?  No     Home  Environment   Family/patient expects to be discharged to: Private residence     Prior Function   Level of Independence Independent     ADL   ADL comments Difficulty completing any task with left hand at this time.      Mobility   Mobility Status Independent     Written Expression   Dominant Hand Right     Vision - History   Baseline Vision Wears glasses all the time     Cognition   Overall Cognitive Status Within Functional Limits for tasks assessed     Sensation   Light Touch Appears Intact   Hot/Cold Appears Intact     Coordination   9 Hole Peg Test Left;Right   Right 9 Hole Peg Test 22.1"   Left 9 Hole Peg Test 33.7"     Edema   Edema No noticeable edema present.     ROM / Strength   AROM / PROM / Strength AROM;PROM;Strength     Palpation   Palpation comment Min fascial restrictions in distal wrist region.     AROM   Overall AROM Comments Assessed seated.    AROM Assessment Site Wrist;Forearm   Right/Left Forearm Left   Left Forearm Pronation 90 Degrees   Left Forearm Supination 70 Degrees   Right/Left Wrist Left   Left Wrist Extension 16 Degrees   Left Wrist Flexion 22 Degrees   Left Wrist Radial Deviation 28 Degrees   Left Wrist Ulnar Deviation 16 Degrees     PROM   Overall PROM Comments Assessed seated.   PROM Assessment Site Wrist;Forearm   Right/Left Forearm Left   Left Forearm Pronation 90 Degrees   Left Forearm Supination 76 Degrees   Right/Left Wrist Left   Left Wrist Extension 36 Degrees   Left Wrist Flexion 32 Degrees   Left Wrist Radial Deviation 30 Degrees   Left Wrist Ulnar Deviation 16 Degrees     Strength   Overall Strength Comments Assessed seated.    Strength Assessment Site  Hand;Forearm;Wrist   Right/Left Forearm Left   Left Forearm Pronation 3/5   Left Forearm Supination 3/5   Right/Left Wrist Left   Left Wrist Flexion 3-/5   Left Wrist Extension 3-/5   Left Wrist Radial Deviation 3-/5   Left Wrist Ulnar Deviation 3-/5   Right/Left hand Left;Right   Right Hand Grip (lbs) 50   Right Hand Lateral Pinch 10 lbs  Right Hand 3 Point Pinch 10 lbs   Left Hand Grip (lbs) 15   Left Hand Lateral Pinch 8 lbs   Left Hand 3 Point Pinch 2 lbs                         OT Education - 06/29/16 1011    Education provided Yes   Education Details A/ROM wrist, forearm, and elbow exercises.    Person(s) Educated Patient   Methods Explanation;Demonstration;Verbal cues;Handout   Comprehension Returned demonstration;Verbalized understanding          OT Short Term Goals - 06/29/16 1033      OT SHORT TERM GOAL #1   Title Patient will be educated and independent with HEP to increase functional performance with LUE during daily tasks.    Time 4   Period Weeks   Status New     OT SHORT TERM GOAL #2   Title Patient will increase A/ROM to WNL to increase ability to complete housekeeping tasks.    Time 4   Period Weeks   Status New     OT SHORT TERM GOAL #3   Title Patient will increase grip strength by 10# and pinch strength by 4# to increase ability to hold onto lightweight objects without dropping.   Time 4   Period Weeks   Status New     OT SHORT TERM GOAL #4   Title Patient will decrease pain in left wrist during functional mobiliy tasks to 3/10 or less.    Time 4   Period Weeks   Status New           OT Long Term Goals - 06/29/16 1036      OT LONG TERM GOAL #1   Title Patient will return to highest level of independence with all daily tasks using LUE.   Time 8   Period Weeks   Status New     OT LONG TERM GOAL #2   Title Patient will decrease fascial restrictions to trace amount over incision and wrist region to increase  functional mobility.    Time 8   Period Weeks   Status New     OT LONG TERM GOAL #3   Title Patient will increase strength in wrist to 4/5 to increase ability to lift weighted household items.   Time 8   Period Weeks   Status New     OT LONG TERM GOAL #4   Title Patient will increase grip strength by 15# and pinch strength by 5# to increase ability to pick up and hold onto items such as cups and plates when washing dishes.   Time 8   Period Weeks   Status New     OT LONG TERM GOAL #5   Title Patient will decrease pain during movement and use in left wrist to 1/10 or less.    Time 8   Period Weeks   Status New               Plan - 06/29/16 1019    Clinical Impression Statement A: Patient is a 60 y/o female S/P left radius fracture 5 weeks post op causing increase pain with movement/use, fascial restrictions and decreased strength and ROM resulting in difficulty completing any daily tasks with LUE.   Rehab Potential Excellent   OT Frequency 2x / week   OT Duration 8 weeks   OT Treatment/Interventions Self-care/ADL training;Therapeutic exercise;Patient/family education;Therapeutic activities;DME and/or AE instruction;Cryotherapy;Electrical Stimulation;Moist  Heat;Passive range of motion;Ultrasound;Manual Therapy;Splinting   Plan P: Patient will benefit from skilled OT services to increase functional performance during daily tasks using LUE. Treatment Plan: myofascial release, manual stretching, A/ROM and P/ROM. Follow standard protocol in Doc Flowsheet.    OT Home Exercise Plan 10/25: A/ROM wrist, elbow, and forearm exercises.    Consulted and Agree with Plan of Care Patient      Patient will benefit from skilled therapeutic intervention in order to improve the following deficits and impairments:  Decreased strength, Pain, Decreased range of motion, Impaired UE functional use, Increased fascial restricitons, Decreased coordination  Visit Diagnosis: Pain in left wrist -  Plan: Ot plan of care cert/re-cert  Other symptoms and signs involving the musculoskeletal system - Plan: Ot plan of care cert/re-cert  Stiffness of left wrist, not elsewhere classified - Plan: Ot plan of care cert/re-cert      G-Codes - [id number] [id number]    Functional Assessment Tool Used clinical judgement   Functional Limitation Carrying, moving and handling objects   Carrying, Moving and Handling Objects Current Status [phone]) At least 80 percent but less than 100 percent impaired, limited or restricted   Carrying, Moving and Handling Objects Goal Status [id number]) At least 40 percent but less than 60 percent impaired, limited or restricted      Problem List Patient Active Problem List   Diagnosis Date Noted  . Mucosal abnormality of stomach   . Nausea with vomiting 11/04/2015  . Abdominal pain, epigastric 09/17/2015  . Peritonitis ([place]) 08/27/2015  . Pain following surgery or procedure 08/22/2015  . Abdominal pain 08/01/2015  . Nausea 08/01/2015  . Abdominal mass 08/01/2015  . Schizoaffective disorder ([place]) 05/15/2014  . Hepatic cirrhosis ([place]) 10/18/2013  . LLQ pain 08/13/2013  . DEPRESSION 04/08/2008  . PUD 04/08/2008  . DIVERTICULOSIS, COLON 04/08/2008  . CONSTIPATION 04/08/2008  . MORBID OBESITY, HX OF 04/08/2008   Ailene Ravel, OTR/L,CBIS  [phone]  06/29/2016, 10:42 AM  [facility] [address] [place], [place], [address] Phone: [phone]   Fax:  [phone]  Name: [name] MRN: [id number] Date of Birth: 08-29-1956

## 2016-06-29 NOTE — Patient Instructions (Signed)
AROM: Wrist Flexion / Extension  Actively bend right wrist forward then back as far as possible. Repeat _10___ times per set. Do _1___ sets per session. Do _2-3___ sessions per day.  Copyright  VHI. All rights reserved.  AROM: Wrist Radial / Ulnar Deviation   Gently bend left wrist from side to side as far as possible. Repeat _10___ times per set. Do __1__ sets per session. Do _2-3___ sessions per day.  Copyright  VHI. All rights reserved.  AROM: Forearm Pronation / Supination   With right arm in handshake position, slowly rotate palm down until stretch is felt. Relax. Then rotate palm up until stretch is felt. Repeat _10___ times per set. Do __1__ sets per session. Do _2-3___ sessions per day.  Copyright  VHI. All rights reserved.    ELBOW FLEXION EXTENSION  Bend your elbow upwards as shown and then lower to a straighten position. Complete 10 times. 2-3 times a day.

## 2016-07-01 ENCOUNTER — Encounter (HOSPITAL_COMMUNITY): Payer: Self-pay

## 2016-07-01 ENCOUNTER — Ambulatory Visit (HOSPITAL_COMMUNITY): Payer: Medicare Other

## 2016-07-01 DIAGNOSIS — M25532 Pain in left wrist: Secondary | ICD-10-CM | POA: Diagnosis not present

## 2016-07-01 DIAGNOSIS — R29898 Other symptoms and signs involving the musculoskeletal system: Secondary | ICD-10-CM

## 2016-07-01 DIAGNOSIS — M25632 Stiffness of left wrist, not elsewhere classified: Secondary | ICD-10-CM

## 2016-07-01 NOTE — Therapy (Signed)
[place], [place], [address] Phone: [phone]   Fax:  [phone]  Occupational Therapy Treatment  Patient Details  Name: [name] MRN: [id number] Date of Birth: 1956-01-14 Referring Provider: Iran Planas, MD  Encounter Date: 07/01/2016      OT End of Session - 07/01/16 1134    Visit Number 2   Number of Visits 16   Date for OT Re-Evaluation 07/29/16   Authorization Type U[employer] Medicare   Authorization Time Period before 10th visit   Authorization - Visit Number 2   Authorization - Number of Visits 10   OT Start Time 1033   OT Stop Time 1115   OT Time Calculation (min) 42 min   Activity Tolerance Patient tolerated treatment well   Behavior During Therapy [facility] for tasks assessed/performed      Past Medical History:  Diagnosis Date  . Chronic constipation   . Cirrhosis ([place])    child pugh class A and MELD 5, completed hep A and B vaccine series, no HCC on 3/16 MRI  . Colonic inertia   . Depression   . Diverticula of colon   . Diverticulosis 01/31/2008   colonoscopy Dr Gala Romney  . Hyperplastic colon polyp   . Liver mass   . Obesity   . PUD (peptic ulcer disease) 1988  . Schizoaffective disorder    Mental Health-Dr Elias Else    Past Surgical History:  Procedure Laterality Date  . ABDOMINAL HYSTERECTOMY    . CHOLECYSTECTOMY  08/2010   cholelithiasis; Dr Arnoldo Morale  . COLONOSCOPY  01/31/2008   Dr. Gala Romney- normal rectum, L sided diverticula, long tortuous colon. hyperplasitic  polyp.  Marland Kitchen ERCP W/ SPHINCTEROTOMY AND BALLOON DILATION  08/05/2010   normal ampulla/mild erythema in the gastric remnant without ulcerations or erosions. normal retroflexed view of the cardia/anastomosis normal/distal common bile duct stones s/p extraction and shincterotomy  . ESOPHAGOGASTRODUODENOSCOPY N/A 10/24/2013   [id number] esophagus. Surgically altered stomach. Abnormal gastric mucosa  -  status post biopsy (reactive  gastrophathy)  . ESOPHAGOGASTRODUODENOSCOPY (EGD) WITH PROPOFOL N/A 11/30/2015   Procedure: ESOPHAGOGASTRODUODENOSCOPY (EGD) WITH PROPOFOL;  Surgeon: Daneil Dolin, MD;  [facility];  Service: Endoscopy;  Laterality: N/A;  730   . EUS N/A 08/20/2015   Dr. Ardis Hughs: well-circumscribed 5.2 cm chronic mass in porta hepatis, internally mass is gelatinous. Small distal esophageal varices (limited view), Bilroth 1 type anatomy, cytology NEGATIVE for malignant cells. Suspicion for neoplasm low   . FOOT SURGERY     left  . GASTRIC BYPASS     with Revision, Dr. Duffy Rhody  . S/P Hysterectomy     partial  . TONSILLECTOMY    . WRIST SURGERY      There were no vitals filed for this visit.      Subjective Assessment - 07/01/16 1057    Subjective  S: I haven't taken anything for pain since 2 days after the surgery.   Special Tests FOTO score: 60/100   Currently in Pain? No/denies            [facility] OT Assessment - 07/01/16 1058      Assessment   Diagnosis Left radius fracture     Precautions   Precautions Other (comment)   Precaution Comments Splint on at all times except to bath and do HEP. Do not begin grip strengthening until 6 weeks post op (07/07/16). Begin weaning from brace at 6 weeks. Go 1-2 hours without. Be brace free by  7-10 days. Begin wrist strengthening at 6-8 weeks.    Required Braces or Orthoses Other Brace/Splint   Other Brace/Splint resting hand brace                  OT Treatments/Exercises (OP) - 07/01/16 1058      Exercises   Exercises Wrist;Hand     Wrist Exercises   Forearm Supination PROM;AROM;10 reps   Forearm Pronation PROM;AROM;10 reps   Wrist Flexion PROM;AROM;10 reps   Wrist Extension PROM;AROM;10 reps   Wrist Radial Deviation AROM;PROM;10 reps   Wrist Ulnar Deviation PROM;AROM;10 reps     Hand Exercises   Other Hand Exercises Pt utilized tweezers to complete colored pegboard pattern focusing on gentle functional pinching and  wrist mobility. Patient had mod-max difficulty with sustained pinch strength. Only able to complete half of board.     Fine Motor Coordination   Fine Motor Coordination Grooved pegs   Grooved pegs Patient picked up 5 pegs on at a time and placed them in the grooved pegboard. mod difficulty to not drop pegs. Once all pegs were placed she picked up as many pegs as she could hold without dropping them.      Manual Therapy   Manual Therapy Myofascial release   Manual therapy comments Manual therapy completed prior to exercises.    Myofascial Release Myofascial release and manual stretching completed to left forearm over healed incision to decrease fascial restrictions and increase jont mobility in a pain free zone.                 OT Education - 07/01/16 1134    Education provided Yes   Education Details Pt was given OT eval print out. Goals and plan of care reviewed.    Person(s) Educated Patient   Methods Explanation;Handout   Comprehension Verbalized understanding          OT Short Term Goals - 07/01/16 1113      OT SHORT TERM GOAL #1   Title Patient will be educated and independent with HEP to increase functional performance with LUE during daily tasks.    Time 4   Period Weeks   Status On-going     OT SHORT TERM GOAL #2   Title Patient will increase A/ROM to WNL to increase ability to complete housekeeping tasks.    Time 4   Period Weeks   Status On-going     OT SHORT TERM GOAL #3   Title Patient will increase grip strength by 10# and pinch strength by 4# to increase ability to hold onto lightweight objects without dropping.   Time 4   Period Weeks   Status On-going     OT SHORT TERM GOAL #4   Title Patient will decrease pain in left wrist during functional mobiliy tasks to 3/10 or less.    Time 4   Period Weeks   Status On-going           OT Long Term Goals - 07/01/16 1113      OT LONG TERM GOAL #1   Title Patient will return to highest level of  independence with all daily tasks using LUE.   Time 8   Period Weeks   Status On-going     OT LONG TERM GOAL #2   Title Patient will decrease fascial restrictions to trace amount over incision and wrist region to increase functional mobility.    Time 8   Period Weeks   Status On-going  OT LONG TERM GOAL #3   Title Patient will increase strength in wrist to 4/5 to increase ability to lift weighted household items.   Time 8   Period Weeks   Status On-going     OT LONG TERM GOAL #4   Title Patient will increase grip strength by 15# and pinch strength by 5# to increase ability to pick up and hold onto items such as cups and plates when washing dishes.   Time 8   Period Weeks   Status On-going     OT LONG TERM GOAL #5   Title Patient will decrease pain during movement and use in left wrist to 1/10 or less.    Time 8   Period Weeks   Status On-going               Plan - 07/01/16 1135    Clinical Impression Statement A: Initiated myofascial release, manual stretching, P/ROM, A/ROM , and gentle coordination tasks. Pt had mod-max difficulty with tweexers and in hand manipulation tasks due to muscle weakness. No pain noted during tx session.    Plan P: Begin gentle grip and pinch strengthening. Continue to follow standard protocol.      Patient will benefit from skilled therapeutic intervention in order to improve the following deficits and impairments:  Decreased strength, Pain, Decreased range of motion, Impaired UE functional use, Increased fascial restricitons, Decreased coordination  Visit Diagnosis: Other symptoms and signs involving the musculoskeletal system  Stiffness of left wrist, not elsewhere classified    Problem List Patient Active Problem List   Diagnosis Date Noted  . Mucosal abnormality of stomach   . Nausea with vomiting 11/04/2015  . Abdominal pain, epigastric 09/17/2015  . Peritonitis ([place]) 08/27/2015  . Pain following surgery or procedure  08/22/2015  . Abdominal pain 08/01/2015  . Nausea 08/01/2015  . Abdominal mass 08/01/2015  . Schizoaffective disorder ([place]) 05/15/2014  . Hepatic cirrhosis ([place]) 10/18/2013  . LLQ pain 08/13/2013  . DEPRESSION 04/08/2008  . PUD 04/08/2008  . DIVERTICULOSIS, COLON 04/08/2008  . CONSTIPATION 04/08/2008  . MORBID OBESITY, HX OF 04/08/2008   Ailene Ravel, OTR/L,CBIS  [phone]  07/01/2016, 11:40 AM  [facility] [address] [place], [place], [address] Phone: [phone]   Fax:  [phone]  Name: [name] MRN: [id number] Date of Birth: Jan 11, 1956

## 2016-07-06 ENCOUNTER — Encounter (HOSPITAL_COMMUNITY): Payer: Self-pay

## 2016-07-06 ENCOUNTER — Ambulatory Visit (HOSPITAL_COMMUNITY): Payer: Medicare Other | Attending: Orthopedic Surgery

## 2016-07-06 DIAGNOSIS — M25632 Stiffness of left wrist, not elsewhere classified: Secondary | ICD-10-CM | POA: Diagnosis present

## 2016-07-06 DIAGNOSIS — R29898 Other symptoms and signs involving the musculoskeletal system: Secondary | ICD-10-CM

## 2016-07-06 NOTE — Therapy (Signed)
[place], [place], [address] Phone: [phone]   Fax:  [phone]  Occupational Therapy Treatment  Patient Details  Name: [name] MRN: [id number] Date of Birth: 1956/01/26 Referring Provider: Iran Planas, MD  Encounter Date: 07/06/2016      OT End of Session - 07/06/16 0915    Visit Number 3   Number of Visits 16   Date for OT Re-Evaluation 07/29/16   Authorization Type [employer] Medicare   Authorization Time Period before 10th visit   Authorization - Visit Number 3   Authorization - Number of Visits 10   OT Start Time 0818   OT Stop Time 0900   OT Time Calculation (min) 42 min   Activity Tolerance Patient tolerated treatment well   Behavior During Therapy [facility] for tasks assessed/performed      Past Medical History:  Diagnosis Date  . Chronic constipation   . Cirrhosis ([place])    child pugh class A and MELD 5, completed hep A and B vaccine series, no HCC on 3/16 MRI  . Colonic inertia   . Depression   . Diverticula of colon   . Diverticulosis 01/31/2008   colonoscopy Dr Gala Romney  . Hyperplastic colon polyp   . Liver mass   . Obesity   . PUD (peptic ulcer disease) 1988  . Schizoaffective disorder    Mental Health-Dr Elias Else    Past Surgical History:  Procedure Laterality Date  . ABDOMINAL HYSTERECTOMY    . CHOLECYSTECTOMY  08/2010   cholelithiasis; Dr Arnoldo Morale  . COLONOSCOPY  01/31/2008   Dr. Gala Romney- normal rectum, L sided diverticula, long tortuous colon. hyperplasitic  polyp.  Marland Kitchen ERCP W/ SPHINCTEROTOMY AND BALLOON DILATION  08/05/2010   normal ampulla/mild erythema in the gastric remnant without ulcerations or erosions. normal retroflexed view of the cardia/anastomosis normal/distal common bile duct stones s/p extraction and shincterotomy  . ESOPHAGOGASTRODUODENOSCOPY N/A 10/24/2013   [id number] esophagus. Surgically altered stomach. Abnormal gastric mucosa  -  status post biopsy (reactive  gastrophathy)  . ESOPHAGOGASTRODUODENOSCOPY (EGD) WITH PROPOFOL N/A 11/30/2015   Procedure: ESOPHAGOGASTRODUODENOSCOPY (EGD) WITH PROPOFOL;  Surgeon: Daneil Dolin, MD;  [facility];  Service: Endoscopy;  Laterality: N/A;  730   . EUS N/A 08/20/2015   Dr. Ardis Hughs: well-circumscribed 5.2 cm chronic mass in porta hepatis, internally mass is gelatinous. Small distal esophageal varices (limited view), Bilroth 1 type anatomy, cytology NEGATIVE for malignant cells. Suspicion for neoplasm low   . FOOT SURGERY     left  . GASTRIC BYPASS     with Revision, Dr. Duffy Rhody  . S/P Hysterectomy     partial  . TONSILLECTOMY    . WRIST SURGERY      There were no vitals filed for this visit.      Subjective Assessment - 07/06/16 0831    Subjective  S: I think it's getting better.   Currently in Pain? No/denies            [facility] OT Assessment - 07/06/16 [id number]      Assessment   Diagnosis Left radius fracture     Precautions   Precautions Other (comment)   Precaution Comments Splint on at all times except to bath and do HEP. Do not begin grip strengthening until 6 weeks post op (07/07/16). Begin weaning from brace at 6 weeks. Go 1-2 hours without. Be brace free by 7-10 days. Begin wrist strengthening at 6-8 weeks.    Required Braces or  Orthoses Other Brace/Splint   Other Brace/Splint resting hand brace                  OT Treatments/Exercises (OP) - 07/06/16 0832      Exercises   Exercises Wrist;Hand     Wrist Exercises   Forearm Supination PROM;AROM;10 reps   Forearm Pronation PROM;AROM;10 reps   Wrist Flexion PROM;AROM;10 reps   Wrist Extension PROM;AROM;10 reps   Wrist Radial Deviation AROM;PROM;10 reps   Wrist Ulnar Deviation PROM;AROM;10 reps     Additional Wrist Exercises   Sponges 13, 15   Theraputty Flatten;Roll;Pinch;Grip   Theraputty - Flatten yellow - standing   Theraputty - Roll yellow   Theraputty - Grip yellow   Theraputty - Pinch yellow    Theraputty - Locate Pegs 7 beads located - yellow   Hand Gripper with Large Beads all beads with gripper set at 11#   Hand Gripper with Medium Beads all beads with gripper set at 11#   Hand Gripper with Small Beads all beads with gripper set at 11#     Hand Exercises   Other Hand Exercises Pt utilized red resistive clothespin to pick up 12 sponges then used green resistive clothespin to pick up 18 remaining sponges.       Fine Motor Coordination   Fine Motor Coordination Picking up coins;Manipulating coins;Stacking coins   Picking up coins Pt picked up 5 coins one at a time with min difficulty.    Manipulating coins Min-mod difficulty    Stacking coins min-mod difficulty     Manual Therapy   Manual Therapy Myofascial release   Manual therapy comments Manual therapy completed prior to exercises.    Myofascial Release Myofascial release and manual stretching completed to left forearm over healed incision to decrease fascial restrictions and increase jont mobility in a pain free zone.                 OT Education - 07/06/16 0915    Education provided Yes   Education Details Educated patient to begin weaning from her brace. 1-2 hours a day remove it. The plan is to be brace free 7-10 days.    Person(s) Educated Patient   Methods Explanation   Comprehension Verbalized understanding          OT Short Term Goals - 07/01/16 1113      OT SHORT TERM GOAL #1   Title Patient will be educated and independent with HEP to increase functional performance with LUE during daily tasks.    Time 4   Period Weeks   Status On-going     OT SHORT TERM GOAL #2   Title Patient will increase A/ROM to WNL to increase ability to complete housekeeping tasks.    Time 4   Period Weeks   Status On-going     OT SHORT TERM GOAL #3   Title Patient will increase grip strength by 10# and pinch strength by 4# to increase ability to hold onto lightweight objects without dropping.   Time 4   Period  Weeks   Status On-going     OT SHORT TERM GOAL #4   Title Patient will decrease pain in left wrist during functional mobiliy tasks to 3/10 or less.    Time 4   Period Weeks   Status On-going           OT Long Term Goals - 07/01/16 1113      OT LONG TERM GOAL #1  Title Patient will return to highest level of independence with all daily tasks using LUE.   Time 8   Period Weeks   Status On-going     OT LONG TERM GOAL #2   Title Patient will decrease fascial restrictions to trace amount over incision and wrist region to increase functional mobility.    Time 8   Period Weeks   Status On-going     OT LONG TERM GOAL #3   Title Patient will increase strength in wrist to 4/5 to increase ability to lift weighted household items.   Time 8   Period Weeks   Status On-going     OT LONG TERM GOAL #4   Title Patient will increase grip strength by 15# and pinch strength by 5# to increase ability to pick up and hold onto items such as cups and plates when washing dishes.   Time 8   Period Weeks   Status On-going     OT LONG TERM GOAL #5   Title Patient will decrease pain during movement and use in left wrist to 1/10 or less.    Time 8   Period Weeks   Status On-going               Plan - 07/06/16 [id number]    Clinical Impression Statement A: Initiated grip and pinch strengthening this session. patient had no reports of pain although occassional muscle fatigue.    Plan P: Update HEP for strengthening. Begin wrist strengthening with 1# weight.      Patient will benefit from skilled therapeutic intervention in order to improve the following deficits and impairments:  Decreased strength, Pain, Decreased range of motion, Impaired UE functional use, Increased fascial restricitons, Decreased coordination  Visit Diagnosis: Other symptoms and signs involving the musculoskeletal system  Stiffness of left wrist, not elsewhere classified    Problem List Patient Active Problem  List   Diagnosis Date Noted  . Mucosal abnormality of stomach   . Nausea with vomiting 11/04/2015  . Abdominal pain, epigastric 09/17/2015  . Peritonitis ([place]) 08/27/2015  . Pain following surgery or procedure 08/22/2015  . Abdominal pain 08/01/2015  . Nausea 08/01/2015  . Abdominal mass 08/01/2015  . Schizoaffective disorder ([place]) 05/15/2014  . Hepatic cirrhosis ([place]) 10/18/2013  . LLQ pain 08/13/2013  . DEPRESSION 04/08/2008  . PUD 04/08/2008  . DIVERTICULOSIS, COLON 04/08/2008  . CONSTIPATION 04/08/2008  . MORBID OBESITY, HX OF 04/08/2008   Ailene Ravel, OTR/L,CBIS  [phone]  07/06/2016, 9:23 AM  [facility] [address] [place], [place], [address] Phone: [phone]   Fax:  [phone]  Name: [name] MRN: [id number] Date of Birth: 1956/06/09

## 2016-07-08 ENCOUNTER — Ambulatory Visit (HOSPITAL_COMMUNITY): Payer: Medicare Other

## 2016-07-08 ENCOUNTER — Encounter (HOSPITAL_COMMUNITY): Payer: Self-pay

## 2016-07-08 DIAGNOSIS — R29898 Other symptoms and signs involving the musculoskeletal system: Secondary | ICD-10-CM | POA: Diagnosis not present

## 2016-07-08 DIAGNOSIS — M25632 Stiffness of left wrist, not elsewhere classified: Secondary | ICD-10-CM

## 2016-07-08 NOTE — Patient Instructions (Signed)
  Complete 15 repetitions 2-3 times a day.   ELASTIC BAND WRIST CURLS  Rest your forearm on your thigh or table.  Next, while holding an elastic band, bend your wrist upwards with your palm face up.       ELASTIC BAND WRIST EXTENSION   Rest your forearm on your thigh or table.  Next, while holding an elastic band, bend your wrist upwards with your palm face down.       ELASTIC BAND ULNAR DEVIATION  While holding an elastic band the wrist towards the side as shown.  Hold one end of the elastic band with one and stabilize by holding the band about 1 foot away.   Move your target wrist side to side as shown.         ELASTIC BAND RADIAL DEVIATION - 2  While holding an elastic band, bend the top wrist uppward as shown. The lower hand should remain still.          Finger extension  Expand a theraband wrapped around your fingers.

## 2016-07-08 NOTE — Therapy (Signed)
[name], [place], [address] Phone: [phone]   Fax:  [phone]  Occupational Therapy Treatment  Patient Details  Name: [name] MRN: [id number] Date of Birth: 13-Jul-1956 Referring Provider: Iran Planas, MD  Encounter Date: 07/08/2016      OT End of Session - 07/08/16 0937    Visit Number 4   Number of Visits 16   Date for OT Re-Evaluation 07/29/16   Authorization Type [employer] Medicare   Authorization Time Period before 10th visit   Authorization - Visit Number 4   Authorization - Number of Visits 10   OT Start Time 0815   OT Stop Time 0900   OT Time Calculation (min) 45 min   Activity Tolerance Patient tolerated treatment well   Behavior During Therapy [facility] for tasks assessed/performed      Past Medical History:  Diagnosis Date  . Chronic constipation   . Cirrhosis ([place])    child pugh class A and MELD 5, completed hep A and B vaccine series, no HCC on 3/16 MRI  . Colonic inertia   . Depression   . Diverticula of colon   . Diverticulosis 01/31/2008   colonoscopy Dr Gala Romney  . Hyperplastic colon polyp   . Liver mass   . Obesity   . PUD (peptic ulcer disease) 1988  . Schizoaffective disorder    Mental Health-Dr Elias Else    Past Surgical History:  Procedure Laterality Date  . ABDOMINAL HYSTERECTOMY    . CHOLECYSTECTOMY  08/2010   cholelithiasis; Dr Arnoldo Morale  . COLONOSCOPY  01/31/2008   Dr. Gala Romney- normal rectum, L sided diverticula, long tortuous colon. hyperplasitic  polyp.  Marland Kitchen ERCP W/ SPHINCTEROTOMY AND BALLOON DILATION  08/05/2010   normal ampulla/mild erythema in the gastric remnant without ulcerations or erosions. normal retroflexed view of the cardia/anastomosis normal/distal common bile duct stones s/p extraction and shincterotomy  . ESOPHAGOGASTRODUODENOSCOPY N/A 10/24/2013   [id number] esophagus. Surgically altered stomach. Abnormal gastric mucosa  -  status post biopsy (reactive  gastrophathy)  . ESOPHAGOGASTRODUODENOSCOPY (EGD) WITH PROPOFOL N/A 11/30/2015   Procedure: ESOPHAGOGASTRODUODENOSCOPY (EGD) WITH PROPOFOL;  Surgeon: Daneil Dolin, MD;  [facility];  Service: Endoscopy;  Laterality: N/A;  730   . EUS N/A 08/20/2015   Dr. Ardis Hughs: well-circumscribed 5.2 cm chronic mass in porta hepatis, internally mass is gelatinous. Small distal esophageal varices (limited view), Bilroth 1 type anatomy, cytology NEGATIVE for malignant cells. Suspicion for neoplasm low   . FOOT SURGERY     left  . GASTRIC BYPASS     with Revision, Dr. Duffy Rhody  . S/P Hysterectomy     partial  . TONSILLECTOMY    . WRIST SURGERY      There were no vitals filed for this visit.      Subjective Assessment - 07/08/16 0837    Subjective  S: I'm going 5 hours without the brace on.   Currently in Pain? No/denies   Multiple Pain Sites No            [facility] OT Assessment - 07/08/16 0823      Assessment   Diagnosis Left radius fracture     Precautions   Precautions Other (comment)   Precaution Comments Splint on at all times except to bath and do HEP. Do not begin grip strengthening until 6 weeks post op (07/07/16). Begin weaning from brace at 6 weeks. Go 1-2 hours without. Be brace free by 7-10 days. Begin wrist strengthening  at 6-8 weeks.                   OT Treatments/Exercises (OP) - 07/08/16 0825      Exercises   Exercises Wrist;Hand     Wrist Exercises   Forearm Supination PROM;Strengthening;10 reps   Bar Weights/Barbell (Forearm Supination) 1 lb   Forearm Pronation PROM;Strengthening;10 reps   Bar Weights/Barbell (Forearm Pronation) 1 lb   Wrist Flexion PROM;Strengthening;10 reps   Bar Weights/Barbell (Wrist Flexion) 1 lb   Wrist Extension PROM;Strengthening;10 reps   Bar Weights/Barbell (Wrist Extension) 1 lb   Wrist Radial Deviation PROM;Strengthening;10 reps   Bar Weights/Barbell (Radial Deviation) 1 lb   Wrist Ulnar Deviation  PROM;Strengthening;10 reps   Bar Weights/Barbell (Ulnar Deviation) 1 lb     Additional Wrist Exercises   Theraputty Flatten;Roll;Pinch;Grip   Theraputty - Flatten yellow - standing   Theraputty - Roll yellow   Theraputty - Grip yellow   Theraputty - Pinch yellow - 3 point and lateral     Hand Exercises   Other Hand Exercises Using PVC pipe patient was able to cut out small circles in yellow putty while focusing on wrist flexion and extension   Other Hand Exercises Pt utilized green resistive clothespin to pick up 12 spoges using 3 point pinch until unable then transitioned to lateral pinch. Then completed 18 sponges using red clothespin with 3 point pinch.       Manual Therapy   Manual Therapy Myofascial release   Manual therapy comments Manual therapy completed prior to exercises.    Myofascial Release Myofascial release and manual stretching completed to left forearm over healed incision to decrease fascial restrictions and increase jont mobility in a pain free zone.                 OT Education - 07/08/16 [phone]    Education provided Yes   Education Details Wrist strengthening with red theraband   Person(s) Educated Patient   Methods Explanation;Demonstration;Verbal cues;Handout   Comprehension Returned demonstration;Verbalized understanding          OT Short Term Goals - 07/01/16 1113      OT SHORT TERM GOAL #1   Title Patient will be educated and independent with HEP to increase functional performance with LUE during daily tasks.    Time 4   Period Weeks   Status On-going     OT SHORT TERM GOAL #2   Title Patient will increase A/ROM to WNL to increase ability to complete housekeeping tasks.    Time 4   Period Weeks   Status On-going     OT SHORT TERM GOAL #3   Title Patient will increase grip strength by 10# and pinch strength by 4# to increase ability to hold onto lightweight objects without dropping.   Time 4   Period Weeks   Status On-going     OT  SHORT TERM GOAL #4   Title Patient will decrease pain in left wrist during functional mobiliy tasks to 3/10 or less.    Time 4   Period Weeks   Status On-going           OT Long Term Goals - 07/01/16 1113      OT LONG TERM GOAL #1   Title Patient will return to highest level of independence with all daily tasks using LUE.   Time 8   Period Weeks   Status On-going     OT LONG TERM GOAL #2   Title  Patient will decrease fascial restrictions to trace amount over incision and wrist region to increase functional mobility.    Time 8   Period Weeks   Status On-going     OT LONG TERM GOAL #3   Title Patient will increase strength in wrist to 4/5 to increase ability to lift weighted household items.   Time 8   Period Weeks   Status On-going     OT LONG TERM GOAL #4   Title Patient will increase grip strength by 15# and pinch strength by 5# to increase ability to pick up and hold onto items such as cups and plates when washing dishes.   Time 8   Period Weeks   Status On-going     OT LONG TERM GOAL #5   Title Patient will decrease pain during movement and use in left wrist to 1/10 or less.    Time 8   Period Weeks   Status On-going               Plan - 07/08/16 0937    Clinical Impression Statement A: Progressed to wrist strengthening exercises and updated HEP. patient had muscle fatigue noted during pinch strengthening activity. Required VC to take rest breaks and to slow down for proper form during strengthening exercises.    Plan P: D/C myofascial release. Follow up on updated HEP and complete exercises in clinic. Increase repetitions with 1# weight.       Patient will benefit from skilled therapeutic intervention in order to improve the following deficits and impairments:  Decreased strength, Pain, Decreased range of motion, Impaired UE functional use, Increased fascial restricitons, Decreased coordination  Visit Diagnosis: Other symptoms and signs involving the  musculoskeletal system  Stiffness of left wrist, not elsewhere classified    Problem List Patient Active Problem List   Diagnosis Date Noted  . Mucosal abnormality of stomach   . Nausea with vomiting 11/04/2015  . Abdominal pain, epigastric 09/17/2015  . Peritonitis ([place]) 08/27/2015  . Pain following surgery or procedure 08/22/2015  . Abdominal pain 08/01/2015  . Nausea 08/01/2015  . Abdominal mass 08/01/2015  . Schizoaffective disorder ([place]) 05/15/2014  . Hepatic cirrhosis ([place]) 10/18/2013  . LLQ pain 08/13/2013  . DEPRESSION 04/08/2008  . PUD 04/08/2008  . DIVERTICULOSIS, COLON 04/08/2008  . CONSTIPATION 04/08/2008  . MORBID OBESITY, HX OF 04/08/2008   Ailene Ravel, OTR/L,CBIS  [phone]  07/08/2016, 9:40 AM  [facility] [address] [place], [place], [address] Phone: [phone]   Fax:  [phone]  Name: SHENY CALENDINE MRN: [id number] Date of Birth: February 11, 1956

## 2016-07-11 ENCOUNTER — Telehealth (HOSPITAL_COMMUNITY): Payer: Self-pay | Admitting: Pulmonary Disease

## 2016-07-11 ENCOUNTER — Ambulatory Visit (HOSPITAL_COMMUNITY): Payer: Medicare Other | Admitting: Specialist

## 2016-07-11 NOTE — Telephone Encounter (Signed)
07/11/16 pt cx today's appt because she said she had an appt with Dr. Luan Pulling at the same time today

## 2016-07-14 ENCOUNTER — Telehealth (HOSPITAL_COMMUNITY): Payer: Self-pay | Admitting: Specialist

## 2016-07-14 ENCOUNTER — Encounter (HOSPITAL_COMMUNITY): Payer: Self-pay

## 2016-07-14 ENCOUNTER — Ambulatory Visit (HOSPITAL_COMMUNITY): Payer: Medicare Other

## 2016-07-14 DIAGNOSIS — R29898 Other symptoms and signs involving the musculoskeletal system: Secondary | ICD-10-CM | POA: Diagnosis not present

## 2016-07-14 NOTE — Therapy (Signed)
[place], [place], [address] Phone: [phone]   Fax:  [phone]  Occupational Therapy Treatment  Patient Details  Name: [name] MRN: [id number] Date of Birth: 1956/03/28 Referring Provider: Iran Planas, MD  Encounter Date: 07/14/2016      OT End of Session - 07/14/16 0957    Visit Number 5   Number of Visits 16   Date for OT Re-Evaluation 07/29/16   Authorization Type U[employer] Medicare   Authorization Time Period before 10th visit   Authorization - Visit Number 5   Authorization - Number of Visits 10   OT Start Time [phone]   OT Stop Time 1030   OT Time Calculation (min) 44 min   Activity Tolerance Patient tolerated treatment well   Behavior During Therapy [facility] for tasks assessed/performed      Past Medical History:  Diagnosis Date  . Chronic constipation   . Cirrhosis ([place])    child pugh class A and MELD 5, completed hep A and B vaccine series, no HCC on 3/16 MRI  . Colonic inertia   . Depression   . Diverticula of colon   . Diverticulosis 01/31/2008   colonoscopy Dr Gala Romney  . Hyperplastic colon polyp   . Liver mass   . Obesity   . PUD (peptic ulcer disease) 1988  . Schizoaffective disorder    Mental Health-Dr Elias Else    Past Surgical History:  Procedure Laterality Date  . ABDOMINAL HYSTERECTOMY    . CHOLECYSTECTOMY  08/2010   cholelithiasis; Dr Arnoldo Morale  . COLONOSCOPY  01/31/2008   Dr. Gala Romney- normal rectum, L sided diverticula, long tortuous colon. hyperplasitic  polyp.  Marland Kitchen ERCP W/ SPHINCTEROTOMY AND BALLOON DILATION  08/05/2010   normal ampulla/mild erythema in the gastric remnant without ulcerations or erosions. normal retroflexed view of the cardia/anastomosis normal/distal common bile duct stones s/p extraction and shincterotomy  . ESOPHAGOGASTRODUODENOSCOPY N/A 10/24/2013   [id number] esophagus. Surgically altered stomach. Abnormal gastric mucosa  -  status post biopsy (reactive  gastrophathy)  . ESOPHAGOGASTRODUODENOSCOPY (EGD) WITH PROPOFOL N/A 11/30/2015   Procedure: ESOPHAGOGASTRODUODENOSCOPY (EGD) WITH PROPOFOL;  Surgeon: Daneil Dolin, MD;  [facility];  Service: Endoscopy;  Laterality: N/A;  730   . EUS N/A 08/20/2015   Dr. Ardis Hughs: well-circumscribed 5.2 cm chronic mass in porta hepatis, internally mass is gelatinous. Small distal esophageal varices (limited view), Bilroth 1 type anatomy, cytology NEGATIVE for malignant cells. Suspicion for neoplasm low   . FOOT SURGERY     left  . GASTRIC BYPASS     with Revision, Dr. Duffy Rhody  . S/P Hysterectomy     partial  . TONSILLECTOMY    . WRIST SURGERY      There were no vitals filed for this visit.          [facility] OT Assessment - 07/14/16 0953      Assessment   Diagnosis Left radius fracture     Precautions   Precautions Other (comment)   Precaution Comments Begin wrist strengthening                  OT Treatments/Exercises (OP) - 07/14/16 0954      Exercises   Exercises Wrist;Hand     Wrist Exercises   Forearm Supination Strengthening  12X   Bar Weights/Barbell (Forearm Supination) 1 lb   Forearm Pronation Strengthening  12X   Bar Weights/Barbell (Forearm Pronation) 1 lb   Wrist Flexion Strengthening  12X  Bar Weights/Barbell (Wrist Flexion) 1 lb   Wrist Extension Strengthening  12X   Bar Weights/Barbell (Wrist Extension) 1 lb   Wrist Radial Deviation Strengthening  12X   Bar Weights/Barbell (Radial Deviation) 1 lb   Wrist Ulnar Deviation Strengthening  12X   Bar Weights/Barbell (Ulnar Deviation) 1 lb     Additional Wrist Exercises   Theraputty Flatten;Roll;Pinch;Grip   Theraputty - Flatten red- standing   Theraputty - Roll red   Theraputty - Grip red   Theraputty - Pinch red- 3 point and lateral   Theraputty - Locate Pegs 6 beads located - red   Hand Gripper with Large Beads all beads with gripper set at 15#   Hand Gripper with Medium Beads all beads  with gripper set at 15#   Hand Gripper with Small Beads all beads with gripper set at 15#     Hand Exercises   Other Hand Exercises Using PVC pipe patient was able to cut out small circles in red putty while focusing on wrist flexion and extension     Fine Motor Coordination   Fine Motor Coordination Grooved pegs   Grooved pegs Pt picked up 5 pegs at a time and placed them one at a time into pegboard; min-mod difficulty to keep pegs from dropping. Pt then removed pegs one at a time and was able to hold all pegs without dropping.                   OT Short Term Goals - 07/01/16 1113      OT SHORT TERM GOAL #1   Title Patient will be educated and independent with HEP to increase functional performance with LUE during daily tasks.    Time 4   Period Weeks   Status On-going     OT SHORT TERM GOAL #2   Title Patient will increase A/ROM to WNL to increase ability to complete housekeeping tasks.    Time 4   Period Weeks   Status On-going     OT SHORT TERM GOAL #3   Title Patient will increase grip strength by 10# and pinch strength by 4# to increase ability to hold onto lightweight objects without dropping.   Time 4   Period Weeks   Status On-going     OT SHORT TERM GOAL #4   Title Patient will decrease pain in left wrist during functional mobiliy tasks to 3/10 or less.    Time 4   Period Weeks   Status On-going           OT Long Term Goals - 07/01/16 1113      OT LONG TERM GOAL #1   Title Patient will return to highest level of independence with all daily tasks using LUE.   Time 8   Period Weeks   Status On-going     OT LONG TERM GOAL #2   Title Patient will decrease fascial restrictions to trace amount over incision and wrist region to increase functional mobility.    Time 8   Period Weeks   Status On-going     OT LONG TERM GOAL #3   Title Patient will increase strength in wrist to 4/5 to increase ability to lift weighted household items.   Time 8    Period Weeks   Status On-going     OT LONG TERM GOAL #4   Title Patient will increase grip strength by 15# and pinch strength by 5# to increase ability to pick up and  hold onto items such as cups and plates when washing dishes.   Time 8   Period Weeks   Status On-going     OT LONG TERM GOAL #5   Title Patient will decrease pain during movement and use in left wrist to 1/10 or less.    Time 8   Period Weeks   Status On-going               Plan - 07/14/16 1102    Clinical Impression Statement A: Progressed to red theraputty and increased gripper resistance. Pt was able to tolerate with minimal difficulty. VC for form and technique as needed.    Plan P: D/C myofascial release, measure for MD appointment. Increase resistance on handgripper.      Patient will benefit from skilled therapeutic intervention in order to improve the following deficits and impairments:  Decreased strength, Pain, Decreased range of motion, Impaired UE functional use, Increased fascial restricitons, Decreased coordination  Visit Diagnosis: Other symptoms and signs involving the musculoskeletal system    Problem List Patient Active Problem List   Diagnosis Date Noted  . Mucosal abnormality of stomach   . Nausea with vomiting 11/04/2015  . Abdominal pain, epigastric 09/17/2015  . Peritonitis ([place]) 08/27/2015  . Pain following surgery or procedure 08/22/2015  . Abdominal pain 08/01/2015  . Nausea 08/01/2015  . Abdominal mass 08/01/2015  . Schizoaffective disorder ([place]) 05/15/2014  . Hepatic cirrhosis ([place]) 10/18/2013  . LLQ pain 08/13/2013  . DEPRESSION 04/08/2008  . PUD 04/08/2008  . DIVERTICULOSIS, COLON 04/08/2008  . CONSTIPATION 04/08/2008  . MORBID OBESITY, HX OF 04/08/2008   Ailene Ravel, OTR/L,CBIS  [phone]  07/14/2016, 11:04 AM  [facility] [address] [place], [place], [address] Phone: [phone]   Fax:   [phone]  Name: GIULIETTE CHAPPELLE MRN: [id number] Date of Birth: 04-Jun-1956

## 2016-07-14 NOTE — Telephone Encounter (Signed)
She has another apptment on this date

## 2016-07-20 ENCOUNTER — Encounter (HOSPITAL_COMMUNITY): Payer: Self-pay

## 2016-07-20 ENCOUNTER — Ambulatory Visit (HOSPITAL_COMMUNITY): Payer: Medicare Other

## 2016-07-20 DIAGNOSIS — R29898 Other symptoms and signs involving the musculoskeletal system: Secondary | ICD-10-CM | POA: Diagnosis not present

## 2016-07-20 DIAGNOSIS — M25632 Stiffness of left wrist, not elsewhere classified: Secondary | ICD-10-CM

## 2016-07-20 NOTE — Therapy (Signed)
[facility] [address] [place], [place], [address] Phone: [phone]   Fax:  [phone]  Occupational Therapy Treatment And reassessment Patient Details  Name: [name] MRN: [id number] Date of Birth: 09/11/1955 Referring Provider: Iran Planas, MD  Encounter Date: 07/20/2016      OT End of Session - 07/20/16 0851    Visit Number 6   Number of Visits 16   Authorization Type UHC Medicare   Authorization Time Period before 10th visit   Authorization - Visit Number 6   Authorization - Number of Visits 10   OT Start Time 0815  reassess and D/C   OT Stop Time 0840   OT Time Calculation (min) 25 min   Activity Tolerance Patient tolerated treatment well   Behavior During Therapy [facility] for tasks assessed/performed      Past Medical History:  Diagnosis Date  . Chronic constipation   . Cirrhosis ([place])    child pugh class A and MELD 5, completed hep A and B vaccine series, no HCC on 3/16 MRI  . Colonic inertia   . Depression   . Diverticula of colon   . Diverticulosis 01/31/2008   colonoscopy Dr Gala Romney  . Hyperplastic colon polyp   . Liver mass   . Obesity   . PUD (peptic ulcer disease) 1988  . Schizoaffective disorder    Mental Health-Dr Elias Else    Past Surgical History:  Procedure Laterality Date  . ABDOMINAL HYSTERECTOMY    . CHOLECYSTECTOMY  08/2010   cholelithiasis; Dr Arnoldo Morale  . COLONOSCOPY  01/31/2008   Dr. Gala Romney- normal rectum, L sided diverticula, long tortuous colon. hyperplasitic  polyp.  Marland Kitchen ERCP W/ SPHINCTEROTOMY AND BALLOON DILATION  08/05/2010   normal ampulla/mild erythema in the gastric remnant without ulcerations or erosions. normal retroflexed view of the cardia/anastomosis normal/distal common bile duct stones s/p extraction and shincterotomy  . ESOPHAGOGASTRODUODENOSCOPY N/A 10/24/2013   [id number] esophagus. Surgically altered stomach. Abnormal gastric mucosa  -  status post biopsy (reactive  gastrophathy)  . ESOPHAGOGASTRODUODENOSCOPY (EGD) WITH PROPOFOL N/A 11/30/2015   Procedure: ESOPHAGOGASTRODUODENOSCOPY (EGD) WITH PROPOFOL;  Surgeon: Daneil Dolin, MD;  [facility];  Service: Endoscopy;  Laterality: N/A;  730   . EUS N/A 08/20/2015   Dr. Ardis Hughs: well-circumscribed 5.2 cm chronic mass in porta hepatis, internally mass is gelatinous. Small distal esophageal varices (limited view), Bilroth 1 type anatomy, cytology NEGATIVE for malignant cells. Suspicion for neoplasm low   . FOOT SURGERY     left  . GASTRIC BYPASS     with Revision, Dr. Duffy Rhody  . S/P Hysterectomy     partial  . TONSILLECTOMY    . WRIST SURGERY      There were no vitals filed for this visit.      Subjective Assessment - 07/20/16 0847    Subjective  S: I'm doing everything with my left arm. I don't notice any issues.   Currently in Pain? No/denies            [facility] OT Assessment - 07/20/16 0818      Assessment   Diagnosis Left radius fracture     Precautions   Precautions Other (comment)   Precaution Comments Begin wrist strengthening     Coordination   9 Hole Peg Test Left   Left 9 Hole Peg Test 26.9"  previous: 33.7"     ROM / Strength   AROM / PROM / Strength AROM;PROM;Strength     AROM  Overall AROM Comments Assessed seated.    AROM Assessment Site Forearm;Wrist   Right/Left Forearm Left   Left Forearm Supination 90 Degrees  previous: 70   Right/Left Wrist Left   Left Wrist Extension 60 Degrees  previous: 16   Left Wrist Flexion 78 Degrees  previous: 22   Left Wrist Radial Deviation 36 Degrees  previous: 28   Left Wrist Ulnar Deviation 28 Degrees  previous: 16     Strength   Overall Strength Comments Assessed seated.    Strength Assessment Site Forearm;Wrist   Right/Left Forearm Left   Left Forearm Pronation 5/5  previous: 3/5   Left Forearm Supination 5/5  previous: 3/5   Right/Left Wrist Left   Left Wrist Flexion 5/5  previous: 3-/5   Left  Wrist Extension 5/5  previous: 3-/5   Left Wrist Radial Deviation 5/5  previous: 3-/5   Left Wrist Ulnar Deviation 5/5  previous: 3-/5   Left Hand Grip (lbs) 45  previous: 15   Left Hand Lateral Pinch 10 lbs  previous: 8   Left Hand 3 Point Pinch 8 lbs  previous: 2                          OT Education - 07/20/16 0848    Education provided Yes   Education Details Discussed present HEP and frequency on when to complete it at home after discharge. Recommended patient to continue using and completing daily tasks with LUE as much as possible.   Person(s) Educated Patient   Methods Explanation   Comprehension Verbalized understanding          OT Short Term Goals - 07/20/16 0827      OT SHORT TERM GOAL #1   Title Patient will be educated and independent with HEP to increase functional performance with LUE during daily tasks.    Time 4   Period Weeks   Status Achieved     OT SHORT TERM GOAL #2   Title Patient will increase A/ROM to WNL to increase ability to complete housekeeping tasks.    Time 4   Period Weeks   Status Achieved     OT SHORT TERM GOAL #3   Title Patient will increase grip strength by 10# and pinch strength by 4# to increase ability to hold onto lightweight objects without dropping.   Time 4   Period Weeks   Status Achieved     OT SHORT TERM GOAL #4   Title Patient will decrease pain in left wrist during functional mobiliy tasks to 3/10 or less.    Time 4   Period Weeks   Status Achieved           OT Long Term Goals - 07/20/16 1324      OT LONG TERM GOAL #1   Title Patient will return to highest level of independence with all daily tasks using LUE.   Time 8   Period Weeks   Status Achieved     OT LONG TERM GOAL #2   Title Patient will decrease fascial restrictions to trace amount over incision and wrist region to increase functional mobility.    Time 8   Period Weeks   Status Achieved     OT LONG TERM GOAL #3   Title  Patient will increase strength in wrist to 4/5 to increase ability to lift weighted household items.   Time 8   Period Weeks   Status Achieved  OT LONG TERM GOAL #4   Title Patient will increase grip strength by 15# and pinch strength by 5# to increase ability to pick up and hold onto items such as cups and plates when washing dishes.   Time 8   Period Weeks   Status Achieved     OT LONG TERM GOAL #5   Title Patient will decrease pain during movement and use in left wrist to 1/10 or less.    Time 8   Period Weeks   Status Achieved               Plan - Jul 26, 2016 0851    Clinical Impression Statement A: Ressessment completed this sesson as patient has a follow up appointment with MD on Friday. Patient has met all therapy goals this date. All measurements with A/ROM and strength are WNL. Pt reports that she currently has not deficits to report and she completing all daily tasks as normal. Pt in agreement with discharge recommendation.    Plan P: D/C with HEP.   OT Home Exercise Plan 10/25: A/ROM wrist, elbow, and forearm exercises.       Patient will benefit from skilled therapeutic intervention in order to improve the following deficits and impairments:  Decreased strength, Pain, Decreased range of motion, Impaired UE functional use, Increased fascial restricitons, Decreased coordination  Visit Diagnosis: Other symptoms and signs involving the musculoskeletal system  Stiffness of left wrist, not elsewhere classified      G-Codes - 2016/07/26 0858    Functional Assessment Tool Used FOTO score: 80/100 (20% impaired)   Functional Limitation Carrying, moving and handling objects   Carrying, Moving and Handling Objects Goal Status ([id number]) At least 40 percent but less than 60 percent impaired, limited or restricted   Carrying, Moving and Handling Objects Discharge Status [id number]) At least 1 percent but less than 20 percent impaired, limited or restricted      Problem  List Patient Active Problem List   Diagnosis Date Noted  . Mucosal abnormality of stomach   . Nausea with vomiting 11/04/2015  . Abdominal pain, epigastric 09/17/2015  . Peritonitis ([place]) 08/27/2015  . Pain following surgery or procedure 08/22/2015  . Abdominal pain 08/01/2015  . Nausea 08/01/2015  . Abdominal mass 08/01/2015  . Schizoaffective disorder ([place]) 05/15/2014  . Hepatic cirrhosis ([place]) 10/18/2013  . LLQ pain 08/13/2013  . DEPRESSION 04/08/2008  . PUD 04/08/2008  . DIVERTICULOSIS, COLON 04/08/2008  . CONSTIPATION 04/08/2008  . MORBID OBESITY, HX OF 04/08/2008     OCCUPATIONAL THERAPY DISCHARGE SUMMARY  Visits from Start of Care: 6  Current functional level related to goals / functional outcomes: See above   Remaining deficits: See above   Education / Equipment: See above Plan: Patient agrees to discharge.  Patient goals were met. Patient is being discharged due to meeting the stated rehab goals.  ?????        Ailene Ravel, OTR/L,CBIS  [phone]  2016-07-26, 9:01 AM  [facility] [address] [place], [place], [address] Phone: [phone]   Fax:  [phone]  Name: CHAVIE KOLINSKI MRN: [id number] Date of Birth: 10-22-1955

## 2016-07-22 ENCOUNTER — Encounter (HOSPITAL_COMMUNITY): Payer: Medicare Other | Admitting: Specialist

## 2016-07-27 ENCOUNTER — Encounter (HOSPITAL_COMMUNITY): Payer: Medicare Other

## 2016-07-27 ENCOUNTER — Other Ambulatory Visit: Payer: Self-pay | Admitting: Nurse Practitioner

## 2016-08-03 ENCOUNTER — Encounter (HOSPITAL_COMMUNITY): Payer: Medicare Other

## 2016-08-05 ENCOUNTER — Encounter (HOSPITAL_COMMUNITY): Payer: Medicare Other

## 2016-08-10 ENCOUNTER — Encounter (HOSPITAL_COMMUNITY): Payer: Medicare Other | Admitting: Occupational Therapy

## 2016-08-12 ENCOUNTER — Encounter (HOSPITAL_COMMUNITY): Payer: Medicare Other

## 2016-08-16 ENCOUNTER — Telehealth: Payer: Self-pay | Admitting: Internal Medicine

## 2016-08-16 NOTE — Telephone Encounter (Signed)
Letter mailed

## 2016-08-16 NOTE — Telephone Encounter (Signed)
RECALL FOR ULTRASOUND 

## 2016-08-17 ENCOUNTER — Encounter (HOSPITAL_COMMUNITY): Payer: Medicare Other

## 2016-08-25 ENCOUNTER — Other Ambulatory Visit: Payer: Self-pay

## 2016-08-25 DIAGNOSIS — K7460 Unspecified cirrhosis of liver: Secondary | ICD-10-CM

## 2016-08-31 ENCOUNTER — Ambulatory Visit (HOSPITAL_COMMUNITY)
Admission: RE | Admit: 2016-08-31 | Discharge: 2016-08-31 | Disposition: A | Discharge status: Home / Self Care | Payer: Medicare Other | Source: Ambulatory Visit | Attending: Internal Medicine | Admitting: Internal Medicine

## 2016-08-31 DIAGNOSIS — K7460 Unspecified cirrhosis of liver: Secondary | ICD-10-CM | POA: Insufficient documentation

## 2016-09-02 NOTE — Progress Notes (Signed)
ON RECALL  °

## 2016-09-09 ENCOUNTER — Other Ambulatory Visit (HOSPITAL_COMMUNITY): Payer: Self-pay | Admitting: General Surgery

## 2016-09-09 DIAGNOSIS — R1906 Epigastric swelling, mass or lump: Secondary | ICD-10-CM

## 2016-09-15 ENCOUNTER — Ambulatory Visit (INDEPENDENT_AMBULATORY_CARE_PROVIDER_SITE_OTHER): Payer: Medicare Other | Admitting: Nurse Practitioner

## 2016-09-15 ENCOUNTER — Encounter: Payer: Self-pay | Admitting: Nurse Practitioner

## 2016-09-15 VITALS — BP 105/64 | HR 59 | Temp 97.6°F | Ht 64.0 in | Wt 166.0 lb

## 2016-09-15 DIAGNOSIS — K219 Gastro-esophageal reflux disease without esophagitis: Secondary | ICD-10-CM | POA: Insufficient documentation

## 2016-09-15 DIAGNOSIS — K5900 Constipation, unspecified: Secondary | ICD-10-CM | POA: Diagnosis not present

## 2016-09-15 DIAGNOSIS — K7469 Other cirrhosis of liver: Secondary | ICD-10-CM

## 2016-09-15 NOTE — Assessment & Plan Note (Signed)
Currently well-controlled on Linzess 290 g. Recommend she continue this and follow up as needed.

## 2016-09-15 NOTE — Assessment & Plan Note (Signed)
She is asymptomatic from a hepatic standpoint. She has a CT of the abdomen and pelvis scheduled tomorrow for follow-up with surgery. We will use this for her routine imaging encounter. We will order labs today including CBC, CMP, PTT/INR, AFP. So far she has been very well compensated and her liver disease. Return for follow-up in 6 months.

## 2016-09-15 NOTE — Progress Notes (Signed)
CC'D TO PCP °

## 2016-09-15 NOTE — Assessment & Plan Note (Signed)
Well-controlled on PPI. Continue PPI, return for follow-up as needed.

## 2016-09-15 NOTE — Patient Instructions (Signed)
1. Have your labs drawn when you're able to. 2. Have your's CT scan completed tomorrow. 3. Return for follow-up in 6 months. 4. Call us if you have any problems.

## 2016-09-15 NOTE — Progress Notes (Signed)
Referring Provider: Sinda Du, MD Primary Care Physician:  Alonza Bogus, MD Primary GI:  Dr. Gala Romney  Chief Complaint  Patient presents with  . Cirrhosis    HPI:   [name] is a 61 y.o. female who presents for follow-up on liver cirrhosis. The patient was last seen in our office 03/15/2016 for the same. At that time she was doing well. EGD up-to-date with last exam 11/30/2015 with no varices noted, recommended repeat in 2 years (2019). Overall no hepatic symptoms at that time. She does have a history of a hepatic mass status post EUS with aspiration biopsy deemed likely benign. Following with surgery for abdominal mass. Previous labs show normal CBC, mildly elevated creatinine at 1.11, normal liver function tests, normal INR, normal AFP. Her disease is well compensated with a calculated Meld of 8 and Child Pugh A. abdominal ultrasound notes no focal hepatic mass, mildly increased hepatic echogenicity.  Today she states she's doing well. No longer seeing Dr. Barry Dienes, has a CT scheduled for tomorrow and Dr. Barry Dienes thinks she shouldn't have to come back and see her anymore. Denies abdominal pain, N/V, hematochezia, melena, yellowing of skin/eyes, acute episodic confusion, excessive bruising/bleeding, darkened urine. Some dyspnea she thinks is related to her smoking, but at baseline for her. GERD symptoms well controlled and without breakthrough symptoms. Denies chest pain, dizziness, lightheadedness, syncope, near syncope. Denies any other upper or lower GI symptoms.  Past Medical History:  Diagnosis Date  . Chronic constipation   . Cirrhosis ([place])    child pugh class A and MELD 5, completed hep A and B vaccine series, no HCC on 3/16 MRI  . Colonic inertia   . Depression   . Diverticula of colon   . Diverticulosis 01/31/2008   colonoscopy Dr Gala Romney  . Hyperplastic colon polyp   . Liver mass   . Obesity   . PUD (peptic ulcer disease) 1988  . Schizoaffective disorder    Mental Health-Dr Elias Else  . Wrist fracture    Left wrist s/p fall and surgical repair    Past Surgical History:  Procedure Laterality Date  . ABDOMINAL HYSTERECTOMY    . CHOLECYSTECTOMY  08/2010   cholelithiasis; Dr Arnoldo Morale  . COLONOSCOPY  01/31/2008   Dr. Gala Romney- normal rectum, L sided diverticula, long tortuous colon. hyperplasitic  polyp.  Marland Kitchen ERCP W/ SPHINCTEROTOMY AND BALLOON DILATION  08/05/2010   normal ampulla/mild erythema in the gastric remnant without ulcerations or erosions. normal retroflexed view of the cardia/anastomosis normal/distal common bile duct stones s/p extraction and shincterotomy  . ESOPHAGOGASTRODUODENOSCOPY N/A 10/24/2013   [id number] esophagus. Surgically altered stomach. Abnormal gastric mucosa  -  status post biopsy (reactive gastrophathy)  . ESOPHAGOGASTRODUODENOSCOPY (EGD) WITH PROPOFOL N/A 11/30/2015   Procedure: ESOPHAGOGASTRODUODENOSCOPY (EGD) WITH PROPOFOL;  Surgeon: Daneil Dolin, MD;  Location: [facility];  Service: Endoscopy;  Laterality: N/A;  730   . EUS N/A 08/20/2015   Dr. Ardis Hughs: well-circumscribed 5.2 cm chronic mass in porta hepatis, internally mass is gelatinous. Small distal esophageal varices (limited view), Bilroth 1 type anatomy, cytology NEGATIVE for malignant cells. Suspicion for neoplasm low   . FOOT SURGERY     left  . GASTRIC BYPASS     with Revision, Dr. Duffy Rhody  . S/P Hysterectomy     partial  . TONSILLECTOMY    . WRIST SURGERY Left 05/2016    Current Outpatient Prescriptions  Medication Sig Dispense Refill  . alendronate (FOSAMAX) 70 MG tablet Take 70 mg by  mouth once a week. Take with a full glass of water on an empty stomach.    Marland Kitchen FLUoxetine (PROZAC) 40 MG capsule Take 40 mg by mouth daily.     Marland Kitchen LINZESS 290 MCG CAPS capsule TAKE ONE CAPSULE BY MOUTH DAILY 30 MINUTES BEFORE BREAKFAST. 30 capsule 2  . Nutritional Supplements (HI-CAL PO) Take 4 tablets by mouth daily.    . pantoprazole (PROTONIX) 40 MG tablet Take 1  tablet (40 mg total) by mouth daily. 30 tablet 12  . risperiDONE (RISPERDAL) 2 MG tablet Take 2 mg by mouth at bedtime.    . traZODone (DESYREL) 150 MG tablet Take 300 mg by mouth at bedtime.     No current facility-administered medications for this visit.     Allergies as of 09/15/2016 - Review Complete 09/15/2016  Allergen Reaction Noted  . Ciprofloxacin Rash   . Kiwi extract Other (See Comments) 11/25/2015    Family History  Problem Relation Age of Onset  . Diabetes Father   . Colon cancer Maternal Uncle   . Liver disease Neg Hx     Social History   Social History  . Marital status: Divorced    Spouse name: N/A  . Number of children: 2  . Years of education: N/A   Occupational History  . disabled    Social History Main Topics  . Smoking status: Current Every Day Smoker    Packs/day: 1.00    Years: 30.00    Types: Cigarettes    Last attempt to quit: 11/13/2015  . Smokeless tobacco: Never Used     Comment: started back x 3 months/ a pack a day  . Alcohol use No  . Drug use:     Types: Marijuana     Comment: occasional  . Sexual activity: No   Other Topics Concern  . Not on file   Social History Narrative   Lives w/ mother   2 grown children    Review of Systems: General: Negative for anorexia, weight loss, fever, chills, fatigue, weakness. ENT: Negative for hoarseness, difficulty swallowing. CV: Negative for chest pain, angina, palpitations, peripheral edema.  Respiratory: Negative for dyspnea at rest, cough, sputum, wheezing.  GI: See history of present illness. Derm: Negative for rash or itching.  Neuro: Negative for memory loss, confusion.  Endo: Negative for unusual weight change.  Heme: Negative for bruising or bleeding. Allergy: Negative for rash or hives.   Physical Exam: BP 105/64   Pulse (!) 59   Temp 97.6 F (36.4 C) (Oral)   Ht 5\' 4"  (1.626 m)   Wt 166 lb (75.3 kg)   BMI 28.49 kg/m  General:   Alert and oriented. Pleasant and  cooperative. Well-nourished and well-developed.  Eyes:  Without icterus, sclera clear and conjunctiva pink.  Ears:  Normal auditory acuity. Cardiovascular:  S1, S2 present without murmurs appreciated. Normal pulses noted. Extremities without clubbing or edema. Respiratory:  Clear to auscultation bilaterally. No wheezes, rales, or rhonchi. No distress.  Gastrointestinal:  +BS, soft, non-tender and non-distended. No splenomegaly noted. No guarding or rebound. No masses appreciated. Liver border appreciated at the right costal margin upon deep inspiration and overall not felt to be enlarged. Rectal:  Deferred  Musculoskalatal:  Symmetrical without gross deformities. Skin:  Intact without significant lesions or rashes. No petechiae noted. Neurologic:  Alert and oriented x4;  grossly normal neurologically. Psych:  Alert and cooperative. Normal mood and affect. Heme/Lymph/Immune: No excessive bruising noted.    09/15/2016 8:38 AM  Disclaimer: This note was dictated with voice recognition software. Similar sounding words can inadvertently be transcribed and may not be corrected upon review.

## 2016-09-16 ENCOUNTER — Ambulatory Visit (HOSPITAL_COMMUNITY)
Admission: RE | Admit: 2016-09-16 | Discharge: 2016-09-16 | Disposition: A | Discharge status: Home / Self Care | Payer: Medicare Other | Source: Ambulatory Visit | Attending: General Surgery | Admitting: General Surgery

## 2016-09-16 DIAGNOSIS — K469 Unspecified abdominal hernia without obstruction or gangrene: Secondary | ICD-10-CM | POA: Diagnosis not present

## 2016-09-16 DIAGNOSIS — R1906 Epigastric swelling, mass or lump: Secondary | ICD-10-CM

## 2016-09-16 DIAGNOSIS — K439 Ventral hernia without obstruction or gangrene: Secondary | ICD-10-CM | POA: Diagnosis not present

## 2016-09-16 DIAGNOSIS — K7460 Unspecified cirrhosis of liver: Secondary | ICD-10-CM | POA: Insufficient documentation

## 2016-09-16 LAB — POCT I-STAT CREATININE: Creatinine, Ser: 1.1 mg/dL — ABNORMAL HIGH (ref 0.44–1.00)

## 2016-09-16 MED ORDER — IOPAMIDOL (ISOVUE-300) INJECTION 61%
100.0000 mL | Freq: Once | INTRAVENOUS | Status: AC | PRN
  Administered 2016-09-16: 100 mL via INTRAVENOUS

## 2016-09-21 NOTE — Progress Notes (Signed)
Please let the patient know that the mass is smaller.  Will get one additional CT in 6 months. Raquel Sarna, please put that on recall schedule.

## 2016-09-28 DIAGNOSIS — K7469 Other cirrhosis of liver: Secondary | ICD-10-CM | POA: Diagnosis not present

## 2016-09-28 DIAGNOSIS — K219 Gastro-esophageal reflux disease without esophagitis: Secondary | ICD-10-CM | POA: Diagnosis not present

## 2016-09-28 DIAGNOSIS — K5900 Constipation, unspecified: Secondary | ICD-10-CM | POA: Diagnosis not present

## 2016-09-28 LAB — CBC WITH DIFFERENTIAL/PLATELET
Basophils Absolute: 71 cells/uL (ref 0–200)
Basophils Relative: 1 %
Eosinophils Absolute: 284 cells/uL (ref 15–500)
Eosinophils Relative: 4 %
HCT: 43 % (ref 35.0–45.0)
Hemoglobin: 14.1 g/dL (ref 11.7–15.5)
Lymphocytes Relative: 19 %
Lymphs Abs: 1349 cells/uL (ref 850–3900)
MCH: 30.6 pg (ref 27.0–33.0)
MCHC: 32.8 g/dL (ref 32.0–36.0)
MCV: 93.3 fL (ref 80.0–100.0)
MPV: 10.3 fL (ref 7.5–12.5)
Monocytes Absolute: 639 cells/uL (ref 200–950)
Monocytes Relative: 9 %
Neutro Abs: 4757 cells/uL (ref 1500–7800)
Neutrophils Relative %: 67 %
Platelets: 329 10*3/uL (ref 140–400)
RBC: 4.61 MIL/uL (ref 3.80–5.10)
RDW: 14.5 % (ref 11.0–15.0)
WBC: 7.1 10*3/uL (ref 3.8–10.8)

## 2016-09-28 LAB — COMPREHENSIVE METABOLIC PANEL
ALT: 18 U/L (ref 6–29)
AST: 20 U/L (ref 10–35)
Albumin: 4 g/dL (ref 3.6–5.1)
Alkaline Phosphatase: 61 U/L (ref 33–130)
BUN: 24 mg/dL (ref 7–25)
CO2: 22 mmol/L (ref 20–31)
Calcium: 9.7 mg/dL (ref 8.6–10.4)
Chloride: 111 mmol/L — ABNORMAL HIGH (ref 98–110)
Creat: 1 mg/dL — ABNORMAL HIGH (ref 0.50–0.99)
Glucose, Bld: 86 mg/dL (ref 65–99)
Potassium: 4.3 mmol/L (ref 3.5–5.3)
Sodium: 142 mmol/L (ref 135–146)
Total Bilirubin: 0.6 mg/dL (ref 0.2–1.2)
Total Protein: 6.6 g/dL (ref 6.1–8.1)

## 2016-09-28 LAB — PROTIME-INR
INR: 1
Prothrombin Time: 10.7 s (ref 9.0–11.5)

## 2016-09-29 LAB — AFP TUMOR MARKER: AFP-Tumor Marker: 2.8 ng/mL (ref <6.1)

## 2016-11-03 ENCOUNTER — Other Ambulatory Visit (HOSPITAL_COMMUNITY): Payer: Self-pay | Admitting: Pulmonary Disease

## 2016-11-03 DIAGNOSIS — Z1231 Encounter for screening mammogram for malignant neoplasm of breast: Secondary | ICD-10-CM

## 2016-11-03 DIAGNOSIS — Z780 Asymptomatic menopausal state: Secondary | ICD-10-CM

## 2016-11-03 DIAGNOSIS — M8580 Other specified disorders of bone density and structure, unspecified site: Secondary | ICD-10-CM

## 2016-11-03 DIAGNOSIS — Z0000 Encounter for general adult medical examination without abnormal findings: Secondary | ICD-10-CM | POA: Diagnosis not present

## 2016-11-08 DIAGNOSIS — H5213 Myopia, bilateral: Secondary | ICD-10-CM | POA: Diagnosis not present

## 2016-11-08 DIAGNOSIS — H52203 Unspecified astigmatism, bilateral: Secondary | ICD-10-CM | POA: Diagnosis not present

## 2016-11-08 DIAGNOSIS — H524 Presbyopia: Secondary | ICD-10-CM | POA: Diagnosis not present

## 2016-11-08 DIAGNOSIS — H2513 Age-related nuclear cataract, bilateral: Secondary | ICD-10-CM | POA: Diagnosis not present

## 2016-11-17 ENCOUNTER — Ambulatory Visit (HOSPITAL_COMMUNITY)
Admission: RE | Admit: 2016-11-17 | Discharge: 2016-11-17 | Disposition: A | Discharge status: Home / Self Care | Payer: Medicare Other | Source: Ambulatory Visit | Attending: Pulmonary Disease | Admitting: Pulmonary Disease

## 2016-11-17 DIAGNOSIS — M85852 Other specified disorders of bone density and structure, left thigh: Secondary | ICD-10-CM | POA: Diagnosis not present

## 2016-11-17 DIAGNOSIS — Z1231 Encounter for screening mammogram for malignant neoplasm of breast: Secondary | ICD-10-CM | POA: Diagnosis not present

## 2016-11-17 DIAGNOSIS — Z780 Asymptomatic menopausal state: Secondary | ICD-10-CM | POA: Insufficient documentation

## 2016-11-17 DIAGNOSIS — M8580 Other specified disorders of bone density and structure, unspecified site: Secondary | ICD-10-CM | POA: Insufficient documentation

## 2016-11-17 DIAGNOSIS — Z0000 Encounter for general adult medical examination without abnormal findings: Secondary | ICD-10-CM | POA: Diagnosis not present

## 2016-11-17 DIAGNOSIS — K210 Gastro-esophageal reflux disease with esophagitis: Secondary | ICD-10-CM | POA: Diagnosis not present

## 2016-11-21 ENCOUNTER — Ambulatory Visit (INDEPENDENT_AMBULATORY_CARE_PROVIDER_SITE_OTHER): Payer: Medicare Other | Admitting: Otolaryngology

## 2016-11-21 DIAGNOSIS — K219 Gastro-esophageal reflux disease without esophagitis: Secondary | ICD-10-CM | POA: Diagnosis not present

## 2016-11-21 DIAGNOSIS — R490 Dysphonia: Secondary | ICD-10-CM | POA: Diagnosis not present

## 2016-11-21 DIAGNOSIS — F17210 Nicotine dependence, cigarettes, uncomplicated: Secondary | ICD-10-CM | POA: Diagnosis not present

## 2016-12-05 ENCOUNTER — Emergency Department (HOSPITAL_COMMUNITY)
Admission: EM | Admit: 2016-12-05 | Discharge: 2016-12-05 | Disposition: A | Discharge status: Home / Self Care | Payer: Medicare Other | Attending: Emergency Medicine | Admitting: Emergency Medicine

## 2016-12-05 ENCOUNTER — Encounter (HOSPITAL_COMMUNITY): Payer: Self-pay | Admitting: Emergency Medicine

## 2016-12-05 DIAGNOSIS — F17210 Nicotine dependence, cigarettes, uncomplicated: Secondary | ICD-10-CM | POA: Insufficient documentation

## 2016-12-05 DIAGNOSIS — R197 Diarrhea, unspecified: Secondary | ICD-10-CM | POA: Diagnosis not present

## 2016-12-05 DIAGNOSIS — R112 Nausea with vomiting, unspecified: Secondary | ICD-10-CM | POA: Diagnosis not present

## 2016-12-05 HISTORY — DX: Gastro-esophageal reflux disease without esophagitis: K21.9

## 2016-12-05 LAB — URINALYSIS, ROUTINE W REFLEX MICROSCOPIC
Bilirubin Urine: NEGATIVE
Glucose, UA: NEGATIVE mg/dL
Hgb urine dipstick: NEGATIVE
Ketones, ur: NEGATIVE mg/dL
Nitrite: NEGATIVE
Protein, ur: NEGATIVE mg/dL
Specific Gravity, Urine: 1.014 (ref 1.005–1.030)
pH: 7 (ref 5.0–8.0)

## 2016-12-05 LAB — COMPREHENSIVE METABOLIC PANEL
ALT: 23 U/L (ref 14–54)
AST: 26 U/L (ref 15–41)
Albumin: 4.1 g/dL (ref 3.5–5.0)
Alkaline Phosphatase: 58 U/L (ref 38–126)
Anion gap: 10 (ref 5–15)
BUN: 12 mg/dL (ref 6–20)
CO2: 23 mmol/L (ref 22–32)
Calcium: 9 mg/dL (ref 8.9–10.3)
Chloride: 102 mmol/L (ref 101–111)
Creatinine, Ser: 0.91 mg/dL (ref 0.44–1.00)
GFR calc Af Amer: >60 mL/min (ref >60)
GFR calc non Af Amer: >60 mL/min (ref >60)
Glucose, Bld: 128 mg/dL — ABNORMAL HIGH (ref 65–99)
Potassium: 3.6 mmol/L (ref 3.5–5.1)
Sodium: 135 mmol/L (ref 135–145)
Total Bilirubin: 0.8 mg/dL (ref 0.3–1.2)
Total Protein: 7.2 g/dL (ref 6.5–8.1)

## 2016-12-05 LAB — CBC
HCT: 42.6 % (ref 36.0–46.0)
Hemoglobin: 14.6 g/dL (ref 12.0–15.0)
MCH: 31.4 pg (ref 26.0–34.0)
MCHC: 34.3 g/dL (ref 30.0–36.0)
MCV: 91.6 fL (ref 78.0–100.0)
Platelets: 285 10*3/uL (ref 150–400)
RBC: 4.65 MIL/uL (ref 3.87–5.11)
RDW: 12.8 % (ref 11.5–15.5)
WBC: 10.4 10*3/uL (ref 4.0–10.5)

## 2016-12-05 LAB — LIPASE, BLOOD: Lipase: 25 U/L (ref 11–51)

## 2016-12-05 MED ORDER — DIPHENOXYLATE-ATROPINE 2.5-0.025 MG PO TABS: 1.0000 | ORAL_TABLET | Freq: Four times a day (QID) | ORAL | 0 refills | Status: DC | PRN

## 2016-12-05 MED ORDER — ONDANSETRON HCL 4 MG/2ML IJ SOLN
4.0000 mg | Freq: Once | INTRAMUSCULAR | Status: AC
  Administered 2016-12-05: 4 mg via INTRAVENOUS
  Filled 2016-12-05: qty 2

## 2016-12-05 MED ORDER — SODIUM CHLORIDE 0.9 % IV BOLUS (SEPSIS)
1000.0000 mL | Freq: Once | INTRAVENOUS | Status: AC
  Administered 2016-12-05: 1000 mL via INTRAVENOUS

## 2016-12-05 MED ORDER — PROMETHAZINE HCL 25 MG PO TABS: 25.0000 mg | ORAL_TABLET | Freq: Four times a day (QID) | ORAL | 0 refills | Status: DC | PRN

## 2016-12-05 MED ORDER — SODIUM CHLORIDE 0.9 % IV SOLN
Freq: Once | INTRAVENOUS | Status: AC
  Administered 2016-12-05: 09:00:00 via INTRAVENOUS

## 2016-12-05 MED ORDER — PANTOPRAZOLE SODIUM 40 MG IV SOLR
40.0000 mg | Freq: Once | INTRAVENOUS | Status: AC
  Administered 2016-12-05: 40 mg via INTRAVENOUS
  Filled 2016-12-05: qty 40

## 2016-12-05 NOTE — ED Provider Notes (Signed)
[facility] DEPT Provider Note   CSN: [id number] Arrival date & time: 12/05/16  7829  By signing my name below, I, Ethelle Lyon Long, attest that this documentation has been prepared under the direction and in the presence of Nat Christen, MD. Electronically Signed: Ethelle Lyon Long, Scribe. 12/05/2016. 9:28 AM.  History   Chief Complaint Chief Complaint  Patient presents with  . Emesis   The history is provided by the patient and medical records. No language interpreter was used.    HPI Comments:  [name] is a 61 y.o. female with a PMHx of Colonic Inertia, Hyperplastic Colon Polyp, Diverticulosis, Schizoaffective Disorder, Depression, and PUD, who presents to the Emergency Department complaining of persistent emesis onset three days ago. She reports beginning to feel bad four days ago with a stated "9 pounds lost in one day" three days ago from persistent loose stool and multiple episodes of emesis. She states continued reduced PO intake since that time, not being able to eat anything with her last episode of emesis occurring yesterday. Pt notes this is abnormal for her. Pt has associated symptoms of generalized weakness and nausea. No alleviating factors reported. No h/o Asthma, potential h/o COPD. She has a PSHx of Cholecystectomy and Abdominal Hysterectomy. Pt denies abdominal pain, diarrhea, and any other complaints at this time. Pt is a current every day smoker.    Past Medical History:  Diagnosis Date  . Chronic constipation   . Cirrhosis ([place])    child pugh class A and MELD 5, completed hep A and B vaccine series, no HCC on 3/16 MRI  . Colonic inertia   . Depression   . Diverticula of colon   . Diverticulosis 01/31/2008   colonoscopy Dr Gala Romney  . GERD (gastroesophageal reflux disease)   . Hyperplastic colon polyp   . Liver mass   . Obesity   . PUD (peptic ulcer disease) 1988  . Schizoaffective disorder    Mental Health-Dr Elias Else  . Wrist fracture    Left wrist s/p  fall and surgical repair    Patient Active Problem List   Diagnosis Date Noted  . GERD (gastroesophageal reflux disease) 09/15/2016  . Mucosal abnormality of stomach   . Nausea with vomiting 11/04/2015  . Abdominal pain, epigastric 09/17/2015  . Peritonitis ([place]) 08/27/2015  . Pain following surgery or procedure 08/22/2015  . Abdominal pain 08/01/2015  . Nausea 08/01/2015  . Abdominal mass 08/01/2015  . Schizoaffective disorder ([place]) 05/15/2014  . Hepatic cirrhosis ([place]) 10/18/2013  . LLQ pain 08/13/2013  . DEPRESSION 04/08/2008  . PUD 04/08/2008  . DIVERTICULOSIS, COLON 04/08/2008  . Constipation 04/08/2008  . MORBID OBESITY, HX OF 04/08/2008    Past Surgical History:  Procedure Laterality Date  . ABDOMINAL HYSTERECTOMY    . CHOLECYSTECTOMY  08/2010   cholelithiasis; Dr Arnoldo Morale  . COLONOSCOPY  01/31/2008   Dr. Gala Romney- normal rectum, L sided diverticula, long tortuous colon. hyperplasitic  polyp.  Marland Kitchen ERCP W/ SPHINCTEROTOMY AND BALLOON DILATION  08/05/2010   normal ampulla/mild erythema in the gastric remnant without ulcerations or erosions. normal retroflexed view of the cardia/anastomosis normal/distal common bile duct stones s/p extraction and shincterotomy  . ESOPHAGOGASTRODUODENOSCOPY N/A 10/24/2013   [id number] esophagus. Surgically altered stomach. Abnormal gastric mucosa  -  status post biopsy (reactive gastrophathy)  . ESOPHAGOGASTRODUODENOSCOPY (EGD) WITH PROPOFOL N/A 11/30/2015   Procedure: ESOPHAGOGASTRODUODENOSCOPY (EGD) WITH PROPOFOL;  Surgeon: Daneil Dolin, MD;  Location: [facility];  Service: Endoscopy;  Laterality: N/A;  730   .  EUS N/A 08/20/2015   Dr. Ardis Hughs: well-circumscribed 5.2 cm chronic mass in porta hepatis, internally mass is gelatinous. Small distal esophageal varices (limited view), Bilroth 1 type anatomy, cytology NEGATIVE for malignant cells. Suspicion for neoplasm low   . FOOT SURGERY     left  . GASTRIC BYPASS     with Revision, Dr.  Duffy Rhody  . S/P Hysterectomy     partial  . TONSILLECTOMY    . WRIST SURGERY Left 05/2016    OB History    No data available      Home Medications    Prior to Admission medications   Medication Sig Start Date End Date Taking? Authorizing Provider  alendronate (FOSAMAX) 70 MG tablet Take 70 mg by mouth once a week. Take with a full glass of water on an empty stomach.   Yes Historical Provider, MD  FLUoxetine (PROZAC) 40 MG capsule Take 40 mg by mouth daily.  06/06/12  Yes Historical Provider, MD  LINZESS 290 MCG CAPS capsule TAKE ONE CAPSULE BY MOUTH DAILY 30 MINUTES BEFORE BREAKFAST. 07/27/16  Yes Carlis Stable, NP  Nutritional Supplements (HI-CAL PO) Take 4 tablets by mouth daily.   Yes Historical Provider, MD  pantoprazole (PROTONIX) 40 MG tablet Take 1 tablet (40 mg total) by mouth daily. 08/04/15  Yes Sinda Du, MD  ranitidine (ZANTAC) 150 MG capsule Take 150 mg by mouth every evening.   Yes Historical Provider, MD  risperiDONE (RISPERDAL) 2 MG tablet Take 2 mg by mouth at bedtime.   Yes Historical Provider, MD  traZODone (DESYREL) 150 MG tablet Take 300 mg by mouth at bedtime. 07/20/15  Yes Historical Provider, MD  diphenoxylate-atropine (LOMOTIL) 2.5-0.025 MG tablet Take 1 tablet by mouth 4 (four) times daily as needed for diarrhea or loose stools. 12/05/16   Nat Christen, MD  promethazine (PHENERGAN) 25 MG tablet Take 1 tablet (25 mg total) by mouth every 6 (six) hours as needed. 12/05/16   Nat Christen, MD    Family History Family History  Problem Relation Age of Onset  . Diabetes Father   . Colon cancer Maternal Uncle   . Liver disease Neg Hx     Social History Social History  Substance Use Topics  . Smoking status: Current Every Day Smoker    Packs/day: 1.00    Years: 30.00    Types: Cigarettes    Last attempt to quit: 11/13/2015  . Smokeless tobacco: Never Used     Comment: started back x 3 months/ a pack a day  . Alcohol use No    Allergies   Ciprofloxacin and  Kiwi extract   Review of Systems Review of Systems 10 systems reviewed and all are negative for acute change except as noted in the HPI.   Physical Exam Updated Vital Signs BP (!) 152/74   Pulse (!) 50   Temp 97.8 F (36.6 C) (Oral)   Resp 16   Ht 5' 4.5" (1.638 m)   Wt 158 lb (71.7 kg)   SpO2 97%   BMI 26.70 kg/m   Physical Exam  Constitutional: She is oriented to person, place, and time. She appears well-developed and well-nourished.  Slightly dehydrated but alert.  HENT:  Head: Normocephalic and atraumatic.  Eyes: Conjunctivae are normal.  Neck: Neck supple.  Cardiovascular: Normal rate and regular rhythm.   Pulmonary/Chest: Effort normal and breath sounds normal.  Abdominal: Soft. Bowel sounds are normal.  Musculoskeletal: Normal range of motion.  Neurological: She is alert and oriented to  person, place, and time.  Skin: Skin is warm and dry.  Psychiatric: She has a normal mood and affect. Her behavior is normal.  Nursing note and vitals reviewed.    ED Treatments / Results  DIAGNOSTIC STUDIES:  Oxygen Saturation is 98% on RA, normal by my interpretation.    COORDINATION OF CARE:  9:28 AM Discussed treatment plan with pt at bedside including IV fluids, blood work, and anti-emetics and pt agreed to plan.  Labs (all labs ordered are listed, but only abnormal results are displayed) Labs Reviewed  COMPREHENSIVE METABOLIC PANEL - Abnormal; Notable for the following:       Result Value   Glucose, Bld 128 (*)    All other components within normal limits  URINALYSIS, ROUTINE W REFLEX MICROSCOPIC - Abnormal; Notable for the following:    APPearance HAZY (*)    Leukocytes, UA TRACE (*)    Bacteria, UA RARE (*)    Squamous Epithelial / LPF 6-30 (*)    All other components within normal limits  LIPASE, BLOOD  CBC    EKG  EKG Interpretation None       Radiology No results found.  Procedures Procedures (including critical care time)  Medications  Ordered in ED Medications  0.9 %  sodium chloride infusion ( Intravenous Stopped 12/05/16 1023)  sodium chloride 0.9 % bolus 1,000 mL (0 mLs Intravenous Stopped 12/05/16 1107)  ondansetron (ZOFRAN) injection 4 mg (4 mg Intravenous Given 12/05/16 0944)  sodium chloride 0.9 % bolus 1,000 mL (1,000 mLs Intravenous New Bag/Given 12/05/16 1107)  ondansetron (ZOFRAN) injection 4 mg (4 mg Intravenous Given 12/05/16 1106)  pantoprazole (PROTONIX) injection 40 mg (40 mg Intravenous Given 12/05/16 1106)     Initial Impression / Assessment and Plan / ED Course  I have reviewed the triage vital signs and the nursing notes.  Pertinent labs & imaging results that were available during my care of the patient were reviewed by me and considered in my medical decision making (see chart for details).     Patient is hemodynamically stable. She feels better after IV fluids, IV proton X, IV Zofran. Discharge medications Phenergan 25 mg, Lomotil.  Final Clinical Impressions(s) / ED Diagnoses   Final diagnoses:  Nausea vomiting and diarrhea    New Prescriptions New Prescriptions   DIPHENOXYLATE-ATROPINE (LOMOTIL) 2.5-0.025 MG TABLET    Take 1 tablet by mouth 4 (four) times daily as needed for diarrhea or loose stools.   PROMETHAZINE (PHENERGAN) 25 MG TABLET    Take 1 tablet (25 mg total) by mouth every 6 (six) hours as needed.  I personally performed the services described in this documentation, which was scribed in my presence. The recorded information has been reviewed and is accurate.      Nat Christen, MD 12/05/16 1250

## 2016-12-05 NOTE — Discharge Instructions (Signed)
Increase fluids. Medication for nausea and diarrhea. Rest. Return if worse.

## 2016-12-05 NOTE — ED Triage Notes (Signed)
c/o vomiting x 3 days, mm dry, a/o, no weakness noted

## 2016-12-05 NOTE — ED Notes (Signed)
RN notified that fluids are empty at this time.

## 2016-12-31 IMAGING — CT CT ABD-PELV W/ CM
2 of 5 series · 16 of 46 positions shown, 18 images · IV contrast (Omnipaque 300)
Comparison: 09/09/2015

CLINICAL DATA: Upper abdominal pain, history of chronic
constipation diverticulosis peptic ulcer colonic inertia liver mass
cirrhosis gastric bypass cholecystectomy hysterectomy

EXAM:
CT ABDOMEN AND PELVIS WITH CONTRAST
TECHNIQUE: Multidetector CT imaging of the abdomen and pelvis was performed
using the standard protocol following bolus administration of
intravenous contrast.
CONTRAST:  25mL OMNIPAQUE IOHEXOL 300 MG/ML SOLN, 100mL OMNIPAQUE
IOHEXOL 300 MG/ML SOLN

[Series 2: abd_pel_with 5.0 b40f · axial · 0.95mm/px · z∈[+478,+908]mm · 13 of 98 slices shown, 15 images]
[im 6/98  soft-tissue]
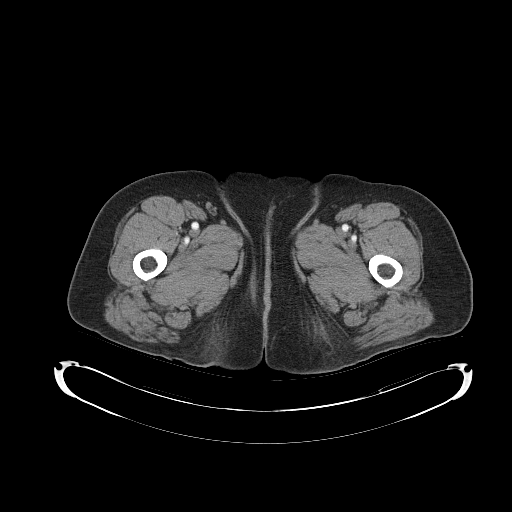
[im 6/98  bone]
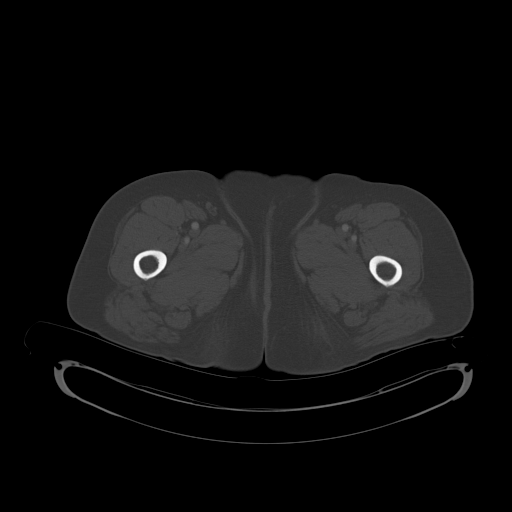
[im 12/98  soft-tissue]
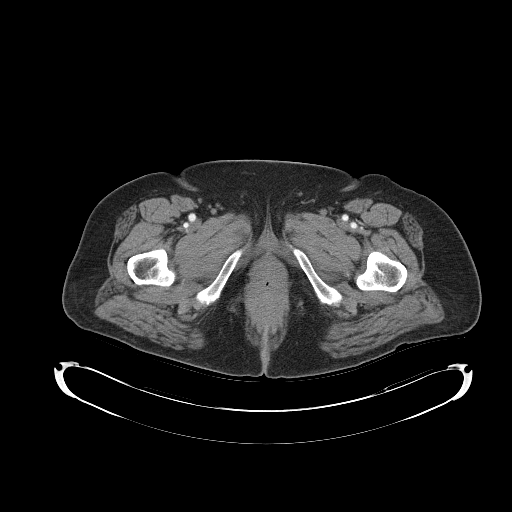
[im 23/98  soft-tissue]
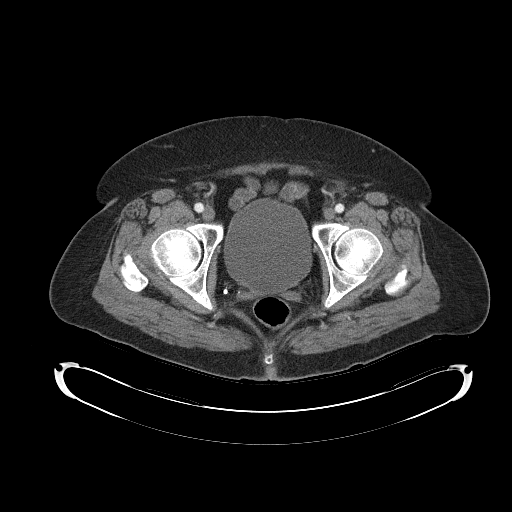
[im 29/98  soft-tissue]
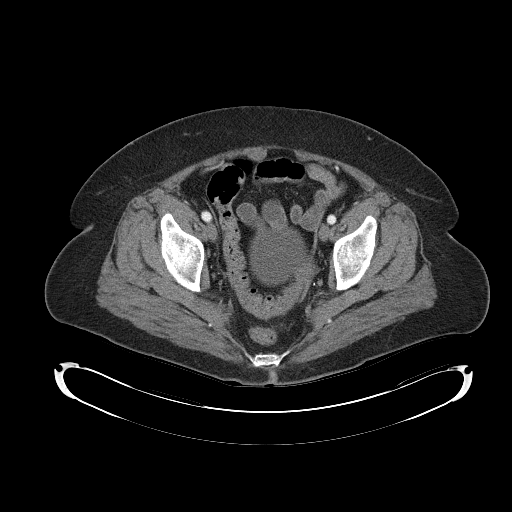
[im 35/98  soft-tissue]
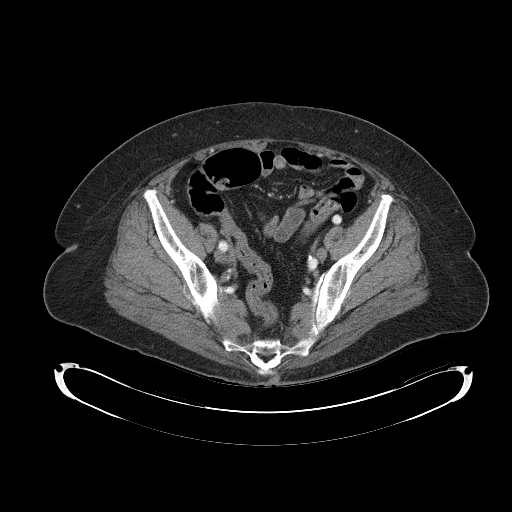
[im 40/98  soft-tissue]
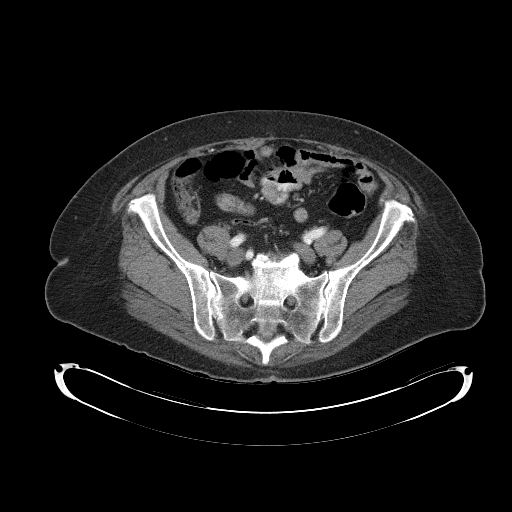
[im 52/98  soft-tissue]
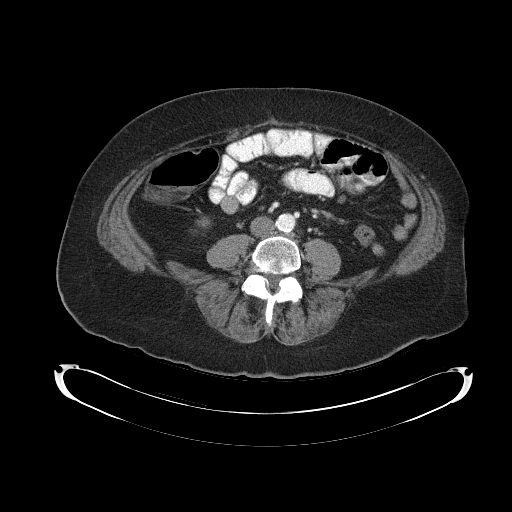
[im 58/98  soft-tissue]
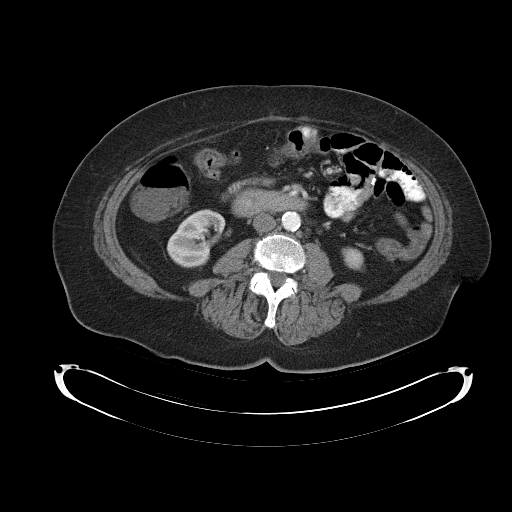
[im 63/98  soft-tissue]
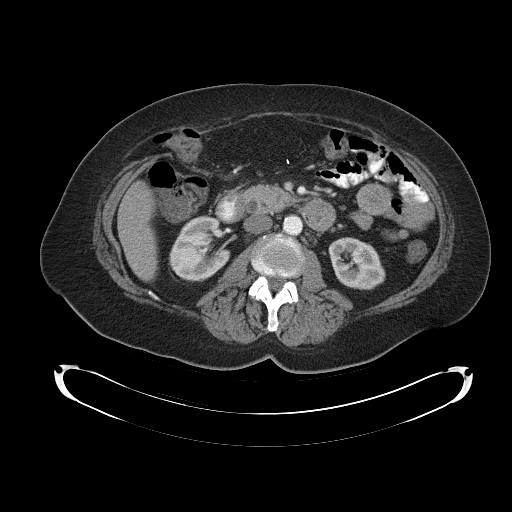
[im 63/98  bone]
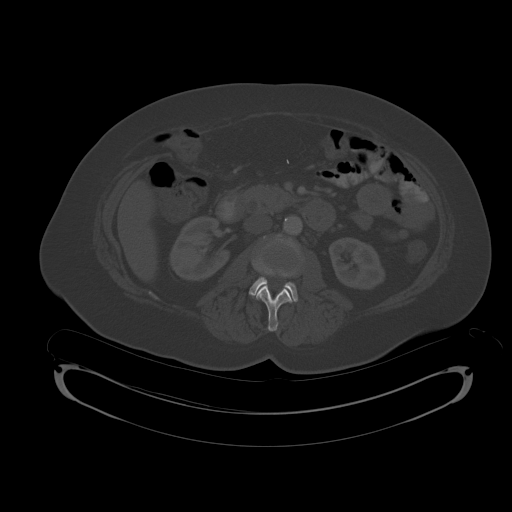
[im 69/98  soft-tissue]
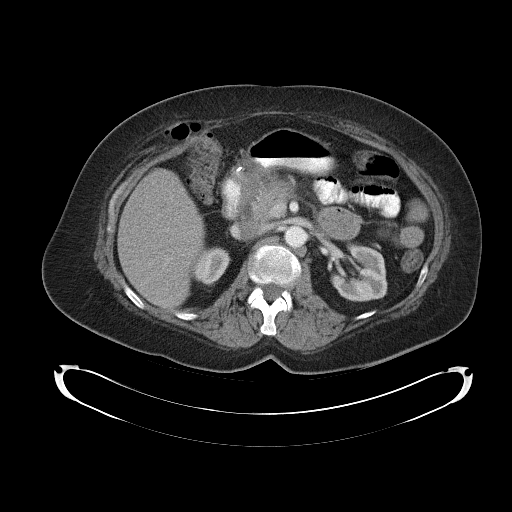
[im 75/98  soft-tissue]
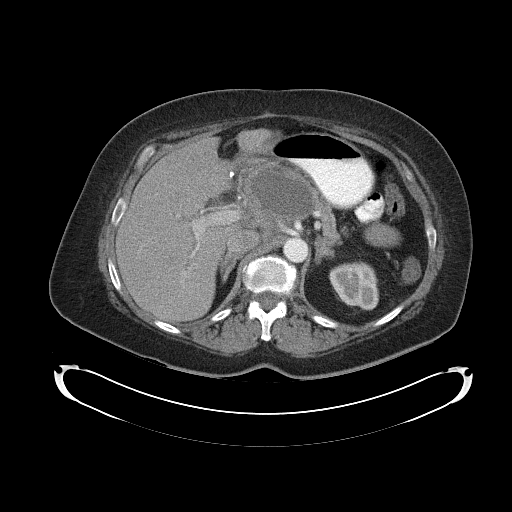
[im 86/98  soft-tissue]
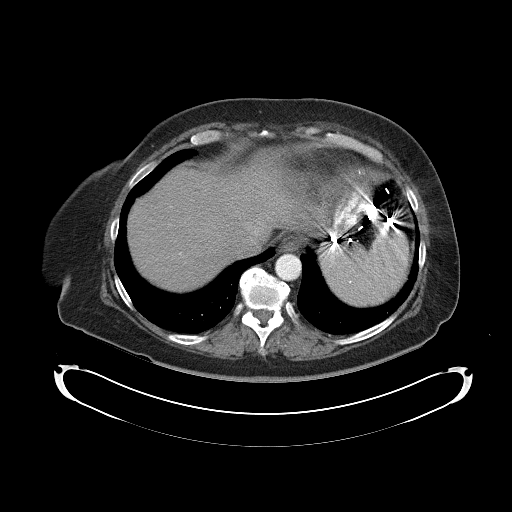
[im 92/98  soft-tissue]
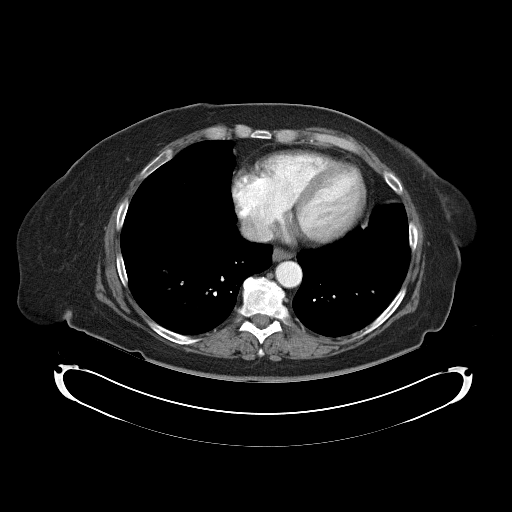

[Series 3: abd_pel_with 3.0 spo cor · coronal · 0.80mm/px · 3 of 93 slices shown]
[im 31/93  soft-tissue]
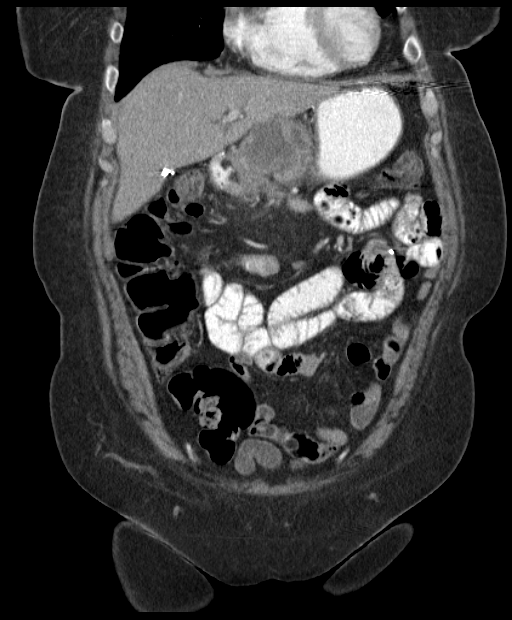
[im 41/93  soft-tissue]
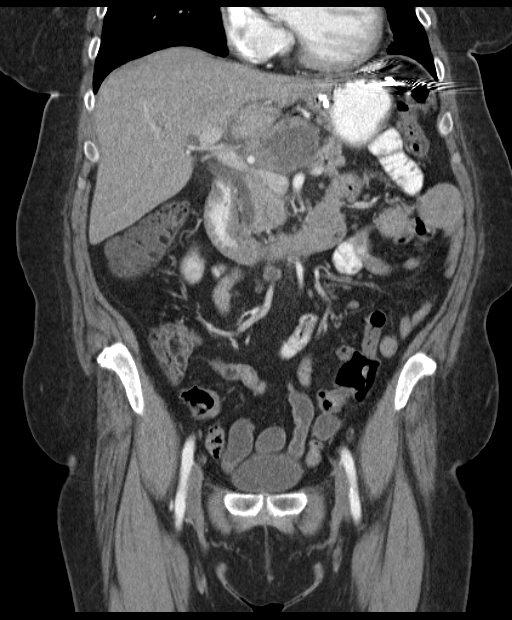
[im 52/93  soft-tissue]
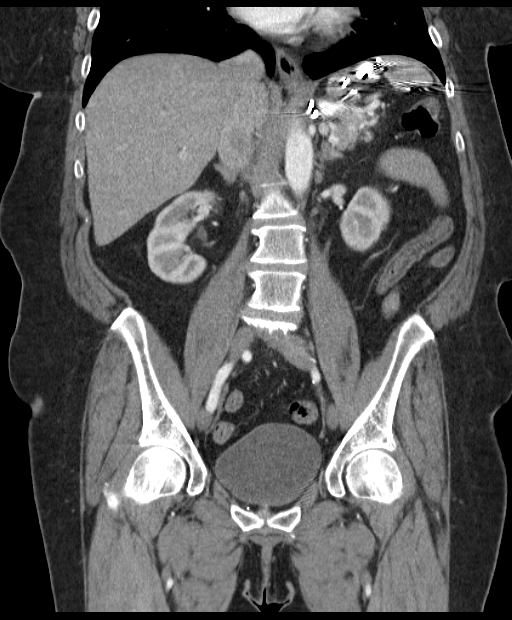

[16 of 46 positions shown; findings below may reference images not displayed]

FINDINGS: Lower chest:  Clear

Hepatobiliary: Mild steatosis. Status post cholecystectomy. 3.5 cm
subtle lesion lateral left lobe of the liver with questionable
subtle peripheral enhancement and subtle low-attenuation center not
seen clearly on 08/25/2015 12/18/2015. The lesion is also not seen
on 11/20/2014 MRI.

Pancreas: Normal

Spleen: Normal

Adrenals/Urinary Tract: Normal

Stomach/Bowel: Status post gastric bypass. Fluid-filled lesion
between the porta hepatis at lesser curvature of the stomach causing
inferior mass effect on the pancreas, similar to prior study
measuring 6 cm currently as opposed to 5.6 cm previously.
Differential diagnostic possibilities again include postoperative
seroma or pancreatic pseudocyst. Small bowel and appendix normal.
Large bowel is involved with hernia as described below.

Vascular/Lymphatic: Mild atherosclerotic calcification of the
aortoiliac vessels.

Reproductive: Uterus absent.  No pelvic masses.

Other: Hepatic flexure of the colon is involved within anterior
right abdominal wall hernia. No evidence of strangulation or
obstruction.

Musculoskeletal: No acute findings
IMPRESSION: Stable right ventral hernia

Status post gastric bypass. Fluid-filled structure between the porta
hepatis and lesser curvature stomach minimally larger when compared
to prior study.

Possible subtle left hepatic mass not seen on prior studies.
Consider evaluation with liver MRI.

## 2017-01-02 ENCOUNTER — Ambulatory Visit (INDEPENDENT_AMBULATORY_CARE_PROVIDER_SITE_OTHER): Payer: Medicare Other | Admitting: Otolaryngology

## 2017-01-02 DIAGNOSIS — K219 Gastro-esophageal reflux disease without esophagitis: Secondary | ICD-10-CM | POA: Diagnosis not present

## 2017-01-02 DIAGNOSIS — R490 Dysphonia: Secondary | ICD-10-CM | POA: Diagnosis not present

## 2017-01-09 ENCOUNTER — Telehealth: Payer: Self-pay | Admitting: Internal Medicine

## 2017-01-09 NOTE — Telephone Encounter (Signed)
June RECALL FOR ULTRASOUND

## 2017-01-09 NOTE — Telephone Encounter (Signed)
Letter mailed

## 2017-01-16 ENCOUNTER — Telehealth: Payer: Self-pay

## 2017-01-16 ENCOUNTER — Other Ambulatory Visit: Payer: Self-pay

## 2017-01-16 DIAGNOSIS — K7460 Unspecified cirrhosis of liver: Secondary | ICD-10-CM

## 2017-01-16 NOTE — Telephone Encounter (Signed)
Pt called office, she received recall letter for Ultrasound. Ultrasound abdomen limited RUQ scheduled for 02/14/17 at 9:30am, pt to arrive at 9:15am. NPO after midnight. Called and informed pt. Letter also mailed.

## 2017-02-10 ENCOUNTER — Other Ambulatory Visit: Payer: Self-pay | Admitting: Nurse Practitioner

## 2017-02-14 ENCOUNTER — Ambulatory Visit (HOSPITAL_COMMUNITY)
Admission: RE | Admit: 2017-02-14 | Discharge: 2017-02-14 | Disposition: A | Discharge status: Home / Self Care | Payer: Medicare Other | Source: Ambulatory Visit | Attending: Internal Medicine | Admitting: Internal Medicine

## 2017-02-14 DIAGNOSIS — R932 Abnormal findings on diagnostic imaging of liver and biliary tract: Secondary | ICD-10-CM | POA: Insufficient documentation

## 2017-02-14 DIAGNOSIS — Z9049 Acquired absence of other specified parts of digestive tract: Secondary | ICD-10-CM | POA: Insufficient documentation

## 2017-02-14 DIAGNOSIS — K7460 Unspecified cirrhosis of liver: Secondary | ICD-10-CM | POA: Diagnosis not present

## 2017-02-16 ENCOUNTER — Telehealth: Payer: Self-pay | Admitting: Internal Medicine

## 2017-02-16 NOTE — Telephone Encounter (Signed)
What type of CT does she need, with or without contrast?

## 2017-02-16 NOTE — Telephone Encounter (Signed)
Pt is scheduled OV in July, but needs a CT in July as well.

## 2017-02-17 NOTE — Telephone Encounter (Signed)
CT abd w/contrast is the type patient had in January.

## 2017-02-20 ENCOUNTER — Other Ambulatory Visit: Payer: Self-pay

## 2017-02-20 DIAGNOSIS — R1900 Intra-abdominal and pelvic swelling, mass and lump, unspecified site: Secondary | ICD-10-CM

## 2017-02-20 NOTE — Telephone Encounter (Signed)
Attempted to submit PA info for CT abd w/contrast via [facility] website. No PA needed.

## 2017-02-20 NOTE — Telephone Encounter (Signed)
From record, we are not ordering CT? correct

## 2017-02-20 NOTE — Telephone Encounter (Signed)
I ordered CT abd with contrast, is this what she needs or do I need to change the order?

## 2017-02-20 NOTE — Telephone Encounter (Signed)
Per your ultrasound result note 02/14/17 she needed a CT in July.

## 2017-02-20 NOTE — Telephone Encounter (Signed)
Noted  

## 2017-02-20 NOTE — Telephone Encounter (Signed)
CT abd with contrast scheduled for 03/07/17 at 11:00am, pt to arrive at 10:45am. NPO for 4 hours before test. Pt to pick-up contrast before day of test. Tried to call pt, no answer, no answering machine. Letter mailed to pt.

## 2017-02-20 NOTE — Telephone Encounter (Signed)
  Thanks. Proceed with CT.

## 2017-03-02 ENCOUNTER — Ambulatory Visit (INDEPENDENT_AMBULATORY_CARE_PROVIDER_SITE_OTHER): Payer: Medicare Other | Admitting: Otolaryngology

## 2017-03-02 DIAGNOSIS — K219 Gastro-esophageal reflux disease without esophagitis: Secondary | ICD-10-CM | POA: Diagnosis not present

## 2017-03-02 DIAGNOSIS — R490 Dysphonia: Secondary | ICD-10-CM

## 2017-03-02 DIAGNOSIS — F17210 Nicotine dependence, cigarettes, uncomplicated: Secondary | ICD-10-CM

## 2017-03-07 ENCOUNTER — Ambulatory Visit (HOSPITAL_COMMUNITY)
Admission: RE | Admit: 2017-03-07 | Discharge: 2017-03-07 | Disposition: A | Discharge status: Home / Self Care | Payer: Medicare Other | Source: Ambulatory Visit | Attending: Internal Medicine | Admitting: Internal Medicine

## 2017-03-07 DIAGNOSIS — K439 Ventral hernia without obstruction or gangrene: Secondary | ICD-10-CM | POA: Diagnosis not present

## 2017-03-07 DIAGNOSIS — R1900 Intra-abdominal and pelvic swelling, mass and lump, unspecified site: Secondary | ICD-10-CM | POA: Insufficient documentation

## 2017-03-07 DIAGNOSIS — K769 Liver disease, unspecified: Secondary | ICD-10-CM | POA: Diagnosis not present

## 2017-03-07 DIAGNOSIS — I700 Atherosclerosis of aorta: Secondary | ICD-10-CM | POA: Diagnosis not present

## 2017-03-07 DIAGNOSIS — R1909 Other intra-abdominal and pelvic swelling, mass and lump: Secondary | ICD-10-CM | POA: Diagnosis not present

## 2017-03-07 LAB — POCT I-STAT CREATININE: Creatinine, Ser: 1.1 mg/dL — ABNORMAL HIGH (ref 0.44–1.00)

## 2017-03-07 MED ORDER — IOPAMIDOL (ISOVUE-300) INJECTION 61%
100.0000 mL | Freq: Once | INTRAVENOUS | Status: AC | PRN
  Administered 2017-03-07: 100 mL via INTRAVENOUS

## 2017-03-15 ENCOUNTER — Ambulatory Visit (INDEPENDENT_AMBULATORY_CARE_PROVIDER_SITE_OTHER): Payer: Medicare Other | Admitting: Gastroenterology

## 2017-03-15 ENCOUNTER — Encounter: Payer: Self-pay | Admitting: Gastroenterology

## 2017-03-15 VITALS — BP 107/69 | HR 60 | Temp 98.3°F | Ht 64.0 in | Wt 158.2 lb

## 2017-03-15 DIAGNOSIS — K2100 Gastro-esophageal reflux disease with esophagitis, without bleeding: Secondary | ICD-10-CM

## 2017-03-15 DIAGNOSIS — K7460 Unspecified cirrhosis of liver: Secondary | ICD-10-CM

## 2017-03-15 DIAGNOSIS — K5900 Constipation, unspecified: Secondary | ICD-10-CM

## 2017-03-15 NOTE — Patient Instructions (Signed)
Keep up the good work!  We will see you in 6 months.  We will order an ultrasound prior to that appointment.  You will need an upper endoscopy and colonoscopy next year, which we will schedule at your appointment.

## 2017-03-15 NOTE — Progress Notes (Signed)
Referring Provider: Sinda Du, MD Primary Care Physician:  Sinda Du, MD Primary GI: Dr. Gala Romney   Chief Complaint  Patient presents with  . Cirrhosis    HPI:   [name] is a 61 y.o. female presenting today with a history of cirrhosis, due for routine follow-up. Next EGD due in March 2019. History of peripancreatic mass posterior to distal stomach that has been followed by Dr. [place] Dienes. Most consistent with lymph node. Recent CT on file from July 2018 with need for ultrasound in 6 months. Most recent LFTs in April 2018 normal.   Constipation managed well with Linzess 290 mcg daily. Protonix for GERD, taking ranitidine in evenings per ENT recommendations. No abdominal pain, N/V, overt bleeding, mental status changes, confusion, jaundice. She has purposefully lost weight with diet and behavior modification.     Past Medical History:  Diagnosis Date  . Chronic constipation   . Cirrhosis ([place])    child pugh class A and MELD 5, completed hep A and B vaccine series, no HCC on 3/16 MRI  . Colonic inertia   . Depression   . Diverticula of colon   . Diverticulosis 01/31/2008   colonoscopy Dr Gala Romney  . GERD (gastroesophageal reflux disease)   . Hyperplastic colon polyp   . Liver mass   . Obesity   . PUD (peptic ulcer disease) 1988  . Schizoaffective disorder    Mental Health-Dr Elias Else  . Wrist fracture    Left wrist s/p fall and surgical repair    Past Surgical History:  Procedure Laterality Date  . ABDOMINAL HYSTERECTOMY    . CHOLECYSTECTOMY  08/2010   cholelithiasis; Dr Arnoldo Morale  . COLONOSCOPY  01/31/2008   Dr. Gala Romney- normal rectum, L sided diverticula, long tortuous colon. hyperplasitic  polyp.  Marland Kitchen ERCP W/ SPHINCTEROTOMY AND BALLOON DILATION  08/05/2010   normal ampulla/mild erythema in the gastric remnant without ulcerations or erosions. normal retroflexed view of the cardia/anastomosis normal/distal common bile duct stones s/p extraction and shincterotomy  .  ESOPHAGOGASTRODUODENOSCOPY N/A 10/24/2013   [id number] esophagus. Surgically altered stomach. Abnormal gastric mucosa  -  status post biopsy (reactive gastrophathy)  . ESOPHAGOGASTRODUODENOSCOPY (EGD) WITH PROPOFOL N/A 11/30/2015   LA grade A esophagitis, no varices, surveillance in 2019  . EUS N/A 08/20/2015   Dr. Ardis Hughs: well-circumscribed 5.2 cm chronic mass in porta hepatis, internally mass is gelatinous. Small distal esophageal varices (limited view), Bilroth 1 type anatomy, cytology NEGATIVE for malignant cells. Suspicion for neoplasm low   . FOOT SURGERY     left  . GASTRIC BYPASS     with Revision, Dr. Duffy Rhody  . S/P Hysterectomy     partial  . TONSILLECTOMY    . WRIST SURGERY Left 05/2016    Current Outpatient Prescriptions  Medication Sig Dispense Refill  . alendronate (FOSAMAX) 70 MG tablet Take 70 mg by mouth once a week. Take with a full glass of water on an empty stomach.    Marland Kitchen FLUoxetine (PROZAC) 40 MG capsule Take 40 mg by mouth daily.     Marland Kitchen LINZESS 290 MCG CAPS capsule TAKE ONE CAPSULE BY MOUTH DAILY 30 MINUTES BEFORE BREAKFAST. 90 capsule 3  . Nutritional Supplements (HI-CAL PO) Take 4 tablets by mouth daily.    . pantoprazole (PROTONIX) 40 MG tablet Take 1 tablet (40 mg total) by mouth daily. 30 tablet 12  . promethazine (PHENERGAN) 25 MG tablet Take 1 tablet (25 mg total) by mouth every 6 (six) hours as needed. 15 tablet  0  . ranitidine (ZANTAC) 150 MG capsule Take 150 mg by mouth every evening.    . risperiDONE (RISPERDAL) 2 MG tablet Take 2 mg by mouth at bedtime.    . traZODone (DESYREL) 150 MG tablet Take 300 mg by mouth at bedtime.    . diphenoxylate-atropine (LOMOTIL) 2.5-0.025 MG tablet Take 1 tablet by mouth 4 (four) times daily as needed for diarrhea or loose stools. (Patient not taking: Reported on 03/15/2017) 15 tablet 0   No current facility-administered medications for this visit.     Allergies as of 03/15/2017 - Review Complete 03/15/2017  Allergen  Reaction Noted  . Ciprofloxacin Rash   . Kiwi extract Other (See Comments) 11/25/2015    Family History  Problem Relation Age of Onset  . Diabetes Father   . Colon cancer Maternal Uncle   . Liver disease Neg Hx     Social History   Social History  . Marital status: Divorced    Spouse name: N/A  . Number of children: 2  . Years of education: N/A   Occupational History  . disabled    Social History Main Topics  . Smoking status: Current Every Day Smoker    Packs/day: 1.00    Years: 30.00    Types: Cigarettes    Last attempt to quit: 11/13/2015  . Smokeless tobacco: Never Used     Comment: started back x 3 months/ a pack a day  . Alcohol use No  . Drug use: Yes    Types: Marijuana     Comment: last used 1 week ago  . Sexual activity: No   Other Topics Concern  . None   Social History Narrative   Lives w/ mother   2 grown children    Review of Systems: As mentioned in HPI   Physical Exam: BP 107/69   Pulse 60   Temp 98.3 F (36.8 C) (Oral)   Ht 5\' 4"  (1.626 m)   Wt 158 lb 3.2 oz (71.8 kg)   BMI 27.15 kg/m  General:   Alert and oriented. No distress noted. Pleasant and cooperative.  Head:  Normocephalic and atraumatic. Eyes:  Conjuctiva clear without scleral icterus. Mouth:  Oral mucosa pink and moist. Good dentition. No lesions. Abdomen:  +BS, soft, non-tender and non-distended. No rebound or guarding. No HSM or masses noted. Msk:  Symmetrical without gross deformities. Normal posture. Extremities:  Without edema. Neurologic:  Alert and  oriented x4;  grossly normal neurologically. Psych:  Alert and cooperative. Normal mood and affect.  Lab Results  Component Value Date   WBC 10.4 12/05/2016   HGB 14.6 12/05/2016   HCT 42.6 12/05/2016   MCV 91.6 12/05/2016   PLT 285 12/05/2016   Lab Results  Component Value Date   ALT 23 12/05/2016   AST 26 12/05/2016   ALKPHOS 58 12/05/2016   BILITOT 0.8 12/05/2016   Lab Results  Component Value Date    CREATININE 1.10 (H) 03/07/2017   BUN 12 12/05/2016   NA 135 12/05/2016   K 3.6 12/05/2016   CL 102 12/05/2016   CO2 23 12/05/2016

## 2017-03-15 NOTE — Assessment & Plan Note (Addendum)
61 year old with well-compensated cirrhosis. Due for EGD in 2019. Recent imaging on file, negative for [place]. Labs recently completed in April, so I will hold off on repeating these so soon. LFTs normal.   Will have her return in 6 months with ultrasound liver prior to this appointment. EGD for variceal screening will be scheduled at that time.

## 2017-03-15 NOTE — Assessment & Plan Note (Addendum)
Continue Linzess 290 mcg once daily. Routine screening colonoscopy to be arranged at next visit in 6 months.

## 2017-03-15 NOTE — Progress Notes (Signed)
CC'D TO PCP °

## 2017-03-15 NOTE — Assessment & Plan Note (Signed)
Protonix 40 mg once daily, with Zantac in evenings.

## 2017-06-07 DIAGNOSIS — Z23 Encounter for immunization: Secondary | ICD-10-CM | POA: Diagnosis not present

## 2017-07-20 ENCOUNTER — Telehealth: Payer: Self-pay | Admitting: Internal Medicine

## 2017-07-20 ENCOUNTER — Encounter: Payer: Self-pay | Admitting: *Deleted

## 2017-07-20 NOTE — Telephone Encounter (Signed)
Letter mailed

## 2017-07-20 NOTE — Telephone Encounter (Signed)
Recall for ultrasound 

## 2017-08-01 ENCOUNTER — Telehealth: Payer: Self-pay

## 2017-08-01 ENCOUNTER — Other Ambulatory Visit: Payer: Self-pay

## 2017-08-01 DIAGNOSIS — K7460 Unspecified cirrhosis of liver: Secondary | ICD-10-CM

## 2017-08-01 NOTE — Telephone Encounter (Signed)
Pt called office, she received recall letter for [place]. [place] abd RUQ scheduled for 08/16/17 at 8:30am, pt to arrive at 8:15am. NPO after midnight prior to test. Called pt back and informed her of [place] appt. Letter also mailed.

## 2017-08-02 DIAGNOSIS — K210 Gastro-esophageal reflux disease with esophagitis: Secondary | ICD-10-CM | POA: Diagnosis not present

## 2017-08-02 DIAGNOSIS — J441 Chronic obstructive pulmonary disease with (acute) exacerbation: Secondary | ICD-10-CM | POA: Diagnosis not present

## 2017-08-16 ENCOUNTER — Telehealth: Payer: Self-pay | Admitting: Internal Medicine

## 2017-08-16 ENCOUNTER — Encounter: Payer: Self-pay | Admitting: Internal Medicine

## 2017-08-16 ENCOUNTER — Ambulatory Visit (HOSPITAL_COMMUNITY)
Admission: RE | Admit: 2017-08-16 | Discharge: 2017-08-16 | Disposition: A | Discharge status: Home / Self Care | Payer: Medicare Other | Source: Ambulatory Visit | Attending: Internal Medicine | Admitting: Internal Medicine

## 2017-08-16 DIAGNOSIS — K7460 Unspecified cirrhosis of liver: Secondary | ICD-10-CM | POA: Insufficient documentation

## 2017-08-16 DIAGNOSIS — Z9049 Acquired absence of other specified parts of digestive tract: Secondary | ICD-10-CM | POA: Insufficient documentation

## 2017-08-16 NOTE — Progress Notes (Signed)
Pt notified of results

## 2017-08-16 NOTE — Telephone Encounter (Signed)
Patient had ultrasound done today. Signing off message

## 2017-08-16 NOTE — Telephone Encounter (Signed)
RECALL FOR ULTRASOUND 

## 2017-08-31 ENCOUNTER — Ambulatory Visit (INDEPENDENT_AMBULATORY_CARE_PROVIDER_SITE_OTHER): Payer: Medicare Other | Admitting: Otolaryngology

## 2017-08-31 DIAGNOSIS — K219 Gastro-esophageal reflux disease without esophagitis: Secondary | ICD-10-CM

## 2017-08-31 DIAGNOSIS — R490 Dysphonia: Secondary | ICD-10-CM

## 2017-09-20 ENCOUNTER — Encounter: Payer: Self-pay | Admitting: Gastroenterology

## 2017-09-20 ENCOUNTER — Ambulatory Visit: Payer: Medicare Other | Admitting: Gastroenterology

## 2017-09-20 ENCOUNTER — Encounter: Payer: Self-pay | Admitting: *Deleted

## 2017-09-20 ENCOUNTER — Other Ambulatory Visit: Payer: Self-pay | Admitting: *Deleted

## 2017-09-20 ENCOUNTER — Telehealth: Payer: Self-pay | Admitting: *Deleted

## 2017-09-20 VITALS — BP 114/67 | HR 56 | Temp 97.3°F | Ht 64.5 in | Wt 175.4 lb

## 2017-09-20 DIAGNOSIS — K7460 Unspecified cirrhosis of liver: Secondary | ICD-10-CM

## 2017-09-20 DIAGNOSIS — Z1211 Encounter for screening for malignant neoplasm of colon: Secondary | ICD-10-CM | POA: Diagnosis not present

## 2017-09-20 DIAGNOSIS — K5900 Constipation, unspecified: Secondary | ICD-10-CM | POA: Diagnosis not present

## 2017-09-20 DIAGNOSIS — K219 Gastro-esophageal reflux disease without esophagitis: Secondary | ICD-10-CM | POA: Diagnosis not present

## 2017-09-20 MED ORDER — LUBIPROSTONE 24 MCG PO CAPS: 24.0000 ug | ORAL_CAPSULE | Freq: Two times a day (BID) | ORAL | 3 refills | Status: DC

## 2017-09-20 MED ORDER — PEG 3350-KCL-NA BICARB-NACL 420 G PO SOLR: 4000.0000 mL | Freq: Once | ORAL | 0 refills | Status: AC

## 2017-09-20 NOTE — Assessment & Plan Note (Signed)
Unable to afford Linzess 290 mcg due to cost. I am not sure if Amitiza will be more cost-effective, but I have provided 24 mcg samples and sent to pharmacy. May need to just do Miralax, which she is currently taking now.

## 2017-09-20 NOTE — H&P (View-Only) (Signed)
Referring Provider: Sinda Du, MD Primary Care Physician:  Sinda Du, MD Primary GI: Dr. Gala Romney   Chief Complaint  Patient presents with  . Cirrhosis    f/u, doing ok    HPI:   [name] is a 62 y.o. female presenting today with a history of cirrhosis likely secondary to NASH, due for EGD in 2019. Constipation had been well managed with Linzess 290 mcg daily but now too expensive. Protonix for GERD. She is also due for routine screening colonoscopy now. US abdomen up-to-date and due in June 2019. History of peripancreatic mass posterior to distal stomach that has been followed by Dr. [place] Dienes. Most consistent with lymph node. Recent CT on file from July 2018 documenting this.   Constipation: taking Miralax as Linzess was 145$ for 30 day supply. Taking Miralax daily. States working ok. Would like to see how much Amitiza would be. She has tried this in the past and feels like it worked fine.   GERD: Protonix once daily. Sometimes when swallowing feels like an air bubble gets in her throat and hard to go down. Occurs just with liquids. Solid food without issues.   Past Medical History:  Diagnosis Date  . Chronic constipation   . Cirrhosis ([place])    child pugh class A and MELD 5, completed hep A and B vaccine series, no HCC on 3/16 MRI  . Colonic inertia   . Depression   . Diverticula of colon   . Diverticulosis 01/31/2008   colonoscopy Dr Gala Romney  . GERD (gastroesophageal reflux disease)   . Hyperplastic colon polyp   . Liver mass   . Obesity   . PUD (peptic ulcer disease) 1988  . Schizoaffective disorder    Mental Health-Dr Elias Else  . Wrist fracture    Left wrist s/p fall and surgical repair    Past Surgical History:  Procedure Laterality Date  . ABDOMINAL HYSTERECTOMY    . CHOLECYSTECTOMY  08/2010   cholelithiasis; Dr Arnoldo Morale  . COLONOSCOPY  01/31/2008   Dr. Gala Romney- normal rectum, L sided diverticula, long tortuous colon. hyperplasitic  polyp.  Marland Kitchen ERCP W/  SPHINCTEROTOMY AND BALLOON DILATION  08/05/2010   normal ampulla/mild erythema in the gastric remnant without ulcerations or erosions. normal retroflexed view of the cardia/anastomosis normal/distal common bile duct stones s/p extraction and shincterotomy  . ESOPHAGOGASTRODUODENOSCOPY N/A 10/24/2013   [id number]:IDPOEU esophagus. Surgically altered stomach. Abnormal gastric mucosa  -  status post biopsy (reactive gastrophathy)  . ESOPHAGOGASTRODUODENOSCOPY (EGD) WITH PROPOFOL N/A 11/30/2015   LA grade A esophagitis, no varices, surveillance in 2019  . EUS N/A 08/20/2015   Dr. Ardis Hughs: well-circumscribed 5.2 cm chronic mass in porta hepatis, internally mass is gelatinous. Small distal esophageal varices (limited view), Bilroth 1 type anatomy, cytology NEGATIVE for malignant cells. Suspicion for neoplasm low   . FOOT SURGERY     left  . GASTRIC BYPASS     with Revision, Dr. Duffy Rhody  . S/P Hysterectomy     partial  . TONSILLECTOMY    . WRIST SURGERY Left 05/2016    Current Outpatient Medications  Medication Sig Dispense Refill  . alendronate (FOSAMAX) 70 MG tablet Take 70 mg by mouth once a week. Take with a full glass of water on an empty stomach.    . Calcium Carbonate-Vitamin D (CALCIUM 600/VITAMIN D PO) Take 2 tablets by mouth daily.    Marland Kitchen FLUoxetine (PROZAC) 40 MG capsule Take 40 mg by mouth daily.     . pantoprazole (  PROTONIX) 40 MG tablet Take 1 tablet (40 mg total) by mouth daily. 30 tablet 12  . ranitidine (ZANTAC) 150 MG capsule Take 150 mg by mouth every evening.    . risperiDONE (RISPERDAL) 2 MG tablet Take 3 mg by mouth at bedtime.     . traZODone (DESYREL) 150 MG tablet Take 300 mg by mouth at bedtime.    Marland Kitchen lubiprostone (AMITIZA) 24 MCG capsule Take 1 capsule (24 mcg total) by mouth 2 (two) times daily with a meal. 60 capsule 3   No current facility-administered medications for this visit.     Allergies as of 09/20/2017 - Review Complete 09/20/2017  Allergen Reaction Noted  .  Ciprofloxacin Rash   . Kiwi extract Other (See Comments) 11/25/2015    Family History  Problem Relation Age of Onset  . Diabetes Father   . Colon cancer Maternal Uncle   . Liver disease Neg Hx     Social History   Socioeconomic History  . Marital status: Divorced    Spouse name: None  . Number of children: 2  . Years of education: None  . Highest education level: None  Social Needs  . Financial resource strain: None  . Food insecurity - worry: None  . Food insecurity - inability: None  . Transportation needs - medical: None  . Transportation needs - non-medical: None  Occupational History  . Occupation: disabled  Tobacco Use  . Smoking status: Former Smoker    Packs/day: 1.00    Years: 30.00    Pack years: 30.00    Types: Cigarettes    Last attempt to quit: 11/13/2015    Years since quitting: 1.8  . Smokeless tobacco: Never Used  . Tobacco comment: stopped smoking 08/15/17  Substance and Sexual Activity  . Alcohol use: No    Alcohol/week: 0.0 oz  . Drug use: No    Comment: denied 09/20/17  . Sexual activity: No  Other Topics Concern  . None  Social History Narrative   Lives w/ mother   2 grown children    Review of Systems: Gen: Denies fever, chills, anorexia. Denies fatigue, weakness, weight loss.  CV: Denies chest pain, palpitations, syncope, peripheral edema, and claudication. Resp: Denies dyspnea at rest, cough, wheezing, coughing up blood, and pleurisy. GI: see HPI  Derm: Denies rash, itching, dry skin Psych: Denies depression, anxiety, memory loss, confusion. No homicidal or suicidal ideation.  Heme: Denies bruising, bleeding, and enlarged lymph nodes.  Physical Exam: BP 114/67   Pulse (!) 56   Temp (!) 97.3 F (36.3 C) (Oral)   Ht 5' 4.5" (1.638 m)   Wt 175 lb 6.4 oz (79.6 kg)   BMI 29.64 kg/m  General:   Alert and oriented. No distress noted. Pleasant and cooperative.  Head:  Normocephalic and atraumatic. Eyes:  Conjuctiva clear without  scleral icterus. Mouth:  Oral mucosa pink and moist. Lungs: clear bilaterally Cardiac: S1 S2 present without murmurs  Abdomen:  +BS, soft, non-tender and non-distended. No rebound or guarding.  Msk:  Symmetrical without gross deformities. Normal posture. Extremities:  Without edema. Neurologic:  Alert and  oriented x4 Psych:  Alert and cooperative. Normal mood and affect.

## 2017-09-20 NOTE — Assessment & Plan Note (Signed)
Due for routine screening, with last completed in 2009 with diverticula and long, torturous colon. No concerning lower GI symptoms. No first degree relatives with history of colon cancer.  Proceed with TCS with Dr. Gala Romney in near future: the risks, benefits, and alternatives have been discussed with the patient in detail. The patient states understanding and desires to proceed. Propofol due to polypharmacy.

## 2017-09-20 NOTE — Assessment & Plan Note (Signed)
Continue Protonix once daily.  

## 2017-09-20 NOTE — Assessment & Plan Note (Signed)
62 year old female with well-compensated cirrhosis likely secondary to NASH, due for screening EGD now. Last in 2017 without varices. Recent US on file, due again in 6 months. Due for routine labs now. She notes vague dysphagia with liquids but not solids. Could consider dilation at time of EGD if necessary.   CBC, CMP, INR now Proceed with upper endoscopy +/- dilation in the near future with Dr. Gala Romney. The risks, benefits, and alternatives have been discussed in detail with patient. They have stated understanding and desire to proceed.  PROPOFOL due to polypharmacy.  [place] in 6 months Return in 6 months

## 2017-09-20 NOTE — Telephone Encounter (Addendum)
Called, NA. Pre-op scheduled for 10/13/17 at 11:00am. Letter mailed

## 2017-09-20 NOTE — Progress Notes (Signed)
Referring Provider: Sinda Du, MD Primary Care Physician:  Sinda Du, MD Primary GI: Dr. Gala Romney   Chief Complaint  Patient presents with  . Cirrhosis    f/u, doing ok    HPI:   [name] is a 62 y.o. female presenting today with a history of cirrhosis likely secondary to NASH, due for EGD in 2019. Constipation had been well managed with Linzess 290 mcg daily but now too expensive. Protonix for GERD. She is also due for routine screening colonoscopy now. US abdomen up-to-date and due in June 2019. History of peripancreatic mass posterior to distal stomach that has been followed by Dr. Barry Dienes. Most consistent with lymph node. Recent CT on file from July 2018 documenting this.   Constipation: taking Miralax as Linzess was 145$ for 30 day supply. Taking Miralax daily. States working ok. Would like to see how much Amitiza would be. She has tried this in the past and feels like it worked fine.   GERD: Protonix once daily. Sometimes when swallowing feels like an air bubble gets in her throat and hard to go down. Occurs just with liquids. Solid food without issues.   Past Medical History:  Diagnosis Date  . Chronic constipation   . Cirrhosis ([place])    child pugh class A and MELD 5, completed hep A and B vaccine series, no HCC on 3/16 MRI  . Colonic inertia   . Depression   . Diverticula of colon   . Diverticulosis 01/31/2008   colonoscopy Dr Gala Romney  . GERD (gastroesophageal reflux disease)   . Hyperplastic colon polyp   . Liver mass   . Obesity   . PUD (peptic ulcer disease) 1988  . Schizoaffective disorder    Mental Health-Dr Elias Else  . Wrist fracture    Left wrist s/p fall and surgical repair    Past Surgical History:  Procedure Laterality Date  . ABDOMINAL HYSTERECTOMY    . CHOLECYSTECTOMY  08/2010   cholelithiasis; Dr Arnoldo Morale  . COLONOSCOPY  01/31/2008   Dr. Gala Romney- normal rectum, L sided diverticula, long tortuous colon. hyperplasitic  polyp.  Marland Kitchen ERCP W/  SPHINCTEROTOMY AND BALLOON DILATION  08/05/2010   normal ampulla/mild erythema in the gastric remnant without ulcerations or erosions. normal retroflexed view of the cardia/anastomosis normal/distal common bile duct stones s/p extraction and shincterotomy  . ESOPHAGOGASTRODUODENOSCOPY N/A 10/24/2013   GGY:IRSWNI esophagus. Surgically altered stomach. Abnormal gastric mucosa  -  status post biopsy (reactive gastrophathy)  . ESOPHAGOGASTRODUODENOSCOPY (EGD) WITH PROPOFOL N/A 11/30/2015   LA grade A esophagitis, no varices, surveillance in 2019  . EUS N/A 08/20/2015   Dr. Ardis Hughs: well-circumscribed 5.2 cm chronic mass in porta hepatis, internally mass is gelatinous. Small distal esophageal varices (limited view), Bilroth 1 type anatomy, cytology NEGATIVE for malignant cells. Suspicion for neoplasm low   . FOOT SURGERY     left  . GASTRIC BYPASS     with Revision, Dr. Duffy Rhody  . S/P Hysterectomy     partial  . TONSILLECTOMY    . WRIST SURGERY Left 05/2016    Current Outpatient Medications  Medication Sig Dispense Refill  . alendronate (FOSAMAX) 70 MG tablet Take 70 mg by mouth once a week. Take with a full glass of water on an empty stomach.    . Calcium Carbonate-Vitamin D (CALCIUM 600/VITAMIN D PO) Take 2 tablets by mouth daily.    Marland Kitchen FLUoxetine (PROZAC) 40 MG capsule Take 40 mg by mouth daily.     . pantoprazole (  PROTONIX) 40 MG tablet Take 1 tablet (40 mg total) by mouth daily. 30 tablet 12  . ranitidine (ZANTAC) 150 MG capsule Take 150 mg by mouth every evening.    . risperiDONE (RISPERDAL) 2 MG tablet Take 3 mg by mouth at bedtime.     . traZODone (DESYREL) 150 MG tablet Take 300 mg by mouth at bedtime.    Marland Kitchen lubiprostone (AMITIZA) 24 MCG capsule Take 1 capsule (24 mcg total) by mouth 2 (two) times daily with a meal. 60 capsule 3   No current facility-administered medications for this visit.     Allergies as of 09/20/2017 - Review Complete 09/20/2017  Allergen Reaction Noted  .  Ciprofloxacin Rash   . Kiwi extract Other (See Comments) 11/25/2015    Family History  Problem Relation Age of Onset  . Diabetes Father   . Colon cancer Maternal Uncle   . Liver disease Neg Hx     Social History   Socioeconomic History  . Marital status: Divorced    Spouse name: None  . Number of children: 2  . Years of education: None  . Highest education level: None  Social Needs  . Financial resource strain: None  . Food insecurity - worry: None  . Food insecurity - inability: None  . Transportation needs - medical: None  . Transportation needs - non-medical: None  Occupational History  . Occupation: disabled  Tobacco Use  . Smoking status: Former Smoker    Packs/day: 1.00    Years: 30.00    Pack years: 30.00    Types: Cigarettes    Last attempt to quit: 11/13/2015    Years since quitting: 1.8  . Smokeless tobacco: Never Used  . Tobacco comment: stopped smoking 08/15/17  Substance and Sexual Activity  . Alcohol use: No    Alcohol/week: 0.0 oz  . Drug use: No    Comment: denied 09/20/17  . Sexual activity: No  Other Topics Concern  . None  Social History Narrative   Lives w/ mother   2 grown children    Review of Systems: Gen: Denies fever, chills, anorexia. Denies fatigue, weakness, weight loss.  CV: Denies chest pain, palpitations, syncope, peripheral edema, and claudication. Resp: Denies dyspnea at rest, cough, wheezing, coughing up blood, and pleurisy. GI: see HPI  Derm: Denies rash, itching, dry skin Psych: Denies depression, anxiety, memory loss, confusion. No homicidal or suicidal ideation.  Heme: Denies bruising, bleeding, and enlarged lymph nodes.  Physical Exam: BP 114/67   Pulse (!) 56   Temp (!) 97.3 F (36.3 C) (Oral)   Ht 5' 4.5" (1.638 m)   Wt 175 lb 6.4 oz (79.6 kg)   BMI 29.64 kg/m  General:   Alert and oriented. No distress noted. Pleasant and cooperative.  Head:  Normocephalic and atraumatic. Eyes:  Conjuctiva clear without  scleral icterus. Mouth:  Oral mucosa pink and moist. Lungs: clear bilaterally Cardiac: S1 S2 present without murmurs  Abdomen:  +BS, soft, non-tender and non-distended. No rebound or guarding.  Msk:  Symmetrical without gross deformities. Normal posture. Extremities:  Without edema. Neurologic:  Alert and  oriented x4 Psych:  Alert and cooperative. Normal mood and affect.

## 2017-09-20 NOTE — Telephone Encounter (Signed)
Called pt again and not available. Letter was mailed with appt information

## 2017-09-20 NOTE — Patient Instructions (Addendum)
We are setting you up for a colonoscopy, upper endoscopy, and possible dilation if needed by Dr. Gala Romney in the near future.  Please have blood work done today.  Your next ultrasound will be in 6 months, and we will see you in 6 months!  For constipation: I have given samples of Amitiza to take twice a day with food. I also sent this to the pharmacy to see if it is cost-effective.   I am glad you are doing well! Have a great 2019!

## 2017-09-21 NOTE — Progress Notes (Signed)
CC'D TO PCP °

## 2017-10-10 NOTE — Patient Instructions (Signed)
[name]  10/10/2017     @PREFPERIOPPHARMACY @   Your procedure is scheduled on  10/19/2017   Report to [facility] at  700  A.M.  Call this number if you have problems the morning of surgery:  [phone]   Remember:  Do not eat food or drink liquids after midnight.  Take these medicines the morning of surgery with A SIP OF WATER  Prozac, protonix.   Do not wear jewelry, make-up or nail polish.  Do not wear lotions, powders, or perfumes, or deodorant.  Do not shave 48 hours prior to surgery.  Men may shave face and neck.  Do not bring valuables to the hospital.  [facility] is not responsible for any belongings or valuables.  Contacts, dentures or bridgework may not be worn into surgery.  Leave your suitcase in the car.  After surgery it may be brought to your room.  For patients admitted to the hospital, discharge time will be determined by your treatment team.  Patients discharged the day of surgery will not be allowed to drive home.   Name and phone number of your driver:   Family Special instructions:  Follow the diet and prep instructions given to you by Dr Roseanne Kaufman office.  Please read over the following fact sheets that you were given. Anesthesia Post-op Instructions and Care and Recovery After Surgery       Esophagogastroduodenoscopy Esophagogastroduodenoscopy (EGD) is a procedure to examine the lining of the esophagus, stomach, and first part of the small intestine (duodenum). This procedure is done to check for problems such as inflammation, bleeding, ulcers, or growths. During this procedure, a long, flexible, lighted tube with a camera attached (endoscope) is inserted down the throat. Tell a health care provider about:  Any allergies you have.  All medicines you are taking, including vitamins, herbs, eye drops, creams, and over-the-counter medicines.  Any problems you or family members have had with anesthetic  medicines.  Any blood disorders you have.  Any surgeries you have had.  Any medical conditions you have.  Whether you are pregnant or may be pregnant. What are the risks? Generally, this is a safe procedure. However, problems may occur, including:  Infection.  Bleeding.  A tear (perforation) in the esophagus, stomach, or duodenum.  Trouble breathing.  Excessive sweating.  Spasms of the larynx.  A slowed heartbeat.  Low blood pressure.  What happens before the procedure?  Follow instructions from your health care provider about eating or drinking restrictions.  Ask your health care provider about: ? Changing or stopping your regular medicines. This is especially important if you are taking diabetes medicines or blood thinners. ? Taking medicines such as aspirin and ibuprofen. These medicines can thin your blood. Do not take these medicines before your procedure if your health care provider instructs you not to.  Plan to have someone take you home after the procedure.  If you wear dentures, be ready to remove them before the procedure. What happens during the procedure?  To reduce your risk of infection, your health care team will wash or sanitize their hands.  An IV tube will be put in a vein in your hand or arm. You will get medicines and fluids through this tube.  You will be given one or more of the following: ? A medicine to help you relax (sedative). ? A medicine to numb the area (local  anesthetic). This medicine may be sprayed into your throat. It will make you feel more comfortable and keep you from gagging or coughing during the procedure. ? A medicine for pain.  A mouth guard may be placed in your mouth to protect your teeth and to keep you from biting on the endoscope.  You will be asked to lie on your left side.  The endoscope will be lowered down your throat into your esophagus, stomach, and duodenum.  Air will be put into the endoscope. This will  help your health care provider see better.  The lining of your esophagus, stomach, and duodenum will be examined.  Your health care provider may: ? Take a tissue sample so it can be looked at in a lab (biopsy). ? Remove growths. ? Remove objects (foreign bodies) that are stuck. ? Treat any bleeding with medicines or other devices that stop tissue from bleeding. ? Widen (dilate) or stretch narrowed areas of your esophagus and stomach.  The endoscope will be taken out. The procedure may vary among health care providers and hospitals. What happens after the procedure?  Your blood pressure, heart rate, breathing rate, and blood oxygen level will be monitored often until the medicines you were given have worn off.  Do not eat or drink anything until the numbing medicine has worn off and your gag reflex has returned. This information is not intended to replace advice given to you by your health care provider. Make sure you discuss any questions you have with your health care provider. Document Released: 12/23/2004 Document Revised: 01/28/2016 Document Reviewed: 07/16/2015 Elsevier Interactive Patient Education  2018 [employer]. Esophagogastroduodenoscopy, Care After Refer to this sheet in the next few weeks. These instructions provide you with information about caring for yourself after your procedure. Your health care provider may also give you more specific instructions. Your treatment has been planned according to current medical practices, but problems sometimes occur. Call your health care provider if you have any problems or questions after your procedure. What can I expect after the procedure? After the procedure, it is common to have:  A sore throat.  Nausea.  Bloating.  Dizziness.  Fatigue.  Follow these instructions at home:  Do not eat or drink anything until the numbing medicine (local anesthetic) has worn off and your gag reflex has returned. You will know that the  local anesthetic has worn off when you can swallow comfortably.  Do not drive for 24 hours if you received a medicine to help you relax (sedative).  If your health care provider took a tissue sample for testing during the procedure, make sure to get your test results. This is your responsibility. Ask your health care provider or the department performing the test when your results will be ready.  Keep all follow-up visits as told by your health care provider. This is important. Contact a health care provider if:  You cannot stop coughing.  You are not urinating.  You are urinating less than usual. Get help right away if:  You have trouble swallowing.  You cannot eat or drink.  You have throat or chest pain that gets worse.  You are dizzy or light-headed.  You faint.  You have nausea or vomiting.  You have chills.  You have a fever.  You have severe abdominal pain.  You have black, tarry, or bloody stools. This information is not intended to replace advice given to you by your health care provider. Make sure you discuss any  questions you have with your health care provider. Document Released: 08/08/2012 Document Revised: 01/28/2016 Document Reviewed: 07/16/2015 Elsevier Interactive Patient Education  2018 [employer].  Esophageal Dilatation Esophageal dilatation is a procedure to open a blocked or narrowed part of the esophagus. The esophagus is the long tube in your throat that carries food and liquid from your mouth to your stomach. The procedure is also called esophageal dilation. You may need this procedure if you have a buildup of scar tissue in your esophagus that makes it difficult, painful, or even impossible to swallow. This can be caused by gastroesophageal reflux disease (GERD). In rare cases, people need this procedure because they have cancer of the esophagus or a problem with the way food moves through the esophagus. Sometimes you may need to have another  dilatation to enlarge the opening of the esophagus gradually. Tell a health care provider about:  Any allergies you have.  All medicines you are taking, including vitamins, herbs, eye drops, creams, and over-the-counter medicines.  Any problems you or family members have had with anesthetic medicines.  Any blood disorders you have.  Any surgeries you have had.  Any medical conditions you have.  Any antibiotic medicines you are required to take before dental procedures. What are the risks? Generally, this is a safe procedure. However, problems can occur and include:  Bleeding from a tear in the lining of the esophagus.  A hole (perforation) in the esophagus.  What happens before the procedure?  Do not eat or drink anything after midnight on the night before the procedure or as directed by your health care provider.  Ask your health care provider about changing or stopping your regular medicines. This is especially important if you are taking diabetes medicines or blood thinners.  Plan to have someone take you home after the procedure. What happens during the procedure?  You will be given a medicine that makes you relaxed and sleepy (sedative).  A medicine may be sprayed or gargled to numb the back of the throat.  Your health care provider can use various instruments to do an esophageal dilatation. During the procedure, the instrument used will be placed in your mouth and passed down into your esophagus. Options include: ? Simple dilators. This instrument is carefully placed in the esophagus to stretch it. ? Guided wire bougies. In this method, a flexible tube (endoscope) is used to insert a wire into the esophagus. The dilator is passed over this wire to enlarge the esophagus. Then the wire is removed. ? Balloon dilators. An endoscope with a small balloon at the end is passed down into the esophagus. Inflating the balloon gently stretches the esophagus and opens it up. What  happens after the procedure?  Your blood pressure, heart rate, breathing rate, and blood oxygen level will be monitored often until the medicines you were given have worn off.  Your throat may feel slightly sore and will probably still feel numb. This will improve slowly over time.  You will not be allowed to eat or drink until the throat numbness has resolved.  If this is a same-day procedure, you may be allowed to go home once you have been able to drink, urinate, and sit on the edge of the bed without nausea or dizziness.  If this is a same-day procedure, you should have a friend or family member with you for the next 24 hours after the procedure. This information is not intended to replace advice given to you by your  health care provider. Make sure you discuss any questions you have with your health care provider. Document Released: 10/13/2005 Document Revised: 01/28/2016 Document Reviewed: 01/01/2014 Elsevier Interactive Patient Education  [employer].  Colonoscopy, Adult A colonoscopy is an exam to look at the large intestine. It is done to check for problems, such as:  Lumps (tumors).  Growths (polyps).  Swelling (inflammation).  Bleeding.  What happens before the procedure? Eating and drinking Follow instructions from your doctor about eating and drinking. These instructions may include:  A few days before the procedure - follow a low-fiber diet. ? Avoid nuts. ? Avoid seeds. ? Avoid dried fruit. ? Avoid raw fruits. ? Avoid vegetables.  1-3 days before the procedure - follow a clear liquid diet. Avoid liquids that have red or purple dye. Drink only clear liquids, such as: ? Clear broth or bouillon. ? Black coffee or tea. ? Clear juice. ? Clear soft drinks or sports drinks. ? Gelatin dessert. ? Popsicles.  On the day of the procedure - do not eat or drink anything during the 2 hours before the procedure.  Bowel prep If you were prescribed an oral bowel  prep:  Take it as told by your doctor. Starting the day before your procedure, you will need to drink a lot of liquid. The liquid will cause you to poop (have bowel movements) until your poop is almost clear or light green.  If your skin or butt gets irritated from diarrhea, you may: ? Wipe the area with wipes that have medicine in them, such as adult wet wipes with aloe and vitamin E. ? Put something on your skin that soothes the area, such as petroleum jelly.  If you throw up (vomit) while drinking the bowel prep, take a break for up to 60 minutes. Then begin the bowel prep again. If you keep throwing up and you cannot take the bowel prep without throwing up, call your doctor.  General instructions  Ask your doctor about changing or stopping your normal medicines. This is important if you take diabetes medicines or blood thinners.  Plan to have someone take you home from the hospital or clinic. What happens during the procedure?  An IV tube may be put into one of your veins.  You will be given medicine to help you relax (sedative).  To reduce your risk of infection: ? Your doctors will wash their hands. ? Your anal area will be washed with soap.  You will be asked to lie on your side with your knees bent.  Your doctor will get a long, thin, flexible tube ready. The tube will have a camera and a light on the end.  The tube will be put into your anus.  The tube will be gently put into your large intestine.  Air will be delivered into your large intestine to keep it open. You may feel some pressure or cramping.  The camera will be used to take photos.  A small tissue sample may be removed from your body to be looked at under a microscope (biopsy). If any possible problems are found, the tissue will be sent to a lab for testing.  If small growths are found, your doctor may remove them and have them checked for cancer.  The tube that was put into your anus will be slowly  removed. The procedure may vary among doctors and hospitals. What happens after the procedure?  Your doctor will check on you often until the medicines you  were given have worn off.  Do not drive for 24 hours after the procedure.  You may have a small amount of blood in your poop.  You may pass gas.  You may have mild cramps or bloating in your belly (abdomen).  It is up to you to get the results of your procedure. Ask your doctor, or the department performing the procedure, when your results will be ready. This information is not intended to replace advice given to you by your health care provider. Make sure you discuss any questions you have with your health care provider. Document Released: 09/24/2010 Document Revised: 06/22/2016 Document Reviewed: 11/03/2015 Elsevier Interactive Patient Education  2017 [employer]  Colonoscopy, Adult, Care After This sheet gives you information about how to care for yourself after your procedure. Your health care provider may also give you more specific instructions. If you have problems or questions, contact your health care provider. What can I expect after the procedure? After the procedure, it is common to have:  A small amount of blood in your stool for 24 hours after the procedure.  Some gas.  Mild abdominal cramping or bloating.  Follow these instructions at home: General instructions   For the first 24 hours after the procedure: ? Do not drive or use machinery. ? Do not sign important documents. ? Do not drink alcohol. ? Do your regular daily activities at a slower pace than normal. ? Eat soft, easy-to-digest foods. ? Rest often.  Take over-the-counter or prescription medicines only as told by your health care provider.  It is up to you to get the results of your procedure. Ask your health care provider, or the department performing the procedure, when your results will be ready. Relieving cramping and bloating  Try  walking around when you have cramps or feel bloated.  Apply heat to your abdomen as told by your health care provider. Use a heat source that your health care provider recommends, such as a moist heat pack or a heating pad. ? Place a towel between your skin and the heat source. ? Leave the heat on for 20-30 minutes. ? Remove the heat if your skin turns bright red. This is especially important if you are unable to feel pain, heat, or cold. You may have a greater risk of getting burned. Eating and drinking  Drink enough fluid to keep your urine clear or pale yellow.  Resume your normal diet as instructed by your health care provider. Avoid heavy or fried foods that are hard to digest.  Avoid drinking alcohol for as long as instructed by your health care provider. Contact a health care provider if:  You have blood in your stool 2-3 days after the procedure. Get help right away if:  You have more than a small spotting of blood in your stool.  You pass large blood clots in your stool.  Your abdomen is swollen.  You have nausea or vomiting.  You have a fever.  You have increasing abdominal pain that is not relieved with medicine. This information is not intended to replace advice given to you by your health care provider. Make sure you discuss any questions you have with your health care provider. Document Released: 04/05/2004 Document Revised: 05/16/2016 Document Reviewed: 11/03/2015 Elsevier Interactive Patient Education  2018 [place] Anesthesia is a term that refers to techniques, procedures, and medicines that help a person stay safe and comfortable during a medical procedure. Monitored anesthesia care, or  sedation, is one type of anesthesia. Your anesthesia specialist may recommend sedation if you will be having a procedure that does not require you to be unconscious, such as:  Cataract surgery.  A dental procedure.  A biopsy.  A  colonoscopy.  During the procedure, you may receive a medicine to help you relax (sedative). There are three levels of sedation:  Mild sedation. At this level, you may feel awake and relaxed. You will be able to follow directions.  Moderate sedation. At this level, you will be sleepy. You may not remember the procedure.  Deep sedation. At this level, you will be asleep. You will not remember the procedure.  The more medicine you are given, the deeper your level of sedation will be. Depending on how you respond to the procedure, the anesthesia specialist may change your level of sedation or the type of anesthesia to fit your needs. An anesthesia specialist will monitor you closely during the procedure. Let your health care provider know about:  Any allergies you have.  All medicines you are taking, including vitamins, herbs, eye drops, creams, and over-the-counter medicines.  Any use of steroids (by mouth or as a cream).  Any problems you or family members have had with sedatives and anesthetic medicines.  Any blood disorders you have.  Any surgeries you have had.  Any medical conditions you have, such as sleep apnea.  Whether you are pregnant or may be pregnant.  Any use of cigarettes, alcohol, or street drugs. What are the risks? Generally, this is a safe procedure. However, problems may occur, including:  Getting too much medicine (oversedation).  Nausea.  Allergic reaction to medicines.  Trouble breathing. If this happens, a breathing tube may be used to help with breathing. It will be removed when you are awake and breathing on your own.  Heart trouble.  Lung trouble.  Before the procedure Staying hydrated Follow instructions from your health care provider about hydration, which may include:  Up to 2 hours before the procedure - you may continue to drink clear liquids, such as water, clear fruit juice, black coffee, and plain tea.  Eating and drinking  restrictions Follow instructions from your health care provider about eating and drinking, which may include:  8 hours before the procedure - stop eating heavy meals or foods such as meat, fried foods, or fatty foods.  6 hours before the procedure - stop eating light meals or foods, such as toast or cereal.  6 hours before the procedure - stop drinking milk or drinks that contain milk.  2 hours before the procedure - stop drinking clear liquids.  Medicines Ask your health care provider about:  Changing or stopping your regular medicines. This is especially important if you are taking diabetes medicines or blood thinners.  Taking medicines such as aspirin and ibuprofen. These medicines can thin your blood. Do not take these medicines before your procedure if your health care provider instructs you not to.  Tests and exams  You will have a physical exam.  You may have blood tests done to show: ? How well your kidneys and liver are working. ? How well your blood can clot.  General instructions  Plan to have someone take you home from the hospital or clinic.  If you will be going home right after the procedure, plan to have someone with you for 24 hours.  What happens during the procedure?  Your blood pressure, heart rate, breathing, level of pain and overall condition  will be monitored.  An IV tube will be inserted into one of your veins.  Your anesthesia specialist will give you medicines as needed to keep you comfortable during the procedure. This may mean changing the level of sedation.  The procedure will be performed. After the procedure  Your blood pressure, heart rate, breathing rate, and blood oxygen level will be monitored until the medicines you were given have worn off.  Do not drive for 24 hours if you received a sedative.  You may: ? Feel sleepy, clumsy, or nauseous. ? Feel forgetful about what happened after the procedure. ? Have a sore throat if you had a  breathing tube during the procedure. ? Vomit. This information is not intended to replace advice given to you by your health care provider. Make sure you discuss any questions you have with your health care provider. Document Released: 05/18/2005 Document Revised: 01/29/2016 Document Reviewed: 12/13/2015 Elsevier Interactive Patient Education  2018 [place], Care After These instructions provide you with information about caring for yourself after your procedure. Your health care provider may also give you more specific instructions. Your treatment has been planned according to current medical practices, but problems sometimes occur. Call your health care provider if you have any problems or questions after your procedure. What can I expect after the procedure? After your procedure, it is common to:  Feel sleepy for several hours.  Feel clumsy and have poor balance for several hours.  Feel forgetful about what happened after the procedure.  Have poor judgment for several hours.  Feel nauseous or vomit.  Have a sore throat if you had a breathing tube during the procedure.  Follow these instructions at home: For at least 24 hours after the procedure:   Do not: ? Participate in activities in which you could fall or become injured. ? Drive. ? Use heavy machinery. ? Drink alcohol. ? Take sleeping pills or medicines that cause drowsiness. ? Make important decisions or sign legal documents. ? Take care of children on your own.  Rest. Eating and drinking  Follow the diet that is recommended by your health care provider.  If you vomit, drink water, juice, or soup when you can drink without vomiting.  Make sure you have little or no nausea before eating solid foods. General instructions  Have a responsible adult stay with you until you are awake and alert.  Take over-the-counter and prescription medicines only as told by your health care  provider.  If you smoke, do not smoke without supervision.  Keep all follow-up visits as told by your health care provider. This is important. Contact a health care provider if:  You keep feeling nauseous or you keep vomiting.  You feel light-headed.  You develop a rash.  You have a fever. Get help right away if:  You have trouble breathing. This information is not intended to replace advice given to you by your health care provider. Make sure you discuss any questions you have with your health care provider. Document Released: 12/13/2015 Document Revised: 04/13/2016 Document Reviewed: 12/13/2015 Elsevier Interactive Patient Education  [employer].

## 2017-10-13 ENCOUNTER — Encounter (HOSPITAL_COMMUNITY): Payer: Self-pay

## 2017-10-13 ENCOUNTER — Other Ambulatory Visit: Payer: Self-pay

## 2017-10-13 ENCOUNTER — Encounter (HOSPITAL_COMMUNITY)
Admission: RE | Admit: 2017-10-13 | Discharge: 2017-10-13 | Disposition: A | Discharge status: Home / Self Care | Payer: Medicare Other | Source: Ambulatory Visit | Attending: Internal Medicine | Admitting: Internal Medicine

## 2017-10-13 DIAGNOSIS — Z01818 Encounter for other preprocedural examination: Secondary | ICD-10-CM | POA: Diagnosis not present

## 2017-10-13 HISTORY — DX: Unspecified convulsions: R56.9

## 2017-10-13 LAB — COMPREHENSIVE METABOLIC PANEL
ALT: 18 U/L (ref 14–54)
AST: 20 U/L (ref 15–41)
Albumin: 3.6 g/dL (ref 3.5–5.0)
Alkaline Phosphatase: 56 U/L (ref 38–126)
Anion gap: 8 (ref 5–15)
BUN: 16 mg/dL (ref 6–20)
CO2: 24 mmol/L (ref 22–32)
Calcium: 9 mg/dL (ref 8.9–10.3)
Chloride: 107 mmol/L (ref 101–111)
Creatinine, Ser: 1.1 mg/dL — ABNORMAL HIGH (ref 0.44–1.00)
GFR calc Af Amer: >60 mL/min (ref >60)
GFR calc non Af Amer: 53 mL/min — ABNORMAL LOW (ref >60)
Glucose, Bld: 113 mg/dL — ABNORMAL HIGH (ref 65–99)
Potassium: 3.6 mmol/L (ref 3.5–5.1)
Sodium: 139 mmol/L (ref 135–145)
Total Bilirubin: 0.5 mg/dL (ref 0.3–1.2)
Total Protein: 6.4 g/dL — ABNORMAL LOW (ref 6.5–8.1)

## 2017-10-13 LAB — CBC WITH DIFFERENTIAL/PLATELET
Basophils Absolute: 0.1 10*3/uL (ref 0.0–0.1)
Basophils Relative: 1 %
Eosinophils Absolute: 0.3 10*3/uL (ref 0.0–0.7)
Eosinophils Relative: 3 %
HCT: 38 % (ref 36.0–46.0)
Hemoglobin: 12.2 g/dL (ref 12.0–15.0)
Lymphocytes Relative: 19 %
Lymphs Abs: 1.6 10*3/uL (ref 0.7–4.0)
MCH: 30.2 pg (ref 26.0–34.0)
MCHC: 32.1 g/dL (ref 30.0–36.0)
MCV: 94.1 fL (ref 78.0–100.0)
Monocytes Absolute: 0.8 10*3/uL (ref 0.1–1.0)
Monocytes Relative: 9 %
Neutro Abs: 5.5 10*3/uL (ref 1.7–7.7)
Neutrophils Relative %: 68 %
Platelets: 303 10*3/uL (ref 150–400)
RBC: 4.04 MIL/uL (ref 3.87–5.11)
RDW: 13.3 % (ref 11.5–15.5)
WBC: 8.2 10*3/uL (ref 4.0–10.5)

## 2017-10-13 LAB — PROTIME-INR
INR: 1.04
Prothrombin Time: 13.6 seconds (ref 11.4–15.2)

## 2017-10-19 ENCOUNTER — Ambulatory Visit (HOSPITAL_COMMUNITY)
Admission: RE | Admit: 2017-10-19 | Discharge: 2017-10-19 | Disposition: A | Discharge status: Home / Self Care | Payer: Medicare Other | Source: Ambulatory Visit | Attending: Internal Medicine | Admitting: Internal Medicine

## 2017-10-19 ENCOUNTER — Ambulatory Visit (HOSPITAL_COMMUNITY): Payer: Medicare Other | Admitting: Anesthesiology

## 2017-10-19 ENCOUNTER — Encounter (HOSPITAL_COMMUNITY)
Admission: RE | Disposition: A | Discharge status: Home / Self Care | Payer: Self-pay | Source: Ambulatory Visit | Attending: Internal Medicine

## 2017-10-19 ENCOUNTER — Other Ambulatory Visit: Payer: Self-pay

## 2017-10-19 ENCOUNTER — Encounter (HOSPITAL_COMMUNITY): Payer: Self-pay

## 2017-10-19 DIAGNOSIS — K449 Diaphragmatic hernia without obstruction or gangrene: Secondary | ICD-10-CM | POA: Diagnosis not present

## 2017-10-19 DIAGNOSIS — Z8711 Personal history of peptic ulcer disease: Secondary | ICD-10-CM | POA: Insufficient documentation

## 2017-10-19 DIAGNOSIS — F259 Schizoaffective disorder, unspecified: Secondary | ICD-10-CM | POA: Diagnosis not present

## 2017-10-19 DIAGNOSIS — D125 Benign neoplasm of sigmoid colon: Secondary | ICD-10-CM | POA: Diagnosis not present

## 2017-10-19 DIAGNOSIS — F329 Major depressive disorder, single episode, unspecified: Secondary | ICD-10-CM | POA: Insufficient documentation

## 2017-10-19 DIAGNOSIS — K210 Gastro-esophageal reflux disease with esophagitis: Secondary | ICD-10-CM | POA: Insufficient documentation

## 2017-10-19 DIAGNOSIS — Z9884 Bariatric surgery status: Secondary | ICD-10-CM | POA: Insufficient documentation

## 2017-10-19 DIAGNOSIS — K5909 Other constipation: Secondary | ICD-10-CM | POA: Insufficient documentation

## 2017-10-19 DIAGNOSIS — K209 Esophagitis, unspecified: Secondary | ICD-10-CM | POA: Diagnosis not present

## 2017-10-19 DIAGNOSIS — K5730 Diverticulosis of large intestine without perforation or abscess without bleeding: Secondary | ICD-10-CM | POA: Diagnosis not present

## 2017-10-19 DIAGNOSIS — Z87891 Personal history of nicotine dependence: Secondary | ICD-10-CM | POA: Insufficient documentation

## 2017-10-19 DIAGNOSIS — Z1211 Encounter for screening for malignant neoplasm of colon: Secondary | ICD-10-CM | POA: Diagnosis not present

## 2017-10-19 DIAGNOSIS — K635 Polyp of colon: Secondary | ICD-10-CM | POA: Diagnosis not present

## 2017-10-19 DIAGNOSIS — K219 Gastro-esophageal reflux disease without esophagitis: Secondary | ICD-10-CM | POA: Diagnosis not present

## 2017-10-19 DIAGNOSIS — Z79899 Other long term (current) drug therapy: Secondary | ICD-10-CM | POA: Insufficient documentation

## 2017-10-19 DIAGNOSIS — R1310 Dysphagia, unspecified: Secondary | ICD-10-CM | POA: Diagnosis not present

## 2017-10-19 DIAGNOSIS — K7460 Unspecified cirrhosis of liver: Secondary | ICD-10-CM | POA: Diagnosis not present

## 2017-10-19 HISTORY — PX: POLYPECTOMY: SHX5525

## 2017-10-19 HISTORY — PX: MALONEY DILATION: SHX5535

## 2017-10-19 HISTORY — PX: COLONOSCOPY WITH PROPOFOL: SHX5780

## 2017-10-19 HISTORY — PX: ESOPHAGOGASTRODUODENOSCOPY (EGD) WITH PROPOFOL: SHX5813

## 2017-10-19 SURGERY — COLONOSCOPY WITH PROPOFOL
Anesthesia: Monitor Anesthesia Care

## 2017-10-19 MED ORDER — STERILE WATER FOR IRRIGATION IR SOLN
Status: DC | PRN
  Administered 2017-10-19: 15 mL

## 2017-10-19 MED ORDER — LIDOCAINE VISCOUS 2 % MT SOLN
15.0000 mL | Freq: Once | OROMUCOSAL | Status: AC
  Administered 2017-10-19: 6 mL via OROMUCOSAL

## 2017-10-19 MED ORDER — MIDAZOLAM HCL 2 MG/2ML IJ SOLN
1.0000 mg | INTRAMUSCULAR | Status: AC
  Administered 2017-10-19: 2 mg via INTRAVENOUS

## 2017-10-19 MED ORDER — LIDOCAINE VISCOUS 2 % MT SOLN
OROMUCOSAL | Status: AC
  Filled 2017-10-19: qty 15

## 2017-10-19 MED ORDER — MIDAZOLAM HCL 2 MG/2ML IJ SOLN
INTRAMUSCULAR | Status: AC
  Filled 2017-10-19: qty 2

## 2017-10-19 MED ORDER — FENTANYL CITRATE (PF) 100 MCG/2ML IJ SOLN
25.0000 ug | Freq: Once | INTRAMUSCULAR | Status: AC
  Administered 2017-10-19: 25 ug via INTRAVENOUS

## 2017-10-19 MED ORDER — LIDOCAINE HCL (CARDIAC) 10 MG/ML IV SOLN
INTRAVENOUS | Status: DC | PRN
  Administered 2017-10-19: 30 mg via INTRAVENOUS

## 2017-10-19 MED ORDER — LACTATED RINGERS IV SOLN
INTRAVENOUS | Status: DC
  Administered 2017-10-19: 08:00:00 via INTRAVENOUS

## 2017-10-19 MED ORDER — PROPOFOL 500 MG/50ML IV EMUL
INTRAVENOUS | Status: DC | PRN
  Administered 2017-10-19: 150 ug/kg/min via INTRAVENOUS
  Administered 2017-10-19: 09:00:00 via INTRAVENOUS

## 2017-10-19 MED ORDER — EPHEDRINE SULFATE 50 MG/ML IJ SOLN
INTRAMUSCULAR | Status: DC | PRN
  Administered 2017-10-19: 10 mg via INTRAVENOUS

## 2017-10-19 MED ORDER — PROPOFOL 10 MG/ML IV BOLUS
INTRAVENOUS | Status: AC
  Filled 2017-10-19: qty 60

## 2017-10-19 MED ORDER — SODIUM CHLORIDE 0.9 % IJ SOLN
INTRAMUSCULAR | Status: AC
  Filled 2017-10-19: qty 10

## 2017-10-19 MED ORDER — EPHEDRINE SULFATE 50 MG/ML IJ SOLN
INTRAMUSCULAR | Status: AC
  Filled 2017-10-19: qty 1

## 2017-10-19 MED ORDER — FENTANYL CITRATE (PF) 100 MCG/2ML IJ SOLN
INTRAMUSCULAR | Status: AC
  Filled 2017-10-19: qty 2

## 2017-10-19 NOTE — Op Note (Signed)
[facility] Patient Name: [name] Procedure Date: 10/19/2017 8:37 AM MRN: [id number] Date of Birth: 1956/07/16 Attending MD: Norvel Richards , MD CSN: [id number] Age: 62 Admit Type: Outpatient Procedure:                Upper GI endoscopy Indications:              Dysphagia Providers:                Norvel Richards, MD, Charlsie Quest. Theda Sers RN, RN,                            Rosina Lowenstein, RN Referring MD:             Jasper Loser. Luan Pulling MD, MD Medicines:                Propofol per Anesthesia Complications:            No immediate complications. Estimated Blood Loss:     Estimated blood loss: none. Procedure:                Pre-Anesthesia Assessment:                           - Prior to the procedure, a History and Physical                            was performed, and patient medications and                            allergies were reviewed. The patient's tolerance of                            previous anesthesia was also reviewed. The risks                            and benefits of the procedure and the sedation                            options and risks were discussed with the patient.                            All questions were answered, and informed consent                            was obtained. Prior Anticoagulants: The patient has                            taken no previous anticoagulant or antiplatelet                            agents. ASA Grade Assessment: III - A patient with                            severe systemic disease. After reviewing the risks  and benefits, the patient was deemed in                            satisfactory condition to undergo the procedure.                           After obtaining informed consent, the endoscope was                            passed under direct vision. Throughout the                            procedure, the patient's blood pressure, pulse, and   oxygen saturations were monitored continuously. The                            EG-[id number]) scope was introduced through the                            and advanced to the second part of duodenum. The                            [id number] ([id number]) scope was introduced through the                            and advanced to the. The upper GI endoscopy was                            accomplished without difficulty. The patient                            tolerated the procedure well. Scope In: 8:53:18 AM Scope Out: 9:00:31 AM Total Procedure Duration: 0 hours 7 minutes 13 seconds  Findings:      Esophagitis was found. tiny erosions within 5 mm the GE junction. No       Barrett's epithelium seen. No tumor seen. no varices. Tubular esophagus       appeared patent throughout its course. .      Surgically altered consistent with prior bariatric surgery. Single       patent efferent small bowel limb - appeared normal. small hiatal hernia.       The scope was withdrawn. Dilation was performed with a Maloney dilator       with moderate resistance at 54 Fr. The scope was withdrawn. Dilation was       performed with a Maloney dilator with mild resistance at 56 Fr. The       dilation site was examined following endoscope reinsertion and showed no       change. Estimated blood loss: none Impression:               - Mild erosive reflux Esophagitis. Dilated.                           - Surgery altered stomach as described. Small  hiatal hernia..                           - No specimens collected. Moderate Sedation:      Moderate (conscious) sedation was personally administered by an       anesthesia professional. The following parameters were monitored: oxygen       saturation, heart rate, blood pressure, respiratory rate, EKG, adequacy       of pulmonary ventilation, and response to care. Total physician       intraservice time was 7 minutes. Recommendation:           -  Patient has a contact number available for                            emergencies. The signs and symptoms of potential                            delayed complications were discussed with the                            patient. Return to normal activities tomorrow.                            Written discharge instructions were provided to the                            patient.                           - Advance diet as tolerated. Continue present                            medications. See colonoscopy report. Repeat EGD in                            2 years for screening purposes.                           - Continue present medications. Procedure Code(s):        --- Professional ---                           [phone], Esophagogastroduodenoscopy, flexible,                            transoral; diagnostic, including collection of                            specimen(s) by brushing or washing, when performed                            (separate procedure)                           32951, Dilation of esophagus, by unguided sound or  bougie, single or multiple passes Diagnosis Code(s):        --- Professional ---                           K20.9, Esophagitis, unspecified                           R13.10, Dysphagia, unspecified CPT copyright 2016 American Medical Association. All rights reserved. The codes documented in this report are preliminary and upon coder review may  be revised to meet current compliance requirements. [name]. [name], MD Norvel Richards, MD 10/19/2017 9:36:05 AM This report has been signed electronically. Number of Addenda: 0

## 2017-10-19 NOTE — Interval H&P Note (Signed)
History and Physical Interval Note:  10/19/2017 8:39 AM  [name]  has presented today for surgery, with the diagnosis of cirrhosis, screening colonoscopy  The various methods of treatment have been discussed with the patient and family. After consideration of risks, benefits and other options for treatment, the patient has consented to  Procedure(s) with comments: COLONOSCOPY WITH PROPOFOL (N/A) - 9:15am ESOPHAGOGASTRODUODENOSCOPY (EGD) WITH PROPOFOL (N/A) MALONEY DILATION (N/A) as a surgical intervention .  The patient's history has been reviewed, patient examined, no change in status, stable for surgery.  I have reviewed the patient's chart and labs.  Questions were answered to the patient's satisfaction.     Robert Rourk  No change. EGD with possible esophageal dilation per plan.  The risks, benefits, limitations, imponderables and alternatives regarding both EGD and colonoscopy have been reviewed with the patient. Questions have been answered. All parties agreeable.

## 2017-10-19 NOTE — Op Note (Signed)
[facility] Patient Name: [name] Procedure Date: 10/19/2017 9:03 AM MRN: [id number] Date of Birth: 24-Jan-1956 Attending MD: Norvel Richards , MD CSN: [id number] Age: 62 Admit Type: Outpatient Procedure:                Colonoscopy Indications:              Screening for colorectal malignant neoplasm Providers:                Norvel Richards, MD, Selena Lesser RN, RN,                            Rosina Lowenstein, RN Referring MD:              Medicines:                Propofol per Anesthesia Complications:            No immediate complications. Estimated Blood Loss:     Estimated blood loss was minimal. Procedure:                Pre-Anesthesia Assessment:                           - Prior to the procedure, a History and Physical                            was performed, and patient medications and                            allergies were reviewed. The patient's tolerance of                            previous anesthesia was also reviewed. The risks                            and benefits of the procedure and the sedation                            options and risks were discussed with the patient.                            All questions were answered, and informed consent                            was obtained. Prior Anticoagulants: The patient has                            taken no previous anticoagulant or antiplatelet                            agents. ASA Grade Assessment: III - A patient with                            severe systemic disease. After reviewing the risks  and benefits, the patient was deemed in                            satisfactory condition to undergo the procedure.                           After obtaining informed consent, the colonoscope                            was passed under direct vision. Throughout the                            procedure, the patient's blood pressure, pulse, and                             oxygen saturations were monitored continuously. The                            EC38-[id number] ([id number]) scope was introduced through                            the and advanced to the. The ileocecal valve,                            appendiceal orifice, and rectum were photographed.                            The quality of the bowel preparation was adequate.                            The EC-[id number] ([id number]) scope was introduced                            through the and advanced to the the cecum,                            identified by appendiceal orifice and ileocecal                            valve. Scope In: 9:07:10 AM Scope Out: 9:31:02 AM Scope Withdrawal Time: 0 hours 9 minutes 2 seconds  Total Procedure Duration: 0 hours 23 minutes 52 seconds  Findings:      The perianal and digital rectal examinations were normal. redundant /       elongated colon. Changing of the patient's pos required to reach the       cecum      A 4 mm polyp was found in the sigmoid colon. The polyp was sessile. The       polyp was removed with a cold snare. Resection and retrieval were       complete. Estimated blood loss was minimal.      Scattered small-mouthed diverticula were found in the sigmoid colon and       descending colon.      The exam was otherwise without abnormality on direct and retroflexion  views. Impression:               - One 4 mm polyp in the sigmoid colon, removed with                            a cold snare. Resected and retrieved.                           - Diverticulosis in the sigmoid colon and in the                            descending colon.                           - The examination was otherwise normal on direct                            and retroflexion views. Redundant colon. Moderate Sedation:      Moderate (conscious) sedation was personally administered by an       anesthesia professional. The following parameters were monitored: oxygen       saturation,  heart rate, blood pressure, respiratory rate, EKG, adequacy       of pulmonary ventilation, and response to care. Total physician       intraservice time was 24 minutes. Recommendation:           - Patient has a contact number available for                            emergencies. The signs and symptoms of potential                            delayed complications were discussed with the                            patient. Return to normal activities tomorrow.                            Written discharge instructions were provided to the                            patient.                           - Resume previous diet.                           - Continue present medications.                           - Repeat colonoscopy date to be determined after                            pending pathology results are reviewed for                            surveillance based on pathology  results.                           - Return to GI clinic in 4 months. See EGD report. Procedure Code(s):        --- Professional ---                           [phone], Colonoscopy, flexible; with removal of                            tumor(s), polyp(s), or other lesion(s) by snare                            technique Diagnosis Code(s):        --- Professional ---                           Z12.11, Encounter for screening for malignant                            neoplasm of colon                           D12.5, Benign neoplasm of sigmoid colon                           K57.30, Diverticulosis of large intestine without                            perforation or abscess without bleeding CPT copyright 2016 American Medical Association. All rights reserved. The codes documented in this report are preliminary and upon coder review may  be revised to meet current compliance requirements. [name]. Bellany Elbaum, MD Norvel Richards, MD 10/19/2017 9:44:14 AM This report has been signed electronically. Number of Addenda: 0

## 2017-10-19 NOTE — Anesthesia Procedure Notes (Signed)
Procedure Name: MAC Date/Time: 10/19/2017 8:01 AM Performed by: Andree Elk Ellan Tess A, CRNA Pre-anesthesia Checklist: Patient identified, Emergency Drugs available, Suction available, Patient being monitored and Timeout performed Oxygen Delivery Method: Simple face mask

## 2017-10-19 NOTE — Anesthesia Preprocedure Evaluation (Signed)
Anesthesia Evaluation  Patient identified by MRN, date of birth, ID band Patient awake    Reviewed: Allergy & Precautions, H&P , NPO status , Patient's Chart, lab work & pertinent test results  History of Anesthesia Complications Negative for: history of anesthetic complications  Airway Mallampati: I  TM Distance: >3 FB Neck ROM: full    Dental  (+) Teeth Intact   Pulmonary neg pulmonary ROS, former smoker,    Pulmonary exam normal breath sounds clear to auscultation       Cardiovascular negative cardio ROS Normal cardiovascular exam Rhythm:regular Rate:Normal     Neuro/Psych Seizures -,  PSYCHIATRIC DISORDERS Depression Schizophrenia negative neurological ROS     GI/Hepatic PUD, GERD  ,(+) Cirrhosis  (Liver Mass)      ,   Endo/Other  negative endocrine ROS  Renal/GU negative Renal ROS     Musculoskeletal   Abdominal   Peds  Hematology negative hematology ROS (+)   Anesthesia Other Findings   Reproductive/Obstetrics negative OB ROS                             Anesthesia Physical Anesthesia Plan  ASA: III  Anesthesia Plan: MAC   Post-op Pain Management:    Induction: Intravenous  PONV Risk Score and Plan:   Airway Management Planned: Simple Face Mask  Additional Equipment:   Intra-op Plan:   Post-operative Plan:   Informed Consent: I have reviewed the patients History and Physical, chart, labs and discussed the procedure including the risks, benefits and alternatives for the proposed anesthesia with the patient or authorized representative who has indicated his/her understanding and acceptance.     Plan Discussed with:   Anesthesia Plan Comments:         Anesthesia Quick Evaluation

## 2017-10-19 NOTE — Transfer of Care (Signed)
Immediate Anesthesia Transfer of Care Note  Patient: [name]  Procedure(s) Performed: COLONOSCOPY WITH PROPOFOL (N/A ) ESOPHAGOGASTRODUODENOSCOPY (EGD) WITH PROPOFOL (N/A ) MALONEY DILATION (N/A ) POLYPECTOMY  Patient Location: PACU  Anesthesia Type:MAC  Level of Consciousness: awake, alert  and patient cooperative  Airway & Oxygen Therapy: Patient Spontanous Breathing and Patient connected to nasal cannula oxygen  Post-op Assessment: Report given to RN and Post -op Vital signs reviewed and stable  Post vital signs: Reviewed and stable  Last Vitals:  Vitals:   10/19/17 0840 10/19/17 0845  BP: 138/63   Pulse:    Resp: 11   Temp:    SpO2: 98% 99%    Last Pain:  Vitals:   10/19/17 0745  TempSrc: Oral         Complications: No apparent anesthesia complications

## 2017-10-19 NOTE — Discharge Instructions (Signed)
EGD Discharge instructions Please read the instructions outlined below and refer to this sheet in the next few weeks. These discharge instructions provide you with general information on caring for yourself after you leave the hospital. Your doctor may also give you specific instructions. While your treatment has been planned according to the most current medical practices available, unavoidable complications occasionally occur. If you have any problems or questions after discharge, please call your doctor. ACTIVITY  You may resume your regular activity but move at a slower pace for the next 24 hours.   Take frequent rest periods for the next 24 hours.   Walking will help expel (get rid of) the air and reduce the bloated feeling in your abdomen.   No driving for 24 hours (because of the anesthesia (medicine) used during the test).   You may shower.   Do not sign any important legal documents or operate any machinery for 24 hours (because of the anesthesia used during the test).  NUTRITION  Drink plenty of fluids.   You may resume your normal diet.   Begin with a light meal and progress to your normal diet.   Avoid alcoholic beverages for 24 hours or as instructed by your caregiver.  MEDICATIONS  You may resume your normal medications unless your caregiver tells you otherwise.  WHAT YOU CAN EXPECT TODAY  You may experience abdominal discomfort such as a feeling of fullness or gas pains.  FOLLOW-UP  Your doctor will discuss the results of your test with you.  SEEK IMMEDIATE MEDICAL ATTENTION IF ANY OF THE FOLLOWING OCCUR:  Excessive nausea (feeling sick to your stomach) and/or vomiting.   Severe abdominal pain and distention (swelling).   Trouble swallowing.   Temperature over 101 F (37.8 C).   Rectal bleeding or vomiting of blood.    Repeat EGD in 2 years  Colon polyp and diverticulosis information provided  Office visit with [place] in 4 months  Further  recommendations to follow pending review of pathology report

## 2017-10-19 NOTE — Anesthesia Postprocedure Evaluation (Signed)
Anesthesia Post Note  Patient: [name]  Procedure(s) Performed: COLONOSCOPY WITH PROPOFOL (N/A ) ESOPHAGOGASTRODUODENOSCOPY (EGD) WITH PROPOFOL (N/A ) MALONEY DILATION (N/A ) POLYPECTOMY  Patient location during evaluation: PACU Anesthesia Type: MAC Level of consciousness: awake and alert Pain management: satisfactory to patient Vital Signs Assessment: post-procedure vital signs reviewed and stable Respiratory status: spontaneous breathing Cardiovascular status: stable Anesthetic complications: no     Last Vitals:  Vitals:   10/19/17 0940 10/19/17 0945  BP: (!) 108/59 118/63  Pulse: (!) 59 61  Resp: 11 17  Temp: 36.5 C   SpO2:  96%    Last Pain:  Vitals:   10/19/17 0745  TempSrc: Oral                 Runa Whittingham

## 2017-10-23 ENCOUNTER — Encounter (HOSPITAL_COMMUNITY): Payer: Self-pay | Admitting: Internal Medicine

## 2017-10-24 ENCOUNTER — Encounter: Payer: Self-pay | Admitting: Internal Medicine

## 2017-11-09 DIAGNOSIS — Z0000 Encounter for general adult medical examination without abnormal findings: Secondary | ICD-10-CM | POA: Diagnosis not present

## 2017-11-20 ENCOUNTER — Encounter: Payer: Self-pay | Admitting: *Deleted

## 2017-11-23 ENCOUNTER — Telehealth: Payer: Self-pay | Admitting: *Deleted

## 2017-11-23 DIAGNOSIS — Z87891 Personal history of nicotine dependence: Secondary | ICD-10-CM

## 2017-11-23 DIAGNOSIS — Z122 Encounter for screening for malignant neoplasm of respiratory organs: Secondary | ICD-10-CM

## 2017-11-24 DIAGNOSIS — K210 Gastro-esophageal reflux disease with esophagitis: Secondary | ICD-10-CM | POA: Diagnosis not present

## 2017-11-24 DIAGNOSIS — F322 Major depressive disorder, single episode, severe without psychotic features: Secondary | ICD-10-CM | POA: Diagnosis not present

## 2017-11-24 DIAGNOSIS — F329 Major depressive disorder, single episode, unspecified: Secondary | ICD-10-CM | POA: Diagnosis not present

## 2017-11-24 DIAGNOSIS — E669 Obesity, unspecified: Secondary | ICD-10-CM | POA: Diagnosis not present

## 2017-11-24 DIAGNOSIS — Z0000 Encounter for general adult medical examination without abnormal findings: Secondary | ICD-10-CM | POA: Diagnosis not present

## 2017-11-24 DIAGNOSIS — K7460 Unspecified cirrhosis of liver: Secondary | ICD-10-CM | POA: Diagnosis not present

## 2017-11-24 DIAGNOSIS — I951 Orthostatic hypotension: Secondary | ICD-10-CM | POA: Diagnosis not present

## 2017-11-25 LAB — CBC WITH DIFFERENTIAL/PLATELET
Basophils Absolute: 0.1 10*3/uL (ref 0.0–0.2)
Basos: 1 %
EOS (ABSOLUTE): 0.2 10*3/uL (ref 0.0–0.4)
Eos: 3 %
Hematocrit: 41.5 % (ref 34.0–46.6)
Hemoglobin: 13.6 g/dL (ref 11.1–15.9)
Immature Grans (Abs): 0 10*3/uL (ref 0.0–0.1)
Immature Granulocytes: 0 %
Lymphocytes Absolute: 1.7 10*3/uL (ref 0.7–3.1)
Lymphs: 20 %
MCH: 30.3 pg (ref 26.6–33.0)
MCHC: 32.8 g/dL (ref 31.5–35.7)
MCV: 92 fL (ref 79–97)
Monocytes Absolute: 0.8 10*3/uL (ref 0.1–0.9)
Monocytes: 9 %
Neutrophils Absolute: 5.7 10*3/uL (ref 1.4–7.0)
Neutrophils: 67 %
Platelets: 272 10*3/uL (ref 150–379)
RBC: 4.49 x10E6/uL (ref 3.77–5.28)
RDW: 13.5 % (ref 12.3–15.4)
WBC: 8.4 10*3/uL (ref 3.4–10.8)

## 2017-11-25 LAB — COMPREHENSIVE METABOLIC PANEL
ALT: 21 IU/L (ref 0–32)
AST: 21 IU/L (ref 0–40)
Albumin/Globulin Ratio: 1.6 (ref 1.2–2.2)
Albumin: 4.3 g/dL (ref 3.6–4.8)
Alkaline Phosphatase: 69 IU/L (ref 39–117)
BUN/Creatinine Ratio: 15 (ref 12–28)
BUN: 17 mg/dL (ref 8–27)
Bilirubin Total: 0.4 mg/dL (ref 0.0–1.2)
CO2: 26 mmol/L (ref 20–29)
Calcium: 10 mg/dL (ref 8.7–10.3)
Chloride: 105 mmol/L (ref 96–106)
Creatinine, Ser: 1.17 mg/dL — ABNORMAL HIGH (ref 0.57–1.00)
GFR calc Af Amer: 58 mL/min/{1.73_m2} — ABNORMAL LOW (ref >59)
GFR calc non Af Amer: 50 mL/min/{1.73_m2} — ABNORMAL LOW (ref >59)
Globulin, Total: 2.7 g/dL (ref 1.5–4.5)
Glucose: 83 mg/dL (ref 65–99)
Potassium: 4.8 mmol/L (ref 3.5–5.2)
Sodium: 145 mmol/L — ABNORMAL HIGH (ref 134–144)
Total Protein: 7 g/dL (ref 6.0–8.5)

## 2017-11-25 LAB — PROTIME-INR
INR: 1 (ref 0.8–1.2)
Prothrombin Time: 10.7 s (ref 9.1–12.0)

## 2017-11-27 NOTE — Telephone Encounter (Signed)
Spoke with pt and scheduled SDMV 12/13/17 9:00 CT ordered Nothing further needed

## 2017-11-27 NOTE — Progress Notes (Signed)
MELD Na is 8. I am not sure why she had these repeated, as they were done in February. Creatinine has bumped just slightly and GFR has gone down slightly. Please copy to PCP as FYI for renal function.

## 2017-12-06 NOTE — Progress Notes (Signed)
CC'D TO PCP °

## 2017-12-07 ENCOUNTER — Other Ambulatory Visit (HOSPITAL_COMMUNITY): Payer: Self-pay | Admitting: Pulmonary Disease

## 2017-12-07 DIAGNOSIS — Z1231 Encounter for screening mammogram for malignant neoplasm of breast: Secondary | ICD-10-CM

## 2017-12-13 ENCOUNTER — Ambulatory Visit (INDEPENDENT_AMBULATORY_CARE_PROVIDER_SITE_OTHER)
Admission: RE | Admit: 2017-12-13 | Discharge: 2017-12-13 | Disposition: A | Discharge status: Home / Self Care | Payer: PPO | Source: Ambulatory Visit | Attending: Acute Care | Admitting: Acute Care

## 2017-12-13 ENCOUNTER — Ambulatory Visit (INDEPENDENT_AMBULATORY_CARE_PROVIDER_SITE_OTHER): Payer: PPO | Admitting: Acute Care

## 2017-12-13 ENCOUNTER — Encounter: Payer: Self-pay | Admitting: Acute Care

## 2017-12-13 DIAGNOSIS — Z87891 Personal history of nicotine dependence: Secondary | ICD-10-CM

## 2017-12-13 DIAGNOSIS — Z122 Encounter for screening for malignant neoplasm of respiratory organs: Secondary | ICD-10-CM

## 2017-12-13 NOTE — Progress Notes (Signed)
Shared Decision Making Visit Lung Cancer Screening Program [phone])   Eligibility:  Age 62 y.o.  Pack Years Smoking History Calculation 36 pack year smoking history (# packs/per year x # years smoked)  Recent History of coughing up blood  no  Unexplained weight loss? no ( >Than 15 pounds within the last 6 months )  Prior History Lung / other cancer no (Diagnosis within the last 5 years already requiring surveillance chest CT Scans).  Smoking Status Former Smoker  Former Smokers: Years since quit: < 1 year  Quit Date: 08/2017  Visit Components:  Discussion included one or more decision making aids. yes  Discussion included risk/benefits of screening. yes  Discussion included potential follow up diagnostic testing for abnormal scans. yes  Discussion included meaning and risk of over diagnosis. yes  Discussion included meaning and risk of False Positives. yes  Discussion included meaning of total radiation exposure. yes  Counseling Included:  Importance of adherence to annual lung cancer LDCT screening. yes  Impact of comorbidities on ability to participate in the program. yes  Ability and willingness to under diagnostic treatment. yes  Smoking Cessation Counseling:  Current Smokers:   Discussed importance of smoking cessation.NA, former smoker  Information about tobacco cessation classes and interventions provided to patient. yes  Patient provided with "ticket" for LDCT Scan. yes  Symptomatic Patient. no  Counseling  Diagnosis Code: Tobacco Use Z72.0  Asymptomatic Patient yes  Counseling (Intermediate counseling: > three minutes counseling) N0272  Former Smokers:   Discussed the importance of maintaining cigarette abstinence. yes  Diagnosis Code: Personal History of Nicotine Dependence. Z36.644  Information about tobacco cessation classes and interventions provided to patient. Yes  Patient provided with "ticket" for LDCT Scan. yes  Written Order  for Lung Cancer Screening with LDCT placed in Epic. Yes (CT Chest Lung Cancer Screening Low Dose W/O CM) [id number] Z12.2-Screening of respiratory organs Z87.891-Personal history of nicotine dependence  I spent 25 minutes of face to face time with [name] discussing the risks and benefits of lung cancer screening. We viewed a power point together that explained in detail the above noted topics. We took the time to pause the power point at intervals to allow for questions to be asked and answered to ensure understanding. We discussed that she had taken the single most powerful action possible to decrease her risk of developing lung cancer when she quit smoking. I counseled her to remain smoke free, and to contact me if she ever had the desire to smoke again so that I can provide resources and tools to help support the effort to remain smoke free. We discussed the time and location of the scan, and that either  Doroteo Glassman RN or I will call with the results within  24-48 hours of receiving them. She has my card and contact information in the event she needs to speak with me, in addition to a copy of the power point we reviewed as a resource. She verbalized understanding of all of the above and had no further questions upon leaving the office.     I explained to the patient that there has been a high incidence of coronary artery disease noted on these exams. I explained that this is a non-gated exam therefore degree or severity cannot be determined. This patient is not on statin therapy. I have asked the patient to follow-up with their PCP regarding any incidental finding of coronary artery disease and management with diet or medication as they feel  is clinically indicated. The patient verbalized understanding of the above and had no further questions.     Christine Spatz, NP 12/13/2017 9:32 AM

## 2017-12-14 ENCOUNTER — Ambulatory Visit (HOSPITAL_COMMUNITY)
Admission: RE | Admit: 2017-12-14 | Discharge: 2017-12-14 | Disposition: A | Discharge status: Home / Self Care | Payer: PPO | Source: Ambulatory Visit | Attending: Pulmonary Disease | Admitting: Pulmonary Disease

## 2017-12-14 ENCOUNTER — Encounter (HOSPITAL_COMMUNITY): Payer: Self-pay

## 2017-12-14 DIAGNOSIS — Z1231 Encounter for screening mammogram for malignant neoplasm of breast: Secondary | ICD-10-CM

## 2017-12-20 ENCOUNTER — Other Ambulatory Visit: Payer: Self-pay | Admitting: Acute Care

## 2017-12-20 DIAGNOSIS — Z122 Encounter for screening for malignant neoplasm of respiratory organs: Secondary | ICD-10-CM

## 2017-12-20 DIAGNOSIS — Z87891 Personal history of nicotine dependence: Secondary | ICD-10-CM

## 2018-02-08 DIAGNOSIS — F250 Schizoaffective disorder, bipolar type: Secondary | ICD-10-CM | POA: Diagnosis not present

## 2018-02-26 ENCOUNTER — Ambulatory Visit (INDEPENDENT_AMBULATORY_CARE_PROVIDER_SITE_OTHER): Payer: PPO | Admitting: Otolaryngology

## 2018-02-26 DIAGNOSIS — R490 Dysphonia: Secondary | ICD-10-CM

## 2018-02-26 DIAGNOSIS — R070 Pain in throat: Secondary | ICD-10-CM

## 2018-02-26 DIAGNOSIS — K219 Gastro-esophageal reflux disease without esophagitis: Secondary | ICD-10-CM | POA: Diagnosis not present

## 2018-03-20 ENCOUNTER — Ambulatory Visit: Payer: Medicare Other | Admitting: Gastroenterology

## 2018-03-20 ENCOUNTER — Encounter: Payer: Self-pay | Admitting: Gastroenterology

## 2018-04-03 ENCOUNTER — Ambulatory Visit: Payer: PPO | Admitting: Nurse Practitioner

## 2018-04-03 ENCOUNTER — Encounter: Payer: Self-pay | Admitting: Nurse Practitioner

## 2018-04-03 VITALS — BP 130/72 | HR 63 | Temp 97.9°F | Ht 64.0 in | Wt 204.6 lb

## 2018-04-03 DIAGNOSIS — K219 Gastro-esophageal reflux disease without esophagitis: Secondary | ICD-10-CM

## 2018-04-03 DIAGNOSIS — K5900 Constipation, unspecified: Secondary | ICD-10-CM | POA: Diagnosis not present

## 2018-04-03 DIAGNOSIS — K7460 Unspecified cirrhosis of liver: Secondary | ICD-10-CM | POA: Diagnosis not present

## 2018-04-03 NOTE — Progress Notes (Signed)
CC'ED TO PCP 

## 2018-04-03 NOTE — Assessment & Plan Note (Signed)
Historically well compensated cirrhosis with her last meld score 8, child Pugh class A.  She has had a mild bump in her creatinine to 1.17 her last labs, although she has been around 1.10-1.11 since 2017.  She has a follow-up with her primary care in September.  I recommended discussing this with her primary care.  Currently she is due for updated labs and imaging.  We will check CBC, CMP, INR, AFP as well as right upper quadrant ultrasound for hepatoma screening.  Return for follow-up in 6 months.  Call if any worsening problems before then.

## 2018-04-03 NOTE — Progress Notes (Signed)
Referring Provider: Sinda Du, MD Primary Care Physician:  Sinda Du, MD Primary GI:  Dr. Gala Romney  Chief Complaint  Patient presents with  . Follow-up    Doing well    HPI:   [name] is a 62 y.o. female who presents for follow-up on cirrhosis.  The patient was last seen in our office 09/20/2017 for cirrhosis, GERD, screening colonoscopy, constipation.  Her liver disease has been historically well controlled.  Cirrhosis is deemed likely secondary to [place].  She is due for EGD in 2019.  She is also noted due for routine screening colonoscopy.  Abdominal imaging up-to-date June 2019.  History of peripancreatic mass posterior to the distal stomach did not follow-up with Dr. Barry Dienes most consistent with lymph node.  Recent CT on file related to this.  Taking MiraLAX for constipation as her Linzess was too expensive and states it works okay.  She is wondering how much Amitiza would be.  She is on Protonix daily for GERD but no dysphasia.  Recommended colonoscopy, upper endoscopy with possible dilation.  Repeat ultrasound in 6 months, blood work.  Samples of Amitiza to see if this helps any better.  Progress report relating to Amitiza effectiveness.  Colonoscopy completed 10/19/2017 which found a single 4 mm polyp in the sigmoid colon which was sessile, scattered small mouth diverticula in the sigmoid and descending colon, otherwise normal.  Surgical pathology found the polyp to be hyperplastic.  Recommended 10-year repeat colonoscopy.  EGD completed the same day found erosive reflux esophagitis status post dilation, surgically altered stomach status post gastric bypass with a single patent apparent small bowel limb, small hiatal hernia.  Commended repeat EGD in 2 years for screening.  Labs completed 10/13/2017 including CBC, CMP, PT/INR.  CBC was normal, her creatinine was within baseline at 1.10, LFTs normal, INR 1.04.  Meld score calculated at 7, Child-Pugh A.  Today she states  she's doing ok overall. ;She knows her Cr is mildly elevated and sees her PCP in September. Denies abdominal pain, N/V, hematochezia, melena, fever, chills, unintentional weight loss. She has gained weight (she recently quit smoking). Denies yellowing of skin/eyes, darkened urine, tremors/shakes, acute episodic confusion. Denies chest pain, dyspnea, dizziness, lightheadedness, syncope, near syncope. Denies any other upper or lower GI symptoms.  She tried the Amitiza but is now having regular bowel movements without it and so she stopped taking it. GERD well managed on PPI.  Past Medical History:  Diagnosis Date  . Chronic constipation   . Cirrhosis ([place])    child pugh class A and MELD 5, completed hep A and B vaccine series, no HCC on 3/16 MRI  . Colonic inertia   . Depression   . Diverticula of colon   . Diverticulosis 01/31/2008   colonoscopy Dr Gala Romney  . GERD (gastroesophageal reflux disease)   . Hyperplastic colon polyp   . Liver mass   . Obesity   . PUD (peptic ulcer disease) 1988  . Schizoaffective disorder    Mental Health-Dr Elias Else  . Seizures ([place])    as child; no medsd and has had no seizures since then.  . Wrist fracture    Left wrist s/p fall and surgical repair    Past Surgical History:  Procedure Laterality Date  . ABDOMINAL HYSTERECTOMY    . CHOLECYSTECTOMY  08/2010   cholelithiasis; Dr Arnoldo Morale  . COLONOSCOPY  01/31/2008   Dr. Gala Romney- normal rectum, L sided diverticula, long tortuous colon. hyperplasitic  polyp.  . COLONOSCOPY  WITH PROPOFOL N/A 10/19/2017   Procedure: COLONOSCOPY WITH PROPOFOL;  Surgeon: Daneil Dolin, MD;  [facility];  Service: Endoscopy;  Laterality: N/A;  9:15am  . ERCP W/ SPHINCTEROTOMY AND BALLOON DILATION  08/05/2010   normal ampulla/mild erythema in the gastric remnant without ulcerations or erosions. normal retroflexed view of the cardia/anastomosis normal/distal common bile duct stones s/p extraction and shincterotomy  .  ESOPHAGOGASTRODUODENOSCOPY N/A 10/24/2013   TML:[id number] esophagus. Surgically altered stomach. Abnormal gastric mucosa  -  status post biopsy (reactive gastrophathy)  . ESOPHAGOGASTRODUODENOSCOPY (EGD) WITH PROPOFOL N/A 11/30/2015   LA grade A esophagitis, no varices, surveillance in 2019  . ESOPHAGOGASTRODUODENOSCOPY (EGD) WITH PROPOFOL N/A 10/19/2017   Procedure: ESOPHAGOGASTRODUODENOSCOPY (EGD) WITH PROPOFOL;  Surgeon: Daneil Dolin, MD;  [facility];  Service: Endoscopy;  Laterality: N/A;  . EUS N/A 08/20/2015   Dr. Ardis Hughs: well-circumscribed 5.2 cm chronic mass in porta hepatis, internally mass is gelatinous. Small distal esophageal varices (limited view), Bilroth 1 type anatomy, cytology NEGATIVE for malignant cells. Suspicion for neoplasm low   . FOOT SURGERY     left  . GASTRIC BYPASS     with Revision, Dr. Duffy Rhody  . MALONEY DILATION N/A 10/19/2017   Procedure: Venia Minks DILATION;  Surgeon: Daneil Dolin, MD;  [facility];  Service: Endoscopy;  Laterality: N/A;  . POLYPECTOMY  10/19/2017   Procedure: POLYPECTOMY;  Surgeon: Daneil Dolin, MD;  [facility];  Service: Endoscopy;;  sigmoid colon (CS)   . REDUCTION MAMMAPLASTY Bilateral   . S/P Hysterectomy     partial  . TONSILLECTOMY    . WRIST SURGERY Left 05/2016    Current Outpatient Medications  Medication Sig Dispense Refill  . Calcium Carbonate-Vitamin D (CALCIUM 600/VITAMIN D PO) Take 2 tablets by mouth daily.    Marland Kitchen FLUoxetine (PROZAC) 40 MG capsule Take 40 mg by mouth daily.     . pantoprazole (PROTONIX) 40 MG tablet Take 1 tablet (40 mg total) by mouth daily. 30 tablet 12  . risperiDONE (RISPERDAL) 3 MG tablet Take 3 mg by mouth at bedtime.     . traZODone (DESYREL) 150 MG tablet Take 300 mg by mouth at bedtime.     No current facility-administered medications for this visit.     Allergies as of 04/03/2018 - Review Complete 04/03/2018  Allergen Reaction Noted  . Ciprofloxacin  Rash   . Kiwi extract Other (See Comments) 11/25/2015    Family History  Problem Relation Age of Onset  . Diabetes Father   . Colon cancer Maternal Uncle   . Liver disease Neg Hx     Social History   Socioeconomic History  . Marital status: Divorced    Spouse name: Not on file  . Number of children: 2  . Years of education: Not on file  . Highest education level: Not on file  Occupational History  . Occupation: disabled  Social Needs  . Financial resource strain: Not on file  . Food insecurity:    Worry: Not on file    Inability: Not on file  . Transportation needs:    Medical: Not on file    Non-medical: Not on file  Tobacco Use  . Smoking status: Former Smoker    Packs/day: 1.00    Years: 36.00    Pack years: 36.00    Types: Cigarettes    Last attempt to quit: 08/15/2017    Years since quitting: 0.6  . Smokeless tobacco: Never Used  .  Tobacco comment: stopped smoking 08/15/17  Substance and Sexual Activity  . Alcohol use: No    Alcohol/week: 0.0 oz  . Drug use: Yes    Types: Marijuana    Comment: last used 04/02/18.  Marland Kitchen Sexual activity: Never  Lifestyle  . Physical activity:    Days per week: Not on file    Minutes per session: Not on file  . Stress: Not on file  Relationships  . Social connections:    Talks on phone: Not on file    Gets together: Not on file    Attends religious service: Not on file    Active member of club or organization: Not on file    Attends meetings of clubs or organizations: Not on file    Relationship status: Not on file  Other Topics Concern  . Not on file  Social History Narrative   Lives w/ mother   2 grown children    Review of Systems: General: Negative for anorexia, weight loss, fever, chills, fatigue, weakness. ENT: Negative for hoarseness, difficulty swallowing , nasal congestion. CV: Negative for chest pain, angina, palpitations, dyspnea on exertion, peripheral edema.  Respiratory: Negative for dyspnea at rest,  dyspnea on exertion, cough, sputum, wheezing.  GI: See history of present illness. Neuro: Negative for memory loss, confusion.  Endo: Negative for unusual weight change.  Heme: Negative for bruising or bleeding. Allergy: Negative for rash or hives.   Physical Exam: BP 130/72   Pulse 63   Temp 97.9 F (36.6 C) (Oral)   Ht 5\' 4"  (1.626 m)   Wt 204 lb 9.6 oz (92.8 kg)   BMI 35.12 kg/m  General:   Alert and oriented. Pleasant and cooperative. Well-nourished and well-developed.  Eyes:  Without icterus, sclera clear and conjunctiva pink.  Ears:  Normal auditory acuity. Cardiovascular:  S1, S2 present without murmurs appreciated. Extremities without clubbing or edema. Respiratory:  Clear to auscultation bilaterally. No wheezes, rales, or rhonchi. No distress.  Gastrointestinal:  +BS, soft, non-tender and non-distended. No HSM noted. No guarding or rebound. No masses appreciated.  Rectal:  Deferred  Musculoskalatal:  Symmetrical without gross deformities. Neurologic:  Alert and oriented x4;  grossly normal neurologically. Psych:  Alert and cooperative. Normal mood and affect. Heme/Lymph/Immune: No excessive bruising noted.    04/03/2018 9:55 AM   Disclaimer: This note was dictated with voice recognition software. Similar sounding words can inadvertently be transcribed and may not be corrected upon review.

## 2018-04-03 NOTE — Assessment & Plan Note (Signed)
Constipation currently doing well without medication.  However, we can keep records at [facility] both work well for her.  If she has recurrent constipation either of these would be good options.  I discussed that she could restart these if needed.  Return for follow-up in 6 months.

## 2018-04-03 NOTE — Assessment & Plan Note (Signed)
GERD currently well managed on PPI.  She is no longer taking Zantac.  I recommended she continue her PPI, follow-up in 6 months as needed.

## 2018-04-03 NOTE — Patient Instructions (Signed)
1. As we discussed, continue taking your medications. 2. We will check your routine liver labs and update your liver imaging as well. 3. We will call you with results. 4. Return for follow-up in 6 months. 5. Call if you have any questions or concerns.  At [facility] we value your feedback. You may receive a survey about your visit today. Please share your experience as we strive to create trusting relationships with our patients to provide genuine, compassionate, quality care.  It was great to meet you today!  I hope you have a great summer!!

## 2018-04-09 ENCOUNTER — Ambulatory Visit (HOSPITAL_COMMUNITY)
Admission: RE | Admit: 2018-04-09 | Discharge: 2018-04-09 | Disposition: A | Discharge status: Home / Self Care | Payer: PPO | Source: Ambulatory Visit | Attending: Nurse Practitioner | Admitting: Nurse Practitioner

## 2018-04-09 DIAGNOSIS — K7460 Unspecified cirrhosis of liver: Secondary | ICD-10-CM | POA: Diagnosis not present

## 2018-04-09 DIAGNOSIS — K219 Gastro-esophageal reflux disease without esophagitis: Secondary | ICD-10-CM | POA: Insufficient documentation

## 2018-04-09 DIAGNOSIS — K5900 Constipation, unspecified: Secondary | ICD-10-CM

## 2018-04-09 DIAGNOSIS — Z9049 Acquired absence of other specified parts of digestive tract: Secondary | ICD-10-CM | POA: Diagnosis not present

## 2018-04-24 NOTE — Progress Notes (Signed)
Results letter mailed to pt

## 2018-05-11 DIAGNOSIS — E669 Obesity, unspecified: Secondary | ICD-10-CM | POA: Diagnosis not present

## 2018-05-11 DIAGNOSIS — R109 Unspecified abdominal pain: Secondary | ICD-10-CM | POA: Diagnosis not present

## 2018-05-11 DIAGNOSIS — K210 Gastro-esophageal reflux disease with esophagitis: Secondary | ICD-10-CM | POA: Diagnosis not present

## 2018-05-11 DIAGNOSIS — J449 Chronic obstructive pulmonary disease, unspecified: Secondary | ICD-10-CM | POA: Diagnosis not present

## 2018-05-11 DIAGNOSIS — Z23 Encounter for immunization: Secondary | ICD-10-CM | POA: Diagnosis not present

## 2018-07-18 DIAGNOSIS — Z0000 Encounter for general adult medical examination without abnormal findings: Secondary | ICD-10-CM | POA: Diagnosis not present

## 2018-07-25 DIAGNOSIS — F250 Schizoaffective disorder, bipolar type: Secondary | ICD-10-CM | POA: Diagnosis not present

## 2018-08-20 ENCOUNTER — Ambulatory Visit (INDEPENDENT_AMBULATORY_CARE_PROVIDER_SITE_OTHER): Payer: PPO | Admitting: Otolaryngology

## 2018-10-04 ENCOUNTER — Ambulatory Visit (INDEPENDENT_AMBULATORY_CARE_PROVIDER_SITE_OTHER): Payer: PPO | Admitting: Nurse Practitioner

## 2018-10-04 ENCOUNTER — Encounter: Payer: Self-pay | Admitting: Nurse Practitioner

## 2018-10-04 ENCOUNTER — Encounter: Payer: Self-pay | Admitting: *Deleted

## 2018-10-04 VITALS — BP 125/73 | HR 73 | Temp 98.2°F | Ht 64.0 in | Wt 218.8 lb

## 2018-10-04 DIAGNOSIS — K7460 Unspecified cirrhosis of liver: Secondary | ICD-10-CM | POA: Diagnosis not present

## 2018-10-04 DIAGNOSIS — K219 Gastro-esophageal reflux disease without esophagitis: Secondary | ICD-10-CM

## 2018-10-04 MED ORDER — RANITIDINE HCL 150 MG PO TABS: 150.0000 mg | ORAL_TABLET | Freq: Every day | ORAL | 3 refills | Status: DC

## 2018-10-04 NOTE — Patient Instructions (Signed)
Your health issues we discussed today were:   Heartburn (GERD) and hoarseness: 1. I will send in another prescription for ranitidine which previously helped her symptoms 2. Try to quit smoking as you are able to. 3. Follow-up in 6 months. 4. Call us if you have any worsening symptoms.  Cirrhosis: 1. As we discussed, overall your liver function looks like it is doing great 2. We will schedule routine labs and an ultrasound of your liver that we do every 6 months to check for changes  Overall I recommend:  1. Return for follow-up in 6 months. 2. Call us if you have any questions or concerns.  At [facility] we value your feedback. You may receive a survey about your visit today. Please share your experience as we strive to create trusting relationships with our patients to provide genuine, compassionate, quality care.  We appreciate your understanding and patience as we review any laboratory studies, imaging, and other diagnostic tests that are ordered as we care for you. Our office policy is 5 business days for review of these results, and any emergent or urgent results are addressed in a timely manner for your best interest. If you do not hear from our office in 1 week, please contact us.   We also encourage the use of MyChart, which contains your medical information for your review as well. If you are not enrolled in this feature, an access code is on this after visit summary for your convenience. Thank you for allowing [place] to be involved in your care.  It was great to see you today!  I hope you have a great day!!

## 2018-10-04 NOTE — Assessment & Plan Note (Signed)
The patient is generally asymptomatic from a hepatic standpoint.  Diagnosed cirrhosis in 2015.  She forgot to have her labs drawn at last interval.  However, historically her liver disease has been well compensated with a meld score pretty stable at 8.  She denies any overt symptoms today.  Due for routine labs and imaging.  No concerning findings on previous imaging studies related to cirrhosis.  Return for follow-up in 6 months.

## 2018-10-04 NOTE — Progress Notes (Signed)
Referring Provider: Sinda Du, MD Primary Care Physician:  Sinda Du, MD Primary GI:  Dr. Gala Romney  Chief Complaint  Patient presents with  . Gastroesophageal Reflux    HPI:   [name] is a 63 y.o. female who presents for follow-up on cirrhosis and GERD.  The patient was last seen in our office 04/03/2018 for the same as well as constipation.  History of cirrhosis and her disease has historically been well controlled.  Her cirrhosis is likely secondary to [place].  Noted to be due for routine screening colonoscopy and EGD in 2019.  History of peripancreatic mass posterior to the distal stomach and did not follow-up with Dr. Barry Dienes, though felt most consistent with a lymph node.  Recent CT on file related to this.  History of GERD and constipation.  Colonoscopy completed 10/19/2017 which found a single 4 mm polyp in the sigmoid colon which was sessile, scattered small mouth diverticula in the sigmoid and descending colon, otherwise normal.  Surgical pathology found the polyp to be hyperplastic.  Recommended 10-year repeat colonoscopy.  EGD completed the same day found erosive reflux esophagitis status post dilation, surgically altered stomach status post gastric bypass with a single patent apparent small bowel limb, small hiatal hernia.  Commended repeat EGD in 2 years for screening.  At her last visit she was doing well overall.  She was aware of a creatinine mild elevation and was scheduled to see primary care for this.  Recently quit smoking and gained some weight subsequently.  No hepatic symptoms.  GERD was well managed at that time on PPI.  Amitiza was effective but her bowel movements return to normal and so she stopped taking it.  Recommended to continue current medications, routine labs and liver imaging, follow-up in 6 months.  It does not appear her labs were drawn as recommended.  Previous labs in Mar of 2019 found MELD 8.  Right upper quadrant ultrasound was completed  04/09/2018 which found no focal liver lesion, within normal limits and parenchymal echogenicity.  Status post cholecystectomy.  Review of previous visits and imaging studies indicate when diagnosis of cirrhosis was made found a CT of the abdomen and pelvis dated 08/12/2013 noting liver with morphologic changes consistent with cirrhosis with some central volume loss, surface nodularity anteriorly and enlargement of the caudate lobe.  Similar to previous CT.  Based on Hounsfield units, doubt significant fatty infiltration.  Today she states she's doing ok overall. She has been taking Ranitidine which helped while she was smoking; she quit smoking and stopped Ranitidine. However, she has since started smopking again and is having issues including voice hoarseness, cotton-mouth. No esophageal burning. Symptoms nearly identical to what she had before, which was helped with Ranitidine. Denies abdominal pain, N/V, hematochezia, melena, fever, chills, unintentional weight loss. Denies yellowing of skin/eyes, darkened urine, generalized pruritis, tremors, acute episodic confusion.   Past Medical History:  Diagnosis Date  . Chronic constipation   . Cirrhosis ([place])    child pugh class A and MELD 5, completed hep A and B vaccine series, no HCC on 3/16 MRI  . Colonic inertia   . Depression   . Diverticula of colon   . Diverticulosis 01/31/2008   colonoscopy Dr Gala Romney  . GERD (gastroesophageal reflux disease)   . Hyperplastic colon polyp   . Liver mass   . Macular degeneration   . Obesity   . PUD (peptic ulcer disease) 1988  . Schizoaffective disorder    Mental Health-Dr  Dekle  . Seizures ([place])    as child; no medsd and has had no seizures since then.  . Wrist fracture    Left wrist s/p fall and surgical repair    Past Surgical History:  Procedure Laterality Date  . ABDOMINAL HYSTERECTOMY    . CHOLECYSTECTOMY  08/2010   cholelithiasis; Dr Arnoldo Morale  . COLONOSCOPY  01/31/2008   Dr. Gala Romney- normal rectum,  L sided diverticula, long tortuous colon. hyperplasitic  polyp.  . COLONOSCOPY WITH PROPOFOL N/A 10/19/2017   Procedure: COLONOSCOPY WITH PROPOFOL;  Surgeon: Daneil Dolin, MD;  [facility];  Service: Endoscopy;  Laterality: N/A;  9:15am  . ERCP W/ SPHINCTEROTOMY AND BALLOON DILATION  08/05/2010   normal ampulla/mild erythema in the gastric remnant without ulcerations or erosions. normal retroflexed view of the cardia/anastomosis normal/distal common bile duct stones s/p extraction and shincterotomy  . ESOPHAGOGASTRODUODENOSCOPY N/A 10/24/2013   [id number] esophagus. Surgically altered stomach. Abnormal gastric mucosa  -  status post biopsy (reactive gastrophathy)  . ESOPHAGOGASTRODUODENOSCOPY (EGD) WITH PROPOFOL N/A 11/30/2015   LA grade A esophagitis, no varices, surveillance in 2019  . ESOPHAGOGASTRODUODENOSCOPY (EGD) WITH PROPOFOL N/A 10/19/2017   Procedure: ESOPHAGOGASTRODUODENOSCOPY (EGD) WITH PROPOFOL;  Surgeon: Daneil Dolin, MD;  [facility];  Service: Endoscopy;  Laterality: N/A;  . EUS N/A 08/20/2015   Dr. Ardis Hughs: well-circumscribed 5.2 cm chronic mass in porta hepatis, internally mass is gelatinous. Small distal esophageal varices (limited view), Bilroth 1 type anatomy, cytology NEGATIVE for malignant cells. Suspicion for neoplasm low   . FOOT SURGERY     left  . GASTRIC BYPASS     with Revision, Dr. Duffy Rhody  . MALONEY DILATION N/A 10/19/2017   Procedure: Venia Minks DILATION;  Surgeon: Daneil Dolin, MD;  [facility];  Service: Endoscopy;  Laterality: N/A;  . POLYPECTOMY  10/19/2017   Procedure: POLYPECTOMY;  Surgeon: Daneil Dolin, MD;  [facility];  Service: Endoscopy;;  sigmoid colon (CS)   . REDUCTION MAMMAPLASTY Bilateral   . S/P Hysterectomy     partial  . TONSILLECTOMY    . WRIST SURGERY Left 05/2016    Current Outpatient Medications  Medication Sig Dispense Refill  . FLUoxetine (PROZAC) 40 MG capsule Take 40 mg by  mouth daily.     . Multiple Vitamins-Minerals (PRESERVISION AREDS PO) Take 1 tablet by mouth 2 (two) times daily.    . pantoprazole (PROTONIX) 40 MG tablet Take 1 tablet (40 mg total) by mouth daily. 30 tablet 12  . risperiDONE (RISPERDAL) 3 MG tablet Take 3 mg by mouth at bedtime.     . traZODone (DESYREL) 150 MG tablet Take 300 mg by mouth at bedtime.     No current facility-administered medications for this visit.     Allergies as of 10/04/2018 - Review Complete 10/04/2018  Allergen Reaction Noted  . Ciprofloxacin Rash   . Kiwi extract Other (See Comments) 11/25/2015    Family History  Problem Relation Age of Onset  . Diabetes Father   . Colon cancer Maternal Uncle   . Liver cancer Maternal Uncle        48 years old  . Liver disease Neg Hx     Social History   Socioeconomic History  . Marital status: Divorced    Spouse name: Not on file  . Number of children: 2  . Years of education: Not on file  . Highest education level: Not on file  Occupational History  . Occupation:  disabled  Social Needs  . Financial resource strain: Not on file  . Food insecurity:    Worry: Not on file    Inability: Not on file  . Transportation needs:    Medical: Not on file    Non-medical: Not on file  Tobacco Use  . Smoking status: Current Every Day Smoker    Packs/day: 1.00    Years: 36.00    Pack years: 36.00    Types: Cigarettes    Last attempt to quit: 08/15/2017    Years since quitting: 1.1  . Smokeless tobacco: Never Used  Substance and Sexual Activity  . Alcohol use: No    Alcohol/week: 0.0 standard drinks  . Drug use: Not Currently    Types: Marijuana    Comment: denied 10/04/18  . Sexual activity: Never  Lifestyle  . Physical activity:    Days per week: Not on file    Minutes per session: Not on file  . Stress: Not on file  Relationships  . Social connections:    Talks on phone: Not on file    Gets together: Not on file    Attends religious service: Not on file      Active member of club or organization: Not on file    Attends meetings of clubs or organizations: Not on file    Relationship status: Not on file  Other Topics Concern  . Not on file  Social History Narrative   Lives w/ mother   2 grown children    Review of Systems: General: Negative for anorexia, weight loss, fever, chills, fatigue, weakness. ENT: Negative for difficulty swallowing. Admits hoarseness. CV: Negative for chest pain, angina, palpitations, peripheral edema.  Respiratory: Negative for dyspnea at rest, cough, sputum, wheezing.  GI: See history of present illness. Derm: Negative for rash or itching.  Neuro: Negative for memory loss, confusion.  Endo: Negative for unusual weight change.  Heme: Negative for bruising or bleeding. Allergy: Negative for rash or hives.   Physical Exam: BP 125/73   Pulse 73   Temp 98.2 F (36.8 C) (Oral)   Ht 5\' 4"  (1.626 m)   Wt 218 lb 12.8 oz (99.2 kg)   BMI 37.56 kg/m  General:   Alert and oriented. Pleasant and cooperative. Well-nourished and well-developed.  Eyes:  Without icterus, sclera clear and conjunctiva pink.  Ears:  Normal auditory acuity. Cardiovascular:  S1, S2 present without murmurs appreciated. Extremities without clubbing or edema. Respiratory:  Clear to auscultation bilaterally. No wheezes, rales, or rhonchi. No distress.  Gastrointestinal:  +BS, soft, non-tender and non-distended. No HSM noted. No guarding or rebound. No masses appreciated.  Rectal:  Deferred  Musculoskalatal:  Symmetrical without gross deformities. Neurologic:  Alert and oriented x4;  grossly normal neurologically. Psych:  Alert and cooperative. Normal mood and affect. Heme/Lymph/Immune: No excessive bruising noted.    10/04/2018 11:02 AM   Disclaimer: This note was dictated with voice recognition software. Similar sounding words can inadvertently be transcribed and may not be corrected upon review.

## 2018-10-04 NOTE — Assessment & Plan Note (Signed)
Historically her GERD is been well controlled.  She was previously on ranitidine which she was able to come off when she quit smoking.  However, she has since restarted smoking and is having hoarse voice and other symptoms as per HPI.  These were near identical to what she was having prior to ranitidine and the ranitidine helped her symptoms quite well.  We will restart this today to see if it helps.  Follow-up in 6 months and call with any worsening symptoms.

## 2018-10-08 ENCOUNTER — Ambulatory Visit (HOSPITAL_COMMUNITY)
Admission: RE | Admit: 2018-10-08 | Discharge: 2018-10-08 | Disposition: A | Discharge status: Home / Self Care | Payer: PPO | Source: Ambulatory Visit | Attending: Nurse Practitioner | Admitting: Nurse Practitioner

## 2018-10-08 DIAGNOSIS — K7460 Unspecified cirrhosis of liver: Secondary | ICD-10-CM | POA: Insufficient documentation

## 2018-10-08 DIAGNOSIS — K219 Gastro-esophageal reflux disease without esophagitis: Secondary | ICD-10-CM | POA: Insufficient documentation

## 2018-10-08 DIAGNOSIS — K760 Fatty (change of) liver, not elsewhere classified: Secondary | ICD-10-CM | POA: Diagnosis not present

## 2018-10-08 NOTE — Progress Notes (Signed)
CC'D TO PCP °

## 2018-10-22 DIAGNOSIS — F17200 Nicotine dependence, unspecified, uncomplicated: Secondary | ICD-10-CM | POA: Diagnosis not present

## 2018-10-22 DIAGNOSIS — E669 Obesity, unspecified: Secondary | ICD-10-CM | POA: Diagnosis not present

## 2018-10-22 DIAGNOSIS — J441 Chronic obstructive pulmonary disease with (acute) exacerbation: Secondary | ICD-10-CM | POA: Diagnosis not present

## 2018-10-22 DIAGNOSIS — K210 Gastro-esophageal reflux disease with esophagitis: Secondary | ICD-10-CM | POA: Diagnosis not present

## 2019-02-04 ENCOUNTER — Other Ambulatory Visit (HOSPITAL_COMMUNITY): Payer: Self-pay | Admitting: Pulmonary Disease

## 2019-02-04 DIAGNOSIS — Z1231 Encounter for screening mammogram for malignant neoplasm of breast: Secondary | ICD-10-CM

## 2019-03-14 ENCOUNTER — Telehealth: Payer: Self-pay

## 2019-03-14 DIAGNOSIS — K219 Gastro-esophageal reflux disease without esophagitis: Secondary | ICD-10-CM

## 2019-03-14 MED ORDER — FAMOTIDINE 20 MG PO TABS: 20.0000 mg | ORAL_TABLET | Freq: Every day | ORAL | 5 refills | Status: DC

## 2019-03-14 NOTE — Telephone Encounter (Signed)
Rx for Pepcid was sent to the pharmacy: 20 mg daily at bedtime.

## 2019-03-14 NOTE — Telephone Encounter (Signed)
Noted!  Tried to call pt and phone not working.

## 2019-03-14 NOTE — Telephone Encounter (Signed)
Received a request from [facility] for an alternative to Ranitidine since the drug has been recalled.  Forwarding to Walden Field, NP to advise!

## 2019-04-03 ENCOUNTER — Telehealth: Payer: Self-pay | Admitting: Internal Medicine

## 2019-04-03 ENCOUNTER — Ambulatory Visit: Payer: PPO | Admitting: Nurse Practitioner

## 2019-04-03 ENCOUNTER — Encounter: Payer: Self-pay | Admitting: Internal Medicine

## 2019-04-03 NOTE — Telephone Encounter (Signed)
Patient was a no show and letter sent  °

## 2019-05-21 DIAGNOSIS — F250 Schizoaffective disorder, bipolar type: Secondary | ICD-10-CM | POA: Diagnosis not present

## 2019-06-10 DIAGNOSIS — Z23 Encounter for immunization: Secondary | ICD-10-CM | POA: Diagnosis not present

## 2019-09-27 ENCOUNTER — Other Ambulatory Visit: Payer: Self-pay

## 2019-09-27 ENCOUNTER — Ambulatory Visit: Payer: Medicare Other | Attending: Internal Medicine

## 2019-09-27 DIAGNOSIS — Z20822 Contact with and (suspected) exposure to covid-19: Secondary | ICD-10-CM

## 2019-09-28 LAB — NOVEL CORONAVIRUS, NAA: SARS-CoV-2, NAA: NOT DETECTED

## 2019-10-09 ENCOUNTER — Encounter: Payer: Self-pay | Admitting: Internal Medicine

## 2019-10-28 ENCOUNTER — Other Ambulatory Visit: Payer: Self-pay

## 2019-10-28 ENCOUNTER — Ambulatory Visit: Payer: Medicare Other | Attending: Internal Medicine

## 2019-10-28 DIAGNOSIS — Z20822 Contact with and (suspected) exposure to covid-19: Secondary | ICD-10-CM

## 2019-10-29 LAB — NOVEL CORONAVIRUS, NAA: SARS-CoV-2, NAA: NOT DETECTED

## 2020-03-24 DIAGNOSIS — R0600 Dyspnea, unspecified: Secondary | ICD-10-CM | POA: Diagnosis not present

## 2020-03-24 DIAGNOSIS — R1031 Right lower quadrant pain: Secondary | ICD-10-CM | POA: Diagnosis not present

## 2020-03-24 DIAGNOSIS — R059 Cough, unspecified: Secondary | ICD-10-CM | POA: Insufficient documentation

## 2020-03-24 DIAGNOSIS — G4700 Insomnia, unspecified: Secondary | ICD-10-CM | POA: Insufficient documentation

## 2020-03-31 ENCOUNTER — Ambulatory Visit (INDEPENDENT_AMBULATORY_CARE_PROVIDER_SITE_OTHER): Payer: Medicare Other | Admitting: Gastroenterology

## 2020-03-31 ENCOUNTER — Other Ambulatory Visit: Payer: Self-pay

## 2020-03-31 ENCOUNTER — Encounter: Payer: Self-pay | Admitting: *Deleted

## 2020-03-31 ENCOUNTER — Encounter: Payer: Self-pay | Admitting: Gastroenterology

## 2020-03-31 VITALS — BP 139/75 | HR 73 | Temp 97.6°F | Ht 64.0 in | Wt 202.2 lb

## 2020-03-31 DIAGNOSIS — R1031 Right lower quadrant pain: Secondary | ICD-10-CM | POA: Diagnosis not present

## 2020-03-31 DIAGNOSIS — K5900 Constipation, unspecified: Secondary | ICD-10-CM

## 2020-03-31 DIAGNOSIS — K7469 Other cirrhosis of liver: Secondary | ICD-10-CM

## 2020-03-31 DIAGNOSIS — K219 Gastro-esophageal reflux disease without esophagitis: Secondary | ICD-10-CM

## 2020-03-31 MED ORDER — PANTOPRAZOLE SODIUM 40 MG PO TBEC: 40.0000 mg | DELAYED_RELEASE_TABLET | Freq: Every day | ORAL | 3 refills | Status: DC

## 2020-03-31 NOTE — Progress Notes (Signed)
Referring Provider: Sinda Du, MD Primary Care Physician:  Alanson Puls, [facility] Primary GI: Dr. Gala Romney   Chief Complaint  Patient presents with  . Abdominal Pain    right abd, everyday, constant pain  . Gastroesophageal Reflux    out of med    HPI:   [name] is a 64 y.o. female presenting today with a history of cirrhosis due to  NASH, GERD, constipation, abnormal CT in 2018 with peripancreatic soft tissue density favored to represent upper normal gastrohepatic ligament node. Non-specific left hepatic lobe low-density lesion present that was noted in 2017. She has not been seen since Jan 2020. EGD due now for variceal screening. Last in 2019.    Constant RLQ pain. Feels like pressure when sitting up. Present for a few months. Nothing relieves. Pain is about a 7 out of 10. No pain with eating. No N/V. She was told she may have diverticulitis and constipation. BM once a day, sometimes every other day. Sometimes straining. Sometimes doesn't feel productive. Stool sometimes runny, sometimes hard. Does have a right-sided abdominal hernia that bulges at times but not painful.   +GERD. Ran out of Protonix, waiting on Medicaid card. Hasn't been on a PPI for about a year. No dysphagia. No overt GI bleeding.     Past Medical History:  Diagnosis Date  . Chronic constipation   . Cirrhosis ([place])    child pugh class A and MELD 5, completed hep A and B vaccine series, no HCC on 3/16 MRI  . Colonic inertia   . Depression   . Diverticula of colon   . Diverticulosis 01/31/2008   colonoscopy Dr Gala Romney  . GERD (gastroesophageal reflux disease)   . Hyperplastic colon polyp   . Liver mass   . Macular degeneration   . Obesity   . PUD (peptic ulcer disease) 1988  . Schizoaffective disorder    Mental Health-Dr Elias Else  . Seizures ([place])    as child; no medsd and has had no seizures since then.  . Wrist fracture    Left wrist s/p fall and surgical repair    Past Surgical  History:  Procedure Laterality Date  . ABDOMINAL HYSTERECTOMY    . CHOLECYSTECTOMY  08/2010   cholelithiasis; Dr Arnoldo Morale  . COLONOSCOPY  01/31/2008   Dr. Gala Romney- normal rectum, L sided diverticula, long tortuous colon. hyperplasitic  polyp.  . COLONOSCOPY WITH PROPOFOL N/A 10/19/2017    single 4 mm polyp in the sigmoid colon which was sessile, scattered small mouth diverticula in the sigmoid and descending colon, otherwise normal. Surgical pathology found the polyp to be hyperplastic.Recommended 10-year repeat colonoscopy.  Marland Kitchen ERCP W/ SPHINCTEROTOMY AND BALLOON DILATION  08/05/2010   normal ampulla/mild erythema in the gastric remnant without ulcerations or erosions. normal retroflexed view of the cardia/anastomosis normal/distal common bile duct stones s/p extraction and shincterotomy  . ESOPHAGOGASTRODUODENOSCOPY N/A 10/24/2013   [id number] esophagus. Surgically altered stomach. Abnormal gastric mucosa  -  status post biopsy (reactive gastrophathy)  . ESOPHAGOGASTRODUODENOSCOPY (EGD) WITH PROPOFOL N/A 11/30/2015   LA grade A esophagitis, no varices, surveillance in 2019  . ESOPHAGOGASTRODUODENOSCOPY (EGD) WITH PROPOFOL N/A 10/19/2017   erosive reflux esophagitis status post dilation, surgically altered stomach status post gastric bypass with a single patent apparent small bowel limb, small hiatal hernia.  . EUS N/A 08/20/2015   Dr. Ardis Hughs: well-circumscribed 5.2 cm chronic mass in porta hepatis, internally mass is gelatinous. Small distal esophageal varices (limited view), Bilroth 1 type anatomy, cytology  NEGATIVE for malignant cells. Suspicion for neoplasm low   . FOOT SURGERY     left  . GASTRIC BYPASS     with Revision, Dr. Duffy Rhody  . MALONEY DILATION N/A 10/19/2017   Procedure: Venia Minks DILATION;  Surgeon: Daneil Dolin, MD;  [facility];  Service: Endoscopy;  Laterality: N/A;  . POLYPECTOMY  10/19/2017   Procedure: POLYPECTOMY;  Surgeon: Daneil Dolin, MD;  Location: [facility]  END[facility];  Service: Endoscopy;;  sigmoid colon (CS)   . REDUCTION MAMMAPLASTY Bilateral   . S/P Hysterectomy     partial  . TONSILLECTOMY    . WRIST SURGERY Left 05/2016    Current Outpatient Medications  Medication Sig Dispense Refill  . albuterol (VENTOLIN HFA) 108 (90 Base) MCG/ACT inhaler Inhale 1 puff into the lungs every 6 (six) hours as needed.    Marland Kitchen Fexofenadine HCl (MUCINEX ALLERGY PO) Take by mouth 2 (two) times daily.    Marland Kitchen FLUoxetine (PROZAC) 40 MG capsule Take 40 mg by mouth daily.     . risperiDONE (RISPERDAL) 3 MG tablet Take 3 mg by mouth at bedtime.     . traZODone (DESYREL) 150 MG tablet Take 300 mg by mouth at bedtime.    . pantoprazole (PROTONIX) 40 MG tablet Take 1 tablet (40 mg total) by mouth daily. 30 minutes before breakfast daily 90 tablet 3   No current facility-administered medications for this visit.    Allergies as of 03/31/2020 - Review Complete 03/31/2020  Allergen Reaction Noted  . Ciprofloxacin Rash   . Kiwi extract Other (See Comments) 11/25/2015    Family History  Problem Relation Age of Onset  . Diabetes Father   . Colon cancer Maternal Uncle   . Liver cancer Maternal Uncle        63 years old  . Liver disease Neg Hx     Social History   Socioeconomic History  . Marital status: Divorced    Spouse name: Not on file  . Number of children: 2  . Years of education: Not on file  . Highest education level: Not on file  Occupational History  . Occupation: disabled  Tobacco Use  . Smoking status: Current Every Day Smoker    Packs/day: 1.00    Years: 36.00    Pack years: 36.00    Types: Cigarettes    Last attempt to quit: 08/15/2017    Years since quitting: 2.6  . Smokeless tobacco: Never Used  Vaping Use  . Vaping Use: Never used  Substance and Sexual Activity  . Alcohol use: No    Alcohol/week: 0.0 standard drinks  . Drug use: Yes    Types: Marijuana    Comment: 3 joints/day  . Sexual activity: Never  Other Topics Concern    . Not on file  Social History Narrative   Lives w/ mother   2 grown children   Social Determinants of Health   Financial Resource Strain:   . Difficulty of Paying Living Expenses:   Food Insecurity:   . Worried About Charity fundraiser in the Last Year:   . Arboriculturist in the Last Year:   Transportation Needs:   . Film/video editor (Medical):   Marland Kitchen Lack of Transportation (Non-Medical):   Physical Activity:   . Days of Exercise per Week:   . Minutes of Exercise per Session:   Stress:   . Feeling of Stress :   Social Connections:   . Frequency of  Communication with Friends and Family:   . Frequency of Social Gatherings with Friends and Family:   . Attends Religious Services:   . Active Member of Clubs or Organizations:   . Attends Archivist Meetings:   Marland Kitchen Marital Status:     Review of Systems: Gen: Denies fever, chills, anorexia. Denies fatigue, weakness, weight loss.  CV: Denies chest pain, palpitations, syncope, peripheral edema, and claudication. Resp: Denies dyspnea at rest, cough, wheezing, coughing up blood, and pleurisy. GI: see HPI  Derm: Denies rash, itching, dry skin Psych: Denies depression, anxiety, memory loss, confusion. No homicidal or suicidal ideation.  Heme: Denies bruising, bleeding, and enlarged lymph nodes.  Physical Exam: BP (!) 139/75   Pulse 73   Temp 97.6 F (36.4 C) (Oral)   Ht 5\' 4"  (1.626 m)   Wt 202 lb 3.2 oz (91.7 kg)   BMI 34.71 kg/m  General:   Alert and oriented. No distress noted. Pleasant and cooperative.  Head:  Normocephalic and atraumatic. Eyes:  Conjuctiva clear without scleral icterus. Mouth:  Mask in place Lungs: coarse, wheezing bilaterally Cardiac: S1 S2 present without murmurs Abdomen:  +BS, soft, moderately TTP to right of umbilicus and RLQ, right upper quadrant ventral hernia without pain, and non-distended. No HSM.  Msk:  Symmetrical without gross deformities. Normal posture. Extremities:  Without  edema. Neurologic:  Alert and  oriented x4 Psych:  Alert and cooperative. Normal mood and affect.  ASSESSMENT: [name] is a 64 y.o. female presenting today with history of NASH cirrhosis, GERD, constipation, now with RLQ pain that has persisted for several months.   CT was completed after visit (without IV contrast as unable to obtain IV after multiple attempts), without acute findings. Non-obstructive known bowel-containing ventral hernia. Hernia does not appear to be causing her issues, as pain is not located in this region. Right lower lobe area of peripheral ground-glass opacity, ?infectious or inflammatory. We are arranging CXR with PCP to follow thereafter.   GERD: refilled Protonix; unfortunately, she has been off for almost a year and lost to follow-up.  Constipation: could be playing a role in abdominal pain. Will increase Linzess to 290 mcg daily.   Cirrhosis: due for EGD for variceal screening now. Will arrange.    PLAN:   Continue Protonix daily; refills provided  Increase Linzess to 290 mcg daily  CT completed after visit as noted above  Routine labs today including CBC, CMP, INR  Proceed with upper endoscopy in the near future with Dr. Gala Romney using Propofol. The risks, benefits, and alternatives have been discussed in detail with patient. They have stated understanding and desire to proceed.  Return in  3 months.  Annitta Needs, PhD, ANP-BC [facility]

## 2020-03-31 NOTE — Patient Instructions (Signed)
I have sent in Protonix to take each morning, 30 minutes before breakfast.  For constipation, let's start Linzess 145 microgram capsule (1 capsule) each morning, 30 minutes before breakfast. Let me know how this works for you.  Please have blood work done today.   We are arranging a CT this week to evaluate your pain.  I would like to see you in 3 months!  I enjoyed seeing you again today! As you know, I value our relationship and want to provide genuine, compassionate, and quality care. I welcome your feedback. If you receive a survey regarding your visit,  I greatly appreciate you taking time to fill this out. See you next time!  Annitta Needs, PhD, ANP-BC [facility]

## 2020-04-01 DIAGNOSIS — K7469 Other cirrhosis of liver: Secondary | ICD-10-CM | POA: Diagnosis not present

## 2020-04-02 LAB — CBC WITH DIFFERENTIAL/PLATELET
Basophils Absolute: 0.1 10*3/uL (ref 0.0–0.2)
Basos: 1 %
EOS (ABSOLUTE): 0.3 10*3/uL (ref 0.0–0.4)
Eos: 3 %
Hematocrit: 42.8 % (ref 34.0–46.6)
Hemoglobin: 14.4 g/dL (ref 11.1–15.9)
Immature Grans (Abs): 0 10*3/uL (ref 0.0–0.1)
Immature Granulocytes: 0 %
Lymphocytes Absolute: 1.6 10*3/uL (ref 0.7–3.1)
Lymphs: 18 %
MCH: 31.1 pg (ref 26.6–33.0)
MCHC: 33.6 g/dL (ref 31.5–35.7)
MCV: 92 fL (ref 79–97)
Monocytes Absolute: 0.8 10*3/uL (ref 0.1–0.9)
Monocytes: 9 %
Neutrophils Absolute: 6.5 10*3/uL (ref 1.4–7.0)
Neutrophils: 69 %
Platelets: 325 10*3/uL (ref 150–450)
RBC: 4.63 x10E6/uL (ref 3.77–5.28)
RDW: 13.5 % (ref 11.7–15.4)
WBC: 9.2 10*3/uL (ref 3.4–10.8)

## 2020-04-02 LAB — COMPREHENSIVE METABOLIC PANEL
ALT: 9 IU/L (ref 0–32)
AST: 10 IU/L (ref 0–40)
Albumin/Globulin Ratio: 1.6 (ref 1.2–2.2)
Albumin: 4.2 g/dL (ref 3.8–4.8)
Alkaline Phosphatase: 104 IU/L (ref 48–121)
BUN/Creatinine Ratio: 9 — ABNORMAL LOW (ref 12–28)
BUN: 12 mg/dL (ref 8–27)
Bilirubin Total: 0.4 mg/dL (ref 0.0–1.2)
CO2: 20 mmol/L (ref 20–29)
Calcium: 9.3 mg/dL (ref 8.7–10.3)
Chloride: 108 mmol/L — ABNORMAL HIGH (ref 96–106)
Creatinine, Ser: 1.29 mg/dL — ABNORMAL HIGH (ref 0.57–1.00)
GFR calc Af Amer: 51 mL/min/{1.73_m2} — ABNORMAL LOW (ref >59)
GFR calc non Af Amer: 44 mL/min/{1.73_m2} — ABNORMAL LOW (ref >59)
Globulin, Total: 2.6 g/dL (ref 1.5–4.5)
Glucose: 90 mg/dL (ref 65–99)
Potassium: 4.3 mmol/L (ref 3.5–5.2)
Sodium: 143 mmol/L (ref 134–144)
Total Protein: 6.8 g/dL (ref 6.0–8.5)

## 2020-04-02 LAB — PROTIME-INR
INR: 1 (ref 0.9–1.2)
Prothrombin Time: 10.8 s (ref 9.1–12.0)

## 2020-04-03 ENCOUNTER — Ambulatory Visit (HOSPITAL_COMMUNITY)
Admission: RE | Admit: 2020-04-03 | Discharge: 2020-04-03 | Disposition: A | Discharge status: Home / Self Care | Payer: Medicare Other | Source: Ambulatory Visit | Attending: Gastroenterology | Admitting: Gastroenterology

## 2020-04-03 ENCOUNTER — Other Ambulatory Visit: Payer: Self-pay

## 2020-04-03 ENCOUNTER — Other Ambulatory Visit: Payer: Self-pay | Admitting: Gastroenterology

## 2020-04-03 DIAGNOSIS — R1031 Right lower quadrant pain: Secondary | ICD-10-CM | POA: Insufficient documentation

## 2020-04-03 DIAGNOSIS — E278 Other specified disorders of adrenal gland: Secondary | ICD-10-CM | POA: Diagnosis not present

## 2020-04-03 DIAGNOSIS — I700 Atherosclerosis of aorta: Secondary | ICD-10-CM | POA: Diagnosis not present

## 2020-04-03 DIAGNOSIS — K469 Unspecified abdominal hernia without obstruction or gangrene: Secondary | ICD-10-CM | POA: Diagnosis not present

## 2020-04-03 DIAGNOSIS — K439 Ventral hernia without obstruction or gangrene: Secondary | ICD-10-CM | POA: Diagnosis not present

## 2020-04-03 MED ORDER — IOHEXOL 300 MG/ML  SOLN: 80.0000 mL | Freq: Once | INTRAMUSCULAR | Status: DC | PRN

## 2020-04-08 ENCOUNTER — Encounter: Payer: Self-pay | Admitting: Gastroenterology

## 2020-04-08 ENCOUNTER — Other Ambulatory Visit: Payer: Self-pay | Admitting: *Deleted

## 2020-04-08 DIAGNOSIS — R059 Cough, unspecified: Secondary | ICD-10-CM

## 2020-04-09 NOTE — Progress Notes (Signed)
Cc'ed to pcp °

## 2020-04-21 DIAGNOSIS — H9319 Tinnitus, unspecified ear: Secondary | ICD-10-CM | POA: Diagnosis not present

## 2020-04-21 DIAGNOSIS — G4700 Insomnia, unspecified: Secondary | ICD-10-CM | POA: Diagnosis not present

## 2020-04-21 DIAGNOSIS — Z0001 Encounter for general adult medical examination with abnormal findings: Secondary | ICD-10-CM | POA: Diagnosis not present

## 2020-04-21 DIAGNOSIS — Z79899 Other long term (current) drug therapy: Secondary | ICD-10-CM | POA: Diagnosis not present

## 2020-05-13 ENCOUNTER — Telehealth: Payer: Self-pay

## 2020-05-13 DIAGNOSIS — K219 Gastro-esophageal reflux disease without esophagitis: Secondary | ICD-10-CM

## 2020-05-13 NOTE — Telephone Encounter (Signed)
New RX Request from [facility] for Pantoprazole 40 mg daily, 90 day supply. Pt previously filled medication at [facility]. Pharmacy added to pts pharmacy list.

## 2020-05-18 DIAGNOSIS — Z0001 Encounter for general adult medical examination with abnormal findings: Secondary | ICD-10-CM | POA: Diagnosis not present

## 2020-05-18 DIAGNOSIS — Z79899 Other long term (current) drug therapy: Secondary | ICD-10-CM | POA: Diagnosis not present

## 2020-05-18 MED ORDER — PANTOPRAZOLE SODIUM 40 MG PO TBEC: 40.0000 mg | DELAYED_RELEASE_TABLET | Freq: Every day | ORAL | 3 refills | Status: DC

## 2020-05-18 NOTE — Addendum Note (Signed)
Addended by: Gordy Levan, Karn Derk A on: 05/18/2020 07:50 PM   Modules accepted: Orders

## 2020-06-01 DIAGNOSIS — R0600 Dyspnea, unspecified: Secondary | ICD-10-CM | POA: Diagnosis not present

## 2020-06-01 DIAGNOSIS — F17210 Nicotine dependence, cigarettes, uncomplicated: Secondary | ICD-10-CM | POA: Diagnosis not present

## 2020-06-01 DIAGNOSIS — E7849 Other hyperlipidemia: Secondary | ICD-10-CM | POA: Diagnosis not present

## 2020-06-15 DIAGNOSIS — Z23 Encounter for immunization: Secondary | ICD-10-CM | POA: Diagnosis not present

## 2020-07-01 ENCOUNTER — Encounter: Payer: Self-pay | Admitting: Internal Medicine

## 2020-07-01 ENCOUNTER — Ambulatory Visit: Payer: Medicare Other | Admitting: Gastroenterology

## 2020-08-04 DIAGNOSIS — R197 Diarrhea, unspecified: Secondary | ICD-10-CM | POA: Diagnosis not present

## 2020-08-04 DIAGNOSIS — R110 Nausea: Secondary | ICD-10-CM | POA: Diagnosis not present

## 2020-08-04 DIAGNOSIS — R1110 Vomiting, unspecified: Secondary | ICD-10-CM | POA: Diagnosis not present

## 2020-08-04 DIAGNOSIS — Z79899 Other long term (current) drug therapy: Secondary | ICD-10-CM | POA: Diagnosis not present

## 2020-08-04 DIAGNOSIS — Z1159 Encounter for screening for other viral diseases: Secondary | ICD-10-CM | POA: Diagnosis not present

## 2020-08-11 DIAGNOSIS — R197 Diarrhea, unspecified: Secondary | ICD-10-CM | POA: Diagnosis not present

## 2020-08-11 DIAGNOSIS — D689 Coagulation defect, unspecified: Secondary | ICD-10-CM | POA: Diagnosis not present

## 2020-08-11 DIAGNOSIS — E878 Other disorders of electrolyte and fluid balance, not elsewhere classified: Secondary | ICD-10-CM | POA: Diagnosis not present

## 2020-08-11 DIAGNOSIS — R252 Cramp and spasm: Secondary | ICD-10-CM | POA: Diagnosis not present

## 2020-08-12 DIAGNOSIS — R197 Diarrhea, unspecified: Secondary | ICD-10-CM | POA: Diagnosis not present

## 2020-08-12 DIAGNOSIS — R252 Cramp and spasm: Secondary | ICD-10-CM | POA: Diagnosis not present

## 2020-08-12 DIAGNOSIS — D689 Coagulation defect, unspecified: Secondary | ICD-10-CM | POA: Diagnosis not present

## 2020-08-18 ENCOUNTER — Other Ambulatory Visit: Payer: Self-pay | Admitting: Family Medicine

## 2020-08-18 ENCOUNTER — Other Ambulatory Visit (HOSPITAL_COMMUNITY): Payer: Self-pay | Admitting: Family Medicine

## 2020-08-18 DIAGNOSIS — M79605 Pain in left leg: Secondary | ICD-10-CM | POA: Diagnosis not present

## 2020-08-18 DIAGNOSIS — R791 Abnormal coagulation profile: Secondary | ICD-10-CM

## 2020-08-18 DIAGNOSIS — R197 Diarrhea, unspecified: Secondary | ICD-10-CM | POA: Diagnosis not present

## 2020-08-19 ENCOUNTER — Other Ambulatory Visit: Payer: Self-pay

## 2020-08-19 ENCOUNTER — Other Ambulatory Visit (HOSPITAL_COMMUNITY): Payer: Self-pay | Admitting: Family Medicine

## 2020-08-19 ENCOUNTER — Ambulatory Visit (HOSPITAL_COMMUNITY)
Admission: RE | Admit: 2020-08-19 | Discharge: 2020-08-19 | Disposition: A | Discharge status: Home / Self Care | Payer: Medicare Other | Source: Ambulatory Visit | Attending: Family Medicine | Admitting: Family Medicine

## 2020-08-19 DIAGNOSIS — I82432 Acute embolism and thrombosis of left popliteal vein: Secondary | ICD-10-CM | POA: Diagnosis not present

## 2020-08-19 DIAGNOSIS — R791 Abnormal coagulation profile: Secondary | ICD-10-CM

## 2020-08-19 DIAGNOSIS — M79605 Pain in left leg: Secondary | ICD-10-CM | POA: Insufficient documentation

## 2020-08-19 DIAGNOSIS — I82890 Acute embolism and thrombosis of other specified veins: Secondary | ICD-10-CM | POA: Diagnosis not present

## 2020-08-19 DIAGNOSIS — I82443 Acute embolism and thrombosis of tibial vein, bilateral: Secondary | ICD-10-CM | POA: Diagnosis not present

## 2020-09-01 DIAGNOSIS — R197 Diarrhea, unspecified: Secondary | ICD-10-CM | POA: Diagnosis not present

## 2020-09-01 DIAGNOSIS — I82401 Acute embolism and thrombosis of unspecified deep veins of right lower extremity: Secondary | ICD-10-CM | POA: Diagnosis not present

## 2020-09-08 DIAGNOSIS — I82401 Acute embolism and thrombosis of unspecified deep veins of right lower extremity: Secondary | ICD-10-CM | POA: Insufficient documentation

## 2020-09-08 DIAGNOSIS — I8290 Acute embolism and thrombosis of unspecified vein: Secondary | ICD-10-CM | POA: Insufficient documentation

## 2020-09-17 ENCOUNTER — Other Ambulatory Visit: Payer: Self-pay

## 2020-09-17 ENCOUNTER — Encounter: Payer: Self-pay | Admitting: *Deleted

## 2020-09-17 ENCOUNTER — Ambulatory Visit (INDEPENDENT_AMBULATORY_CARE_PROVIDER_SITE_OTHER): Payer: Medicare Other | Admitting: Gastroenterology

## 2020-09-17 ENCOUNTER — Encounter: Payer: Self-pay | Admitting: Gastroenterology

## 2020-09-17 VITALS — BP 130/76 | HR 90 | Temp 97.1°F | Ht 64.0 in | Wt 185.4 lb

## 2020-09-17 DIAGNOSIS — R935 Abnormal findings on diagnostic imaging of other abdominal regions, including retroperitoneum: Secondary | ICD-10-CM

## 2020-09-17 DIAGNOSIS — K219 Gastro-esophageal reflux disease without esophagitis: Secondary | ICD-10-CM | POA: Diagnosis not present

## 2020-09-17 DIAGNOSIS — K7460 Unspecified cirrhosis of liver: Secondary | ICD-10-CM

## 2020-09-17 DIAGNOSIS — R197 Diarrhea, unspecified: Secondary | ICD-10-CM | POA: Diagnosis not present

## 2020-09-17 NOTE — Progress Notes (Signed)
Referring Provider: Alanson Puls [facility] Primary Care Physician:  [facility] Primary GI: Dr. Gala Romney   Chief Complaint  Patient presents with  . Diarrhea    Daily x 9 weeks  . Abdominal Pain    Occ, across mid abd  . Emesis    Few times    HPI:   [name] is a 65 y.o. female presenting today with a history of cirrhosis due to  NASH, GERD, constipation, abnormal CT in 2018 with peripancreatic soft tissue density favored to represent upper normal gastrohepatic ligament node. Non-specific left hepatic lobe low-density lesion present that was noted in 2017. CT in July 2021 due to RLQ  without IV contrast as unable to obtain IV after multiple attempts), without acute findings. Non-obstructive known bowel-containing ventral hernia. Hernia does not appear to be causing her issues, as pain is not located in this region. Right lower lobe area of peripheral ground-glass opacity, ?infectious or inflammatory. She was to have a CXR but did not complete this. Overdue for EGD for variceal screening, as last was in 2019.   She was recently diagnosed with left popliteal vein DVT Dec 2021. Will be on Eliquis for 6 months. Has had diarrhea for 9 weeks, 6-7 loose stools per day. Malodorous odor.  Sometimes watery, sometimes pieces in it. Doesn't think she is urinating enough. Feels face is puffy. No med changes other than starting Eliquis in Dec 2021. Abdominal cramping at times across abdomen, relieved after BM. No rectal bleeding. Good appetite. No recent antibiotics that she is aware. No sick contacts. [place] water. Vomiting about 3 times, day before yesterday. No fever or chills but stays cold.   GERD controlled with Protonix.   Past Medical History:  Diagnosis Date  . Chronic constipation   . Cirrhosis ([place])    child pugh class A and MELD 5, completed hep A and B vaccine series, no HCC on 3/16 MRI  . Colonic inertia   . Depression   . Diverticula of colon   .  Diverticulosis 01/31/2008   colonoscopy Dr Gala Romney  . GERD (gastroesophageal reflux disease)   . Hyperplastic colon polyp   . Liver mass   . Macular degeneration   . Obesity   . PUD (peptic ulcer disease) 1988  . Schizoaffective disorder    Mental Health-Dr Elias Else  . Seizures ([place])    as child; no medsd and has had no seizures since then.  . Wrist fracture    Left wrist s/p fall and surgical repair    Past Surgical History:  Procedure Laterality Date  . ABDOMINAL HYSTERECTOMY    . CHOLECYSTECTOMY  08/2010   cholelithiasis; Dr Arnoldo Morale  . COLONOSCOPY  01/31/2008   Dr. Gala Romney- normal rectum, L sided diverticula, long tortuous colon. hyperplasitic  polyp.  . COLONOSCOPY WITH PROPOFOL N/A 10/19/2017    single 4 mm polyp in the sigmoid colon which was sessile, scattered small mouth diverticula in the sigmoid and descending colon, otherwise normal. Surgical pathology found the polyp to be hyperplastic.Recommended 10-year repeat colonoscopy.  Marland Kitchen ERCP W/ SPHINCTEROTOMY AND BALLOON DILATION  08/05/2010   normal ampulla/mild erythema in the gastric remnant without ulcerations or erosions. normal retroflexed view of the cardia/anastomosis normal/distal common bile duct stones s/p extraction and shincterotomy  . ESOPHAGOGASTRODUODENOSCOPY N/A 10/24/2013   [id number] esophagus. Surgically altered stomach. Abnormal gastric mucosa  -  status post biopsy (reactive gastrophathy)  . ESOPHAGOGASTRODUODENOSCOPY (EGD) WITH PROPOFOL N/A 11/30/2015  LA grade A esophagitis, no varices, surveillance in 2019  . ESOPHAGOGASTRODUODENOSCOPY (EGD) WITH PROPOFOL N/A 10/19/2017   erosive reflux esophagitis status post dilation, surgically altered stomach status post gastric bypass with a single patent apparent small bowel limb, small hiatal hernia.  . EUS N/A 08/20/2015   Dr. Ardis Hughs: well-circumscribed 5.2 cm chronic mass in porta hepatis, internally mass is gelatinous. Small distal esophageal varices (limited view),  Bilroth 1 type anatomy, cytology NEGATIVE for malignant cells. Suspicion for neoplasm low   . FOOT SURGERY     left  . GASTRIC BYPASS     with Revision, Dr. Duffy Rhody  . MALONEY DILATION N/A 10/19/2017   Procedure: Venia Minks DILATION;  Surgeon: Daneil Dolin, MD;  [facility];  Service: Endoscopy;  Laterality: N/A;  . POLYPECTOMY  10/19/2017   Procedure: POLYPECTOMY;  Surgeon: Daneil Dolin, MD;  [facility];  Service: Endoscopy;;  sigmoid colon (CS)   . REDUCTION MAMMAPLASTY Bilateral   . S/P Hysterectomy     partial  . TONSILLECTOMY    . WRIST SURGERY Left 05/2016    Current Outpatient Medications  Medication Sig Dispense Refill  . albuterol (VENTOLIN HFA) 108 (90 Base) MCG/ACT inhaler Inhale 1 puff into the lungs every 6 (six) hours as needed.    Marland Kitchen ELIQUIS 5 MG TABS tablet Take 5 mg by mouth 2 (two) times daily.    Marland Kitchen FLUoxetine (PROZAC) 40 MG capsule Take 40 mg by mouth daily.    . pantoprazole (PROTONIX) 40 MG tablet Take 1 tablet (40 mg total) by mouth daily. 30 minutes before breakfast daily 90 tablet 3  . risperiDONE (RISPERDAL) 3 MG tablet Take 3 mg by mouth at bedtime.     . traZODone (DESYREL) 150 MG tablet Take 300 mg by mouth at bedtime.    Marland Kitchen Fexofenadine HCl (MUCINEX ALLERGY PO) Take by mouth 2 (two) times daily. (Patient not taking: Reported on 09/17/2020)     No current facility-administered medications for this visit.    Allergies as of 09/17/2020 - Review Complete 09/17/2020  Allergen Reaction Noted  . Ciprofloxacin Rash   . Kiwi extract Other (See Comments) 11/25/2015    Family History  Problem Relation Age of Onset  . Diabetes Father   . Colon cancer Maternal Uncle   . Liver cancer Maternal Uncle        37 years old  . Liver disease Neg Hx     Social History   Socioeconomic History  . Marital status: Divorced    Spouse name: Not on file  . Number of children: 2  . Years of education: Not on file  . Highest education level:  Not on file  Occupational History  . Occupation: disabled  Tobacco Use  . Smoking status: Current Every Day Smoker    Packs/day: 1.00    Years: 36.00    Pack years: 36.00    Types: Cigarettes    Last attempt to quit: 08/15/2017    Years since quitting: 3.0  . Smokeless tobacco: Never Used  Vaping Use  . Vaping Use: Never used  Substance and Sexual Activity  . Alcohol use: No    Alcohol/week: 0.0 standard drinks  . Drug use: Yes    Types: Marijuana    Comment: 3 joints/day  . Sexual activity: Never  Other Topics Concern  . Not on file  Social History Narrative   Lives w/ mother   2 grown children   Social Determinants of Health  Financial Resource Strain: Not on file  Food Insecurity: Not on file  Transportation Needs: Not on file  Physical Activity: Not on file  Stress: Not on file  Social Connections: Not on file    Review of Systems: Gen: Denies fever, chills, anorexia. Denies fatigue, weakness, weight loss.  CV: Denies chest pain, palpitations, syncope, peripheral edema, and claudication. Resp: Denies dyspnea at rest, cough, wheezing, coughing up blood, and pleurisy. GI: see HPI Derm: Denies rash, itching, dry skin Psych: Denies depression, anxiety, memory loss, confusion. No homicidal or suicidal ideation.  Heme: Denies bruising, bleeding, and enlarged lymph nodes.  Physical Exam: BP 130/76   Pulse 90   Temp (!) 97.1 F (36.2 C) (Temporal)   Ht 5\' 4"  (1.626 m)   Wt 185 lb 6.4 oz (84.1 kg)   BMI 31.82 kg/m  General:   Alert and oriented. No distress noted. Pleasant and cooperative.  Head:  Normocephalic and atraumatic. Eyes:  Conjuctiva clear without scleral icterus. Mouth:  Mask in place Abdomen:  +BS, soft, non-tender and non-distended. No rebound or guarding. No HSM or masses noted. Msk:  Symmetrical without gross deformities. Normal posture. Extremities:  Without edema. Neurologic:  Alert and  oriented x4 Psych:  Alert and cooperative. Normal  mood and affect.  ASSESSMENT: JASNOOR TRUSSELL is a 65 y.o. female presenting today with history of NASH cirrhosis, GERD, historically with constipation, recently diagnosed with left leg DVT in Dec 2021 and now on Eliquis, now with diarrhea for past 9 weeks.   Diarrhea etiology unclear but need to rule out infectious process first. She does not recall any sick contacts, abx exposure, or changes in meds (other than Eliquis, starting in Dec 2021). Will check Cdiff and GI pathogen panel.   Cirrhosis: remains well-compensated. RUQ [place] due now. She will also need an EGD at some point for routine screening, but we will wait until she is off Eliquis in next few months as this is not urgent.  Right lower lobe area of peripheral ground-glass opacity on CT in July 2021: CXR had been ordered but not completed. I have ordered this again.    PLAN:  Cdiff, GI pathogen panel CXR RUQ US abdomen Will trial Bentyl if stool studies negative Return in 4-6 weeks for close follow-up EGD once off Eliquis  Annitta Needs, PhD, [facility]

## 2020-09-17 NOTE — Patient Instructions (Signed)
For diarrhea: please complete the stool tests as soon as you can. I am holding off on medication to give you until we make sure there is not an infection. Once we get the results, we can decide on a plan!  I have ordered a chest xray to follow-up on a little place on your lung that was seen on the CT scan that was probably infectious-related, but we need to make sure it is resolved.  I have also ordered an ultrasound for routine assessment of your liver.  You will need an endoscopy at some point this year, but we will wait until you are off blood thinners.  Will see you in 4-6 weeks for close follow-up!  I enjoyed seeing you again today! As you know, I value our relationship and want to provide genuine, compassionate, and quality care. I welcome your feedback. If you receive a survey regarding your visit,  I greatly appreciate you taking time to fill this out. See you next time!  Annitta Needs, PhD, ANP-BC [facility]

## 2020-09-23 ENCOUNTER — Ambulatory Visit (HOSPITAL_COMMUNITY): Payer: Medicare Other

## 2020-09-24 ENCOUNTER — Ambulatory Visit (HOSPITAL_COMMUNITY)
Admission: RE | Admit: 2020-09-24 | Discharge: 2020-09-24 | Disposition: A | Discharge status: Home / Self Care | Payer: Medicare Other | Source: Ambulatory Visit | Attending: Gastroenterology | Admitting: Gastroenterology

## 2020-09-24 ENCOUNTER — Other Ambulatory Visit: Payer: Self-pay

## 2020-09-24 DIAGNOSIS — K7460 Unspecified cirrhosis of liver: Secondary | ICD-10-CM | POA: Insufficient documentation

## 2020-10-02 ENCOUNTER — Other Ambulatory Visit: Payer: Medicare Other

## 2020-10-02 DIAGNOSIS — Z20822 Contact with and (suspected) exposure to covid-19: Secondary | ICD-10-CM | POA: Diagnosis not present

## 2020-10-04 LAB — SARS-COV-2, NAA 2 DAY TAT

## 2020-10-04 LAB — NOVEL CORONAVIRUS, NAA: SARS-CoV-2, NAA: DETECTED — AB

## 2020-10-28 ENCOUNTER — Other Ambulatory Visit: Payer: Self-pay

## 2020-10-28 ENCOUNTER — Encounter: Payer: Self-pay | Admitting: Gastroenterology

## 2020-10-28 ENCOUNTER — Telehealth (INDEPENDENT_AMBULATORY_CARE_PROVIDER_SITE_OTHER): Payer: Medicare Other | Admitting: Gastroenterology

## 2020-10-28 VITALS — BP 122/75 | HR 82 | Temp 97.3°F | Ht 64.0 in | Wt 187.0 lb

## 2020-10-28 DIAGNOSIS — K7460 Unspecified cirrhosis of liver: Secondary | ICD-10-CM | POA: Diagnosis not present

## 2020-10-28 DIAGNOSIS — K219 Gastro-esophageal reflux disease without esophagitis: Secondary | ICD-10-CM

## 2020-10-28 NOTE — Progress Notes (Signed)
Referring Provider: Alanson Puls [facility] Primary Care Physician:  [facility] Primary GI: Dr. Gala Romney   Chief Complaint  Patient presents with  . Diarrhea    stopped  . Gastroesophageal Reflux    Protonix helping    HPI:   [name] is a 65 y.o. female presenting today with a history of cirrhosis due to NASH, GERD, constipation, abnormal CT in 2018 with peripancreatic soft tissue density favored to represent upper normal gastrohepatic ligament node. Non-specific left hepatic lobe low-density lesion present that was noted in 2017. CT in July 2021 due to RLQ  without IV contrast as unable to obtain IV after multiple attempts), without acute findings. Non-obstructive known bowel-containing ventral hernia. Hernia does not appear to be causing her issues, as pain is not located in this region. Right lower lobe area of peripheral ground-glass opacity, ?infectious or inflammatory. She was to have a CXR but did not complete this. Overdue for EGD for variceal screening, as last was in 2019.   She was recently diagnosed with left popliteal vein DVT Dec 2021. Will be on Eliquis for 6 months. Noted diarrhea at visit in Jan 2022: GI path panel and Cdiff not completed. RUQ [place] recently completed. Incidental small right pleural effusion noted. Still needs CXR. Recently with Covid 1/28.   Diarrhea stopped shortly after seeing me. BM twice per day now. No rectal bleeding. No abdominal pain. No N/V. Protonix helping GERD symptoms. No dysphagia. Good appetite. Notes pedal and ankle edema. Present many years. Sometimes adds salt to food.   Hasn't taken Eliquis in over a month as couldn't afford it. Has a cough. Shortness of breath with exertion. Smokes a pack per day.    Past Medical History:  Diagnosis Date  . Chronic constipation   . Cirrhosis ([place])    child pugh class A and MELD 5, completed hep A and B vaccine series, no HCC on 3/16 MRI  . Colonic inertia   . Depression    . Diverticula of colon   . Diverticulosis 01/31/2008   colonoscopy Dr Gala Romney  . GERD (gastroesophageal reflux disease)   . Hyperplastic colon polyp   . Liver mass   . Macular degeneration   . Obesity   . PUD (peptic ulcer disease) 1988  . Schizoaffective disorder    Mental Health-Dr Elias Else  . Seizures ([place])    as child; no medsd and has had no seizures since then.  . Wrist fracture    Left wrist s/p fall and surgical repair    Past Surgical History:  Procedure Laterality Date  . ABDOMINAL HYSTERECTOMY    . CHOLECYSTECTOMY  08/2010   cholelithiasis; Dr Arnoldo Morale  . COLONOSCOPY  01/31/2008   Dr. Gala Romney- normal rectum, L sided diverticula, long tortuous colon. hyperplasitic  polyp.  . COLONOSCOPY WITH PROPOFOL N/A 10/19/2017    single 4 mm polyp in the sigmoid colon which was sessile, scattered small mouth diverticula in the sigmoid and descending colon, otherwise normal. Surgical pathology found the polyp to be hyperplastic.Recommended 10-year repeat colonoscopy.  Marland Kitchen ERCP W/ SPHINCTEROTOMY AND BALLOON DILATION  08/05/2010   normal ampulla/mild erythema in the gastric remnant without ulcerations or erosions. normal retroflexed view of the cardia/anastomosis normal/distal common bile duct stones s/p extraction and shincterotomy  . ESOPHAGOGASTRODUODENOSCOPY N/A 10/24/2013   JGG:EZMOQH esophagus. Surgically altered stomach. Abnormal gastric mucosa  -  status post biopsy (reactive gastrophathy)  . ESOPHAGOGASTRODUODENOSCOPY (EGD) WITH PROPOFOL N/A 11/30/2015   LA  grade A esophagitis, no varices, surveillance in 2019  . ESOPHAGOGASTRODUODENOSCOPY (EGD) WITH PROPOFOL N/A 10/19/2017   erosive reflux esophagitis status post dilation, surgically altered stomach status post gastric bypass with a single patent apparent small bowel limb, small hiatal hernia.  . EUS N/A 08/20/2015   Dr. Ardis Hughs: well-circumscribed 5.2 cm chronic mass in porta hepatis, internally mass is gelatinous. Small distal  esophageal varices (limited view), Bilroth 1 type anatomy, cytology NEGATIVE for malignant cells. Suspicion for neoplasm low   . FOOT SURGERY     left  . GASTRIC BYPASS     with Revision, Dr. Duffy Rhody  . MALONEY DILATION N/A 10/19/2017   Procedure: Venia Minks DILATION;  Surgeon: Daneil Dolin, MD;  Location: [facility];  Service: Endoscopy;  Laterality: N/A;  . POLYPECTOMY  10/19/2017   Procedure: POLYPECTOMY;  Surgeon: Daneil Dolin, MD;  Location: [facility];  Service: Endoscopy;;  sigmoid colon (CS)   . REDUCTION MAMMAPLASTY Bilateral   . S/P Hysterectomy     partial  . TONSILLECTOMY    . WRIST SURGERY Left 05/2016    Current Outpatient Medications  Medication Sig Dispense Refill  . albuterol (VENTOLIN HFA) 108 (90 Base) MCG/ACT inhaler Inhale 1 puff into the lungs every 6 (six) hours as needed.    Marland Kitchen FLUoxetine (PROZAC) 40 MG capsule Take 40 mg by mouth daily.    . pantoprazole (PROTONIX) 40 MG tablet Take 1 tablet (40 mg total) by mouth daily. 30 minutes before breakfast daily 90 tablet 3  . risperiDONE (RISPERDAL) 3 MG tablet Take 3 mg by mouth at bedtime.     . traZODone (DESYREL) 150 MG tablet Take 300 mg by mouth at bedtime.    Marland Kitchen ELIQUIS 5 MG TABS tablet Take 5 mg by mouth 2 (two) times daily. (Patient not taking: Reported on 10/28/2020)     No current facility-administered medications for this visit.    Allergies as of 10/28/2020 - Review Complete 10/28/2020  Allergen Reaction Noted  . Ciprofloxacin Rash   . Kiwi extract Other (See Comments) 11/25/2015    Family History  Problem Relation Age of Onset  . Diabetes Father   . Colon cancer Maternal Uncle   . Liver cancer Maternal Uncle        40 years old  . Liver disease Neg Hx     Social History   Socioeconomic History  . Marital status: Divorced    Spouse name: Not on file  . Number of children: 2  . Years of education: Not on file  . Highest education level: Not on file  Occupational History  .  Occupation: disabled  Tobacco Use  . Smoking status: Current Every Day Smoker    Packs/day: 1.00    Years: 36.00    Pack years: 36.00    Types: Cigarettes    Last attempt to quit: 08/15/2017    Years since quitting: 3.2  . Smokeless tobacco: Never Used  Vaping Use  . Vaping Use: Never used  Substance and Sexual Activity  . Alcohol use: No    Alcohol/week: 0.0 standard drinks  . Drug use: Yes    Types: Marijuana    Comment: 3 joints/day  . Sexual activity: Never  Other Topics Concern  . Not on file  Social History Narrative   Lives w/ mother   2 grown children   Social Determinants of Health   Financial Resource Strain: Not on file  Food Insecurity: Not on file  Transportation Needs: Not  on file  Physical Activity: Not on file  Stress: Not on file  Social Connections: Not on file    Review of Systems: Gen: Denies fever, chills, anorexia. Denies fatigue, weakness, weight loss.  CV: Denies chest pain, palpitations, syncope, peripheral edema, and claudication. Resp: Denies dyspnea at rest, cough, wheezing, coughing up blood, and pleurisy. GI: see HPI Derm: Denies rash, itching, dry skin Psych: Denies depression, anxiety, memory loss, confusion. No homicidal or suicidal ideation.  Heme: Denies bruising, bleeding, and enlarged lymph nodes.  Physical Exam: BP 122/75   Pulse 82   Temp (!) 97.3 F (36.3 C) (Temporal)   Ht 5\' 4"  (1.626 m)   Wt 187 lb (84.8 kg)   BMI 32.10 kg/m  General:   Alert and oriented. No distress noted. Pleasant and cooperative.  Head:  Normocephalic and atraumatic. Eyes:  Conjuctiva clear without scleral icterus. Mouth:  Mask in place Lungs: scattered rhonchi, no wheezing Cardiac: S1 S2 present without murmurs Abdomen:  +BS, soft, non-tender and non-distended. No rebound or guarding. No HSM or masses noted. Msk:  Symmetrical without gross deformities. Normal posture. Extremities:  With mild ankle and pedal edema  Neurologic:  Alert and   oriented x4 Psych:  Alert and cooperative. Normal mood and affect.  ASSESSMENT: [name] is a 65 y.o. female presenting today with history of NASH cirrhosis, GERD, historically with constipation, recently diagnosed with left leg DVT in Dec 2021 and prescribed Eliquis, returning in follow-up. Diarrhea noted at last visit for 9 weeks but now resolved.   Cirrhosis: remains well-compensated. [place] RUQ up-to-date. Due again in July 2022. Will update labs today. Due for EGD now but has been on Eliquis for DVT. Will need to revisit this at next visit. As of note, will reach out to [facility] as she has been off of this for a month due to finances. I have asked her to reach out as well. Mild pedal/ankle edema noted; discussed low sodium diet, as she is not following this currently.   GERD: well-controlled on Protonix once daily.  Right lower lobe peripheral ground-glass opacity last year in July 2021, with CXR recommended at that time. Recent US incidentally showing right pleural effusion. I have asked her to complete a CXR for completeness' sake.      PLAN:  CBC, CMP, INR, AFP RUQ [place] in July 2022 Patient to reach out to PCP regarding Eliquis EGD once off Eliquis CXR 2 gram sodium diet Return in April/May 2022  Annitta Needs, PhD, ANP-BC [facility]

## 2020-10-28 NOTE — Patient Instructions (Addendum)
Continue Protonix once daily, 30 minutes before breakfast.  Please complete chest xray at the hospital. This is a walk-in appointment and doesn't require a time.   Please complete blood work at [facility].  Please let your doctor know that you are not on Eliquis currently. I will call them, too.  We will see you in April/May 2022. Once we know you are off Eliquis, then we can proceed with the endoscopy.  Please make sure to limit salt intake to 2 grams daily.   I enjoyed seeing you again today! As you know, I value our relationship and want to provide genuine, compassionate, and quality care. I welcome your feedback. If you receive a survey regarding your visit,  I greatly appreciate you taking time to fill this out. See you next time!  Annitta Needs, PhD, ANP-BC [facility]   Low-Sodium Eating Plan Sodium, which is an element that makes up salt, helps you maintain a healthy balance of fluids in your body. Too much sodium can increase your blood pressure and cause fluid and waste to be held in your body. Your health care provider or dietitian may recommend following this plan if you have high blood pressure (hypertension), kidney disease, liver disease, or heart failure. Eating less sodium can help lower your blood pressure, reduce swelling, and protect your heart, liver, and kidneys. What are tips for following this plan? Reading food labels  The Nutrition Facts label lists the amount of sodium in one serving of the food. If you eat more than one serving, you must multiply the listed amount of sodium by the number of servings.  Choose foods with less than 140 mg of sodium per serving.  Avoid foods with 300 mg of sodium or more per serving. Shopping  Look for lower-sodium products, often labeled as "low-sodium" or "no salt added."  Always check the sodium content, even if foods are labeled as "unsalted" or "no salt added."  Buy fresh foods. ? Avoid canned foods and  pre-made or frozen meals. ? Avoid canned, cured, or processed meats.  Buy breads that have less than 80 mg of sodium per slice.   Cooking  Eat more home-cooked food and less restaurant, buffet, and fast food.  Avoid adding salt when cooking. Use salt-free seasonings or herbs instead of table salt or sea salt. Check with your health care provider or pharmacist before using salt substitutes.  Cook with plant-based oils, such as canola, sunflower, or olive oil.   Meal planning  When eating at a restaurant, ask that your food be prepared with less salt or no salt, if possible. Avoid dishes labeled as brined, pickled, cured, smoked, or made with soy sauce, miso, or teriyaki sauce.  Avoid foods that contain MSG (monosodium glutamate). MSG is sometimes added to [place] food, bouillon, and some canned foods.  Make meals that can be grilled, baked, poached, roasted, or steamed. These are generally made with less sodium. General information Most people on this plan should limit their sodium intake to 1,500-2,000 mg (milligrams) of sodium each day. What foods should I eat? Fruits Fresh, frozen, or canned fruit. Fruit juice. Vegetables Fresh or frozen vegetables. "No salt added" canned vegetables. "No salt added" tomato sauce and paste. Low-sodium or reduced-sodium tomato and vegetable juice. Grains Low-sodium cereals, including oats, puffed wheat and rice, and shredded wheat. Low-sodium crackers. Unsalted rice. Unsalted pasta. Low-sodium bread. Whole-grain breads and whole-grain pasta. Meats and other proteins Fresh or frozen (no salt added) meat, poultry, seafood, and  fish. Low-sodium canned tuna and salmon. Unsalted nuts. Dried peas, beans, and lentils without added salt. Unsalted canned beans. Eggs. Unsalted nut butters. Dairy Milk. Soy milk. Cheese that is naturally low in sodium, such as ricotta cheese, fresh mozzarella, or Swiss cheese. Low-sodium or reduced-sodium cheese. Cream cheese.  Yogurt. Seasonings and condiments Fresh and dried herbs and spices. Salt-free seasonings. Low-sodium mustard and ketchup. Sodium-free salad dressing. Sodium-free light mayonnaise. Fresh or refrigerated horseradish. Lemon juice. Vinegar. Other foods Homemade, reduced-sodium, or low-sodium soups. Unsalted popcorn and pretzels. Low-salt or salt-free chips. The items listed above may not be a complete list of foods and beverages you can eat. Contact a dietitian for more information. What foods should I avoid? Vegetables Sauerkraut, pickled vegetables, and relishes. Olives. [place] fries. Onion rings. Regular canned vegetables (not low-sodium or reduced-sodium). Regular canned tomato sauce and paste (not low-sodium or reduced-sodium). Regular tomato and vegetable juice (not low-sodium or reduced-sodium). Frozen vegetables in sauces. Grains Instant hot cereals. Bread stuffing, pancake, and biscuit mixes. Croutons. Seasoned rice or pasta mixes. Noodle soup cups. Boxed or frozen macaroni and cheese. Regular salted crackers. Self-rising flour. Meats and other proteins Meat or fish that is salted, canned, smoked, spiced, or pickled. Precooked or cured meat, such as sausages or meat loaves. Berniece Salines. Ham. Pepperoni. Hot dogs. Corned beef. Chipped beef. Salt pork. Jerky. Pickled herring. Anchovies and sardines. Regular canned tuna. Salted nuts. Dairy Processed cheese and cheese spreads. Hard cheeses. Cheese curds. Blue cheese. Feta cheese. String cheese. Regular cottage cheese. Buttermilk. Canned milk. Fats and oils Salted butter. Regular margarine. Ghee. Bacon fat. Seasonings and condiments Onion salt, garlic salt, seasoned salt, table salt, and sea salt. Canned and packaged gravies. Worcestershire sauce. Tartar sauce. Barbecue sauce. Teriyaki sauce. Soy sauce, including reduced-sodium. Steak sauce. Fish sauce. Oyster sauce. Cocktail sauce. Horseradish that you find on the shelf. Regular ketchup and mustard. Meat  flavorings and tenderizers. Bouillon cubes. Hot sauce. Pre-made or packaged marinades. Pre-made or packaged taco seasonings. Relishes. Regular salad dressings. Salsa. Other foods Salted popcorn and pretzels. Corn chips and puffs. Potato and tortilla chips. Canned or dried soups. Pizza. Frozen entrees and pot pies. The items listed above may not be a complete list of foods and beverages you should avoid. Contact a dietitian for more information. Summary  Eating less sodium can help lower your blood pressure, reduce swelling, and protect your heart, liver, and kidneys.  Most people on this plan should limit their sodium intake to 1,500-2,000 mg (milligrams) of sodium each day.  Canned, boxed, and frozen foods are high in sodium. Restaurant foods, fast foods, and pizza are also very high in sodium. You also get sodium by adding salt to food.  Try to cook at home, eat more fresh fruits and vegetables, and eat less fast food and canned, processed, or prepared foods. This information is not intended to replace advice given to you by your health care provider. Make sure you discuss any questions you have with your health care provider. Document Revised: 09/27/2019 Document Reviewed: 07/24/2019 Elsevier Patient Education  2021 [employer].

## 2020-11-16 ENCOUNTER — Other Ambulatory Visit: Payer: Self-pay | Admitting: Gastroenterology

## 2020-11-16 DIAGNOSIS — K219 Gastro-esophageal reflux disease without esophagitis: Secondary | ICD-10-CM

## 2020-12-25 DIAGNOSIS — K7460 Unspecified cirrhosis of liver: Secondary | ICD-10-CM | POA: Diagnosis not present

## 2020-12-26 LAB — COMPREHENSIVE METABOLIC PANEL
ALT: 12 IU/L (ref 0–32)
AST: 14 IU/L (ref 0–40)
Albumin/Globulin Ratio: 1.6 (ref 1.2–2.2)
Albumin: 3.9 g/dL (ref 3.8–4.8)
Alkaline Phosphatase: 89 IU/L (ref 44–121)
BUN/Creatinine Ratio: 10 — ABNORMAL LOW (ref 12–28)
BUN: 11 mg/dL (ref 8–27)
Bilirubin Total: 0.3 mg/dL (ref 0.0–1.2)
CO2: 22 mmol/L (ref 20–29)
Calcium: 9.9 mg/dL (ref 8.7–10.3)
Chloride: 108 mmol/L — ABNORMAL HIGH (ref 96–106)
Creatinine, Ser: 1.09 mg/dL — ABNORMAL HIGH (ref 0.57–1.00)
Globulin, Total: 2.4 g/dL (ref 1.5–4.5)
Glucose: 83 mg/dL (ref 65–99)
Potassium: 4.2 mmol/L (ref 3.5–5.2)
Sodium: 146 mmol/L — ABNORMAL HIGH (ref 134–144)
Total Protein: 6.3 g/dL (ref 6.0–8.5)
eGFR: 57 mL/min/{1.73_m2} — ABNORMAL LOW (ref >59)

## 2020-12-26 LAB — CBC WITH DIFFERENTIAL
Basophils Absolute: 0.1 10*3/uL (ref 0.0–0.2)
Basos: 1 %
EOS (ABSOLUTE): 0.2 10*3/uL (ref 0.0–0.4)
Eos: 2 %
Hematocrit: 43.6 % (ref 34.0–46.6)
Hemoglobin: 14.1 g/dL (ref 11.1–15.9)
Immature Grans (Abs): 0 10*3/uL (ref 0.0–0.1)
Immature Granulocytes: 0 %
Lymphocytes Absolute: 1.6 10*3/uL (ref 0.7–3.1)
Lymphs: 16 %
MCH: 31.3 pg (ref 26.6–33.0)
MCHC: 32.3 g/dL (ref 31.5–35.7)
MCV: 97 fL (ref 79–97)
Monocytes Absolute: 0.9 10*3/uL (ref 0.1–0.9)
Monocytes: 8 %
Neutrophils Absolute: 7.6 10*3/uL — ABNORMAL HIGH (ref 1.4–7.0)
Neutrophils: 73 %
RBC: 4.51 x10E6/uL (ref 3.77–5.28)
RDW: 12.8 % (ref 11.7–15.4)
WBC: 10.4 10*3/uL (ref 3.4–10.8)

## 2020-12-26 LAB — PROTIME-INR
INR: 1 (ref 0.9–1.2)
Prothrombin Time: 10.1 s (ref 9.1–12.0)

## 2020-12-26 LAB — AFP TUMOR MARKER: AFP, Serum, Tumor Marker: 2.4 ng/mL (ref 0.0–9.2)

## 2020-12-29 ENCOUNTER — Other Ambulatory Visit: Payer: Self-pay

## 2020-12-29 ENCOUNTER — Encounter: Payer: Self-pay | Admitting: Gastroenterology

## 2020-12-29 ENCOUNTER — Ambulatory Visit (INDEPENDENT_AMBULATORY_CARE_PROVIDER_SITE_OTHER): Payer: Medicare Other | Admitting: Gastroenterology

## 2020-12-29 ENCOUNTER — Encounter: Payer: Self-pay | Admitting: Internal Medicine

## 2020-12-29 VITALS — BP 118/71 | HR 73 | Temp 96.8°F | Ht 64.0 in | Wt 180.8 lb

## 2020-12-29 DIAGNOSIS — R911 Solitary pulmonary nodule: Secondary | ICD-10-CM | POA: Diagnosis not present

## 2020-12-29 DIAGNOSIS — K219 Gastro-esophageal reflux disease without esophagitis: Secondary | ICD-10-CM | POA: Diagnosis not present

## 2020-12-29 DIAGNOSIS — K7460 Unspecified cirrhosis of liver: Secondary | ICD-10-CM

## 2020-12-29 NOTE — Progress Notes (Signed)
Referring Provider: Alanson Puls [facility] Primary Care Physician:  [facility] Primary GI: Dr. Gala Romney  Chief Complaint  Patient presents with  . Cirrhosis    HPI:   [name] is a 65 y.o. female presenting today with a history of cirrhosis due to NASH, GERD, constipation, abnormal CT in 2018 with peripancreatic soft tissue density favored to represent upper normal gastrohepatic ligament node. Non-specific left hepatic lobe low-density lesion present that was noted in 2017. CT in July 2021 due to RLQwithout IV contrast as unable to obtain IV after multiple attempts), without acute findings. Non-obstructive known bowel-containing ventral hernia.  Right lower lobe area of peripheral ground-glass opacity, ?infectious or inflammatory.She was to have a CXR but did not complete this. Overdue for EGD for variceal screening, as last was in 2019.  History of popliteal vein DVT Dec 2021 on Eliquis, missing a month or so. She remains on it for a few more months per patient. We had held off on scheduling EGD for variceal screening in setting of anticoagulation; however, she tells me she will be off of this in a few months.  No abdominal pain, constipation, diarrhea, rectal bleeding, confusion, mental status changes, refractory GERD, or dysphagia. She is well-compensated.   Recent labs with MELD Na 7. AFP tumor marker for baseline normal.   Past Medical History:  Diagnosis Date  . Chronic constipation   . Cirrhosis ([place])    child pugh class A and MELD 5, completed hep A and B vaccine series, no HCC on 3/16 MRI  . Colonic inertia   . Depression   . Diverticula of colon   . Diverticulosis 01/31/2008   colonoscopy Dr Gala Romney  . GERD (gastroesophageal reflux disease)   . Hyperplastic colon polyp   . Liver mass   . Macular degeneration   . Obesity   . PUD (peptic ulcer disease) 1988  . Schizoaffective disorder    Mental Health-Dr Elias Else  . Seizures ([place])    as child; no  medsd and has had no seizures since then.  . Wrist fracture    Left wrist s/p fall and surgical repair    Past Surgical History:  Procedure Laterality Date  . ABDOMINAL HYSTERECTOMY    . CHOLECYSTECTOMY  08/2010   cholelithiasis; Dr Arnoldo Morale  . COLONOSCOPY  01/31/2008   Dr. Gala Romney- normal rectum, L sided diverticula, long tortuous colon. hyperplasitic  polyp.  . COLONOSCOPY WITH PROPOFOL N/A 10/19/2017    single 4 mm polyp in the sigmoid colon which was sessile, scattered small mouth diverticula in the sigmoid and descending colon, otherwise normal. Surgical pathology found the polyp to be hyperplastic.Recommended 10-year repeat colonoscopy.  Marland Kitchen ERCP W/ SPHINCTEROTOMY AND BALLOON DILATION  08/05/2010   normal ampulla/mild erythema in the gastric remnant without ulcerations or erosions. normal retroflexed view of the cardia/anastomosis normal/distal common bile duct stones s/p extraction and shincterotomy  . ESOPHAGOGASTRODUODENOSCOPY N/A 10/24/2013   OAC:ZYSAYT esophagus. Surgically altered stomach. Abnormal gastric mucosa  -  status post biopsy (reactive gastrophathy)  . ESOPHAGOGASTRODUODENOSCOPY (EGD) WITH PROPOFOL N/A 11/30/2015   LA grade A esophagitis, no varices, surveillance in 2019  . ESOPHAGOGASTRODUODENOSCOPY (EGD) WITH PROPOFOL N/A 10/19/2017   erosive reflux esophagitis status post dilation, surgically altered stomach status post gastric bypass with a single patent apparent small bowel limb, small hiatal hernia.  . EUS N/A 08/20/2015   Dr. Ardis Hughs: well-circumscribed 5.2 cm chronic mass in porta hepatis, internally mass is gelatinous. Small distal esophageal varices (limited  view), Bilroth 1 type anatomy, cytology NEGATIVE for malignant cells. Suspicion for neoplasm low   . FOOT SURGERY     left  . GASTRIC BYPASS     with Revision, Dr. Duffy Rhody  . MALONEY DILATION N/A 10/19/2017   Procedure: Venia Minks DILATION;  Surgeon: Daneil Dolin, MD;  [facility];  Service:  Endoscopy;  Laterality: N/A;  . POLYPECTOMY  10/19/2017   Procedure: POLYPECTOMY;  Surgeon: Daneil Dolin, MD;  [facility];  Service: Endoscopy;;  sigmoid colon (CS)   . REDUCTION MAMMAPLASTY Bilateral   . S/P Hysterectomy     partial  . TONSILLECTOMY    . WRIST SURGERY Left 05/2016    Current Outpatient Medications  Medication Sig Dispense Refill  . albuterol (VENTOLIN HFA) 108 (90 Base) MCG/ACT inhaler Inhale 1 puff into the lungs every 6 (six) hours as needed.    Marland Kitchen ELIQUIS 5 MG TABS tablet Take 1 tablet by mouth 2 (two) times daily.    Marland Kitchen FLUoxetine (PROZAC) 40 MG capsule Take 40 mg by mouth daily.    . pantoprazole (PROTONIX) 40 MG tablet Take 1 tab by mouth daily 30 tablet 11  . risperiDONE (RISPERDAL) 3 MG tablet Take 3 mg by mouth at bedtime.     . traZODone (DESYREL) 150 MG tablet Take 300 mg by mouth at bedtime.     No current facility-administered medications for this visit.    Allergies as of 12/29/2020 - Review Complete 12/29/2020  Allergen Reaction Noted  . Ciprofloxacin Rash   . Kiwi extract Other (See Comments) 11/25/2015    Family History  Problem Relation Age of Onset  . Diabetes Father   . Colon cancer Maternal Uncle   . Liver cancer Maternal Uncle        49 years old  . Liver disease Neg Hx     Social History   Socioeconomic History  . Marital status: Divorced    Spouse name: Not on file  . Number of children: 2  . Years of education: Not on file  . Highest education level: Not on file  Occupational History  . Occupation: disabled  Tobacco Use  . Smoking status: Current Every Day Smoker    Packs/day: 1.00    Years: 36.00    Pack years: 36.00    Types: Cigarettes    Last attempt to quit: 08/15/2017    Years since quitting: 3.3  . Smokeless tobacco: Never Used  Vaping Use  . Vaping Use: Never used  Substance and Sexual Activity  . Alcohol use: No    Alcohol/week: 0.0 standard drinks  . Drug use: Yes    Types: Marijuana     Comment: 3 joints/day  . Sexual activity: Never  Other Topics Concern  . Not on file  Social History Narrative   Lives w/ mother   2 grown children   Social Determinants of Health   Financial Resource Strain: Not on file  Food Insecurity: Not on file  Transportation Needs: Not on file  Physical Activity: Not on file  Stress: Not on file  Social Connections: Not on file    Review of Systems: Gen: Denies fever, chills, anorexia. Denies fatigue, weakness, weight loss.  CV: Denies chest pain, palpitations, syncope, peripheral edema, and claudication. Resp: Denies dyspnea at rest, cough, wheezing, coughing up blood, and pleurisy. GI: see HPI Derm: Denies rash, itching, dry skin Psych: Denies depression, anxiety, memory loss, confusion. No homicidal or suicidal ideation.  Heme: Denies  bruising, bleeding, and enlarged lymph nodes.  Physical Exam: BP 118/71   Pulse 73   Temp (!) 96.8 F (36 C) (Temporal)   Ht 5\' 4"  (1.626 m)   Wt 180 lb 12.8 oz (82 kg)   BMI 31.03 kg/m  General:   Alert and oriented. No distress noted. Pleasant and cooperative.  Head:  Normocephalic and atraumatic. Eyes:  Conjuctiva clear without scleral icterus. Mouth:  Mask in place Abdomen:  +BS, soft, non-tender and non-distended. No rebound or guarding. No HSM Msk:  Symmetrical without gross deformities. Normal posture. Extremities:  Without edema. Neurologic:  Alert and  oriented x4 Psych:  Alert and cooperative. Normal mood and affect.  ASSESSMENT: [name] is a 65 y.o. female presenting today with a history of cirrhosis due to NASH (MELD Na 7 recently) and well-compensated, chronic GERD, constipation, for routine follow-up.  GERD: remains well-controlled on Protonix once daily.  Constipation: no prescriptive agents needed. Denies currently.  Cirrhosis: remaining well-compensated. Last EGD in 2019 without varices. She is due now but is on Eliquis due to DVT in Dec 2021. Plans for coming  off shortly. We will tentatively hold a space for her but ensure she is off Eliquis prior to this procedure as it is elective. RUQ [place] in July 2022.  Right lower lobe area of peripheral ground-glass opacity, ?infectious or inflammatory on prior imaging. We have requested CXR multiple times but has not completed. I have requested she do this walk-in appt.   PLAN:  RUQ [place] in July 2022 CXR walk-in appt Continue Protonix Tentatively plan on EGD with Dr. Gala Romney using Propofol around July; risks and benefits discussed with stated understanding. Hopefully, she will be off Eliquis at that time. If not, we will need to postpone.  Return in 6 months. Labs at that time   Annitta Needs, PhD, ANP-BC [facility] Gastroenterology

## 2020-12-29 NOTE — Patient Instructions (Addendum)
We will arrange an upper endoscopy when you are off Eliquis. I would like to tentatively hold a spot for you in the meantime. However, we can't do the procedure until you are off this medication.  Your next ultrasound in July 2022.  Please complete the chest xray. This is a walk-in appointment.  We will see you in 6 months!  I enjoyed seeing you again today! As you know, I value our relationship and want to provide genuine, compassionate, and quality care. I welcome your feedback. If you receive a survey regarding your visit,  I greatly appreciate you taking time to fill this out. See you next time!  Annitta Needs, PhD, ANP-BC [facility] Gastroenterology

## 2021-02-08 ENCOUNTER — Telehealth: Payer: Self-pay

## 2021-02-08 NOTE — Telephone Encounter (Signed)
Christine Kaufman NP wants pt to have EGD scheduled but at last OV she was possibly going to be coming off Eliquis.  Tried to call pt to see if she is still on Eliquis, no answer.

## 2021-02-10 NOTE — Telephone Encounter (Signed)
Letter mailed

## 2021-02-19 NOTE — Telephone Encounter (Signed)
Pt called office, she is still on Eliquis and unsure when she will be stopping. Dr. Gala Romney doesn't currently have any openings for procedures. Advised her we will call her later when future schedule is available.

## 2021-02-26 DIAGNOSIS — I82401 Acute embolism and thrombosis of unspecified deep veins of right lower extremity: Secondary | ICD-10-CM | POA: Diagnosis not present

## 2021-03-02 ENCOUNTER — Telehealth: Payer: Self-pay | Admitting: Internal Medicine

## 2021-03-02 DIAGNOSIS — F17210 Nicotine dependence, cigarettes, uncomplicated: Secondary | ICD-10-CM | POA: Insufficient documentation

## 2021-03-02 DIAGNOSIS — H6123 Impacted cerumen, bilateral: Secondary | ICD-10-CM | POA: Diagnosis not present

## 2021-03-02 DIAGNOSIS — Z86718 Personal history of other venous thrombosis and embolism: Secondary | ICD-10-CM | POA: Insufficient documentation

## 2021-03-02 DIAGNOSIS — R0600 Dyspnea, unspecified: Secondary | ICD-10-CM | POA: Diagnosis not present

## 2021-03-02 NOTE — Telephone Encounter (Signed)
Recall mailed 

## 2021-03-02 NOTE — Telephone Encounter (Signed)
RECALL FOR ULTRASOUND 

## 2021-03-11 ENCOUNTER — Other Ambulatory Visit (HOSPITAL_COMMUNITY): Payer: Self-pay | Admitting: Radiology

## 2021-03-11 DIAGNOSIS — Z86718 Personal history of other venous thrombosis and embolism: Secondary | ICD-10-CM

## 2021-03-11 DIAGNOSIS — F17210 Nicotine dependence, cigarettes, uncomplicated: Secondary | ICD-10-CM

## 2021-03-11 DIAGNOSIS — H6123 Impacted cerumen, bilateral: Secondary | ICD-10-CM

## 2021-03-11 DIAGNOSIS — R0600 Dyspnea, unspecified: Secondary | ICD-10-CM

## 2021-03-15 ENCOUNTER — Telehealth: Payer: Self-pay

## 2021-03-15 DIAGNOSIS — K7460 Unspecified cirrhosis of liver: Secondary | ICD-10-CM

## 2021-03-15 NOTE — Telephone Encounter (Signed)
Pt called office last week and LMOVM to schedule [place] abd RUQ. She also stopped Eliquis 03/02/21.  [place] abd RUQ scheduled for 03/19/21 at 9:30am, arrive at 9:15am. NPO after midnight prior to test.  Called and informed pt of [place] appt.  Vicente Males, is it ok to proceed with scheduling EGD w/Propofol ASA 2 w/Dr. Gala Romney since she has came off Eliquis? Currently awaiting Dr. Roseanne Kaufman September schedule for ASA 2 propofol cases.

## 2021-03-16 NOTE — Telephone Encounter (Signed)
As it's not urgent, have her keep her appt for October with me. It would be better to have an updated office visit prior to scheduling, as too much time will have passed between time I saw her (April) and when procedure is done. We can arrange screening EGD at that visit. She will need to be a priority, and if we can hold a tentative date that would be helpful.

## 2021-03-16 NOTE — Telephone Encounter (Signed)
Called and informed pt of Anna's recommendation.  Vicente Males, unable to hold spot for procedure as we only have doctor's schedule through August at this time.

## 2021-03-19 ENCOUNTER — Other Ambulatory Visit: Payer: Self-pay

## 2021-03-19 ENCOUNTER — Ambulatory Visit (HOSPITAL_COMMUNITY)
Admission: RE | Admit: 2021-03-19 | Discharge: 2021-03-19 | Disposition: A | Discharge status: Home / Self Care | Payer: 59 | Source: Ambulatory Visit | Attending: Gastroenterology | Admitting: Gastroenterology

## 2021-03-19 DIAGNOSIS — K7460 Unspecified cirrhosis of liver: Secondary | ICD-10-CM | POA: Diagnosis present

## 2021-04-26 ENCOUNTER — Telehealth: Payer: Self-pay | Admitting: Internal Medicine

## 2021-04-26 DIAGNOSIS — K219 Gastro-esophageal reflux disease without esophagitis: Secondary | ICD-10-CM

## 2021-04-26 NOTE — Telephone Encounter (Signed)
Please verify with patient if she is wanting this refill from [facility]. We sent her one year supply to [facility] in 11/2020 and should have adequate refills.

## 2021-04-26 NOTE — Telephone Encounter (Signed)
Pt does not want rx sent to [facility].

## 2021-04-26 NOTE — Telephone Encounter (Signed)
noted 

## 2021-04-26 NOTE — Telephone Encounter (Signed)
Refill request received from [facility] for 90 day supply and refills on Protonix 40 mg tab.

## 2021-05-11 ENCOUNTER — Other Ambulatory Visit: Payer: Self-pay

## 2021-05-11 DIAGNOSIS — K219 Gastro-esophageal reflux disease without esophagitis: Secondary | ICD-10-CM

## 2021-05-11 MED ORDER — PANTOPRAZOLE SODIUM 40 MG PO TBEC: 40.0000 mg | DELAYED_RELEASE_TABLET | Freq: Every day | ORAL | 11 refills | Status: DC

## 2021-06-30 ENCOUNTER — Encounter: Payer: Self-pay | Admitting: Gastroenterology

## 2021-06-30 ENCOUNTER — Telehealth: Payer: Self-pay

## 2021-06-30 ENCOUNTER — Other Ambulatory Visit: Payer: Self-pay

## 2021-06-30 ENCOUNTER — Ambulatory Visit (INDEPENDENT_AMBULATORY_CARE_PROVIDER_SITE_OTHER): Payer: 59 | Admitting: Gastroenterology

## 2021-06-30 VITALS — BP 115/72 | HR 67 | Temp 96.9°F | Ht 64.0 in | Wt 202.0 lb

## 2021-06-30 DIAGNOSIS — K7460 Unspecified cirrhosis of liver: Secondary | ICD-10-CM | POA: Diagnosis not present

## 2021-06-30 DIAGNOSIS — K219 Gastro-esophageal reflux disease without esophagitis: Secondary | ICD-10-CM | POA: Diagnosis not present

## 2021-06-30 NOTE — Progress Notes (Signed)
Referring Provider: Alanson Puls, [facility] Primary Care Physician:  Pll[facility], [facility] Primary GI: Dr. Gala Romney   Chief Complaint  Patient presents with   Gastroesophageal Reflux    Doing ok   Cirrhosis    HPI:   [name] is a 65 y.o. female presenting today with a history of  cirrhosis due to  NASH, GERD, constipation, abnormal CT in 2018 with peripancreatic soft tissue density favored to represent upper normal gastrohepatic ligament node. Non-specific left hepatic lobe low-density lesion present that was noted in 2017. CT in July 2021 due to RLQ  without IV contrast as unable to obtain IV after multiple attempts, without acute findings. Non-obstructive known bowel-containing ventral hernia.  Right lower lobe area of peripheral ground-glass opacity, ?infectious or inflammatory. She was to have a CXR but did not complete this.  Overdue for EGD for variceal screening, as last was in 2019. History of popliteal vein DVT Dec 2021 on Eliquis previously. Now off of Eliquis.   States for past 3-4 months has about 2 BMs per day, "chunky". [facility] stool scale #5. No confusion or mental status changes. No overt GI bleeding. No jaundice or pruritis. No abdominal pain. Takes Protonix once daily. Keeps forgetting to complete CXR.   Past Medical History:  Diagnosis Date   Chronic constipation    Cirrhosis ([place])    child pugh class A and MELD 5, completed hep A and B vaccine series, no HCC on 3/16 MRI   Colonic inertia    Depression    Diverticula of colon    Diverticulosis 01/31/2008   colonoscopy Dr Gala Romney   GERD (gastroesophageal reflux disease)    Hyperplastic colon polyp    Liver mass    Macular degeneration    Obesity    PUD (peptic ulcer disease) 1988   Schizoaffective disorder    Mental Health-Dr Dekle   Seizures ([place])    as child; no medsd and has had no seizures since then.   Wrist fracture    Left wrist s/p fall and surgical repair    Past Surgical History:   Procedure Laterality Date   ABDOMINAL HYSTERECTOMY     CHOLECYSTECTOMY  08/2010   cholelithiasis; Dr Arnoldo Morale   COLONOSCOPY  01/31/2008   Dr. Gala Romney- normal rectum, L sided diverticula, long tortuous colon. hyperplasitic  polyp.   COLONOSCOPY WITH PROPOFOL N/A 10/19/2017    single 4 mm polyp in the sigmoid colon which was sessile, scattered small mouth diverticula in the sigmoid and descending colon, otherwise normal.  Surgical pathology found the polyp to be hyperplastic.  Recommended 10-year repeat colonoscopy.   ERCP W/ SPHINCTEROTOMY AND BALLOON DILATION  08/05/2010   normal ampulla/mild erythema in the gastric remnant without ulcerations or erosions. normal retroflexed view of the cardia/anastomosis normal/distal common bile duct stones s/p extraction and shincterotomy   ESOPHAGOGASTRODUODENOSCOPY N/A 10/24/2013   OIN:OMVEHM esophagus. Surgically altered stomach. Abnormal gastric mucosa  -  status post biopsy (reactive gastrophathy)   ESOPHAGOGASTRODUODENOSCOPY (EGD) WITH PROPOFOL N/A 11/30/2015   LA grade A esophagitis, no varices, surveillance in 2019   ESOPHAGOGASTRODUODENOSCOPY (EGD) WITH PROPOFOL N/A 10/19/2017   erosive reflux esophagitis status post dilation, surgically altered stomach status post gastric bypass with a single patent apparent small bowel limb, small hiatal hernia.   EUS N/A 08/20/2015   Dr. Ardis Hughs: well-circumscribed 5.2 cm chronic mass in porta hepatis, internally mass is gelatinous. Small distal esophageal varices (limited view), Bilroth 1 type anatomy, cytology NEGATIVE for malignant cells.  Suspicion for neoplasm low    FOOT SURGERY     left   GASTRIC BYPASS     with Revision, Dr. Welton Flakes DILATION N/A 10/19/2017   Procedure: Venia Minks DILATION;  Surgeon: Daneil Dolin, MD;  [facility];  Service: Endoscopy;  Laterality: N/A;   POLYPECTOMY  10/19/2017   Procedure: POLYPECTOMY;  Surgeon: Daneil Dolin, MD;  [facility];  Service:  Endoscopy;;  sigmoid colon (CS)    REDUCTION MAMMAPLASTY Bilateral    S/P Hysterectomy     partial   TONSILLECTOMY     WRIST SURGERY Left 05/2016    Current Outpatient Medications  Medication Sig Dispense Refill   albuterol (VENTOLIN HFA) 108 (90 Base) MCG/ACT inhaler Inhale 1 puff into the lungs every 6 (six) hours as needed.     FLUoxetine (PROZAC) 40 MG capsule Take 40 mg by mouth daily.     pantoprazole (PROTONIX) 40 MG tablet Take 1 tablet (40 mg total) by mouth daily. 30 tablet 11   risperiDONE (RISPERDAL) 3 MG tablet Take 3 mg by mouth at bedtime.      SYMBICORT 80-4.5 MCG/ACT inhaler Inhale 2 puffs into the lungs in the morning and at bedtime.     traZODone (DESYREL) 150 MG tablet Take 300 mg by mouth at bedtime.     No current facility-administered medications for this visit.    Allergies as of 06/30/2021 - Review Complete 06/30/2021  Allergen Reaction Noted   Ciprofloxacin Rash    Kiwi extract Other (See Comments) 11/25/2015    Family History  Problem Relation Age of Onset   Diabetes Father    Colon cancer Maternal Uncle    Liver cancer Maternal Uncle        12 years old   Liver disease Neg Hx     Social History   Socioeconomic History   Marital status: Divorced    Spouse name: Not on file   Number of children: 2   Years of education: Not on file   Highest education level: Not on file  Occupational History   Occupation: disabled  Tobacco Use   Smoking status: Every Day    Packs/day: 1.00    Years: 36.00    Pack years: 36.00    Types: Cigarettes    Last attempt to quit: 08/15/2017    Years since quitting: 3.8   Smokeless tobacco: Never  Vaping Use   Vaping Use: Never used  Substance and Sexual Activity   Alcohol use: No    Alcohol/week: 0.0 standard drinks   Drug use: Yes    Types: Marijuana    Comment: 3 joints/day   Sexual activity: Never  Other Topics Concern   Not on file  Social History Narrative   Lives w/ mother   2 grown children    Social Determinants of Health   Financial Resource Strain: Not on file  Food Insecurity: Not on file  Transportation Needs: Not on file  Physical Activity: Not on file  Stress: Not on file  Social Connections: Not on file    Review of Systems: Gen: Denies fever, chills, anorexia. Denies fatigue, weakness, weight loss.  CV: Denies chest pain, palpitations, syncope, peripheral edema, and claudication. Resp: Denies dyspnea at rest, cough, wheezing, coughing up blood, and pleurisy. GI: see HPI Derm: Denies rash, itching, dry skin Psych: Denies depression, anxiety, memory loss, confusion. No homicidal or suicidal ideation.  Heme: Denies bruising, bleeding, and enlarged lymph nodes.  Physical  Exam: BP 115/72   Pulse 67   Temp (!) 96.9 F (36.1 C) (Temporal)   Ht 5\' 4"  (1.626 m)   Wt 202 lb (91.6 kg)   BMI 34.67 kg/m  General:   Alert and oriented. No distress noted. Pleasant and cooperative.  Head:  Normocephalic and atraumatic. Eyes:  Conjuctiva clear without scleral icterus. Mouth:  mask in place Lungs: mild expiratory wheeze bilaterally Cardiac: S1 S2 present without murmurs Abdomen:  +BS, soft, non-tender and non-distended. No rebound or guarding. No HSM or masses noted. Msk:  Symmetrical without gross deformities. Normal posture. Extremities:  Without edema. Neurologic:  Alert and  oriented x4 Psych:  Alert and cooperative. Normal mood and affect.  ASSESSMENT: MYRKA SYLVA is a 65 y.o. female presenting today with a history of  cirrhosis due to  NASH, GERD, constipation, and overdue for variceal screening.   Cirrhosis: has overall remained well-compensated. No concerning signs today. Routine labs ordered. Next [place] due in Jan 2022. Will also arrange EGD for variceal screening.  GERD: well-controlled on Protonix daily. No alarm signs/symptoms.    PLAN:  Proceed with upper endoscopy by Dr. Gala Romney in near future using Propofol: the risks, benefits, and alternatives  have been discussed with the patient in detail. The patient states understanding and desires to proceed.   Continue Protonix daily  CBC, CMP, INR ordered today  Follow-up in 6 months   Annitta Needs, PhD, ANP-BC [facility] Gastroenterology

## 2021-06-30 NOTE — Telephone Encounter (Signed)
PA for EGD submitted via [facility] website. PA# [id number], valid 11/111/22-12/31/22.

## 2021-06-30 NOTE — Patient Instructions (Signed)
We are arranging an upper endoscopy in the near future with Dr. Gala Romney.  Your next ultrasound will be due in January 2023.  I recommend getting the chest xray if you remember. It's a walk in appointment!  I have ordered labs to be completed at [place].  We will see you in 6 months!  I enjoyed seeing you again today! As you know, I value our relationship and want to provide genuine, compassionate, and quality care. I welcome your feedback. If you receive a survey regarding your visit,  I greatly appreciate you taking time to fill this out. See you next time!  Annitta Needs, PhD, ANP-BC [facility] Gastroenterology

## 2021-07-10 LAB — CBC WITH DIFFERENTIAL/PLATELET
Basophils Absolute: 0.1 10*3/uL (ref 0.0–0.2)
Basos: 1 %
EOS (ABSOLUTE): 0.4 10*3/uL (ref 0.0–0.4)
Eos: 4 %
Hematocrit: 43.5 % (ref 34.0–46.6)
Hemoglobin: 14.1 g/dL (ref 11.1–15.9)
Immature Grans (Abs): 0 10*3/uL (ref 0.0–0.1)
Immature Granulocytes: 0 %
Lymphocytes Absolute: 1.8 10*3/uL (ref 0.7–3.1)
Lymphs: 18 %
MCH: 30.5 pg (ref 26.6–33.0)
MCHC: 32.4 g/dL (ref 31.5–35.7)
MCV: 94 fL (ref 79–97)
Monocytes Absolute: 1 10*3/uL — ABNORMAL HIGH (ref 0.1–0.9)
Monocytes: 10 %
Neutrophils Absolute: 6.6 10*3/uL (ref 1.4–7.0)
Neutrophils: 67 %
Platelets: 360 10*3/uL (ref 150–450)
RBC: 4.62 x10E6/uL (ref 3.77–5.28)
RDW: 12.6 % (ref 11.7–15.4)
WBC: 9.8 10*3/uL (ref 3.4–10.8)

## 2021-07-10 LAB — PROTIME-INR
INR: 1 (ref 0.9–1.2)
Prothrombin Time: 10.1 s (ref 9.1–12.0)

## 2021-07-12 NOTE — Patient Instructions (Signed)
[name]  07/12/2021     @PREFPERIOPPHARMACY @   Your procedure is scheduled on  07/16/2021.   Report to [facility] at  1245  P.M.   Call this number if you have problems the morning of surgery:  [phone]   Remember:  Follow the diet instructions given to you by the office.    Use your inhaler before you coe and bring your rescue inhaler with you.    Take these medicines the morning of surgery with A SIP OF WATER                                     prozac, protonix.      Do not wear jewelry, make-up or nail polish.  Do not wear lotions, powders, or perfumes, or deodorant.  Do not shave 48 hours prior to surgery.  Men may shave face and neck.  Do not bring valuables to the hospital.  [facility] is not responsible for any belongings or valuables.  Contacts, dentures or bridgework may not be worn into surgery.  Leave your suitcase in the car.  After surgery it may be brought to your room.  For patients admitted to the hospital, discharge time will be determined by your treatment team.  Patients discharged the day of surgery will not be allowed to drive home and must have someone with them for 24 hours.    Special instructions:   DO NOT smoke tobacco or vape for 24 hours before your procedure.  Please read over the following fact sheets that you were given. Anesthesia Post-op Instructions and Care and Recovery After Surgery      Upper Endoscopy, Adult, Care After This sheet gives you information about how to care for yourself after your procedure. Your health care provider may also give you more specific instructions. If you have problems or questions, contact your health care provider. What can I expect after the procedure? After the procedure, it is common to have: A sore throat. Mild stomach pain or discomfort. Bloating. Nausea. Follow these instructions at home:  Follow instructions from your health care provider about what to eat or drink  after your procedure. Return to your normal activities as told by your health care provider. Ask your health care provider what activities are safe for you. Take over-the-counter and prescription medicines only as told by your health care provider. If you were given a sedative during the procedure, it can affect you for several hours. Do not drive or operate machinery until your health care provider says that it is safe. Keep all follow-up visits as told by your health care provider. This is important. Contact a health care provider if you have: A sore throat that lasts longer than one day. Trouble swallowing. Get help right away if: You vomit blood or your vomit looks like coffee grounds. You have: A fever. Bloody, black, or tarry stools. A severe sore throat or you cannot swallow. Difficulty breathing. Severe pain in your chest or abdomen. Summary After the procedure, it is common to have a sore throat, mild stomach discomfort, bloating, and nausea. If you were given a sedative during the procedure, it can affect you for several hours. Do not drive or operate machinery until your health care provider says that it is safe. Follow instructions from your health care provider about what to eat or drink after your  procedure. Return to your normal activities as told by your health care provider. This information is not intended to replace advice given to you by your health care provider. Make sure you discuss any questions you have with your health care provider. Document Revised: 06/28/2019 Document Reviewed: 01/22/2018 Elsevier Patient Education  2022 [place] After This sheet gives you information about how to care for yourself after your procedure. Your health care provider may also give you more specific instructions. If you have problems or questions, contact your health care provider. What can I expect after the procedure? After the procedure, it is  common to have: Tiredness. Forgetfulness about what happened after the procedure. Impaired judgment for important decisions. Nausea or vomiting. Some difficulty with balance. Follow these instructions at home: For the time period you were told by your health care provider:   Rest as needed. Do not participate in activities where you could fall or become injured. Do not drive or use machinery. Do not drink alcohol. Do not take sleeping pills or medicines that cause drowsiness. Do not make important decisions or sign legal documents. Do not take care of children on your own. Eating and drinking Follow the diet that is recommended by your health care provider. Drink enough fluid to keep your urine pale yellow. If you vomit: Drink water, juice, or soup when you can drink without vomiting. Make sure you have little or no nausea before eating solid foods. General instructions Have a responsible adult stay with you for the time you are told. It is important to have someone help care for you until you are awake and alert. Take over-the-counter and prescription medicines only as told by your health care provider. If you have sleep apnea, surgery and certain medicines can increase your risk for breathing problems. Follow instructions from your health care provider about wearing your sleep device: Anytime you are sleeping, including during daytime naps. While taking prescription pain medicines, sleeping medicines, or medicines that make you drowsy. Avoid smoking. Keep all follow-up visits as told by your health care provider. This is important. Contact a health care provider if: You keep feeling nauseous or you keep vomiting. You feel light-headed. You are still sleepy or having trouble with balance after 24 hours. You develop a rash. You have a fever. You have redness or swelling around the IV site. Get help right away if: You have trouble breathing. You have new-onset confusion at  home. Summary For several hours after your procedure, you may feel tired. You may also be forgetful and have poor judgment. Have a responsible adult stay with you for the time you are told. It is important to have someone help care for you until you are awake and alert. Rest as told. Do not drive or operate machinery. Do not drink alcohol or take sleeping pills. Get help right away if you have trouble breathing, or if you suddenly become confused. This information is not intended to replace advice given to you by your health care provider. Make sure you discuss any questions you have with your health care provider. Document Revised: 05/07/2020 Document Reviewed: 07/25/2019 Elsevier Patient Education  2022 [employer].

## 2021-07-14 ENCOUNTER — Encounter (HOSPITAL_COMMUNITY)
Admission: RE | Admit: 2021-07-14 | Discharge: 2021-07-14 | Disposition: A | Discharge status: Home / Self Care | Payer: 59 | Source: Ambulatory Visit | Attending: Internal Medicine | Admitting: Internal Medicine

## 2021-07-14 ENCOUNTER — Encounter (HOSPITAL_COMMUNITY): Payer: Self-pay

## 2021-07-14 ENCOUNTER — Other Ambulatory Visit: Payer: Self-pay

## 2021-07-14 VITALS — BP 119/61 | HR 60 | Temp 98.2°F | Resp 18 | Ht 64.0 in | Wt 200.0 lb

## 2021-07-14 DIAGNOSIS — Z01818 Encounter for other preprocedural examination: Secondary | ICD-10-CM | POA: Diagnosis present

## 2021-07-14 DIAGNOSIS — K7460 Unspecified cirrhosis of liver: Secondary | ICD-10-CM | POA: Insufficient documentation

## 2021-07-14 LAB — COMPREHENSIVE METABOLIC PANEL
ALT: 14 U/L (ref 0–44)
AST: 14 U/L — ABNORMAL LOW (ref 15–41)
Albumin: 3.7 g/dL (ref 3.5–5.0)
Alkaline Phosphatase: 82 U/L (ref 38–126)
Anion gap: 8 (ref 5–15)
BUN: 10 mg/dL (ref 8–23)
CO2: 26 mmol/L (ref 22–32)
Calcium: 8.6 mg/dL — ABNORMAL LOW (ref 8.9–10.3)
Chloride: 104 mmol/L (ref 98–111)
Creatinine, Ser: 1.22 mg/dL — ABNORMAL HIGH (ref 0.44–1.00)
GFR, Estimated: 49 mL/min — ABNORMAL LOW (ref >60)
Glucose, Bld: 92 mg/dL (ref 70–99)
Potassium: 4 mmol/L (ref 3.5–5.1)
Sodium: 138 mmol/L (ref 135–145)
Total Bilirubin: 0.4 mg/dL (ref 0.3–1.2)
Total Protein: 6.6 g/dL (ref 6.5–8.1)

## 2021-07-16 ENCOUNTER — Encounter (HOSPITAL_COMMUNITY): Payer: Self-pay | Admitting: Internal Medicine

## 2021-07-16 ENCOUNTER — Ambulatory Visit (HOSPITAL_COMMUNITY)
Admission: RE | Admit: 2021-07-16 | Discharge: 2021-07-16 | Disposition: A | Discharge status: Home / Self Care | Payer: 59 | Attending: Internal Medicine | Admitting: Internal Medicine

## 2021-07-16 ENCOUNTER — Ambulatory Visit (HOSPITAL_COMMUNITY): Payer: 59 | Admitting: Anesthesiology

## 2021-07-16 ENCOUNTER — Encounter (HOSPITAL_COMMUNITY)
Admission: RE | Disposition: A | Discharge status: Home / Self Care | Payer: Self-pay | Source: Home / Self Care | Attending: Internal Medicine

## 2021-07-16 DIAGNOSIS — Z8711 Personal history of peptic ulcer disease: Secondary | ICD-10-CM | POA: Diagnosis not present

## 2021-07-16 DIAGNOSIS — K7581 Nonalcoholic steatohepatitis (NASH): Secondary | ICD-10-CM | POA: Diagnosis not present

## 2021-07-16 DIAGNOSIS — J449 Chronic obstructive pulmonary disease, unspecified: Secondary | ICD-10-CM | POA: Insufficient documentation

## 2021-07-16 DIAGNOSIS — K219 Gastro-esophageal reflux disease without esophagitis: Secondary | ICD-10-CM | POA: Diagnosis not present

## 2021-07-16 DIAGNOSIS — Z13810 Encounter for screening for upper gastrointestinal disorder: Secondary | ICD-10-CM | POA: Diagnosis present

## 2021-07-16 DIAGNOSIS — F32A Depression, unspecified: Secondary | ICD-10-CM | POA: Diagnosis not present

## 2021-07-16 DIAGNOSIS — K766 Portal hypertension: Secondary | ICD-10-CM | POA: Insufficient documentation

## 2021-07-16 DIAGNOSIS — K3189 Other diseases of stomach and duodenum: Secondary | ICD-10-CM | POA: Diagnosis not present

## 2021-07-16 DIAGNOSIS — K7460 Unspecified cirrhosis of liver: Secondary | ICD-10-CM | POA: Diagnosis not present

## 2021-07-16 DIAGNOSIS — F17210 Nicotine dependence, cigarettes, uncomplicated: Secondary | ICD-10-CM | POA: Diagnosis not present

## 2021-07-16 HISTORY — PX: ESOPHAGOGASTRODUODENOSCOPY (EGD) WITH PROPOFOL: SHX5813

## 2021-07-16 SURGERY — ESOPHAGOGASTRODUODENOSCOPY (EGD) WITH PROPOFOL
Anesthesia: General

## 2021-07-16 MED ORDER — LIDOCAINE HCL (CARDIAC) PF 100 MG/5ML IV SOSY
PREFILLED_SYRINGE | INTRAVENOUS | Status: DC | PRN
  Administered 2021-07-16: 100 mg via INTRAVENOUS

## 2021-07-16 MED ORDER — PROPOFOL 500 MG/50ML IV EMUL
INTRAVENOUS | Status: AC
  Filled 2021-07-16: qty 100

## 2021-07-16 MED ORDER — PROPOFOL 10 MG/ML IV BOLUS
INTRAVENOUS | Status: DC | PRN
  Administered 2021-07-16: 50 mg via INTRAVENOUS
  Administered 2021-07-16: 150 mg via INTRAVENOUS

## 2021-07-16 MED ORDER — LACTATED RINGERS IV SOLN: INTRAVENOUS | Status: DC

## 2021-07-16 MED ORDER — IPRATROPIUM-ALBUTEROL 0.5-2.5 (3) MG/3ML IN SOLN: 3.0000 mL | Freq: Once | RESPIRATORY_TRACT | Status: AC

## 2021-07-16 MED ORDER — PROPOFOL 10 MG/ML IV BOLUS
INTRAVENOUS | Status: AC
  Filled 2021-07-16: qty 20

## 2021-07-16 MED ORDER — LIDOCAINE HCL (PF) 2 % IJ SOLN
INTRAMUSCULAR | Status: AC
  Filled 2021-07-16: qty 5

## 2021-07-16 MED ORDER — LACTATED RINGERS IV SOLN: INTRAVENOUS | Status: DC | PRN

## 2021-07-16 MED ORDER — IPRATROPIUM-ALBUTEROL 0.5-2.5 (3) MG/3ML IN SOLN
RESPIRATORY_TRACT | Status: AC
  Administered 2021-07-16: 3 mL via RESPIRATORY_TRACT
  Filled 2021-07-16: qty 3

## 2021-07-16 NOTE — Anesthesia Preprocedure Evaluation (Addendum)
Anesthesia Evaluation  Patient identified by MRN, date of birth, ID band Patient awake    Reviewed: Allergy & Precautions, NPO status , Patient's Chart, lab work & pertinent test results  History of Anesthesia Complications Negative for: history of anesthetic complications  Airway Mallampati: II  TM Distance: >3 FB Neck ROM: Full    Dental  (+) Chipped, Dental Advisory Given   Pulmonary COPD,  COPD inhaler, Current Smoker,     + wheezing      Cardiovascular Exercise Tolerance: Good Normal cardiovascular exam Rhythm:Regular Rate:Normal     Neuro/Psych Seizures -, Well Controlled,  PSYCHIATRIC DISORDERS Depression Schizophrenia    GI/Hepatic PUD, GERD  Medicated and Controlled,(+) Cirrhosis       ,   Endo/Other  negative endocrine ROS  Renal/GU negative Renal ROS     Musculoskeletal negative musculoskeletal ROS (+)   Abdominal   Peds  Hematology negative hematology ROS (+)   Anesthesia Other Findings   Reproductive/Obstetrics negative OB ROS                            Anesthesia Physical Anesthesia Plan  ASA: 3  Anesthesia Plan: General   Post-op Pain Management:    Induction: Intravenous  PONV Risk Score and Plan: TIVA  Airway Management Planned: Nasal Cannula and Natural Airway  Additional Equipment:   Intra-op Plan:   Post-operative Plan:   Informed Consent: I have reviewed the patients History and Physical, chart, labs and discussed the procedure including the risks, benefits and alternatives for the proposed anesthesia with the patient or authorized representative who has indicated his/her understanding and acceptance.     Dental advisory given  Plan Discussed with: CRNA and Surgeon  Anesthesia Plan Comments: (Will give duoneb treatment before the procedure.)        Anesthesia Quick Evaluation

## 2021-07-16 NOTE — Op Note (Signed)
[facility] Patient Name: [name] Procedure Date: 07/16/2021 1:53 PM MRN: [id number] Date of Birth: 10-24-1955 Attending MD: Norvel Richards , MD CSN: [id number] Age: 65 Admit Type: Outpatient Procedure:                Upper GI endoscopy Indications:              Screening procedure, Cirrhosis with suspected                            esophageal varices Providers:                Norvel Richards, MD, Lurline Del, RN, Randa Spike, Technician Referring MD:              Medicines:                Propofol per Anesthesia Complications:            No immediate complications. Estimated Blood Loss:     Estimated blood loss: none. Procedure:                After obtaining informed consent, the endoscope was                            passed under direct vision. Throughout the                            procedure, the patient's blood pressure, pulse, and                            oxygen saturations were monitored continuously. The                            GIF-H190 ([id number]) scope was introduced through the                            mouth, and advanced to the efferent jejunal loop.                            The upper GI endoscopy was accomplished without                            difficulty. The patient tolerated the procedure                            well. Scope In: 2:17:51 PM Scope Out: 2:21:55 PM Total Procedure Duration: 0 hours 4 minutes 4 seconds  Findings:      The examined esophagus was normal. Surgically altered stomach. Patulous       pyloric channel patent and normal-appearing efferent small bowel limb      Mild portal hypertensive gastropathy was found in the entire examined       stomach. Impression:               - Normal esophagus. No esophageal varices.                           -  Portal hypertensive gastropathy. Surgically                            altered stomach and small bowel as described                            - No specimens collected. Moderate Sedation:      Moderate (conscious) sedation was personally administered by an       anesthesia professional. The following parameters were monitored: oxygen       saturation, heart rate, blood pressure, respiratory rate, EKG, adequacy       of pulmonary ventilation, and response to care. Recommendation:           - Patient has a contact number available for                            emergencies. The signs and symptoms of potential                            delayed complications were discussed with the                            patient. Return to normal activities tomorrow.                            Written discharge instructions were provided to the                            patient.                           - Advance diet as tolerated.                           - Continue present medications.                           - Repeat upper endoscopy in 3 years for                            surveillance.                           - Return to GI office (date not yet determined). Procedure Code(s):        --- Professional ---                           [phone], Esophagogastroduodenoscopy, flexible,                            transoral; diagnostic, including collection of                            specimen(s) by brushing or washing, when performed                            (separate  procedure) Diagnosis Code(s):        --- Professional ---                           K76.6, Portal hypertension                           K31.89, Other diseases of stomach and duodenum                           Z13.810, Encounter for screening for upper                            gastrointestinal disorder                           K74.60, Unspecified cirrhosis of liver CPT copyright 2019 American Medical Association. All rights reserved. The codes documented in this report are preliminary and upon coder review may  be revised to meet current compliance requirements. [name]. [name], MD Norvel Richards, MD 07/16/2021 2:30:46 PM This report has been signed electronically. Number of Addenda: 0

## 2021-07-16 NOTE — Anesthesia Postprocedure Evaluation (Signed)
Anesthesia Post Note  Patient: [name]  Procedure(s) Performed: ESOPHAGOGASTRODUODENOSCOPY (EGD) WITH PROPOFOL  Patient location during evaluation: Phase II Anesthesia Type: General Level of consciousness: awake and alert and oriented Pain management: pain level controlled Vital Signs Assessment: post-procedure vital signs reviewed and stable Respiratory status: spontaneous breathing, nonlabored ventilation and respiratory function stable Cardiovascular status: blood pressure returned to baseline and stable Postop Assessment: no apparent nausea or vomiting Anesthetic complications: no   No notable events documented.   Last Vitals:  Vitals:   07/16/21 1430 07/16/21 1434  BP:  101/62  Pulse: 69   Resp: 19   Temp: (!) 36.3 C   SpO2: 94%     Last Pain:  Vitals:   07/16/21 1434  TempSrc:   PainSc: 0-No pain                 Nakema Fake C Shaquera Ansley

## 2021-07-16 NOTE — Transfer of Care (Signed)
Immediate Anesthesia Transfer of Care Note  Patient: [name]  Procedure(s) Performed: ESOPHAGOGASTRODUODENOSCOPY (EGD) WITH PROPOFOL  Patient Location: PACU  Anesthesia Type:MAC  Level of Consciousness: awake  Airway & Oxygen Therapy: Patient Spontanous Breathing  Post-op Assessment: Report given to RN and Post -op Vital signs reviewed and stable  Post vital signs: Reviewed and stable  Last Vitals:  Vitals Value Taken Time  BP    Temp    Pulse    Resp    SpO2      Last Pain:  Vitals:   07/16/21 1412  TempSrc:   PainSc: 6          Complications: No notable events documented.

## 2021-07-16 NOTE — H&P (Signed)
@LOGO @   Primary Care Physician:  Pllc, [facility] Primary Gastroenterologist:  Dr. Gala Romney  Pre-Procedure History & Physical: HPI:  [name] is a 65 y.o. female here for   Past Medical History:  Diagnosis Date   Chronic constipation    Cirrhosis ([place])    child pugh class A and MELD 5, completed hep A and B vaccine series, no HCC on 3/16 MRI   Colonic inertia    Depression    Diverticula of colon    Diverticulosis 01/31/2008   colonoscopy Dr Gala Romney   GERD (gastroesophageal reflux disease)    Hyperplastic colon polyp    Liver mass    Macular degeneration    Obesity    PUD (peptic ulcer disease) 1988   Schizoaffective disorder    Mental Health-Dr Dekle   Seizures ([place])    as child; no medsd and has had no seizures since then.   Wrist fracture    Left wrist s/p fall and surgical repair    Past Surgical History:  Procedure Laterality Date   ABDOMINAL HYSTERECTOMY     CHOLECYSTECTOMY  08/2010   cholelithiasis; Dr Arnoldo Morale   COLONOSCOPY  01/31/2008   Dr. Gala Romney- normal rectum, L sided diverticula, long tortuous colon. hyperplasitic  polyp.   COLONOSCOPY WITH PROPOFOL N/A 10/19/2017    single 4 mm polyp in the sigmoid colon which was sessile, scattered small mouth diverticula in the sigmoid and descending colon, otherwise normal.  Surgical pathology found the polyp to be hyperplastic.  Recommended 10-year repeat colonoscopy.   ERCP W/ SPHINCTEROTOMY AND BALLOON DILATION  08/05/2010   normal ampulla/mild erythema in the gastric remnant without ulcerations or erosions. normal retroflexed view of the cardia/anastomosis normal/distal common bile duct stones s/p extraction and shincterotomy   ESOPHAGOGASTRODUODENOSCOPY N/A 10/24/2013   KWI:OXBDZH esophagus. Surgically altered stomach. Abnormal gastric mucosa  -  status post biopsy (reactive gastrophathy)   ESOPHAGOGASTRODUODENOSCOPY (EGD) WITH PROPOFOL N/A 11/30/2015   LA grade A esophagitis, no varices, surveillance in 2019    ESOPHAGOGASTRODUODENOSCOPY (EGD) WITH PROPOFOL N/A 10/19/2017   erosive reflux esophagitis status post dilation, surgically altered stomach status post gastric bypass with a single patent apparent small bowel limb, small hiatal hernia.   EUS N/A 08/20/2015   Dr. Ardis Hughs: well-circumscribed 5.2 cm chronic mass in porta hepatis, internally mass is gelatinous. Small distal esophageal varices (limited view), Bilroth 1 type anatomy, cytology NEGATIVE for malignant cells. Suspicion for neoplasm low    FOOT SURGERY     left   GASTRIC BYPASS     with Revision, Dr. Welton Flakes DILATION N/A 10/19/2017   Procedure: Venia Minks DILATION;  Surgeon: Daneil Dolin, MD;  Location: [facility] SUITE;  Service: Endoscopy;  Laterality: N/A;   POLYPECTOMY  10/19/2017   Procedure: POLYPECTOMY;  Surgeon: Daneil Dolin, MD;  Location: [facility] SUITE;  Service: Endoscopy;;  sigmoid colon (CS)    REDUCTION MAMMAPLASTY Bilateral    S/P Hysterectomy     partial   TONSILLECTOMY     WRIST SURGERY Left 05/2016    Prior to Admission medications   Medication Sig Start Date End Date Taking? Authorizing Provider  albuterol (VENTOLIN HFA) 108 (90 Base) MCG/ACT inhaler Inhale 1-2 puffs into the lungs every 6 (six) hours as needed for shortness of breath or wheezing. 03/24/20  Yes [provider]  FLUoxetine (PROZAC) 40 MG capsule Take 40 mg by mouth in the morning. 06/06/12  Yes [provider]  pantoprazole (PROTONIX) 40 MG  tablet Take 1 tablet (40 mg total) by mouth daily. 05/11/21  Yes Annitta Needs, NP  risperiDONE (RISPERDAL) 3 MG tablet Take 3 mg by mouth at bedtime.    Yes [provider]  SYMBICORT 80-4.5 MCG/ACT inhaler Inhale 2 puffs into the lungs in the morning and at bedtime. 06/07/21  Yes [provider]  traZODone (DESYREL) 150 MG tablet Take 300 mg by mouth at bedtime. 07/20/15  Yes [provider]    Allergies as of 06/30/2021 - Review Complete 06/30/2021   Allergen Reaction Noted   Ciprofloxacin Rash    Kiwi extract Other (See Comments) 11/25/2015    Family History  Problem Relation Age of Onset   Diabetes Father    Colon cancer Maternal Uncle    Liver cancer Maternal Uncle        45 years old   Liver disease Neg Hx     Social History   Socioeconomic History   Marital status: Divorced    Spouse name: Not on file   Number of children: 2   Years of education: Not on file   Highest education level: Not on file  Occupational History   Occupation: disabled  Tobacco Use   Smoking status: Every Day    Packs/day: 1.00    Years: 36.00    Pack years: 36.00    Types: Cigarettes   Smokeless tobacco: Never  Vaping Use   Vaping Use: Never used  Substance and Sexual Activity   Alcohol use: No    Alcohol/week: 0.0 standard drinks   Drug use: Yes    Types: Marijuana    Comment: 3 joints/day   Sexual activity: Never  Other Topics Concern   Not on file  Social History Narrative   Lives w/ mother   2 grown children   Social Determinants of Health   Financial Resource Strain: Not on file  Food Insecurity: Not on file  Transportation Needs: Not on file  Physical Activity: Not on file  Stress: Not on file  Social Connections: Not on file  Intimate Partner Violence: Not on file    Review of Systems: See HPI, otherwise negative ROS  Physical Exam: BP 130/71   Pulse (!) 56   Temp 98.9 F (37.2 C) (Oral)   Resp 15   Ht 5\' 4"  (1.626 m)   Wt 90.7 kg   SpO2 94%   BMI 34.33 kg/m  General:   Alert,  Well-developed, well-nourished, pleasant and cooperative in NAD Neck:  Supple; no masses or thyromegaly. No significant cervical adenopathy. Lungs:  Clear throughout to auscultation.   No wheezes, crackles, or rhonchi. No acute distress. Heart:  Regular rate and rhythm; no murmurs, clicks, rubs,  or gallops. Abdomen: Non-distended, normal bowel sounds.  Soft and nontender without appreciable mass or hepatosplenomegaly.  Pulses:   Normal pulses noted. Extremities:  Without clubbing or edema.  Impression/Plan: 65 year old lady with [name] cirrhosis here for screening EGD. The risks, benefits, limitations, alternatives and imponderables have been reviewed with the patient. Potential for esophageal dilation, biopsy, etc. have also been reviewed.  Questions have been answered. All parties agreeable.     Notice: This dictation was prepared with Dragon dictation along with smaller phrase technology. Any transcriptional errors that result from this process are unintentional and may not be corrected upon review.

## 2021-07-16 NOTE — Discharge Instructions (Addendum)
EGD Discharge instructions Please read the instructions outlined below and refer to this sheet in the next few weeks. These discharge instructions provide you with general information on caring for yourself after you leave the hospital. Your doctor may also give you specific instructions. While your treatment has been planned according to the most current medical practices available, unavoidable complications occasionally occur. If you have any problems or questions after discharge, please call your doctor. ACTIVITY You may resume your regular activity but move at a slower pace for the next 24 hours.  Take frequent rest periods for the next 24 hours.  Walking will help expel (get rid of) the air and reduce the bloated feeling in your abdomen.  No driving for 24 hours (because of the anesthesia (medicine) used during the test).  You may shower.  Do not sign any important legal documents or operate any machinery for 24 hours (because of the anesthesia used during the test).  NUTRITION Drink plenty of fluids.  You may resume your normal diet.  Begin with a light meal and progress to your normal diet.  Avoid alcoholic beverages for 24 hours or as instructed by your caregiver.  MEDICATIONS You may resume your normal medications unless your caregiver tells you otherwise.  WHAT YOU CAN EXPECT TODAY You may experience abdominal discomfort such as a feeling of fullness or "gas" pains.  FOLLOW-UP Your doctor will discuss the results of your test with you.  SEEK IMMEDIATE MEDICAL ATTENTION IF ANY OF THE FOLLOWING OCCUR: Excessive nausea (feeling sick to your stomach) and/or vomiting.  Severe abdominal pain and distention (swelling).  Trouble swallowing.  Temperature over 101 F (37.8 C).  Rectal bleeding or vomiting of blood.     No varicose veins found today  I recommend repeat screening EGD in 3 years  Keep your upcoming office visit with [place]  At patient request, I called Cornelious Bryant  [phone] discussed results

## 2021-07-20 ENCOUNTER — Other Ambulatory Visit (HOSPITAL_COMMUNITY): Payer: Self-pay | Admitting: Family Medicine

## 2021-07-20 DIAGNOSIS — Z780 Asymptomatic menopausal state: Secondary | ICD-10-CM

## 2021-07-21 ENCOUNTER — Encounter (HOSPITAL_COMMUNITY): Payer: Self-pay | Admitting: Internal Medicine

## 2021-08-04 ENCOUNTER — Other Ambulatory Visit: Payer: Self-pay

## 2021-08-04 ENCOUNTER — Ambulatory Visit (HOSPITAL_COMMUNITY)
Admission: RE | Admit: 2021-08-04 | Discharge: 2021-08-04 | Disposition: A | Discharge status: Home / Self Care | Payer: 59 | Source: Ambulatory Visit | Attending: Family Medicine | Admitting: Family Medicine

## 2021-08-04 DIAGNOSIS — Z780 Asymptomatic menopausal state: Secondary | ICD-10-CM | POA: Insufficient documentation

## 2021-10-05 ENCOUNTER — Telehealth: Payer: Self-pay | Admitting: Internal Medicine

## 2021-10-05 NOTE — Telephone Encounter (Signed)
RECALL FOR ULTRASOUND 

## 2021-10-05 NOTE — Telephone Encounter (Signed)
Recall sent 

## 2021-10-11 ENCOUNTER — Telehealth: Payer: Self-pay | Admitting: Internal Medicine

## 2021-10-11 DIAGNOSIS — K7460 Unspecified cirrhosis of liver: Secondary | ICD-10-CM

## 2021-10-11 NOTE — Telephone Encounter (Signed)
Patient received letter that she was due to have her ultrasound

## 2021-10-11 NOTE — Telephone Encounter (Signed)
[place] scheduled for 2/14 at 10:30am, arrival 10:15am, npo midnight.  Pt aware of appt details.

## 2021-10-11 NOTE — Addendum Note (Signed)
Addended by: Cheron Every on: 10/11/2021 03:52 PM   Modules accepted: Orders

## 2021-10-19 ENCOUNTER — Ambulatory Visit (HOSPITAL_COMMUNITY)
Admission: RE | Admit: 2021-10-19 | Discharge: 2021-10-19 | Disposition: A | Discharge status: Home / Self Care | Payer: 59 | Source: Ambulatory Visit | Attending: Gastroenterology | Admitting: Gastroenterology

## 2021-10-19 ENCOUNTER — Other Ambulatory Visit: Payer: Self-pay

## 2021-10-19 DIAGNOSIS — K7460 Unspecified cirrhosis of liver: Secondary | ICD-10-CM | POA: Insufficient documentation

## 2021-10-21 ENCOUNTER — Other Ambulatory Visit: Payer: Self-pay | Admitting: Gastroenterology

## 2021-10-21 DIAGNOSIS — K219 Gastro-esophageal reflux disease without esophagitis: Secondary | ICD-10-CM

## 2021-12-29 ENCOUNTER — Encounter: Payer: Self-pay | Admitting: Internal Medicine

## 2021-12-29 ENCOUNTER — Ambulatory Visit (INDEPENDENT_AMBULATORY_CARE_PROVIDER_SITE_OTHER): Payer: 59 | Admitting: Gastroenterology

## 2021-12-29 ENCOUNTER — Encounter: Payer: Self-pay | Admitting: Gastroenterology

## 2021-12-29 VITALS — BP 110/76 | HR 72 | Temp 97.3°F | Ht 64.0 in | Wt 215.2 lb

## 2021-12-29 DIAGNOSIS — K7460 Unspecified cirrhosis of liver: Secondary | ICD-10-CM | POA: Diagnosis not present

## 2021-12-29 DIAGNOSIS — R911 Solitary pulmonary nodule: Secondary | ICD-10-CM

## 2021-12-29 MED ORDER — ONDANSETRON 4 MG PO TBDP: 4.0000 mg | ORAL_TABLET | Freq: Three times a day (TID) | ORAL | 0 refills | Status: DC | PRN

## 2021-12-29 NOTE — Patient Instructions (Signed)
Please complete the chest xray. This is a walk-in appointment. ? ?I have ordered blood work today. ? ?Your next ultrasound is in August 2023, which we will arrange. ? ?Your next endoscopy is in 2025! ? ?Your  next colonoscopy is in 2029. ? ?We will see you in 6 months! ? ?I enjoyed seeing you again today! As you know, I value our relationship and want to provide genuine, compassionate, and quality care. I welcome your feedback. If you receive a survey regarding your visit,  I greatly appreciate you taking time to fill this out. See you next time! ? ?Annitta Needs, PhD, ANP-BC ?[facility]  ? ?

## 2021-12-29 NOTE — Progress Notes (Signed)
? ? ? ? ?Gastroenterology Office Note   ? ? ?Primary Care Physician:  [facility]  ?Primary Gastroenterologist: Dr. Gala Romney  ? ? ?Chief Complaint  ? ?Chief Complaint  ?Patient presents with  ? Follow-up  ?  6 mo follow up  ? ? ? ?History of Present Illness  ? ?[name] is a 66 y.o. female presenting today in follow-up with a history of cirrhosis due to  NASH, GERD, constipation, abnormal CT in 2018 with peripancreatic soft tissue density favored to represent upper normal gastrohepatic ligament node. Non-specific left hepatic lobe low-density lesion present that was noted in 2017. CT in July 2021 due to RLQ  without IV contrast as unable to obtain IV after multiple attempts, without acute findings. Non-obstructive known bowel-containing ventral hernia.  Right lower lobe area of peripheral ground-glass opacity, ?infectious or inflammatory. She was to have a CXR but did not complete this. ? ?Recent EGD Nov 2022 without varices. Next will be due in Nov 2025. Colonoscopy due in 2019. ? ?No abdominal pain, vomiting, changes in bowel habits, constipation, diarrhea, overt GI bleeding, GERD, dysphagia, unexplained weight loss, lack of appetite, unexplained weight gain. No mental status changes or confusion. No jaundice or pruritis. No edema or ascites. She does note occasional nausea but rare.  ? ?[place] due in Aug 2023.  ? ? ? ? ?Past Medical History:  ?Diagnosis Date  ? Chronic constipation   ? Cirrhosis ([place])   ? child pugh class A and MELD 5, completed hep A and B vaccine series, no HCC on 3/16 MRI  ? Colonic inertia   ? Depression   ? Diverticula of colon   ? Diverticulosis 01/31/2008  ? colonoscopy Dr Gala Romney  ? GERD (gastroesophageal reflux disease)   ? Hyperplastic colon polyp   ? Liver mass   ? Macular degeneration   ? Obesity   ? PUD (peptic ulcer disease) 1988  ? Schizoaffective disorder   ? Mental Health-Dr Elias Else  ? Seizures ([place])   ? as child; no medsd and has had no seizures since then.  ? Wrist  fracture   ? Left wrist s/p fall and surgical repair  ? ? ?Past Surgical History:  ?Procedure Laterality Date  ? ABDOMINAL HYSTERECTOMY    ? CHOLECYSTECTOMY  08/2010  ? cholelithiasis; Dr Arnoldo Morale  ? COLONOSCOPY  01/31/2008  ? Dr. Gala Romney- normal rectum, L sided diverticula, long tortuous colon. hyperplasitic  polyp.  ? COLONOSCOPY WITH PROPOFOL N/A 10/19/2017  ?  single 4 mm polyp in the sigmoid colon which was sessile, scattered small mouth diverticula in the sigmoid and descending colon, otherwise normal.  Surgical pathology found the polyp to be hyperplastic.  Recommended 10-year repeat colonoscopy.  ? ERCP W/ SPHINCTEROTOMY AND BALLOON DILATION  08/05/2010  ? normal ampulla/mild erythema in the gastric remnant without ulcerations or erosions. normal retroflexed view of the cardia/anastomosis normal/distal common bile duct stones s/p extraction and shincterotomy  ? ESOPHAGOGASTRODUODENOSCOPY N/A 10/24/2013  ? DVV:OHYWVP esophagus. Surgically altered stomach. Abnormal gastric mucosa  -  status post biopsy (reactive gastrophathy)  ? ESOPHAGOGASTRODUODENOSCOPY (EGD) WITH PROPOFOL N/A 11/30/2015  ? LA grade A esophagitis, no varices, surveillance in 2019  ? ESOPHAGOGASTRODUODENOSCOPY (EGD) WITH PROPOFOL N/A 10/19/2017  ? erosive reflux esophagitis status post dilation, surgically altered stomach status post gastric bypass with a single patent apparent small bowel limb, small hiatal hernia.  ? ESOPHAGOGASTRODUODENOSCOPY (EGD) WITH PROPOFOL N/A 07/16/2021  ? normal esophagus, no varices, portal gastropathy. 3 year screening  ?  EUS N/A 08/20/2015  ? Dr. Ardis Hughs: well-circumscribed 5.2 cm chronic mass in porta hepatis, internally mass is gelatinous. Small distal esophageal varices (limited view), Bilroth 1 type anatomy, cytology NEGATIVE for malignant cells. Suspicion for neoplasm low   ? FOOT SURGERY    ? left  ? GASTRIC BYPASS    ? with Revision, Dr. Duffy Rhody  ? MALONEY DILATION N/A 10/19/2017  ? Procedure: MALONEY  DILATION;  Surgeon: Daneil Dolin, MD;  [facility];  Service: Endoscopy;  Laterality: N/A;  ? POLYPECTOMY  10/19/2017  ? Procedure: POLYPECTOMY;  Surgeon: Daneil Dolin, MD;  [facility];  Service: Endoscopy;;  sigmoid colon (CS) ?  ? REDUCTION MAMMAPLASTY Bilateral   ? S/P Hysterectomy    ? partial  ? TONSILLECTOMY    ? WRIST SURGERY Left 05/2016  ? ? ?Current Outpatient Medications  ?Medication Sig Dispense Refill  ? albuterol (VENTOLIN HFA) 108 (90 Base) MCG/ACT inhaler Inhale 1-2 puffs into the lungs every 6 (six) hours as needed for shortness of breath or wheezing.    ? FLUoxetine (PROZAC) 40 MG capsule Take 40 mg by mouth in the morning.    ? ondansetron (ZOFRAN-ODT) 4 MG disintegrating tablet Take 1 tablet (4 mg total) by mouth every 8 (eight) hours as needed for nausea or vomiting. 20 tablet 0  ? pantoprazole (PROTONIX) 40 MG tablet TAKE 1 TABLET BY MOUTH  DAILY 90 tablet 3  ? risperiDONE (RISPERDAL) 3 MG tablet Take 3 mg by mouth at bedtime.     ? SYMBICORT 80-4.5 MCG/ACT inhaler Inhale 2 puffs into the lungs in the morning and at bedtime.    ? traZODone (DESYREL) 150 MG tablet Take 300 mg by mouth at bedtime.    ? ?No current facility-administered medications for this visit.  ? ? ?Allergies as of 12/29/2021 - Review Complete 12/29/2021  ?Allergen Reaction Noted  ? Ciprofloxacin Rash   ? Kiwi extract Other (See Comments) 11/25/2015  ? ? ?Family History  ?Problem Relation Age of Onset  ? Diabetes Father   ? Colon cancer Maternal Uncle   ? Liver cancer Maternal Uncle   ?     8 years old  ? Liver disease Neg Hx   ? ? ?Social History  ? ?Socioeconomic History  ? Marital status: Divorced  ?  Spouse name: Not on file  ? Number of children: 2  ? Years of education: Not on file  ? Highest education level: Not on file  ?Occupational History  ? Occupation: disabled  ?Tobacco Use  ? Smoking status: Every Day  ?  Packs/day: 1.00  ?  Years: 36.00  ?  Pack years: 36.00  ?  Types: Cigarettes   ? Smokeless tobacco: Never  ?Vaping Use  ? Vaping Use: Never used  ?Substance and Sexual Activity  ? Alcohol use: No  ?  Alcohol/week: 0.0 standard drinks  ? Drug use: Yes  ?  Types: Marijuana  ?  Comment: 3 joints/day  ? Sexual activity: Never  ?Other Topics Concern  ? Not on file  ?Social History Narrative  ? Lives w/ mother  ? 2 grown children  ? ?Social Determinants of Health  ? ?Financial Resource Strain: Not on file  ?Food Insecurity: Not on file  ?Transportation Needs: Not on file  ?Physical Activity: Not on file  ?Stress: Not on file  ?Social Connections: Not on file  ?Intimate Partner Violence: Not on file  ? ? ? ?Review of Systems  ? ?Gen:  Denies any fever, chills, fatigue, weight loss, lack of appetite.  ?CV: Denies chest pain, heart palpitations, peripheral edema, syncope.  ?Resp: Denies shortness of breath at rest or with exertion. Denies wheezing or cough.  ?GI: see HPI ?GU : Denies urinary burning, urinary frequency, urinary hesitancy ?MS: Denies joint pain, muscle weakness, cramps, or limitation of movement.  ?Derm: Denies rash, itching, dry skin ?Psych: Denies depression, anxiety, memory loss, and confusion ?Heme: Denies bruising, bleeding, and enlarged lymph nodes. ? ? ?Physical Exam  ? ?BP 110/76   Pulse 72   Temp (!) 97.3 ?F (36.3 ?C)   Ht '5\' 4"'$  (1.626 m)   Wt 215 lb 3.2 oz (97.6 kg)   BMI 36.94 kg/m?  ?General:   Alert and oriented. Pleasant and cooperative. Well-nourished and well-developed.  ?Head:  Normocephalic and atraumatic. ?Eyes:  Without icterus ?Abdomen:  +BS, soft, non-tender and non-distended. No HSM noted. No guarding or rebound. No masses appreciated.  ?Rectal:  Deferred  ?Msk:  Symmetrical without gross deformities. Normal posture. ?Extremities:  Without edema. ?Neurologic:  Alert and  oriented x4;  grossly normal neurologically. ?Skin:  Intact without significant lesions or rashes. ?Psych:  Alert and cooperative. Normal mood and affect. ? ? ?Assessment  ? ?[name]  is a 66 y.o. female presenting today in follow-up with a history cirrhosis due to  NASH, GERD, and constipation. ? ? ?NASH cirrhosis: well-compensated. Labs due now. [place] in Aug 2023. EGD on file with next due i

## 2022-01-18 ENCOUNTER — Other Ambulatory Visit: Payer: Self-pay

## 2022-01-18 ENCOUNTER — Telehealth: Payer: Self-pay

## 2022-01-18 DIAGNOSIS — K7460 Unspecified cirrhosis of liver: Secondary | ICD-10-CM

## 2022-01-18 NOTE — Addendum Note (Signed)
Addended by: Annitta Needs on: 01/18/2022 10:55 AM ? ? Modules accepted: Orders ? ?

## 2022-01-18 NOTE — Telephone Encounter (Signed)
Pt phoned and LMOVM regarding [facility] doesn't take her insurance.  ?I phoned the pt and ask her did [facility] call her and I was advised by her no.  She just think they did because she had always went to [place] ?

## 2022-01-18 NOTE — Telephone Encounter (Signed)
I have ordered labs for [facility]. Just needs to be released and sent to [facility]. Thanks! ? ?

## 2022-01-18 NOTE — Telephone Encounter (Signed)
Phoned and LMOVM for the pt regarding her going to [place] if she prefers. Vicente Males put in labs and they have been released ?

## 2022-01-19 ENCOUNTER — Telehealth: Payer: Self-pay | Admitting: Gastroenterology

## 2022-01-19 NOTE — Telephone Encounter (Signed)
Phoned and advised the pt to go ahead and have the blood work done like advised yesterday and it is on her AVS. Understanding was expressed ?

## 2022-01-19 NOTE — Telephone Encounter (Signed)
Patient called asking if she was supposed to have blood work done now     ?

## 2022-01-21 ENCOUNTER — Ambulatory Visit (HOSPITAL_COMMUNITY)
Admission: RE | Admit: 2022-01-21 | Discharge: 2022-01-21 | Disposition: A | Discharge status: Home / Self Care | Payer: 59 | Source: Ambulatory Visit | Attending: Gastroenterology | Admitting: Gastroenterology

## 2022-01-21 DIAGNOSIS — R911 Solitary pulmonary nodule: Secondary | ICD-10-CM

## 2022-01-22 LAB — COMPREHENSIVE METABOLIC PANEL
ALT: 19 IU/L (ref 0–32)
AST: 19 IU/L (ref 0–40)
Albumin/Globulin Ratio: 1.5 (ref 1.2–2.2)
Albumin: 4.1 g/dL (ref 3.8–4.8)
Alkaline Phosphatase: 99 IU/L (ref 44–121)
BUN/Creatinine Ratio: 11 — ABNORMAL LOW (ref 12–28)
BUN: 14 mg/dL (ref 8–27)
Bilirubin Total: 0.3 mg/dL (ref 0.0–1.2)
CO2: 20 mmol/L (ref 20–29)
Calcium: 9.4 mg/dL (ref 8.7–10.3)
Chloride: 107 mmol/L — ABNORMAL HIGH (ref 96–106)
Creatinine, Ser: 1.27 mg/dL — ABNORMAL HIGH (ref 0.57–1.00)
Globulin, Total: 2.7 g/dL (ref 1.5–4.5)
Glucose: 114 mg/dL — ABNORMAL HIGH (ref 70–99)
Potassium: 4.1 mmol/L (ref 3.5–5.2)
Sodium: 142 mmol/L (ref 134–144)
Total Protein: 6.8 g/dL (ref 6.0–8.5)
eGFR: 47 mL/min/{1.73_m2} — ABNORMAL LOW (ref >59)

## 2022-01-22 LAB — CBC WITH DIFFERENTIAL/PLATELET
Basophils Absolute: 0.1 10*3/uL (ref 0.0–0.2)
Basos: 1 %
EOS (ABSOLUTE): 0.4 10*3/uL (ref 0.0–0.4)
Eos: 3 %
Hematocrit: 42.2 % (ref 34.0–46.6)
Hemoglobin: 13.8 g/dL (ref 11.1–15.9)
Immature Grans (Abs): 0 10*3/uL (ref 0.0–0.1)
Immature Granulocytes: 0 %
Lymphocytes Absolute: 1.8 10*3/uL (ref 0.7–3.1)
Lymphs: 16 %
MCH: 30.3 pg (ref 26.6–33.0)
MCHC: 32.7 g/dL (ref 31.5–35.7)
MCV: 93 fL (ref 79–97)
Monocytes Absolute: 0.8 10*3/uL (ref 0.1–0.9)
Monocytes: 7 %
Neutrophils Absolute: 8.6 10*3/uL — ABNORMAL HIGH (ref 1.4–7.0)
Neutrophils: 73 %
Platelets: 370 10*3/uL (ref 150–450)
RBC: 4.55 x10E6/uL (ref 3.77–5.28)
RDW: 13.1 % (ref 11.7–15.4)
WBC: 11.6 10*3/uL — ABNORMAL HIGH (ref 3.4–10.8)

## 2022-01-22 LAB — PROTIME-INR
INR: 1 (ref 0.9–1.2)
Prothrombin Time: 10.3 s (ref 9.1–12.0)

## 2022-01-22 LAB — AFP TUMOR MARKER: AFP, Serum, Tumor Marker: 2.4 ng/mL (ref 0.0–9.2)

## 2022-02-08 ENCOUNTER — Ambulatory Visit
Admission: EM | Admit: 2022-02-08 | Discharge: 2022-02-08 | Disposition: A | Discharge status: Home / Self Care | Payer: 59 | Attending: Nurse Practitioner | Admitting: Nurse Practitioner

## 2022-02-08 ENCOUNTER — Encounter: Payer: Self-pay | Admitting: Emergency Medicine

## 2022-02-08 DIAGNOSIS — R059 Cough, unspecified: Secondary | ICD-10-CM | POA: Diagnosis not present

## 2022-02-08 DIAGNOSIS — J0110 Acute frontal sinusitis, unspecified: Secondary | ICD-10-CM

## 2022-02-08 MED ORDER — FLUTICASONE PROPIONATE 50 MCG/ACT NA SUSP
2.0000 | Freq: Every day | NASAL | 0 refills | Status: DC
Start: 2022-02-08 — End: 2023-06-15

## 2022-02-08 MED ORDER — AMOXICILLIN-POT CLAVULANATE 875-125 MG PO TABS: 1.0000 | ORAL_TABLET | Freq: Two times a day (BID) | ORAL | 0 refills | Status: DC

## 2022-02-08 MED ORDER — PREDNISONE 20 MG PO TABS: 40.0000 mg | ORAL_TABLET | Freq: Every day | ORAL | 0 refills | Status: AC

## 2022-02-08 MED ORDER — BENZONATATE 100 MG PO CAPS: 100.0000 mg | ORAL_CAPSULE | Freq: Three times a day (TID) | ORAL | 0 refills | Status: AC | PRN

## 2022-02-08 NOTE — Discharge Instructions (Signed)
Take medication as prescribed. Increase fluids and allow for plenty of rest. Recommend Tylenol or ibuprofen as needed for pain, fever, or general discomfort. Warm salt water gargles 3-4 times daily to help with throat pain or discomfort. Recommend using a humidifier at bedtime during sleep to help with cough and nasal congestion. Sleep elevated on 2 pillows. Follow-up if your PCP in the next 7-10 days for reevaluation, follow-up in our clinic sooner if symptoms do not improve.

## 2022-02-08 NOTE — ED Triage Notes (Signed)
Head congestion, dry cough, left ear pain and throat sore on left side x 5 weeks.

## 2022-02-08 NOTE — ED Provider Notes (Signed)
[facility]    CSN: [id number] Arrival date & time: 02/08/22  3299      History   Chief Complaint No chief complaint on file.   HPI [name] is a 66 y.o. female.   The history is provided by the patient.   Patient presents for complaints of head congestion, nasal congestion, cough, left ear pain and sore throat that has been present for the past 5 weeks.  She also complains of redness, dryness, and itching of her eyes.  She denies fever, chills, headache, or GI symptoms.  Patient has been taking Claritin for her symptoms.  She does currently smoke, has been a smoker for the past 40 years.  She states she has never received an official diagnosis of COPD or asthma.  She denies any sick contacts as she thought this was her allergies.  Past Medical History:  Diagnosis Date   Chronic constipation    Cirrhosis ([place])    child pugh class A and MELD 5, completed hep A and B vaccine series, no HCC on 3/16 MRI   Colonic inertia    Depression    Diverticula of colon    Diverticulosis 01/31/2008   colonoscopy Dr Gala Romney   GERD (gastroesophageal reflux disease)    Hyperplastic colon polyp    Liver mass    Macular degeneration    Obesity    PUD (peptic ulcer disease) 1988   Schizoaffective disorder    Mental Health-Dr Dekle   Seizures ([place])    as child; no medsd and has had no seizures since then.   Wrist fracture    Left wrist s/p fall and surgical repair    Patient Active Problem List   Diagnosis Date Noted   Lung nodule 12/29/2020   Diarrhea 09/17/2020   Encounter for screening colonoscopy 09/20/2017   GERD (gastroesophageal reflux disease) 09/15/2016   Mucosal abnormality of stomach    Nausea with vomiting 11/04/2015   Abdominal pain, epigastric 09/17/2015   Peritonitis ([place]) 08/27/2015   Pain following surgery or procedure 08/22/2015   Abdominal pain 08/01/2015   Nausea 08/01/2015   Abdominal mass 08/01/2015   Schizoaffective disorder ([place]) 05/15/2014    Hepatic cirrhosis ([place]) 10/18/2013   LLQ pain 08/13/2013   DEPRESSION 04/08/2008   PUD 04/08/2008   DIVERTICULOSIS, COLON 04/08/2008   Constipation 04/08/2008   MORBID OBESITY, HX OF 04/08/2008    Past Surgical History:  Procedure Laterality Date   ABDOMINAL HYSTERECTOMY     CHOLECYSTECTOMY  08/2010   cholelithiasis; Dr Arnoldo Morale   COLONOSCOPY  01/31/2008   Dr. Gala Romney- normal rectum, L sided diverticula, long tortuous colon. hyperplasitic  polyp.   COLONOSCOPY WITH PROPOFOL N/A 10/19/2017    single 4 mm polyp in the sigmoid colon which was sessile, scattered small mouth diverticula in the sigmoid and descending colon, otherwise normal.  Surgical pathology found the polyp to be hyperplastic.  Recommended 10-year repeat colonoscopy.   ERCP W/ SPHINCTEROTOMY AND BALLOON DILATION  08/05/2010   normal ampulla/mild erythema in the gastric remnant without ulcerations or erosions. normal retroflexed view of the cardia/anastomosis normal/distal common bile duct stones s/p extraction and shincterotomy   ESOPHAGOGASTRODUODENOSCOPY N/A 10/24/2013   MEQ:ASTMHD esophagus. Surgically altered stomach. Abnormal gastric mucosa  -  status post biopsy (reactive gastrophathy)   ESOPHAGOGASTRODUODENOSCOPY (EGD) WITH PROPOFOL N/A 11/30/2015   LA grade A esophagitis, no varices, surveillance in 2019   ESOPHAGOGASTRODUODENOSCOPY (EGD) WITH PROPOFOL N/A 10/19/2017   erosive reflux esophagitis status post dilation, surgically altered  stomach status post gastric bypass with a single patent apparent small bowel limb, small hiatal hernia.   ESOPHAGOGASTRODUODENOSCOPY (EGD) WITH PROPOFOL N/A 07/16/2021   normal esophagus, no varices, portal gastropathy. 3 year screening   EUS N/A 08/20/2015   Dr. Ardis Hughs: well-circumscribed 5.2 cm chronic mass in porta hepatis, internally mass is gelatinous. Small distal esophageal varices (limited view), Bilroth 1 type anatomy, cytology NEGATIVE for malignant cells. Suspicion for  neoplasm low    FOOT SURGERY     left   GASTRIC BYPASS     with Revision, Dr. Welton Flakes DILATION N/A 10/19/2017   Procedure: Venia Minks DILATION;  Surgeon: Daneil Dolin, MD;  [facility];  Service: Endoscopy;  Laterality: N/A;   POLYPECTOMY  10/19/2017   Procedure: POLYPECTOMY;  Surgeon: Daneil Dolin, MD;  [facility];  Service: Endoscopy;;  sigmoid colon (CS)    REDUCTION MAMMAPLASTY Bilateral    S/P Hysterectomy     partial   TONSILLECTOMY     WRIST SURGERY Left 05/2016    OB History   No obstetric history on file.      Home Medications    Prior to Admission medications   Medication Sig Start Date End Date Taking? Authorizing Provider  amoxicillin-clavulanate (AUGMENTIN) 875-125 MG tablet Take 1 tablet by mouth every 12 (twelve) hours. 02/08/22  Yes Agness Sibrian-Warren, Alda Lea, NP  benzonatate (TESSALON PERLES) 100 MG capsule Take 1 capsule (100 mg total) by mouth 3 (three) times daily as needed for up to 10 days for cough. 02/08/22 02/18/22 Yes Shene Maxfield-Warren, Alda Lea, NP  fluticasone (FLONASE) 50 MCG/ACT nasal spray Place 2 sprays into both nostrils daily. 02/08/22  Yes Torri Michalski-Warren, Alda Lea, NP  predniSONE (DELTASONE) 20 MG tablet Take 2 tablets (40 mg total) by mouth daily with breakfast for 5 days. 02/08/22 02/13/22 Yes Zierra Laroque-Warren, Alda Lea, NP  albuterol (VENTOLIN HFA) 108 (90 Base) MCG/ACT inhaler Inhale 1-2 puffs into the lungs every 6 (six) hours as needed for shortness of breath or wheezing. 03/24/20   [provider]  FLUoxetine (PROZAC) 40 MG capsule Take 40 mg by mouth in the morning. 06/06/12   [provider]  ondansetron (ZOFRAN-ODT) 4 MG disintegrating tablet Take 1 tablet (4 mg total) by mouth every 8 (eight) hours as needed for nausea or vomiting. 12/29/21   Annitta Needs, NP  pantoprazole (PROTONIX) 40 MG tablet TAKE 1 TABLET BY MOUTH  DAILY 10/22/21   Sherron Monday, NP  risperiDONE (RISPERDAL) 3 MG tablet  Take 3 mg by mouth at bedtime.     [provider]  SYMBICORT 80-4.5 MCG/ACT inhaler Inhale 2 puffs into the lungs in the morning and at bedtime. 06/07/21   [provider]  traZODone (DESYREL) 150 MG tablet Take 300 mg by mouth at bedtime. 07/20/15   [provider]    Family History Family History  Problem Relation Age of Onset   Diabetes Father    Colon cancer Maternal Uncle    Liver cancer Maternal Uncle        15 years old   Liver disease Neg Hx     Social History Social History   Tobacco Use   Smoking status: Every Day    Packs/day: 1.00    Years: 36.00    Pack years: 36.00    Types: Cigarettes   Smokeless tobacco: Never  Vaping Use   Vaping Use: Never used  Substance Use Topics   Alcohol use: No  Alcohol/week: 0.0 standard drinks   Drug use: Yes    Types: Marijuana    Comment: 3 joints/day     Allergies   Ciprofloxacin and Kiwi extract   Review of Systems Review of Systems Per HPI  Physical Exam Triage Vital Signs ED Triage Vitals [02/08/22 0821]  Enc Vitals Group     BP 116/78     Pulse Rate 92     Resp 18     Temp 97.9 F (36.6 C)     Temp Source Oral     SpO2 95 %     Weight      Height      Head Circumference      Peak Flow      Pain Score 7     Pain Loc      Pain Edu?      Excl. in [place]?    No data found.  Updated Vital Signs BP 116/78 (BP Location: Right Arm)   Pulse 92   Temp 97.9 F (36.6 C) (Oral)   Resp 18   SpO2 95%   Visual Acuity Right Eye Distance:   Left Eye Distance:   Bilateral Distance:    Right Eye Near:   Left Eye Near:    Bilateral Near:     Physical Exam Vitals and nursing note reviewed.  Constitutional:      General: She is not in acute distress.    Appearance: Normal appearance.  HENT:     Head: Normocephalic.     Right Ear: Tympanic membrane, ear canal and external ear normal.     Left Ear: Ear canal and external ear normal. Tympanic membrane is erythematous and  bulging.     Nose: Congestion present.     Right Turbinates: Enlarged and swollen.     Left Turbinates: Enlarged and swollen.     Right Sinus: Frontal sinus tenderness present. No maxillary sinus tenderness.     Left Sinus: Frontal sinus tenderness present. No maxillary sinus tenderness.     Mouth/Throat:     Mouth: Mucous membranes are moist.     Pharynx: Posterior oropharyngeal erythema present. No pharyngeal swelling.     Tonsils: No tonsillar exudate. 0 on the right. 0 on the left.  Eyes:     Extraocular Movements: Extraocular movements intact.     Pupils: Pupils are equal, round, and reactive to light.  Cardiovascular:     Rate and Rhythm: Normal rate and regular rhythm.     Pulses: Normal pulses.     Heart sounds: Normal heart sounds.  Pulmonary:     Effort: Pulmonary effort is normal.     Breath sounds: Wheezing (LLL, expiratory) present.  Abdominal:     General: Bowel sounds are normal.     Palpations: Abdomen is soft.  Musculoskeletal:     Cervical back: Normal range of motion.  Lymphadenopathy:     Cervical: No cervical adenopathy.  Skin:    General: Skin is warm and dry.  Neurological:     General: No focal deficit present.     Mental Status: She is alert and oriented to person, place, and time.  Psychiatric:        Mood and Affect: Mood normal.        Behavior: Behavior normal.     UC Treatments / Results  Labs (all labs ordered are listed, but only abnormal results are displayed) Labs Reviewed - No data to display  EKG   Radiology No results  found.  Procedures Procedures (including critical care time)  Medications Ordered in UC Medications - No data to display  Initial Impression / Assessment and Plan / UC Course  I have reviewed the triage vital signs and the nursing notes.  Pertinent labs & imaging results that were available during my care of the patient were reviewed by me and considered in my medical decision making (see chart for  details).  Presents for complaints of upper respiratory symptoms for the past 5 weeks.  On exam, patient has moderate nasal congestion, cough, wheezing, and frontal maxillary sinus tenderness.  Based on the duration of her symptoms, and her current presentation, will start patient on Augmentin, Tessalon Perles, fluticasone, and prednisone.  Given her 40-year history of smoking, symptoms most likely exacerbated by this.  We will start patient on prednisone for that reason.  Patient denies any history of COPD, recommend that she follow-up with her primary care physician for further evaluation.  Supportive care recommendations were provided.  Follow-up as needed. Final Clinical Impressions(s) / UC Diagnoses   Final diagnoses:  Acute frontal sinusitis, recurrence not specified  Cough, unspecified type     Discharge Instructions      Take medication as prescribed. Increase fluids and allow for plenty of rest. Recommend Tylenol or ibuprofen as needed for pain, fever, or general discomfort. Warm salt water gargles 3-4 times daily to help with throat pain or discomfort. Recommend using a humidifier at bedtime during sleep to help with cough and nasal congestion. Sleep elevated on 2 pillows. Follow-up if your PCP in the next 7-10 days for reevaluation, follow-up in our clinic sooner if symptoms do not improve.      ED Prescriptions     Medication Sig Dispense Auth. Provider   amoxicillin-clavulanate (AUGMENTIN) 875-125 MG tablet Take 1 tablet by mouth every 12 (twelve) hours. 14 tablet Jujuan Dugo-Warren, Alda Lea, NP   predniSONE (DELTASONE) 20 MG tablet Take 2 tablets (40 mg total) by mouth daily with breakfast for 5 days. 10 tablet Esmerelda Finnigan-Warren, Alda Lea, NP   fluticasone (FLONASE) 50 MCG/ACT nasal spray Place 2 sprays into both nostrils daily. 16 g Maudean Hoffmann-Warren, Alda Lea, NP   benzonatate (TESSALON PERLES) 100 MG capsule Take 1 capsule (100 mg total) by mouth 3 (three) times daily as needed  for up to 10 days for cough. 30 capsule Nachelle Negrette-Warren, Alda Lea, NP      PDMP not reviewed this encounter.   Tish Men, NP 02/08/22 [phone]

## 2022-03-24 ENCOUNTER — Telehealth: Payer: Self-pay | Admitting: Internal Medicine

## 2022-03-24 DIAGNOSIS — K7460 Unspecified cirrhosis of liver: Secondary | ICD-10-CM

## 2022-03-24 NOTE — Telephone Encounter (Signed)
Recall for ultrasound 

## 2022-03-24 NOTE — Telephone Encounter (Signed)
Recall sent 

## 2022-04-18 ENCOUNTER — Other Ambulatory Visit: Payer: Self-pay | Admitting: *Deleted

## 2022-04-18 NOTE — Telephone Encounter (Signed)
Pt called in to schedule [place].

## 2022-04-18 NOTE — Telephone Encounter (Signed)
Patient informed us is scheduled for 04/27/22 arrive at 8:15 am at [facility], nothing to eat or drink after midnight. Pt verbalized understanding.

## 2022-04-18 NOTE — Addendum Note (Signed)
Addended by: Cheron Every on: 04/18/2022 02:03 PM   Modules accepted: Orders

## 2022-04-27 ENCOUNTER — Ambulatory Visit (HOSPITAL_COMMUNITY)
Admission: RE | Admit: 2022-04-27 | Discharge: 2022-04-27 | Disposition: A | Discharge status: Home / Self Care | Payer: 59 | Source: Ambulatory Visit | Attending: Gastroenterology | Admitting: Gastroenterology

## 2022-04-27 DIAGNOSIS — K7460 Unspecified cirrhosis of liver: Secondary | ICD-10-CM | POA: Insufficient documentation

## 2022-06-30 ENCOUNTER — Ambulatory Visit: Payer: 59 | Admitting: Gastroenterology

## 2022-07-25 ENCOUNTER — Other Ambulatory Visit (HOSPITAL_COMMUNITY): Payer: Self-pay | Admitting: Family Medicine

## 2022-07-25 DIAGNOSIS — Z1231 Encounter for screening mammogram for malignant neoplasm of breast: Secondary | ICD-10-CM

## 2022-08-08 ENCOUNTER — Ambulatory Visit (HOSPITAL_COMMUNITY)
Admission: RE | Admit: 2022-08-08 | Discharge: 2022-08-08 | Disposition: A | Discharge status: Home / Self Care | Payer: 59 | Source: Ambulatory Visit | Attending: Family Medicine | Admitting: Family Medicine

## 2022-08-08 DIAGNOSIS — Z1231 Encounter for screening mammogram for malignant neoplasm of breast: Secondary | ICD-10-CM | POA: Diagnosis present

## 2022-09-20 ENCOUNTER — Other Ambulatory Visit: Payer: Self-pay | Admitting: Gastroenterology

## 2022-09-20 DIAGNOSIS — K219 Gastro-esophageal reflux disease without esophagitis: Secondary | ICD-10-CM

## 2022-09-29 ENCOUNTER — Ambulatory Visit (INDEPENDENT_AMBULATORY_CARE_PROVIDER_SITE_OTHER): Payer: 59 | Admitting: Gastroenterology

## 2022-09-29 ENCOUNTER — Encounter: Payer: Self-pay | Admitting: Gastroenterology

## 2022-09-29 VITALS — BP 109/72 | HR 61 | Temp 97.4°F | Ht 64.0 in | Wt 223.6 lb

## 2022-09-29 DIAGNOSIS — K7460 Unspecified cirrhosis of liver: Secondary | ICD-10-CM | POA: Diagnosis not present

## 2022-09-29 DIAGNOSIS — K5900 Constipation, unspecified: Secondary | ICD-10-CM

## 2022-09-29 DIAGNOSIS — K219 Gastro-esophageal reflux disease without esophagitis: Secondary | ICD-10-CM | POA: Diagnosis not present

## 2022-09-29 NOTE — Patient Instructions (Signed)
We are arranging a routine ultrasound in the near future!  Please have blood work done when you are able.  Let's start Linzess again one capsule 30 minutes before breakfast. We have room to adjust this dosing if needed. Linzess works best when taken once a day every day, on an empty stomach, at least 30 minutes before your first meal of the day.  When Linzess is taken daily as directed:  *Constipation relief is typically felt in about a week *IBS-C patients may begin to experience relief from belly pain and overall abdominal symptoms (pain, discomfort, and bloating) in about 1 week,   with symptoms typically improving over 12 weeks.  Diarrhea may occur in the first 2 weeks -keep taking it.  The diarrhea should go away and you should start having normal, complete, full bowel movements. It may be helpful to start treatment when you can be near the comfort of your own bathroom, such as a weekend.    We will see you in 3 months for constipation follow-up!  I enjoyed seeing you again today! At our first visit, I mentioned how I value our relationship and want to provide genuine, compassionate, and quality care. You may receive a survey regarding your visit with me, and I welcome your feedback! Thanks so much for taking the time to complete this. I look forward to seeing you again.   Annitta Needs, PhD, ANP-BC [facility]

## 2022-09-29 NOTE — Progress Notes (Signed)
Gastroenterology Office Note     Primary Care Physician:  Denyce Robert, FNP  Primary Gastroenterologist: Dr. Gala Romney    Chief Complaint   Chief Complaint  Patient presents with   Follow-up    Follow up lung nodule. Pt has no issues today     History of Present Illness   [name] is a 67 y.o. female presenting today with a history of  cirrhosis due to  NASH, GERD, constipation for routine follow-up.    Having a BM every 3 days. Has to strain. Abdominal cramping and bloating. Pantoprazole working well for GERD. No dysphagia. No rectal bleeding. No ETOH use. No confusion or mental status changes. No jaundice or pruritus.   Recent EGD Nov 2022 without varices. Next will be due in Nov 2025. Colonoscopy due in 2029. [place] due now. She is due for routine labs as well.   Past Medical History:  Diagnosis Date   Chronic constipation    Cirrhosis ([place])    child pugh class A and MELD 5, completed hep A and B vaccine series, no HCC on 3/16 MRI   Colonic inertia    Depression    Diverticula of colon    Diverticulosis 01/31/2008   colonoscopy Dr Gala Romney   GERD (gastroesophageal reflux disease)    Hyperplastic colon polyp    Liver mass    Macular degeneration    Obesity    PUD (peptic ulcer disease) 1988   Schizoaffective disorder    Mental Health-Dr Dekle   Seizures ([place])    as child; no medsd and has had no seizures since then.   Wrist fracture    Left wrist s/p fall and surgical repair    Past Surgical History:  Procedure Laterality Date   ABDOMINAL HYSTERECTOMY     CHOLECYSTECTOMY  08/2010   cholelithiasis; Dr Arnoldo Morale   COLONOSCOPY  01/31/2008   Dr. Gala Romney- normal rectum, L sided diverticula, long tortuous colon. hyperplasitic  polyp.   COLONOSCOPY WITH PROPOFOL N/A 10/19/2017    single 4 mm polyp in the sigmoid colon which was sessile, scattered small mouth diverticula in the sigmoid and descending colon, otherwise normal.  Surgical pathology found the polyp to  be hyperplastic.  Recommended 10-year repeat colonoscopy.   ERCP W/ SPHINCTEROTOMY AND BALLOON DILATION  08/05/2010   normal ampulla/mild erythema in the gastric remnant without ulcerations or erosions. normal retroflexed view of the cardia/anastomosis normal/distal common bile duct stones s/p extraction and shincterotomy   ESOPHAGOGASTRODUODENOSCOPY N/A 10/24/2013   [id number] esophagus. Surgically altered stomach. Abnormal gastric mucosa  -  status post biopsy (reactive gastrophathy)   ESOPHAGOGASTRODUODENOSCOPY (EGD) WITH PROPOFOL N/A 11/30/2015   LA grade A esophagitis, no varices, surveillance in 2019   ESOPHAGOGASTRODUODENOSCOPY (EGD) WITH PROPOFOL N/A 10/19/2017   erosive reflux esophagitis status post dilation, surgically altered stomach status post gastric bypass with a single patent apparent small bowel limb, small hiatal hernia.   ESOPHAGOGASTRODUODENOSCOPY (EGD) WITH PROPOFOL N/A 07/16/2021   normal esophagus, no varices, portal gastropathy. 3 year screening   EUS N/A 08/20/2015   Dr. Ardis Hughs: well-circumscribed 5.2 cm chronic mass in porta hepatis, internally mass is gelatinous. Small distal esophageal varices (limited view), Bilroth 1 type anatomy, cytology NEGATIVE for malignant cells. Suspicion for neoplasm low    FOOT SURGERY     left   GASTRIC BYPASS     with Revision, Dr. Welton Flakes DILATION N/A 10/19/2017   Procedure: Venia Minks DILATION;  Surgeon: Daneil Dolin, MD;  [facility];  Service: Endoscopy;  Laterality: N/A;   POLYPECTOMY  10/19/2017   Procedure: POLYPECTOMY;  Surgeon: Daneil Dolin, MD;  [facility];  Service: Endoscopy;;  sigmoid colon (CS)    REDUCTION MAMMAPLASTY Bilateral    S/P Hysterectomy     partial   TONSILLECTOMY     WRIST SURGERY Left 05/2016    Current Outpatient Medications  Medication Sig Dispense Refill   albuterol (VENTOLIN HFA) 108 (90 Base) MCG/ACT inhaler Inhale 1-2 puffs into the lungs every 6 (six)  hours as needed for shortness of breath or wheezing.     FLUoxetine (PROZAC) 40 MG capsule Take 40 mg by mouth in the morning.     ondansetron (ZOFRAN-ODT) 4 MG disintegrating tablet Take 1 tablet (4 mg total) by mouth every 8 (eight) hours as needed for nausea or vomiting. 20 tablet 0   pantoprazole (PROTONIX) 40 MG tablet TAKE 1 TABLET BY MOUTH DAILY 90 tablet 3   risperiDONE (RISPERDAL) 3 MG tablet Take 3 mg by mouth at bedtime.      traZODone (DESYREL) 150 MG tablet Take 300 mg by mouth at bedtime.     amoxicillin-clavulanate (AUGMENTIN) 875-125 MG tablet Take 1 tablet by mouth every 12 (twelve) hours. (Patient not taking: Reported on 09/29/2022) 14 tablet 0   fluticasone (FLONASE) 50 MCG/ACT nasal spray Place 2 sprays into both nostrils daily. (Patient not taking: Reported on 09/29/2022) 16 g 0   SYMBICORT 80-4.5 MCG/ACT inhaler Inhale 2 puffs into the lungs in the morning and at bedtime. (Patient not taking: Reported on 09/29/2022)     No current facility-administered medications for this visit.    Allergies as of 09/29/2022 - Review Complete 09/29/2022  Allergen Reaction Noted   Ciprofloxacin Rash    Kiwi extract Other (See Comments) 11/25/2015    Family History  Problem Relation Age of Onset   Diabetes Father    Colon cancer Maternal Uncle    Liver cancer Maternal Uncle        54 years old   Liver disease Neg Hx     Social History   Socioeconomic History   Marital status: Divorced    Spouse name: Not on file   Number of children: 2   Years of education: Not on file   Highest education level: Not on file  Occupational History   Occupation: disabled  Tobacco Use   Smoking status: Every Day    Packs/day: 1.00    Years: 36.00    Total pack years: 36.00    Types: Cigarettes   Smokeless tobacco: Never  Vaping Use   Vaping Use: Never used  Substance and Sexual Activity   Alcohol use: No    Alcohol/week: 0.0 standard drinks of alcohol   Drug use: Yes    Types:  Marijuana    Comment: 3 joints/day   Sexual activity: Never  Other Topics Concern   Not on file  Social History Narrative   Lives w/ mother   2 grown children   Social Determinants of Health   Financial Resource Strain: Not on file  Food Insecurity: Not on file  Transportation Needs: Not on file  Physical Activity: Not on file  Stress: Not on file  Social Connections: Not on file  Intimate Partner Violence: Not on file     Review of Systems   Gen: Denies any fever, chills, fatigue, weight loss, lack of appetite.  CV: Denies chest pain, heart palpitations, peripheral edema, syncope.  Resp:  Denies shortness of breath at rest or with exertion. Denies wheezing or cough.  GI: Denies dysphagia or odynophagia. Denies jaundice, hematemesis, fecal incontinence. GU : Denies urinary burning, urinary frequency, urinary hesitancy MS: Denies joint pain, muscle weakness, cramps, or limitation of movement.  Derm: Denies rash, itching, dry skin Psych: Denies depression, anxiety, memory loss, and confusion Heme: Denies bruising, bleeding, and enlarged lymph nodes.   Physical Exam   BP 109/72   Pulse 61   Temp (!) 97.4 F (36.3 C)   Ht '5\' 4"'$  (1.626 m)   Wt 223 lb 9.6 oz (101.4 kg)   BMI 38.38 kg/m  General:   Alert and oriented. Pleasant and cooperative. Well-nourished and well-developed.  Head:  Normocephalic and atraumatic. Eyes:  Without icterus Abdomen:  +BS, soft, non-tender and non-distended. No HSM noted. No guarding or rebound. No masses appreciated.  Rectal:  Deferred  Msk:  Symmetrical without gross deformities. Normal posture. Extremities:  Without edema. Neurologic:  Alert and  oriented x4;  grossly normal neurologically. Skin:  Intact without significant lesions or rashes. Psych:  Alert and cooperative. Normal mood and affect.   Assessment   [name] is a 67 y.o. female presenting today in follow-up with a history of cirrhosis due to  NASH, GERD,  constipation for routine follow-up.   Cirrhosis: well-compensated. Labs and [place] due now. EGD due in 2025.   Constipation: not on bowel regimen currently. Trial of Linzess 290 mcg daily  GERD: doing well on pantoprazole without alarm signs/symptoms.    Recent EGD Nov 2022 without varices. Next will be due in Nov 2025. Colonoscopy due in 2029. [place] due now. She is due for routine labs as well.    PLAN    Trial of Linzess 290 mcg daily. Samples provided. Call with update Pantoprazole daily RUQ [place] and labs ordered EGD in Nov 2025, colonoscopy 2029   Annitta Needs, PhD, ANP-BC [facility] Gastroenter[facility]

## 2022-10-05 ENCOUNTER — Ambulatory Visit (HOSPITAL_COMMUNITY)
Admission: RE | Admit: 2022-10-05 | Discharge: 2022-10-05 | Disposition: A | Discharge status: Home / Self Care | Payer: 59 | Source: Ambulatory Visit | Attending: Gastroenterology | Admitting: Gastroenterology

## 2022-10-05 DIAGNOSIS — K7460 Unspecified cirrhosis of liver: Secondary | ICD-10-CM | POA: Diagnosis present

## 2022-12-29 ENCOUNTER — Ambulatory Visit (INDEPENDENT_AMBULATORY_CARE_PROVIDER_SITE_OTHER): Payer: 59 | Admitting: Gastroenterology

## 2022-12-29 ENCOUNTER — Encounter: Payer: Self-pay | Admitting: Gastroenterology

## 2022-12-29 VITALS — BP 96/66 | HR 80 | Temp 97.4°F | Ht 64.0 in | Wt 220.6 lb

## 2022-12-29 DIAGNOSIS — K7469 Other cirrhosis of liver: Secondary | ICD-10-CM | POA: Diagnosis not present

## 2022-12-29 DIAGNOSIS — K5900 Constipation, unspecified: Secondary | ICD-10-CM | POA: Diagnosis not present

## 2022-12-29 MED ORDER — LINACLOTIDE 290 MCG PO CAPS: 290.0000 ug | ORAL_CAPSULE | Freq: Every day | ORAL | 3 refills | Status: DC

## 2022-12-29 MED ORDER — ONDANSETRON HCL 4 MG PO TABS: 4.0000 mg | ORAL_TABLET | Freq: Three times a day (TID) | ORAL | 1 refills | Status: DC | PRN

## 2022-12-29 NOTE — Patient Instructions (Signed)
I have sent Linzess to the mail order pharmacy. If this is covered, then that's great! If not, then you can add Miralax in 1 capful each evening in 8 ounces of water as needed.   I also recommend Benefiber or similar: take 2 teaspoons each morning in beverage of your choice.  Please have routine blood work done.  Your next ultrasound will be in July 2024: we will send a letter when it gets closer to time.  We will see you in 6 months!  I enjoyed seeing you again today! At our first visit, I mentioned how I value our relationship and want to provide genuine, compassionate, and quality care. You may receive a survey regarding your visit with me, and I welcome your feedback! Thanks so much for taking the time to complete this. I look forward to seeing you again.   Gelene Mink, PhD, ANP-BC [facility]astroenter[facility]

## 2022-12-29 NOTE — Progress Notes (Signed)
Gastroenterology Office Note     Primary Care Physician:  Marylynn Pearson, FNP  Primary Gastroenterologist: Dr. Jena Gauss    Chief Complaint   Chief Complaint  Patient presents with   Follow-up    Follow up on constipation. Pt states it helps (Linzess 290) but her insurance may not pay     History of Present Illness   [name] is a 67 y.o. female presenting today in follow-up with a history of cirrhosis due to  NASH, GERD, and constipation.    Last seen in Jan 2024 with samples of Linzess 290 mcg provided. Linzess worked well. She is concerned this won't be covered by insurance.   BM every other day. Some straining at times. No Miralax. Stool not productive. Tried Metamcuil but she vomited. Abdomen feels bloated at times but better after BM.   No GERD. No rectal bleeding. No jaundice or pruritus. Denies lower extremity edema. No mental status changes.      EGD Nov 2022 without varices. Next will be due in Nov 2025.  Colonoscopy due in 2029.  Nex [place] due in July 2024.  Needs updated labs.      Past Medical History:  Diagnosis Date   Chronic constipation    Cirrhosis    child pugh class A and MELD 5, completed hep A and B vaccine series, no HCC on 3/16 MRI   Colonic inertia    Depression    Diverticula of colon    Diverticulosis 01/31/2008   colonoscopy Dr Jena Gauss   GERD (gastroesophageal reflux disease)    Hyperplastic colon polyp    Liver mass    Macular degeneration    Obesity    PUD (peptic ulcer disease) 1988   Schizoaffective disorder    Mental Health-Dr Dekle   Seizures    as child; no medsd and has had no seizures since then.   Wrist fracture    Left wrist s/p fall and surgical repair    Past Surgical History:  Procedure Laterality Date   ABDOMINAL HYSTERECTOMY     CHOLECYSTECTOMY  08/2010   cholelithiasis; Dr Lovell Sheehan   COLONOSCOPY  01/31/2008   Dr. Jena Gauss- normal rectum, L sided diverticula, long tortuous colon. hyperplasitic  polyp.    COLONOSCOPY WITH PROPOFOL N/A 10/19/2017    single 4 mm polyp in the sigmoid colon which was sessile, scattered small mouth diverticula in the sigmoid and descending colon, otherwise normal.  Surgical pathology found the polyp to be hyperplastic.  Recommended 10-year repeat colonoscopy.   ERCP W/ SPHINCTEROTOMY AND BALLOON DILATION  08/05/2010   normal ampulla/mild erythema in the gastric remnant without ulcerations or erosions. normal retroflexed view of the cardia/anastomosis normal/distal common bile duct stones s/p extraction and shincterotomy   ESOPHAGOGASTRODUODENOSCOPY N/A 10/24/2013   [id number] esophagus. Surgically altered stomach. Abnormal gastric mucosa  -  status post biopsy (reactive gastrophathy)   ESOPHAGOGASTRODUODENOSCOPY (EGD) WITH PROPOFOL N/A 11/30/2015   LA grade A esophagitis, no varices, surveillance in 2019   ESOPHAGOGASTRODUODENOSCOPY (EGD) WITH PROPOFOL N/A 10/19/2017   erosive reflux esophagitis status post dilation, surgically altered stomach status post gastric bypass with a single patent apparent small bowel limb, small hiatal hernia.   ESOPHAGOGASTRODUODENOSCOPY (EGD) WITH PROPOFOL N/A 07/16/2021   normal esophagus, no varices, portal gastropathy. 3 year screening   EUS N/A 08/20/2015   Dr. Christella Hartigan: well-circumscribed 5.2 cm chronic mass in porta hepatis, internally mass is gelatinous. Small distal esophageal varices (limited view), Bilroth 1 type anatomy, cytology NEGATIVE for malignant  cells. Suspicion for neoplasm low    FOOT SURGERY     left   GASTRIC BYPASS     with Revision, Dr. Rickard Patience DILATION N/A 10/19/2017   Procedure: Elease Hashimoto DILATION;  Surgeon: Corbin Ade, MD;  [facility];  Service: Endoscopy;  Laterality: N/A;   POLYPECTOMY  10/19/2017   Procedure: POLYPECTOMY;  Surgeon: Corbin Ade, MD;  [facility];  Service: Endoscopy;;  sigmoid colon (CS)    REDUCTION MAMMAPLASTY Bilateral    S/P Hysterectomy      partial   TONSILLECTOMY     WRIST SURGERY Left 05/2016    Current Outpatient Medications  Medication Sig Dispense Refill   albuterol (VENTOLIN HFA) 108 (90 Base) MCG/ACT inhaler Inhale 1-2 puffs into the lungs every 6 (six) hours as needed for shortness of breath or wheezing.     FLUoxetine (PROZAC) 40 MG capsule Take 40 mg by mouth in the morning.     ondansetron (ZOFRAN-ODT) 4 MG disintegrating tablet Take 1 tablet (4 mg total) by mouth every 8 (eight) hours as needed for nausea or vomiting. 20 tablet 0   pantoprazole (PROTONIX) 40 MG tablet TAKE 1 TABLET BY MOUTH DAILY 90 tablet 3   risperiDONE (RISPERDAL) 3 MG tablet Take 3 mg by mouth at bedtime.      traZODone (DESYREL) 150 MG tablet Take 300 mg by mouth at bedtime.     amoxicillin-clavulanate (AUGMENTIN) 875-125 MG tablet Take 1 tablet by mouth every 12 (twelve) hours. (Patient not taking: Reported on 09/29/2022) 14 tablet 0   fluticasone (FLONASE) 50 MCG/ACT nasal spray Place 2 sprays into both nostrils daily. (Patient not taking: Reported on 09/29/2022) 16 g 0   SYMBICORT 80-4.5 MCG/ACT inhaler Inhale 2 puffs into the lungs in the morning and at bedtime. (Patient not taking: Reported on 09/29/2022)     No current facility-administered medications for this visit.    Allergies as of 12/29/2022 - Review Complete 12/29/2022  Allergen Reaction Noted   Ciprofloxacin Rash    Kiwi extract Other (See Comments) 11/25/2015    Family History  Problem Relation Age of Onset   Diabetes Father    Colon cancer Maternal Uncle    Liver cancer Maternal Uncle        67 years old   Liver disease Neg Hx     Social History   Socioeconomic History   Marital status: Divorced    Spouse name: Not on file   Number of children: 2   Years of education: Not on file   Highest education level: Not on file  Occupational History   Occupation: disabled  Tobacco Use   Smoking status: Every Day    Packs/day: 1.00    Years: 36.00    Additional pack  years: 0.00    Total pack years: 36.00    Types: Cigarettes   Smokeless tobacco: Never  Vaping Use   Vaping Use: Never used  Substance and Sexual Activity   Alcohol use: No    Alcohol/week: 0.0 standard drinks of alcohol   Drug use: Yes    Types: Marijuana    Comment: 3 joints/day   Sexual activity: Never  Other Topics Concern   Not on file  Social History Narrative   Lives w/ mother   2 grown children   Social Determinants of Health   Financial Resource Strain: Not on file  Food Insecurity: Not on file  Transportation Needs: Not on file  Physical Activity:  Not on file  Stress: Not on file  Social Connections: Not on file  Intimate Partner Violence: Not on file     Review of Systems   Gen: Denies any fever, chills, fatigue, weight loss, lack of appetite.  CV: Denies chest pain, heart palpitations, peripheral edema, syncope.  Resp: Denies shortness of breath at rest or with exertion. Denies wheezing or cough.  GI: Denies dysphagia or odynophagia. Denies jaundice, hematemesis, fecal incontinence. GU : Denies urinary burning, urinary frequency, urinary hesitancy MS: Denies joint pain, muscle weakness, cramps, or limitation of movement.  Derm: Denies rash, itching, dry skin Psych: Denies depression, anxiety, memory loss, and confusion Heme: Denies bruising, bleeding, and enlarged lymph nodes.   Physical Exam   BP 96/66   Pulse 80   Temp (!) 97.4 F (36.3 C)   Ht 5\' 4"  (1.626 m)   Wt 220 lb 9.6 oz (100.1 kg)   BMI 37.87 kg/m  General:   Alert and oriented. Pleasant and cooperative. Well-nourished and well-developed.  Head:  Normocephalic and atraumatic. Eyes:  Without icterus Abdomen:  +BS, soft, non-tender and non-distended. No HSM noted. No guarding or rebound. No masses appreciated.  Rectal:  Deferred  Msk:  Symmetrical without gross deformities. Normal posture. Extremities:  Without edema. Neurologic:  Alert and  oriented x4;  grossly normal  neurologically. Skin:  Intact without significant lesions or rashes. Psych:  Alert and cooperative. Normal mood and affect.   Assessment   [name] is a 67 y.o. female presenting today in follow-up with a history of cirrhosis due to  NASH, GERD, and constipation.   Cirrhosis: well-compensated. Labs due now. RUQ [place] on file from Jan 2024. Next in July 2024 and on recall. EGD in 2025  Constipation: responded well to LInzess 290 mcg samples. She is concerned this may not be covered by insurance. I have sent to her pharmacy. In interim, add Benefiber daily and Miralax prn.  GERD: controlled on PPI daily.      PLAN    CBC, CMP, INR, AFP today RUQ [place] in July 2024 PPI daily Zofran refilled Linzess 290 mcg daily sent to pharmacy. If not covered, trial Benefiber and Miralax EGD in Nov 2025 Colonoscopy in 2029    Gelene Mink, PhD, ANP-BC [facility] Gastroenterology

## 2023-03-17 ENCOUNTER — Encounter: Payer: Self-pay | Admitting: Internal Medicine

## 2023-03-27 ENCOUNTER — Telehealth: Payer: Self-pay | Admitting: *Deleted

## 2023-03-27 DIAGNOSIS — K7469 Other cirrhosis of liver: Secondary | ICD-10-CM

## 2023-03-27 NOTE — Telephone Encounter (Signed)
Received VM from pt she is due for RUQ [place].  Called pt back and gave [place] APPT DETAILS. She voiced understanding.

## 2023-04-06 ENCOUNTER — Ambulatory Visit (HOSPITAL_COMMUNITY)
Admission: RE | Admit: 2023-04-06 | Discharge: 2023-04-06 | Disposition: A | Discharge status: Home / Self Care | Payer: 59 | Source: Ambulatory Visit | Attending: Gastroenterology | Admitting: Gastroenterology

## 2023-04-06 DIAGNOSIS — K7469 Other cirrhosis of liver: Secondary | ICD-10-CM | POA: Diagnosis present

## 2023-05-15 ENCOUNTER — Encounter: Payer: Self-pay | Admitting: Gastroenterology

## 2023-06-10 ENCOUNTER — Other Ambulatory Visit: Payer: Self-pay | Admitting: Gastroenterology

## 2023-06-10 DIAGNOSIS — K219 Gastro-esophageal reflux disease without esophagitis: Secondary | ICD-10-CM

## 2023-06-12 NOTE — Telephone Encounter (Signed)
Phoned the pt and no answer

## 2023-06-12 NOTE — Telephone Encounter (Signed)
Pt aware of Rx being sent ?

## 2023-06-15 ENCOUNTER — Encounter: Payer: Self-pay | Admitting: Gastroenterology

## 2023-06-15 ENCOUNTER — Ambulatory Visit (INDEPENDENT_AMBULATORY_CARE_PROVIDER_SITE_OTHER): Payer: 59 | Admitting: Gastroenterology

## 2023-06-15 VITALS — BP 111/73 | HR 90 | Temp 97.9°F | Ht 64.0 in | Wt 211.2 lb

## 2023-06-15 DIAGNOSIS — K5900 Constipation, unspecified: Secondary | ICD-10-CM | POA: Diagnosis not present

## 2023-06-15 DIAGNOSIS — K7581 Nonalcoholic steatohepatitis (NASH): Secondary | ICD-10-CM

## 2023-06-15 DIAGNOSIS — R110 Nausea: Secondary | ICD-10-CM

## 2023-06-15 DIAGNOSIS — K219 Gastro-esophageal reflux disease without esophagitis: Secondary | ICD-10-CM | POA: Diagnosis not present

## 2023-06-15 DIAGNOSIS — K7460 Unspecified cirrhosis of liver: Secondary | ICD-10-CM

## 2023-06-15 MED ORDER — LINACLOTIDE 290 MCG PO CAPS: 290.0000 ug | ORAL_CAPSULE | Freq: Every day | ORAL | 3 refills | Status: DC

## 2023-06-15 MED ORDER — ONDANSETRON HCL 4 MG PO TABS: 4.0000 mg | ORAL_TABLET | Freq: Three times a day (TID) | ORAL | 5 refills | Status: AC | PRN

## 2023-06-15 NOTE — Progress Notes (Signed)
Gastroenterology Office Note     Primary Care Physician:  Marylynn Pearson, FNP  Primary Gastroenterologist: Dr. Jena Gauss    Chief Complaint   Chief Complaint  Patient presents with   Follow-up    Doing well     History of Present Illness   [name] is a 67 y.o. female presenting today with a history of  cirrhosis due to MASH, GERD, and constipation.   Occasional nausea if eats something "funny". Takes Zofran with good results. No dysphagia. GERD controlled on pantoprazole. Linzess 290 mcg daily with BM about daily. No straining. No mental status changes or confusion. No alcohol use. No jaundice or pruritus. No lower extremity edema or ascites. No overt GI bleeding. No concerns today.    EGD Nov 2022 without varices. Next will be due in Nov 2025.  Colonoscopy due in 2029.    Cirrhosis care: [place] on file from Aug 2024 and due again in Feb 2025.  Needs updated labs as overdue.   Past Medical History:  Diagnosis Date   Chronic constipation    Cirrhosis (HCC)    child pugh class A and MELD 5, completed hep A and B vaccine series, no HCC on 3/16 MRI   Colonic inertia    Depression    Diverticula of colon    Diverticulosis 01/31/2008   colonoscopy Dr Jena Gauss   GERD (gastroesophageal reflux disease)    Hyperplastic colon polyp    Liver mass    Macular degeneration    Obesity    PUD (peptic ulcer disease) 1988   Schizoaffective disorder    Mental Health-Dr Dekle   Seizures (HCC)    as child; no medsd and has had no seizures since then.   Wrist fracture    Left wrist s/p fall and surgical repair    Past Surgical History:  Procedure Laterality Date   ABDOMINAL HYSTERECTOMY     CHOLECYSTECTOMY  08/2010   cholelithiasis; Dr Lovell Sheehan   COLONOSCOPY  01/31/2008   Dr. Jena Gauss- normal rectum, L sided diverticula, long tortuous colon. hyperplasitic  polyp.   COLONOSCOPY WITH PROPOFOL N/A 10/19/2017    single 4 mm polyp in the sigmoid colon which was sessile, scattered  small mouth diverticula in the sigmoid and descending colon, otherwise normal.  Surgical pathology found the polyp to be hyperplastic.  Recommended 10-year repeat colonoscopy.   ERCP W/ SPHINCTEROTOMY AND BALLOON DILATION  08/05/2010   normal ampulla/mild erythema in the gastric remnant without ulcerations or erosions. normal retroflexed view of the cardia/anastomosis normal/distal common bile duct stones s/p extraction and shincterotomy   ESOPHAGOGASTRODUODENOSCOPY N/A 10/24/2013   MWN:UUVOZD esophagus. Surgically altered stomach. Abnormal gastric mucosa  -  status post biopsy (reactive gastrophathy)   ESOPHAGOGASTRODUODENOSCOPY (EGD) WITH PROPOFOL N/A 11/30/2015   LA grade A esophagitis, no varices, surveillance in 2019   ESOPHAGOGASTRODUODENOSCOPY (EGD) WITH PROPOFOL N/A 10/19/2017   erosive reflux esophagitis status post dilation, surgically altered stomach status post gastric bypass with a single patent apparent small bowel limb, small hiatal hernia.   ESOPHAGOGASTRODUODENOSCOPY (EGD) WITH PROPOFOL N/A 07/16/2021   normal esophagus, no varices, portal gastropathy. 3 year screening   EUS N/A 08/20/2015   Dr. Christella Hartigan: well-circumscribed 5.2 cm chronic mass in porta hepatis, internally mass is gelatinous. Small distal esophageal varices (limited view), Bilroth 1 type anatomy, cytology NEGATIVE for malignant cells. Suspicion for neoplasm low    FOOT SURGERY     left   GASTRIC BYPASS     with Revision, Dr. Dione Booze  MALONEY DILATION N/A 10/19/2017   Procedure: Elease Hashimoto DILATION;  Surgeon: Corbin Ade, MD;  Location: [facility];  Service: Endoscopy;  Laterality: N/A;   POLYPECTOMY  10/19/2017   Procedure: POLYPECTOMY;  Surgeon: Corbin Ade, MD;  Location: [facility];  Service: Endoscopy;;  sigmoid colon (CS)    REDUCTION MAMMAPLASTY Bilateral    S/P Hysterectomy     partial   TONSILLECTOMY     WRIST SURGERY Left 05/2016    Current Outpatient Medications  Medication Sig  Dispense Refill   albuterol (VENTOLIN HFA) 108 (90 Base) MCG/ACT inhaler Inhale 1-2 puffs into the lungs every 6 (six) hours as needed for shortness of breath or wheezing.     FLUoxetine (PROZAC) 40 MG capsule Take 40 mg by mouth in the morning.     linaclotide (LINZESS) 290 MCG CAPS capsule Take 1 capsule (290 mcg total) by mouth daily before breakfast. 90 capsule 3   methocarbamol (ROBAXIN) 500 MG tablet Take 500 mg by mouth 4 (four) times daily.     ondansetron (ZOFRAN) 4 MG tablet Take 1 tablet (4 mg total) by mouth every 8 (eight) hours as needed for nausea or vomiting. 30 tablet 1   pantoprazole (PROTONIX) 40 MG tablet TAKE 1 TABLET BY MOUTH DAILY 100 tablet 2   risperiDONE (RISPERDAL) 3 MG tablet Take 3 mg by mouth at bedtime.      traZODone (DESYREL) 150 MG tablet Take 300 mg by mouth at bedtime.     No current facility-administered medications for this visit.    Allergies as of 06/15/2023 - Review Complete 06/15/2023  Allergen Reaction Noted   Ciprofloxacin Rash    Kiwi extract Other (See Comments) 11/25/2015    Family History  Problem Relation Age of Onset   Diabetes Father    Colon cancer Maternal Uncle    Liver cancer Maternal Uncle        66 years old   Liver disease Neg Hx     Social History   Socioeconomic History   Marital status: Divorced    Spouse name: Not on file   Number of children: 2   Years of education: Not on file   Highest education level: Not on file  Occupational History   Occupation: disabled  Tobacco Use   Smoking status: Every Day    Current packs/day: 1.00    Average packs/day: 1 pack/day for 36.0 years (36.0 ttl pk-yrs)    Types: Cigarettes   Smokeless tobacco: Never  Vaping Use   Vaping status: Never Used  Substance and Sexual Activity   Alcohol use: No    Alcohol/week: 0.0 standard drinks of alcohol   Drug use: Yes    Types: Marijuana    Comment: 3 joints/day   Sexual activity: Never  Other Topics Concern   Not on file  Social  History Narrative   Lives w/ mother   2 grown children   Social Determinants of Health   Financial Resource Strain: Not on file  Food Insecurity: Not on file  Transportation Needs: Not on file  Physical Activity: Not on file  Stress: Not on file  Social Connections: Not on file  Intimate Partner Violence: Not on file     Review of Systems   Gen: Denies any fever, chills, fatigue, weight loss, lack of appetite.  CV: Denies chest pain, heart palpitations, peripheral edema, syncope.  Resp: Denies shortness of breath at rest or with exertion. Denies wheezing or cough.  GI: Denies dysphagia  or odynophagia. Denies jaundice, hematemesis, fecal incontinence. GU : Denies urinary burning, urinary frequency, urinary hesitancy MS: Denies joint pain, muscle weakness, cramps, or limitation of movement.  Derm: Denies rash, itching, dry skin Psych: Denies depression, anxiety, memory loss, and confusion Heme: Denies bruising, bleeding, and enlarged lymph nodes.   Physical Exam   BP 111/73 (BP Location: Right Arm, Patient Position: Sitting, Cuff Size: Large)   Pulse 90   Temp 97.9 F (36.6 C) (Oral)   Ht 5\' 4"  (1.626 m)   Wt 211 lb 3.2 oz (95.8 kg)   SpO2 90%   BMI 36.25 kg/m  General:   Alert and oriented. Pleasant and cooperative. Well-nourished and well-developed.  Head:  Normocephalic and atraumatic. Eyes:  Without icterus Abdomen:  +BS, soft, non-tender and non-distended. No HSM noted. No guarding or rebound. No masses appreciated.  Rectal:  Deferred  Msk:  Symmetrical without gross deformities. Normal posture. Extremities:  Without edema. Neurologic:  Alert and  oriented x4;  grossly normal neurologically. Skin:  Intact without significant lesions or rashes. Psych:  Alert and cooperative. Normal mood and affect.   Assessment   [name] is a 67 y.o. female presenting today with a history of cirrhosis due to MASH, GERD, and constipation.   MASH cirrhosis:  well-compensated. Overdue for labs (last done a year ago). Will check MELD and AFP. [place] due in Feb 2025. Completed Hep A/B vaccines in past.   GERD: continue PPI daily. Occasional nausea likely due to dietary intake. No alarm signs/symptoms.   Constipation: doing well on Linzess 290 mcg daily.      PLAN    MELD labs and AFP  RUQ [place] in Feb 2025 6 month follow-up Linzess 290 mcg and PPI refilled EGD due in Nov 2025 Colonoscopy 2029 unless clinical changes   Gelene Mink, PhD, [facility] Gastroenterology

## 2023-06-15 NOTE — Patient Instructions (Signed)
Please have blood work done.  Your next ultrasound will be due in February!  We will see you in 6 months!  I enjoyed seeing you again today! I value our relationship and want to provide genuine, compassionate, and quality care. You may receive a survey regarding your visit with me, and I welcome your feedback! Thanks so much for taking the time to complete this. I look forward to seeing you again.      Gelene Mink, PhD, ANP-BC [facility] Gastroenter[facility]

## 2023-07-14 LAB — CBC WITH DIFFERENTIAL/PLATELET
Basophils Absolute: 0.1 10*3/uL (ref 0.0–0.2)
Basos: 1 %
EOS (ABSOLUTE): 0.3 10*3/uL (ref 0.0–0.4)
Eos: 3 %
Hematocrit: 43.9 % (ref 34.0–46.6)
Hemoglobin: 13.4 g/dL (ref 11.1–15.9)
Immature Grans (Abs): 0 10*3/uL (ref 0.0–0.1)
Immature Granulocytes: 0 %
Lymphocytes Absolute: 1.7 10*3/uL (ref 0.7–3.1)
Lymphs: 19 %
MCH: 28.5 pg (ref 26.6–33.0)
MCHC: 30.5 g/dL — ABNORMAL LOW (ref 31.5–35.7)
MCV: 93 fL (ref 79–97)
Monocytes Absolute: 0.8 10*3/uL (ref 0.1–0.9)
Monocytes: 9 %
Neutrophils Absolute: 6.3 10*3/uL (ref 1.4–7.0)
Neutrophils: 68 %
Platelets: 388 10*3/uL (ref 150–450)
RBC: 4.7 x10E6/uL (ref 3.77–5.28)
RDW: 13.5 % (ref 11.7–15.4)
WBC: 9.2 10*3/uL (ref 3.4–10.8)

## 2023-07-14 LAB — COMPREHENSIVE METABOLIC PANEL
ALT: 9 [IU]/L (ref 0–32)
AST: 12 [IU]/L (ref 0–40)
Albumin: 3.8 g/dL — ABNORMAL LOW (ref 3.9–4.9)
Alkaline Phosphatase: 123 [IU]/L — ABNORMAL HIGH (ref 44–121)
BUN/Creatinine Ratio: 9 — ABNORMAL LOW (ref 12–28)
BUN: 10 mg/dL (ref 8–27)
Bilirubin Total: 0.2 mg/dL (ref 0.0–1.2)
CO2: 23 mmol/L (ref 20–29)
Calcium: 9.1 mg/dL (ref 8.7–10.3)
Chloride: 106 mmol/L (ref 96–106)
Creatinine, Ser: 1.15 mg/dL — ABNORMAL HIGH (ref 0.57–1.00)
Globulin, Total: 2.4 g/dL (ref 1.5–4.5)
Glucose: 93 mg/dL (ref 70–99)
Potassium: 4.4 mmol/L (ref 3.5–5.2)
Sodium: 142 mmol/L (ref 134–144)
Total Protein: 6.2 g/dL (ref 6.0–8.5)
eGFR: 52 mL/min/{1.73_m2} — ABNORMAL LOW (ref >59)

## 2023-07-14 LAB — AFP TUMOR MARKER: AFP, Serum, Tumor Marker: 2.2 ng/mL (ref 0.0–9.2)

## 2023-07-14 LAB — PROTIME-INR
INR: 1 (ref 0.9–1.2)
Prothrombin Time: 11.3 s (ref 9.1–12.0)

## 2023-09-07 ENCOUNTER — Encounter: Payer: Self-pay | Admitting: Gastroenterology

## 2023-09-20 ENCOUNTER — Telehealth: Payer: Self-pay | Admitting: *Deleted

## 2023-09-20 ENCOUNTER — Other Ambulatory Visit (HOSPITAL_COMMUNITY): Payer: Self-pay | Admitting: Family Medicine

## 2023-09-20 ENCOUNTER — Other Ambulatory Visit: Payer: Self-pay | Admitting: *Deleted

## 2023-09-20 DIAGNOSIS — Z1231 Encounter for screening mammogram for malignant neoplasm of breast: Secondary | ICD-10-CM

## 2023-09-20 DIAGNOSIS — K7460 Unspecified cirrhosis of liver: Secondary | ICD-10-CM

## 2023-09-20 NOTE — Telephone Encounter (Signed)
 Pt left vm stating she received letter to schedule US .  Pt informed that US  is scheduled for 10/13/23, arrive at 9:15 am to check in, NPO after midnight. Verbalized understanding

## 2023-09-27 ENCOUNTER — Encounter (HOSPITAL_COMMUNITY): Payer: Self-pay

## 2023-09-27 ENCOUNTER — Ambulatory Visit (HOSPITAL_COMMUNITY)
Admission: RE | Admit: 2023-09-27 | Discharge: 2023-09-27 | Disposition: A | Discharge status: Home / Self Care | Payer: 59 | Source: Ambulatory Visit | Attending: Family Medicine | Admitting: Family Medicine

## 2023-09-27 DIAGNOSIS — Z1231 Encounter for screening mammogram for malignant neoplasm of breast: Secondary | ICD-10-CM

## 2023-10-09 ENCOUNTER — Encounter (HOSPITAL_COMMUNITY): Payer: Self-pay

## 2023-10-09 ENCOUNTER — Emergency Department (HOSPITAL_COMMUNITY): Payer: 59

## 2023-10-09 ENCOUNTER — Other Ambulatory Visit: Payer: Self-pay

## 2023-10-09 ENCOUNTER — Emergency Department (HOSPITAL_COMMUNITY)
Admission: EM | Admit: 2023-10-09 | Discharge: 2023-10-09 | Disposition: A | Discharge status: Home / Self Care | Payer: 59 | Attending: Emergency Medicine | Admitting: Emergency Medicine

## 2023-10-09 DIAGNOSIS — Z79899 Other long term (current) drug therapy: Secondary | ICD-10-CM | POA: Diagnosis not present

## 2023-10-09 DIAGNOSIS — J101 Influenza due to other identified influenza virus with other respiratory manifestations: Secondary | ICD-10-CM | POA: Diagnosis not present

## 2023-10-09 DIAGNOSIS — F17200 Nicotine dependence, unspecified, uncomplicated: Secondary | ICD-10-CM | POA: Insufficient documentation

## 2023-10-09 DIAGNOSIS — J111 Influenza due to unidentified influenza virus with other respiratory manifestations: Secondary | ICD-10-CM

## 2023-10-09 DIAGNOSIS — J449 Chronic obstructive pulmonary disease, unspecified: Secondary | ICD-10-CM | POA: Insufficient documentation

## 2023-10-09 DIAGNOSIS — Z7951 Long term (current) use of inhaled steroids: Secondary | ICD-10-CM | POA: Diagnosis not present

## 2023-10-09 DIAGNOSIS — Z20822 Contact with and (suspected) exposure to covid-19: Secondary | ICD-10-CM | POA: Insufficient documentation

## 2023-10-09 DIAGNOSIS — I10 Essential (primary) hypertension: Secondary | ICD-10-CM | POA: Insufficient documentation

## 2023-10-09 DIAGNOSIS — R059 Cough, unspecified: Secondary | ICD-10-CM | POA: Diagnosis present

## 2023-10-09 LAB — RESP PANEL BY RT-PCR (RSV, FLU A&B, COVID)  RVPGX2
Influenza A by PCR: POSITIVE — AB
Influenza B by PCR: NEGATIVE
Resp Syncytial Virus by PCR: NEGATIVE
SARS Coronavirus 2 by RT PCR: NEGATIVE

## 2023-10-09 MED ORDER — BENZONATATE 100 MG PO CAPS: 100.0000 mg | ORAL_CAPSULE | Freq: Three times a day (TID) | ORAL | 0 refills | Status: DC

## 2023-10-09 MED ORDER — ACETAMINOPHEN 325 MG PO TABS
650.0000 mg | ORAL_TABLET | Freq: Once | ORAL | Status: AC
  Administered 2023-10-09: 650 mg via ORAL
  Filled 2023-10-09: qty 2

## 2023-10-09 MED ORDER — ALBUTEROL SULFATE HFA 108 (90 BASE) MCG/ACT IN AERS
2.0000 | INHALATION_SPRAY | RESPIRATORY_TRACT | Status: DC | PRN
  Administered 2023-10-09: 2 via RESPIRATORY_TRACT
  Filled 2023-10-09: qty 6.7

## 2023-10-09 MED ORDER — ALBUTEROL SULFATE HFA 108 (90 BASE) MCG/ACT IN AERS: 1.0000 | INHALATION_SPRAY | Freq: Four times a day (QID) | RESPIRATORY_TRACT | 1 refills | Status: DC | PRN

## 2023-10-09 NOTE — ED Provider Notes (Signed)
[place] EMERGENCY DEPARTMENT AT [facility] Provider Note   CSN: [id number] Arrival date & time: 10/09/23  1651     History  Chief Complaint  Patient presents with   Cough    [name] is a 68 y.o. female.   Cough Associated symptoms: no chest pain, no chills, no fever and no myalgias         [name] is a 68 y.o. female with past medical history of schizoaffective disorder, GERD, seizures, peptic ulcer disease, who presents to the Emergency Department complaining of persistent cough x 3 days.  States cough is mostly nonproductive.  She has some shortness of breath associated with coughing only.  Denies any hemoptysis and chest pain.  No reported fever.  No nausea or vomiting.  Using albuterol inhaler without improvement.   Home Medications Prior to Admission medications   Medication Sig Start Date End Date Taking? Authorizing Provider  benzonatate (TESSALON) 100 MG capsule Take 1 capsule (100 mg total) by mouth every 8 (eight) hours. 10/09/23  Yes Eber Hong, MD  albuterol (VENTOLIN HFA) 108 (90 Base) MCG/ACT inhaler Inhale 1-2 puffs into the lungs every 6 (six) hours as needed for shortness of breath or wheezing. 03/24/20   [provider]  FLUoxetine (PROZAC) 40 MG capsule Take 40 mg by mouth in the morning. 06/06/12   [provider]  linaclotide Karlene Einstein) 290 MCG CAPS capsule Take 1 capsule (290 mcg total) by mouth daily before breakfast. 06/15/23   Gelene Mink, NP  methocarbamol (ROBAXIN) 500 MG tablet Take 500 mg by mouth 4 (four) times daily.    [provider]  ondansetron (ZOFRAN) 4 MG tablet Take 1 tablet (4 mg total) by mouth every 8 (eight) hours as needed for nausea or vomiting. 06/15/23   Gelene Mink, NP  pantoprazole (PROTONIX) 40 MG tablet TAKE 1 TABLET BY MOUTH DAILY 06/12/23   Gelene Mink, NP  risperiDONE (RISPERDAL) 3 MG tablet Take 3 mg by mouth at bedtime.     [provider]  traZODone  (DESYREL) 150 MG tablet Take 300 mg by mouth at bedtime. 07/20/15   [provider]      Allergies    Ciprofloxacin and Kiwi extract    Review of Systems   Review of Systems  Constitutional:  Negative for appetite change, chills and fever.  HENT:  Negative for congestion.   Respiratory:  Positive for cough.   Cardiovascular:  Negative for chest pain.  Gastrointestinal:  Negative for abdominal pain, diarrhea, nausea and vomiting.  Genitourinary:  Negative for dysuria and flank pain.  Musculoskeletal:  Negative for myalgias.  Neurological:  Negative for dizziness and weakness.    Physical Exam Updated Vital Signs BP (!) 149/117 (BP Location: Right Arm)   Pulse 86   Temp (!) 100.5 F (38.1 C) (Oral)   Resp 18   Ht 5\' 4"  (1.626 m)   Wt 96 kg   SpO2 92%   BMI 36.33 kg/m  Physical Exam Vitals and nursing note reviewed.  Constitutional:      General: She is not in acute distress.    Appearance: Normal appearance. She is not ill-appearing or toxic-appearing.  Cardiovascular:     Rate and Rhythm: Normal rate and regular rhythm.     Pulses: Normal pulses.  Pulmonary:     Effort: Pulmonary effort is normal. No respiratory distress.     Breath sounds: No rales.     Comments: Patient actively  coughing during my exam.  Mild scattered rhonchi, mild expiratory wheezing noted.  No rales no increased work of breathing Abdominal:     Palpations: Abdomen is soft.     Tenderness: There is no abdominal tenderness.  Musculoskeletal:        General: Normal range of motion.     Right lower leg: No edema.     Left lower leg: No edema.  Skin:    General: Skin is warm.     Capillary Refill: Capillary refill takes less than 2 seconds.  Neurological:     General: No focal deficit present.     Mental Status: She is alert.     Sensory: No sensory deficit.     Motor: No weakness.     ED Results / Procedures / Treatments   Labs (all labs ordered are listed, but only abnormal  results are displayed) Labs Reviewed  RESP PANEL BY RT-PCR (RSV, FLU A&B, COVID)  RVPGX2 - Abnormal; Notable for the following components:      Result Value   Influenza A by PCR POSITIVE (*)    All other components within normal limits    EKG EKG Interpretation Date/Time:  Monday October 09 2023 17:03:44 EST Ventricular Rate:  82 PR Interval:  140 QRS Duration:  76 QT Interval:  376 QTC Calculation: 439 R Axis:   92  Text Interpretation: Normal sinus rhythm Rightward axis Borderline ECG When compared with ECG of 14-Jul-2021 11:04, No significant change was found Confirmed by Eber Hong ([id number]) on 10/09/2023 5:33:48 PM  Radiology No results found.  Procedures Procedures    Medications Ordered in ED Medications  albuterol (VENTOLIN HFA) 108 (90 Base) MCG/ACT inhaler 2 puff (has no administration in time range)    ED Course/ Medical Decision Making/ A&P                                 Medical Decision Making Patient here with 3-day history of persistent cough.  Describes cough is productive.  She endorses some shortness of breath but states this is only associated with excessive coughing.  No chest pain or abdominal pain no nausea vomiting or hemoptysis.   On my exam, patient actively coughing during exam.  There is no increased work of breathing.  I do not appreciate any rales on my exam.  Vital signs show patient is hypertensive with mild fever.  No tachycardia or tachypnea.  O2 sat in the lower to mid 90s but patient is a smoker  COPD, pneumonia, viral process all considered.  PE also considered but felt less likely.  Amount and/or Complexity of Data Reviewed Labs: ordered.    Details: Flu a positive Radiology: ordered.    Details: Chest x-ray without evidence of pneumonia Discussion of management or test interpretation with external provider(s): Discussed findings with patient.  Cough likely secondary to positive flu.  She is nontoxic-appearing.  Agreeable to  symptomatic treatment.  She will follow-up closely outpatient with PCP.  Prescription for Tessalon written and albuterol MDI dispensed.  Risk OTC drugs. Prescription drug management.           Final Clinical Impression(s) / ED Diagnoses Final diagnoses:  Influenza-like illness    Rx / DC Orders ED Discharge Orders          Ordered    benzonatate (TESSALON) 100 MG capsule  Every 8 hours        10/09/23 1812  Pauline Aus, PA-C 10/09/23 1819    Eber Hong, MD 10/09/23 [phone]

## 2023-10-09 NOTE — ED Provider Triage Note (Cosign Needed Addendum)
Emergency Medicine Provider Triage Evaluation Note  LALISA KIEHN , a 68 y.o. female  was evaluated in triage.  Pt complains of cough x 3 days.  Cough has been persistent and nonproductive.  She has been using albuterol inhaler without relief.  No reported fever at home.  She denies any vomiting.  Some shortness of breath stated with persistent cough but denies any chest pain. Does not use supplemental oxygen at baseline.   Patient very hard of hearing  Review of Systems  Positive: Cough Negative: Chest pain, vomiting, fever  Physical Exam  BP (!) 149/117 (BP Location: Right Arm)   Pulse 86   Temp (!) 100.5 F (38.1 C) (Oral)   Resp 18   Ht 5\' 4"  (1.626 m)   Wt 96 kg   SpO2 92%   BMI 36.33 kg/m  Gen:   Awake, no distress   Resp:  Normal effort  MSK:   Moves extremities without difficulty  Other:  No peripheral edema  Medical Decision Making  Medically screening exam initiated at 5:42 PM.  Appropriate orders placed.  TEZRA MAHR was informed that the remainder of the evaluation will be completed by another provider, this initial triage assessment does not replace that evaluation, and the importance of remaining in the ED until their evaluation is complete.     Pauline Aus, PA-C 10/09/23 1745    Pauline Aus, PA-C 10/09/23 1746

## 2023-10-09 NOTE — ED Triage Notes (Signed)
Pt arrived via POV c/o cough X 3 days. OTC medications have not helped.

## 2023-10-09 NOTE — Discharge Instructions (Addendum)
Albuterol is an inhaled medication which can help you to breathe better, you should take 2 puffs every 4 hours as needed, this may cause your heart to feel like it is racing, this should be a temporary side effect.  Tessalon is a cough medication that helps reduce the amount of coughing that you are having.  You may take up to 200 mg every 8 hours as needed.  This can be used safely with most or other over-the-counter medications but talk to your pharmacist before taking anything else over-the-counter with it  Make sure you are taking Tylenol or ibuprofen for fever  Thank you for allowing [place] to treat you in the emergency department today.  After reviewing your examination and potential testing that was done it appears that you are safe to go home.  I would like for you to follow-up with your doctor within the next several days, have them obtain your records and follow-up with them to review all potential tests and results from your visit.  If you should develop severe or worsening symptoms return to the emergency department immediately

## 2023-10-09 NOTE — ED Provider Notes (Signed)
68 year old female chronic smoker lives at home with family, has had several days of coughing and presents today with low-grade fever of 100.5, normal heart rate, oxygen of 92 to 95% with mild expiratory wheezing.  She coughs frequently, she is flu positive, x-ray negative for pneumonia, given Tylenol, albuterol, cough medications for home, the family member states that there is some weakness of the legs but she is able to fully lift both of her legs for me.  She does need some assistance getting in and out of the car which the family members offering.  The family member understand she will need some help for the next few days.  The patient does not appear septic, stable for discharge   Eber Hong, MD 10/09/23 [phone]

## 2023-10-13 ENCOUNTER — Ambulatory Visit (HOSPITAL_COMMUNITY): Payer: 59

## 2023-10-13 NOTE — Telephone Encounter (Signed)
 Pt left vm stating she had an US  scheduled this morning but needed to reschedule due to having the flu..  Attempted to call pt, numerous rings, no vm or am. Unable to leave message

## 2023-10-16 ENCOUNTER — Emergency Department (HOSPITAL_COMMUNITY): Payer: 59

## 2023-10-16 ENCOUNTER — Ambulatory Visit
Admission: EM | Admit: 2023-10-16 | Discharge: 2023-10-16 | Disposition: A | Discharge status: Other Acute Inpatient Hospital | Payer: 59

## 2023-10-16 ENCOUNTER — Inpatient Hospital Stay (HOSPITAL_COMMUNITY)
Admission: EM | Admit: 2023-10-16 | Discharge: 2023-10-23 | DRG: 190 | Disposition: A | Discharge status: Home Health | Payer: 59 | Source: Ambulatory Visit | Attending: Family Medicine | Admitting: Family Medicine

## 2023-10-16 DIAGNOSIS — J440 Chronic obstructive pulmonary disease with acute lower respiratory infection: Secondary | ICD-10-CM | POA: Diagnosis present

## 2023-10-16 DIAGNOSIS — H3530 Unspecified macular degeneration: Secondary | ICD-10-CM | POA: Diagnosis present

## 2023-10-16 DIAGNOSIS — R0602 Shortness of breath: Secondary | ICD-10-CM | POA: Diagnosis not present

## 2023-10-16 DIAGNOSIS — K7460 Unspecified cirrhosis of liver: Secondary | ICD-10-CM | POA: Diagnosis present

## 2023-10-16 DIAGNOSIS — Z90710 Acquired absence of both cervix and uterus: Secondary | ICD-10-CM

## 2023-10-16 DIAGNOSIS — Z800 Family history of malignant neoplasm of digestive organs: Secondary | ICD-10-CM

## 2023-10-16 DIAGNOSIS — J441 Chronic obstructive pulmonary disease with (acute) exacerbation: Principal | ICD-10-CM | POA: Diagnosis present

## 2023-10-16 DIAGNOSIS — H9193 Unspecified hearing loss, bilateral: Secondary | ICD-10-CM

## 2023-10-16 DIAGNOSIS — Z9884 Bariatric surgery status: Secondary | ICD-10-CM

## 2023-10-16 DIAGNOSIS — J9601 Acute respiratory failure with hypoxia: Secondary | ICD-10-CM | POA: Diagnosis not present

## 2023-10-16 DIAGNOSIS — Z8711 Personal history of peptic ulcer disease: Secondary | ICD-10-CM

## 2023-10-16 DIAGNOSIS — Z79899 Other long term (current) drug therapy: Secondary | ICD-10-CM

## 2023-10-16 DIAGNOSIS — Z833 Family history of diabetes mellitus: Secondary | ICD-10-CM

## 2023-10-16 DIAGNOSIS — Z6836 Body mass index (BMI) 36.0-36.9, adult: Secondary | ICD-10-CM

## 2023-10-16 DIAGNOSIS — J129 Viral pneumonia, unspecified: Secondary | ICD-10-CM | POA: Diagnosis present

## 2023-10-16 DIAGNOSIS — R001 Bradycardia, unspecified: Secondary | ICD-10-CM | POA: Diagnosis not present

## 2023-10-16 DIAGNOSIS — F17210 Nicotine dependence, cigarettes, uncomplicated: Secondary | ICD-10-CM | POA: Diagnosis present

## 2023-10-16 DIAGNOSIS — Z881 Allergy status to other antibiotic agents status: Secondary | ICD-10-CM

## 2023-10-16 DIAGNOSIS — Z860100 Personal history of colon polyps, unspecified: Secondary | ICD-10-CM

## 2023-10-16 DIAGNOSIS — H9190 Unspecified hearing loss, unspecified ear: Secondary | ICD-10-CM | POA: Diagnosis present

## 2023-10-16 DIAGNOSIS — R0600 Dyspnea, unspecified: Secondary | ICD-10-CM

## 2023-10-16 DIAGNOSIS — K219 Gastro-esophageal reflux disease without esophagitis: Secondary | ICD-10-CM | POA: Diagnosis present

## 2023-10-16 DIAGNOSIS — F32A Depression, unspecified: Secondary | ICD-10-CM | POA: Diagnosis present

## 2023-10-16 DIAGNOSIS — G4733 Obstructive sleep apnea (adult) (pediatric): Secondary | ICD-10-CM

## 2023-10-16 DIAGNOSIS — Z66 Do not resuscitate: Secondary | ICD-10-CM | POA: Diagnosis present

## 2023-10-16 DIAGNOSIS — Z9049 Acquired absence of other specified parts of digestive tract: Secondary | ICD-10-CM

## 2023-10-16 DIAGNOSIS — E66812 Obesity, class 2: Secondary | ICD-10-CM | POA: Diagnosis present

## 2023-10-16 DIAGNOSIS — F259 Schizoaffective disorder, unspecified: Secondary | ICD-10-CM | POA: Diagnosis present

## 2023-10-16 DIAGNOSIS — J1008 Influenza due to other identified influenza virus with other specified pneumonia: Secondary | ICD-10-CM | POA: Diagnosis present

## 2023-10-16 LAB — CBC WITH DIFFERENTIAL/PLATELET
Abs Immature Granulocytes: 0.05 10*3/uL (ref 0.00–0.07)
Basophils Absolute: 0 10*3/uL (ref 0.0–0.1)
Basophils Relative: 0 %
Eosinophils Absolute: 0 10*3/uL (ref 0.0–0.5)
Eosinophils Relative: 0 %
HCT: 39.3 % (ref 36.0–46.0)
Hemoglobin: 13.1 g/dL (ref 12.0–15.0)
Immature Granulocytes: 1 %
Lymphocytes Relative: 7 %
Lymphs Abs: 0.7 10*3/uL (ref 0.7–4.0)
MCH: 30.1 pg (ref 26.0–34.0)
MCHC: 33.3 g/dL (ref 30.0–36.0)
MCV: 90.3 fL (ref 80.0–100.0)
Monocytes Absolute: 0.4 10*3/uL (ref 0.1–1.0)
Monocytes Relative: 4 %
Neutro Abs: 9 10*3/uL — ABNORMAL HIGH (ref 1.7–7.7)
Neutrophils Relative %: 88 %
Platelets: 305 10*3/uL (ref 150–400)
RBC: 4.35 MIL/uL (ref 3.87–5.11)
RDW: 14.2 % (ref 11.5–15.5)
WBC: 10.1 10*3/uL (ref 4.0–10.5)
nRBC: 0 % (ref 0.0–0.2)

## 2023-10-16 LAB — URINALYSIS, W/ REFLEX TO CULTURE (INFECTION SUSPECTED)
Bilirubin Urine: NEGATIVE
Glucose, UA: NEGATIVE mg/dL
Hgb urine dipstick: NEGATIVE
Ketones, ur: 5 mg/dL — AB
Leukocytes,Ua: NEGATIVE
Nitrite: NEGATIVE
Protein, ur: 100 mg/dL — AB
Specific Gravity, Urine: 1.023 (ref 1.005–1.030)
pH: 5 (ref 5.0–8.0)

## 2023-10-16 LAB — COMPREHENSIVE METABOLIC PANEL
ALT: 17 U/L (ref 0–44)
AST: 29 U/L (ref 15–41)
Albumin: 3.1 g/dL — ABNORMAL LOW (ref 3.5–5.0)
Alkaline Phosphatase: 66 U/L (ref 38–126)
Anion gap: 9 (ref 5–15)
BUN: 13 mg/dL (ref 8–23)
CO2: 25 mmol/L (ref 22–32)
Calcium: 8.7 mg/dL — ABNORMAL LOW (ref 8.9–10.3)
Chloride: 104 mmol/L (ref 98–111)
Creatinine, Ser: 0.92 mg/dL (ref 0.44–1.00)
GFR, Estimated: >60 mL/min (ref >60)
Glucose, Bld: 116 mg/dL — ABNORMAL HIGH (ref 70–99)
Potassium: 3.5 mmol/L (ref 3.5–5.1)
Sodium: 138 mmol/L (ref 135–145)
Total Bilirubin: 0.8 mg/dL (ref 0.0–1.2)
Total Protein: 7 g/dL (ref 6.5–8.1)

## 2023-10-16 LAB — LACTIC ACID, PLASMA: Lactic Acid, Venous: 0.8 mmol/L (ref 0.5–1.9)

## 2023-10-16 LAB — APTT: aPTT: 30 s (ref 24–36)

## 2023-10-16 LAB — PROTIME-INR
INR: 1 (ref 0.8–1.2)
Prothrombin Time: 13.8 s (ref 11.4–15.2)

## 2023-10-16 LAB — PROCALCITONIN: Procalcitonin: <0.1 ng/mL

## 2023-10-16 LAB — BRAIN NATRIURETIC PEPTIDE: B Natriuretic Peptide: 170 pg/mL — ABNORMAL HIGH (ref 0.0–100.0)

## 2023-10-16 MED ORDER — DOXYCYCLINE HYCLATE 100 MG PO TABS
100.0000 mg | ORAL_TABLET | Freq: Once | ORAL | Status: AC
  Administered 2023-10-16: 100 mg via ORAL
  Filled 2023-10-16: qty 1

## 2023-10-16 MED ORDER — DM-GUAIFENESIN ER 30-600 MG PO TB12
1.0000 | ORAL_TABLET | Freq: Two times a day (BID) | ORAL | Status: DC
  Administered 2023-10-16 – 2023-10-19 (×6): 1 via ORAL
  Filled 2023-10-16 (×6): qty 1

## 2023-10-16 MED ORDER — ACETAMINOPHEN 650 MG RE SUPP: 650.0000 mg | Freq: Four times a day (QID) | RECTAL | Status: DC | PRN

## 2023-10-16 MED ORDER — IPRATROPIUM-ALBUTEROL 0.5-2.5 (3) MG/3ML IN SOLN: 3.0000 mL | Freq: Four times a day (QID) | RESPIRATORY_TRACT | Status: DC | PRN

## 2023-10-16 MED ORDER — ACETAMINOPHEN 325 MG PO TABS
650.0000 mg | ORAL_TABLET | Freq: Four times a day (QID) | ORAL | Status: DC | PRN
  Filled 2023-10-16: qty 2

## 2023-10-16 MED ORDER — ENOXAPARIN SODIUM 40 MG/0.4ML IJ SOSY
40.0000 mg | PREFILLED_SYRINGE | INTRAMUSCULAR | Status: DC
  Administered 2023-10-16 – 2023-10-22 (×7): 40 mg via SUBCUTANEOUS
  Filled 2023-10-16 (×7): qty 0.4

## 2023-10-16 MED ORDER — ONDANSETRON HCL 4 MG PO TABS: 4.0000 mg | ORAL_TABLET | Freq: Four times a day (QID) | ORAL | Status: DC | PRN

## 2023-10-16 MED ORDER — TRAZODONE HCL 50 MG PO TABS
300.0000 mg | ORAL_TABLET | Freq: Every day | ORAL | Status: DC
  Administered 2023-10-16 – 2023-10-22 (×7): 300 mg via ORAL
  Filled 2023-10-16 (×7): qty 6

## 2023-10-16 MED ORDER — FLUOXETINE HCL 20 MG PO CAPS
40.0000 mg | ORAL_CAPSULE | Freq: Every day | ORAL | Status: DC
  Administered 2023-10-16 – 2023-10-23 (×8): 40 mg via ORAL
  Filled 2023-10-16 (×8): qty 2

## 2023-10-16 MED ORDER — DOXYCYCLINE HYCLATE 100 MG PO TABS: 100.0000 mg | ORAL_TABLET | Freq: Every day | ORAL | Status: DC

## 2023-10-16 MED ORDER — RISPERIDONE 1 MG PO TABS
3.0000 mg | ORAL_TABLET | Freq: Every day | ORAL | Status: DC
  Administered 2023-10-16 – 2023-10-22 (×7): 3 mg via ORAL
  Filled 2023-10-16 (×7): qty 3

## 2023-10-16 MED ORDER — SODIUM CHLORIDE 0.9 % IV SOLN
1.0000 g | Freq: Once | INTRAVENOUS | Status: AC
  Administered 2023-10-16: 1 g via INTRAVENOUS
  Filled 2023-10-16: qty 10

## 2023-10-16 MED ORDER — DOXYCYCLINE HYCLATE 100 MG PO TABS
100.0000 mg | ORAL_TABLET | Freq: Two times a day (BID) | ORAL | Status: DC
  Administered 2023-10-16 – 2023-10-17 (×2): 100 mg via ORAL
  Filled 2023-10-16 (×2): qty 1

## 2023-10-16 MED ORDER — METHYLPREDNISOLONE SODIUM SUCC 125 MG IJ SOLR: 125.0000 mg | Freq: Once | INTRAMUSCULAR | Status: DC

## 2023-10-16 MED ORDER — SODIUM CHLORIDE 0.9 % IV SOLN: 1.0000 g | INTRAVENOUS | Status: DC

## 2023-10-16 MED ORDER — ONDANSETRON HCL 4 MG/2ML IJ SOLN
4.0000 mg | Freq: Four times a day (QID) | INTRAMUSCULAR | Status: DC | PRN
  Administered 2023-10-17: 4 mg via INTRAVENOUS
  Filled 2023-10-16: qty 2

## 2023-10-16 MED ORDER — ALBUTEROL SULFATE (2.5 MG/3ML) 0.083% IN NEBU
10.0000 mg/h | INHALATION_SOLUTION | Freq: Once | RESPIRATORY_TRACT | Status: AC
  Administered 2023-10-16: 10 mg/h via RESPIRATORY_TRACT
  Filled 2023-10-16: qty 12

## 2023-10-16 MED ORDER — IPRATROPIUM BROMIDE 0.02 % IN SOLN
0.5000 mg | Freq: Once | RESPIRATORY_TRACT | Status: AC
  Administered 2023-10-16: 0.5 mg via RESPIRATORY_TRACT
  Filled 2023-10-16: qty 2.5

## 2023-10-16 MED ORDER — PANTOPRAZOLE SODIUM 40 MG PO TBEC
40.0000 mg | DELAYED_RELEASE_TABLET | Freq: Every day | ORAL | Status: DC
  Administered 2023-10-16 – 2023-10-23 (×8): 40 mg via ORAL
  Filled 2023-10-16 (×8): qty 1

## 2023-10-16 MED ORDER — METHOCARBAMOL 500 MG PO TABS
500.0000 mg | ORAL_TABLET | Freq: Four times a day (QID) | ORAL | Status: DC
  Administered 2023-10-16 – 2023-10-23 (×27): 500 mg via ORAL
  Filled 2023-10-16 (×27): qty 1

## 2023-10-16 NOTE — ED Triage Notes (Signed)
 Pt c/o ongoing URI symptoms for the last two weeks, diagnosed with the flu at that time. Pt states that she went to UC for continued non-productive cough and they found her to be 89% on RA. Pt does not wear oxygen at baseline. EMS administered 2.5 mg albuterol  and 125 mg SoluMedrol IV enroute.

## 2023-10-16 NOTE — ED Notes (Signed)
 Patient is being discharged from the Urgent Care and sent to the Emergency Department via EMS . Per provider Crystal K., patient is in need of higher level of care due to SOB and low oxygen saturation needing further evaluation. Patient is aware and verbalizes understanding of plan of care.  Vitals:   10/16/23 1036  BP: 101/72  Pulse: 76  Resp: 14  Temp: 97.9 F (36.6 C)  SpO2: 91%

## 2023-10-16 NOTE — Discharge Instructions (Signed)
 You have been discharged to the emergency department.  Due to oxygen dependence 3 to 4 L to maintain sats greater than 92%.

## 2023-10-16 NOTE — ED Provider Notes (Signed)
 [facility]    CSN: [id number] Arrival date & time: 10/16/23  0915      History   Chief Complaint No chief complaint on file.   HPI [name] is a 68 y.o. female.   HPI  She is seen today with her sister for evaluation of flulike symptoms.  She reports that she was seen and diagnosed with flu on Friday in the emergency department and sent home with benzonatate .  However medical records indicates she was seen in the emergency department on February 3 tested positive for flu.  She is a smoker.  She denies history of COPD.  She was provided with an albuterol  inhaler.  She endorses being exposed to some family members that are ill.  She lives with her sister who is oxygen dependent.  Past Medical History:  Diagnosis Date   Chronic constipation    Cirrhosis (HCC)    child pugh class A and MELD 5, completed hep A and B vaccine series, no HCC on 3/16 MRI   Colonic inertia    Depression    Diverticula of colon    Diverticulosis 01/31/2008   colonoscopy Dr Riley Cheadle   GERD (gastroesophageal reflux disease)    Hyperplastic colon polyp    Liver mass    Macular degeneration    Obesity    PUD (peptic ulcer disease) 1988   Schizoaffective disorder    Mental Health-Dr Dekle   Seizures (HCC)    as child; no medsd and has had no seizures since then.   Wrist fracture    Left wrist s/p fall and surgical repair    Patient Active Problem List   Diagnosis Date Noted   Lung nodule 12/29/2020   Diarrhea 09/17/2020   Encounter for screening colonoscopy 09/20/2017   GERD (gastroesophageal reflux disease) 09/15/2016   Mucosal abnormality of stomach    Nausea with vomiting 11/04/2015   Abdominal pain, epigastric 09/17/2015   Peritonitis (HCC) 08/27/2015   Pain following surgery or procedure 08/22/2015   Abdominal pain 08/01/2015   Nausea 08/01/2015   Abdominal mass 08/01/2015   Schizoaffective disorder (HCC) 05/15/2014   Hepatic cirrhosis (HCC) 10/18/2013   LLQ pain  08/13/2013   DEPRESSION 04/08/2008   PUD 04/08/2008   DIVERTICULOSIS, COLON 04/08/2008   Constipation 04/08/2008   MORBID OBESITY, HX OF 04/08/2008    Past Surgical History:  Procedure Laterality Date   ABDOMINAL HYSTERECTOMY     CHOLECYSTECTOMY  08/2010   cholelithiasis; Dr Larrie Po   COLONOSCOPY  01/31/2008   Dr. Riley Cheadle- normal rectum, L sided diverticula, long tortuous colon. hyperplasitic  polyp.   COLONOSCOPY WITH PROPOFOL  N/A 10/19/2017    single 4 mm polyp in the sigmoid colon which was sessile, scattered small mouth diverticula in the sigmoid and descending colon, otherwise normal.  Surgical pathology found the polyp to be hyperplastic.  Recommended 10-year repeat colonoscopy.   ERCP W/ SPHINCTEROTOMY AND BALLOON DILATION  08/05/2010   normal ampulla/mild erythema in the gastric remnant without ulcerations or erosions. normal retroflexed view of the cardia/anastomosis normal/distal common bile duct stones s/p extraction and shincterotomy   ESOPHAGOGASTRODUODENOSCOPY N/A 10/24/2013   WUJ:WJXBJY esophagus. Surgically altered stomach. Abnormal gastric mucosa  -  status post biopsy (reactive gastrophathy)   ESOPHAGOGASTRODUODENOSCOPY (EGD) WITH PROPOFOL  N/A 11/30/2015   LA grade A esophagitis, no varices, surveillance in 2019   ESOPHAGOGASTRODUODENOSCOPY (EGD) WITH PROPOFOL  N/A 10/19/2017   erosive reflux esophagitis status post dilation, surgically altered stomach status post gastric bypass with a single patent  apparent small bowel limb, small hiatal hernia.   ESOPHAGOGASTRODUODENOSCOPY (EGD) WITH PROPOFOL  N/A 07/16/2021   normal esophagus, no varices, portal gastropathy. 3 year screening   EUS N/A 08/20/2015   Dr. Howard Macho: well-circumscribed 5.2 cm chronic mass in porta hepatis, internally mass is gelatinous. Small distal esophageal varices (limited view), Bilroth 1 type anatomy, cytology NEGATIVE for malignant cells. Suspicion for neoplasm low    FOOT SURGERY     left   GASTRIC  BYPASS     with Revision, Dr. Gradie Lawless DILATION N/A 10/19/2017   Procedure: Londa Rival DILATION;  Surgeon: Suzette Espy, MD;  Location: [facility];  Service: Endoscopy;  Laterality: N/A;   POLYPECTOMY  10/19/2017   Procedure: POLYPECTOMY;  Surgeon: Suzette Espy, MD;  Location: [facility];  Service: Endoscopy;;  sigmoid colon (CS)    REDUCTION MAMMAPLASTY Bilateral    S/P Hysterectomy     partial   TONSILLECTOMY     WRIST SURGERY Left 05/2016    OB History   No obstetric history on file.      Home Medications    Prior to Admission medications   Medication Sig Start Date End Date Taking? Authorizing Provider  albuterol  (VENTOLIN  HFA) 108 (90 Base) MCG/ACT inhaler Inhale 1-2 puffs into the lungs every 6 (six) hours as needed for shortness of breath or wheezing. 10/09/23  Yes Early Glisson, MD  benzonatate  (TESSALON ) 100 MG capsule Take 1 capsule (100 mg total) by mouth every 8 (eight) hours. 10/09/23  Yes Early Glisson, MD  FLUoxetine  (PROZAC ) 40 MG capsule Take 40 mg by mouth in the morning. 06/06/12  Yes [provider]  linaclotide  (LINZESS ) 290 MCG CAPS capsule Take 1 capsule (290 mcg total) by mouth daily before breakfast. 06/15/23  Yes Delman Ferns, NP  methocarbamol  (ROBAXIN ) 500 MG tablet Take 500 mg by mouth 4 (four) times daily.   Yes [provider]  ondansetron  (ZOFRAN ) 4 MG tablet Take 1 tablet (4 mg total) by mouth every 8 (eight) hours as needed for nausea or vomiting. 06/15/23  Yes Delman Ferns, NP  pantoprazole  (PROTONIX ) 40 MG tablet TAKE 1 TABLET BY MOUTH DAILY 06/12/23  Yes Delman Ferns, NP  risperiDONE  (RISPERDAL ) 3 MG tablet Take 3 mg by mouth at bedtime.    Yes [provider]  traZODone  (DESYREL ) 150 MG tablet Take 300 mg by mouth at bedtime. 07/20/15  Yes [provider]    Family History Family History  Problem Relation Age of Onset   Diabetes Father    Colon cancer Maternal Uncle    Liver cancer  Maternal Uncle        16 years old   Liver disease Neg Hx     Social History Social History   Tobacco Use   Smoking status: Every Day    Current packs/day: 1.00    Average packs/day: 1 pack/day for 36.0 years (36.0 ttl pk-yrs)    Types: Cigarettes   Smokeless tobacco: Never  Vaping Use   Vaping status: Never Used  Substance Use Topics   Alcohol use: No    Alcohol/week: 0.0 standard drinks of alcohol   Drug use: Yes    Types: Marijuana    Comment: 3 joints/day     Allergies   Ciprofloxacin and Kiwi extract   Review of Systems Review of Systems   Physical Exam Triage Vital Signs ED Triage Vitals [10/16/23 1036]  Encounter Vitals Group     BP 101/72  Systolic BP Percentile      Diastolic BP Percentile      Pulse Rate 76     Resp 14     Temp 97.9 F (36.6 C)     Temp Source Oral     SpO2 91 %     Weight      Height      Head Circumference      Peak Flow      Pain Score      Pain Loc      Pain Education      Exclude from Growth Chart    No data found.  Updated Vital Signs BP 101/72 (BP Location: Left Arm)   Pulse 76   Temp 97.9 F (36.6 C) (Oral)   Resp 14   SpO2 91%   Visual Acuity Right Eye Distance:   Left Eye Distance:   Bilateral Distance:    Right Eye Near:   Left Eye Near:    Bilateral Near:     Physical Exam Constitutional:      Appearance: She is obese.  HENT:     Head: Normocephalic and atraumatic.     Nose: Nose normal.  Cardiovascular:     Rate and Rhythm: Normal rate and regular rhythm.     Pulses: Normal pulses.  Pulmonary:     Effort: Respiratory distress present.     Breath sounds: No stridor. Wheezing, rhonchi and rales present.  Chest:     Chest wall: No tenderness.  Musculoskeletal:        General: Normal range of motion.     Cervical back: Normal range of motion.  Skin:    Capillary Refill: Capillary refill takes less than 2 seconds.     Coloration: Skin is pale.  Neurological:     General: No focal  deficit present.     Mental Status: She is alert and oriented to person, place, and time.      UC Treatments / Results  Labs (all labs ordered are listed, but only abnormal results are displayed) Labs Reviewed - No data to display  EKG   Radiology No results found.  Procedures Procedures (including critical care time)  Medications Ordered in UC Medications - No data to display  Initial Impression / Assessment and Plan / UC Course  I have reviewed the triage vital signs and the nursing notes.  Pertinent labs & imaging results that were available during my care of the patient were reviewed by me and considered in my medical decision making (see chart for details).     Flulike symptoms Final Clinical Impressions(s) / UC Diagnoses   Final diagnoses:  Dyspnea, unspecified type     Discharge Instructions      You have been discharged to the emergency department.  Due to oxygen dependence 3 to 4 L to maintain sats greater than 92%.    EMS call to take patient to emergency department. ED Prescriptions   None    PDMP not reviewed this encounter.   Eleanore Grey [place], [place] 10/16/23 1053

## 2023-10-16 NOTE — ED Notes (Signed)
 Called charge RN, Gave report to Kiel at [facility] Emergency department.

## 2023-10-16 NOTE — ED Provider Notes (Signed)
 [place] EMERGENCY DEPARTMENT AT [facility] Provider Note   CSN: [id number] Arrival date & time: 10/16/23  1111     History  Chief Complaint  Patient presents with   Shortness of Breath    [name] is a 68 y.o. female.  HPI     68 year old female comes in with chief complaint of shortness of breath.  Patient has history of liver disease history and smokes about a pack a day.  She denies any confirmed diagnosis of COPD, but suspects that she likely has 1.  She was diagnosed with flu on 2-3.  Patient states that she has felt rough for the last 2 weeks.  She is primarily having weakness, shortness of breath.  She is having subjective fevers, chills.  She had gone to the urgent care today and was sent to the emergency room because of low oxygen state.  Patient typically not on oxygen.  She has been using her rescue inhaler at home with minimal relief.  Patient indicates that her cough and shortness of breath has worsened.  Cough is producing green phlegm.  She is no longer having fevers.  Home Medications Prior to Admission medications   Medication Sig Start Date End Date Taking? Authorizing Provider  albuterol  (VENTOLIN  HFA) 108 (90 Base) MCG/ACT inhaler Inhale 1-2 puffs into the lungs every 6 (six) hours as needed for shortness of breath or wheezing. 10/09/23  Yes Early Glisson, MD  FLUoxetine  (PROZAC ) 40 MG capsule Take 40 mg by mouth in the morning. 06/06/12  Yes [provider]  methocarbamol  (ROBAXIN ) 500 MG tablet Take 500 mg by mouth 4 (four) times daily.   Yes [provider]  ondansetron  (ZOFRAN ) 4 MG tablet Take 1 tablet (4 mg total) by mouth every 8 (eight) hours as needed for nausea or vomiting. 06/15/23  Yes Delman Ferns, NP  pantoprazole  (PROTONIX ) 40 MG tablet TAKE 1 TABLET BY MOUTH DAILY 06/12/23  Yes Delman Ferns, NP  risperiDONE  (RISPERDAL ) 3 MG tablet Take 3 mg by mouth at bedtime.    Yes [provider]  traZODone   (DESYREL ) 150 MG tablet Take 300 mg by mouth at bedtime. 07/20/15  Yes [provider]      Allergies    Ciprofloxacin and Kiwi extract    Review of Systems   Review of Systems  All other systems reviewed and are negative.   Physical Exam Updated Vital Signs BP (!) 110/57   Pulse 79   Temp 98.1 F (36.7 C) (Oral)   Resp (!) 21   Ht 5\' 4"  (1.626 m)   Wt 95.7 kg   SpO2 93%   BMI 36.22 kg/m  Physical Exam Vitals and nursing note reviewed.  Constitutional:      Appearance: She is well-developed.  HENT:     Head: Atraumatic.  Neck:     Vascular: No JVD.  Cardiovascular:     Rate and Rhythm: Normal rate.  Pulmonary:     Effort: Pulmonary effort is normal. No tachypnea.     Breath sounds: Examination of the right-upper field reveals wheezing. Examination of the left-upper field reveals wheezing. Examination of the right-middle field reveals wheezing. Examination of the left-middle field reveals wheezing. Examination of the right-lower field reveals wheezing. Examination of the left-lower field reveals wheezing. Decreased breath sounds and wheezing present.     Comments: Tachypnea noted with a respiratory rate over 20, no respiratory distress, O2 sats 90% on 2 L of oxygen Musculoskeletal:  Cervical back: Normal range of motion and neck supple.     Right lower leg: No edema.     Left lower leg: No edema.  Skin:    General: Skin is warm and dry.  Neurological:     Mental Status: She is alert and oriented to person, place, and time.     ED Results / Procedures / Treatments   Labs (all labs ordered are listed, but only abnormal results are displayed) Labs Reviewed  COMPREHENSIVE METABOLIC PANEL - Abnormal; Notable for the following components:      Result Value   Glucose, Bld 116 (*)    Calcium 8.7 (*)    Albumin 3.1 (*)    All other components within normal limits  CBC WITH DIFFERENTIAL/PLATELET - Abnormal; Notable for the following components:   Neutro Abs  9.0 (*)    All other components within normal limits  URINALYSIS, W/ REFLEX TO CULTURE (INFECTION SUSPECTED) - Abnormal; Notable for the following components:   Color, Urine AMBER (*)    APPearance HAZY (*)    Ketones, ur 5 (*)    Protein, ur 100 (*)    Bacteria, UA RARE (*)    All other components within normal limits  BRAIN NATRIURETIC PEPTIDE - Abnormal; Notable for the following components:   B Natriuretic Peptide 170.0 (*)    All other components within normal limits  CULTURE, BLOOD (SINGLE)  LACTIC ACID, PLASMA  PROTIME-INR  APTT  LACTIC ACID, PLASMA    EKG EKG Interpretation Date/Time:  Monday October 16 2023 11:24:52 EST Ventricular Rate:  65 PR Interval:  144 QRS Duration:  89 QT Interval:  448 QTC Calculation: 466 R Axis:   75  Text Interpretation: Sinus rhythm Borderline T abnormalities, anterior leads No acute changes Confirmed by Deatra Face [phone]) on 10/16/2023 2:44:29 PM  Radiology No results found.  Procedures .Critical Care  Performed by: Deatra Face, MD Authorized by: Deatra Face, MD   Critical care provider statement:    Critical care time (minutes):  45   Critical care was necessary to treat or prevent imminent or life-threatening deterioration of the following conditions:  Respiratory failure   Critical care was time spent personally by me on the following activities:  Development of treatment plan with patient or surrogate, discussions with consultants, evaluation of patient's response to treatment, examination of patient, ordering and review of laboratory studies, ordering and review of radiographic studies, ordering and performing treatments and interventions, pulse oximetry, re-evaluation of patient's condition, review of old charts and obtaining history from patient or surrogate     Medications Ordered in ED Medications  cefTRIAXone  (ROCEPHIN ) 1 g in sodium chloride  0.9 % 100 mL IVPB (has no administration in time range)   albuterol  (PROVENTIL ) (2.5 MG/3ML) 0.083% nebulizer solution (10 mg/hr Nebulization Given 10/16/23 1303)  ipratropium (ATROVENT ) nebulizer solution 0.5 mg (0.5 mg Nebulization Given 10/16/23 1303)  doxycycline  (VIBRA -TABS) tablet 100 mg (100 mg Oral Given 10/16/23 1445)    ED Course/ Medical Decision Making/ A&P                                 Medical Decision Making Amount and/or Complexity of Data Reviewed Labs: ordered. Radiology: ordered.  Risk Prescription drug management. Decision regarding hospitalization.   This patient presents to the ED with chief complaint(s) of generalized weakness, shortness of breath, persistent cough with pertinent past medical history of liver cirrhosis, tobacco use disorder.The  complaint involves an extensive differential diagnosis and also carries with it a high risk of complications and morbidity.    The differential diagnosis includes : COPD exacerbation, fluid related complication, superimposed bacterial pneumonia, pleural effusion, congestive heart failure, severe electrolyte abnormality, renal failure.  The initial plan is to get basic labs, repeat chest x-ray. We will try to rule out CHF.   Additional history obtained: Additional history obtained from EMS  Records reviewed  urgent care notes and the recent ER visit notes.  Independent labs interpretation:  The following labs were independently interpreted: CBC, BMP are both reassuring.  Independent visualization and interpretation of imaging: - I independently visualized the following imaging with scope of interpretation limited to determining acute life threatening conditions related to emergency care: X-ray of the chest, which revealed increased opacity on the left side.  The x-ray looks similar to the x-ray patient had few days back.  Questioning if she has superimposed pneumonia in the left lower lung field.  Treatment and Reassessment: Patient given continuous nebulizer treatment.   She was reassessed thereafter.  She feels slightly better, but her O2 sats are 91% on 2 L and she continues to have respiratory rate of 22-25.  Workup is overall reassuring.  Patient not sure if she can go home feeling this way. We will request observation admission for what seems like primarily COPD exacerbation.  We will give her pneumonia coverage given her new worsening cough, green phlegm.   Final Clinical Impression(s) / ED Diagnoses Final diagnoses:  Acute hypoxemic respiratory failure (HCC)  COPD exacerbation (HCC)    Rx / DC Orders ED Discharge Orders     None         Deatra Face, MD 10/16/23 1446

## 2023-10-16 NOTE — H&P (Signed)
 History and Physical    [name] LKG:[id number] DOB: 07/02/1956 DOA: 10/16/2023  PCP: Jace Martinet, FNP   Patient coming from: Home  Chief Complaint: Worsening dyspnea  HPI: [name] is a 68 y.o. female with medical history significant for COPD without chronic hypoxemia, GERD, and schizoaffective disorder who presented to the ED with worsening shortness of breath as well as cough and congestion since diagnosis of flu on 2/3 and was sent home with some Tessalon  Perles after breathing treatment.  She has had some low-grade fevers and chills as well as worsening dyspnea for which she presented to the urgent care.  She has sick family members as well and was instructed to come to the ED due to noted hypoxemia with oxygen saturation in the high 80th percentile.   ED Course: Vital signs stable and patient afebrile.  BNP 170 and chest x-ray with some findings of viral/atypical pneumonia.  She was started on some antibiotics empirically as well as breathing treatments and remains on 3-4 L nasal cannula oxygen.  Review of Systems: Reviewed as noted above, otherwise negative.  Past Medical History:  Diagnosis Date   Chronic constipation    Cirrhosis (HCC)    child pugh class A and MELD 5, completed hep A and B vaccine series, no HCC on 3/16 MRI   Colonic inertia    Depression    Diverticula of colon    Diverticulosis 01/31/2008   colonoscopy Dr Riley Cheadle   GERD (gastroesophageal reflux disease)    Hyperplastic colon polyp    Liver mass    Macular degeneration    Obesity    PUD (peptic ulcer disease) 1988   Schizoaffective disorder    Mental Health-Dr Dekle   Seizures (HCC)    as child; no medsd and has had no seizures since then.   Wrist fracture    Left wrist s/p fall and surgical repair    Past Surgical History:  Procedure Laterality Date   ABDOMINAL HYSTERECTOMY     CHOLECYSTECTOMY  08/2010   cholelithiasis; Dr Larrie Po   COLONOSCOPY  01/31/2008   Dr. Riley Cheadle-  normal rectum, L sided diverticula, long tortuous colon. hyperplasitic  polyp.   COLONOSCOPY WITH PROPOFOL  N/A 10/19/2017    single 4 mm polyp in the sigmoid colon which was sessile, scattered small mouth diverticula in the sigmoid and descending colon, otherwise normal.  Surgical pathology found the polyp to be hyperplastic.  Recommended 10-year repeat colonoscopy.   ERCP W/ SPHINCTEROTOMY AND BALLOON DILATION  08/05/2010   normal ampulla/mild erythema in the gastric remnant without ulcerations or erosions. normal retroflexed view of the cardia/anastomosis normal/distal common bile duct stones s/p extraction and shincterotomy   ESOPHAGOGASTRODUODENOSCOPY N/A 10/24/2013   GUY:QIHKVQ esophagus. Surgically altered stomach. Abnormal gastric mucosa  -  status post biopsy (reactive gastrophathy)   ESOPHAGOGASTRODUODENOSCOPY (EGD) WITH PROPOFOL  N/A 11/30/2015   LA grade A esophagitis, no varices, surveillance in 2019   ESOPHAGOGASTRODUODENOSCOPY (EGD) WITH PROPOFOL  N/A 10/19/2017   erosive reflux esophagitis status post dilation, surgically altered stomach status post gastric bypass with a single patent apparent small bowel limb, small hiatal hernia.   ESOPHAGOGASTRODUODENOSCOPY (EGD) WITH PROPOFOL  N/A 07/16/2021   normal esophagus, no varices, portal gastropathy. 3 year screening   EUS N/A 08/20/2015   Dr. Howard Macho: well-circumscribed 5.2 cm chronic mass in porta hepatis, internally mass is gelatinous. Small distal esophageal varices (limited view), Bilroth 1 type anatomy, cytology NEGATIVE for malignant cells. Suspicion for neoplasm low    FOOT SURGERY  left   GASTRIC BYPASS     with Revision, Dr. Gradie Lawless DILATION N/A 10/19/2017   Procedure: Londa Rival DILATION;  Surgeon: Suzette Espy, MD;  Location: [facility];  Service: Endoscopy;  Laterality: N/A;   POLYPECTOMY  10/19/2017   Procedure: POLYPECTOMY;  Surgeon: Suzette Espy, MD;  Location: [facility];  Service: Endoscopy;;   sigmoid colon (CS)    REDUCTION MAMMAPLASTY Bilateral    S/P Hysterectomy     partial   TONSILLECTOMY     WRIST SURGERY Left 05/2016     reports that she has been smoking cigarettes. She has a 36 pack-year smoking history. She has never used smokeless tobacco. She reports current drug use. Drug: Marijuana. She reports that she does not drink alcohol.  Allergies  Allergen Reactions   Ciprofloxacin Rash   Kiwi Extract Other (See Comments)    Diarrhea and abdominal pain    Family History  Problem Relation Age of Onset   Diabetes Father    Colon cancer Maternal Uncle    Liver cancer Maternal Uncle        2 years old   Liver disease Neg Hx     Prior to Admission medications   Medication Sig Start Date End Date Taking? Authorizing Provider  albuterol  (VENTOLIN  HFA) 108 (90 Base) MCG/ACT inhaler Inhale 1-2 puffs into the lungs every 6 (six) hours as needed for shortness of breath or wheezing. 10/09/23  Yes Early Glisson, MD  FLUoxetine  (PROZAC ) 40 MG capsule Take 40 mg by mouth in the morning. 06/06/12  Yes [provider]  methocarbamol  (ROBAXIN ) 500 MG tablet Take 500 mg by mouth 4 (four) times daily.   Yes [provider]  ondansetron  (ZOFRAN ) 4 MG tablet Take 1 tablet (4 mg total) by mouth every 8 (eight) hours as needed for nausea or vomiting. 06/15/23  Yes Delman Ferns, NP  pantoprazole  (PROTONIX ) 40 MG tablet TAKE 1 TABLET BY MOUTH DAILY 06/12/23  Yes Delman Ferns, NP  risperiDONE  (RISPERDAL ) 3 MG tablet Take 3 mg by mouth at bedtime.    Yes [provider]  traZODone  (DESYREL ) 150 MG tablet Take 300 mg by mouth at bedtime. 07/20/15  Yes [provider]    Physical Exam: Vitals:   10/16/23 1300 10/16/23 1315 10/16/23 1400 10/16/23 1525  BP: 124/66 118/80 (!) 110/57   Pulse: 68 64 79   Resp: (!) 24 19 (!) 21   Temp:    98.2 F (36.8 C)  TempSrc:      SpO2: 92% 96% 93%   Weight:      Height:        Constitutional: NAD, calm,  comfortable Vitals:   10/16/23 1300 10/16/23 1315 10/16/23 1400 10/16/23 1525  BP: 124/66 118/80 (!) 110/57   Pulse: 68 64 79   Resp: (!) 24 19 (!) 21   Temp:    98.2 F (36.8 C)  TempSrc:      SpO2: 92% 96% 93%   Weight:      Height:       Eyes: lids and conjunctivae normal Neck: normal, supple Respiratory: clear to auscultation bilaterally. Normal respiratory effort. No accessory muscle use.  3-4 L nasal cannula Cardiovascular: Regular rate and rhythm, no murmurs. Abdomen: no tenderness, no distention. Bowel sounds positive.  Musculoskeletal:  No edema. Skin: no rashes, lesions, ulcers.  Psychiatric: Flat affect  Labs on Admission: I have personally reviewed following labs and imaging studies  CBC: Recent  Labs  Lab 10/16/23 1342  WBC 10.1  NEUTROABS 9.0*  HGB 13.1  HCT 39.3  MCV 90.3  PLT 305   Basic Metabolic Panel: Recent Labs  Lab 10/16/23 1342  NA 138  K 3.5  CL 104  CO2 25  GLUCOSE 116*  BUN 13  CREATININE 0.92  CALCIUM 8.7*   GFR: Estimated Creatinine Clearance: 66.6 mL/min (by C-G formula based on SCr of 0.92 mg/dL). Liver Function Tests: Recent Labs  Lab 10/16/23 1342  AST 29  ALT 17  ALKPHOS 66  BILITOT 0.8  PROT 7.0  ALBUMIN 3.1*   No results for input(s): "LIPASE", "AMYLASE" in the last 168 hours. No results for input(s): "AMMONIA" in the last 168 hours. Coagulation Profile: Recent Labs  Lab 10/16/23 1342  INR 1.0   Cardiac Enzymes: No results for input(s): "CKTOTAL", "CKMB", "CKMBINDEX", "TROPONINI" in the last 168 hours. BNP (last 3 results) No results for input(s): "PROBNP" in the last 8760 hours. HbA1C: No results for input(s): "HGBA1C" in the last 72 hours. CBG: No results for input(s): "GLUCAP" in the last 168 hours. Lipid Profile: No results for input(s): "CHOL", "HDL", "LDLCALC", "TRIG", "CHOLHDL", "LDLDIRECT" in the last 72 hours. Thyroid  Function Tests: No results for input(s): "TSH", "T4TOTAL", "FREET4", "T3FREE",  "THYROIDAB" in the last 72 hours. Anemia Panel: No results for input(s): "VITAMINB12", "FOLATE", "FERRITIN", "TIBC", "IRON", "RETICCTPCT" in the last 72 hours. Urine analysis:    Component Value Date/Time   COLORURINE AMBER (A) 10/16/2023 1414   APPEARANCEUR HAZY (A) 10/16/2023 1414   LABSPEC 1.023 10/16/2023 1414   PHURINE 5.0 10/16/2023 1414   GLUCOSEU NEGATIVE 10/16/2023 1414   HGBUR NEGATIVE 10/16/2023 1414   BILIRUBINUR NEGATIVE 10/16/2023 1414   KETONESUR 5 (A) 10/16/2023 1414   PROTEINUR 100 (A) 10/16/2023 1414   UROBILINOGEN 0.2 07/31/2010 0920   NITRITE NEGATIVE 10/16/2023 1414   LEUKOCYTESUR NEGATIVE 10/16/2023 1414    Radiological Exams on Admission: DG Chest Port 1 View Result Date: 10/16/2023 CLINICAL DATA:  Ongoing upper respiratory tract symptoms with recent flu. Cough. EXAM: PORTABLE CHEST 1 VIEW COMPARISON:  10/09/2023 and CT chest 12/13/2017. FINDINGS: Trachea is midline. Heart size stable. Thoracic aorta is calcified. Mild diffuse interstitial prominence, right greater than left. There is opacity along the cardiac apex. No definite pleural fluid. Surgical clips in the left upper quadrant. IMPRESSION: 1. Mild interstitial prominence, right greater than left, possibly due to edema. Viral or atypical pneumonia not excluded. 2. Opacity along the left heart border may be due to airspace consolidation and therefore bronchopneumonia. Followup PA and lateral chest X-ray is recommended in 3-4 weeks following trial of antibiotic therapy to ensure resolution and exclude underlying malignancy. Electronically Signed   By: Shearon Denis M.D.   On: 10/16/2023 14:56    EKG: Independently reviewed. SR 65bpm.  Assessment/Plan Principal Problem:   Acute hypoxemic respiratory failure (HCC) Active Problems:   Schizoaffective disorder (HCC)   GERD (gastroesophageal reflux disease)    Acute hypoxemic respiratory failure secondary to COPD exacerbation in the setting of influenza  A -Wean oxygen to baseline room air as tolerated, but may likely require some oxygen supplementation on discharge -Check procalcitonin chest x-ray simply demonstrating atypical pneumonia, continue current antibiotics and wean pending procalcitonin -Breathing treatments as needed -No indication for Tamiflu at this time given diagnosis on 2/3  Schizoaffective disorder -Continue home medications  GERD -PPI  Obesity class II -BMI 36.22   DVT prophylaxis: Lovenox  Code Status: DNR Family Communication: None at bedside Disposition Plan:  Admit for acute hypoxemic respiratory failure Consults called: None Admission status: Observation, telemetry  Severity of Illness: The appropriate patient status for this patient is OBSERVATION. Observation status is judged to be reasonable and necessary in order to provide the required intensity of service to ensure the patient's safety. The patient's presenting symptoms, physical exam findings, and initial radiographic and laboratory data in the context of their medical condition is felt to place them at decreased risk for further clinical deterioration. Furthermore, it is anticipated that the patient will be medically stable for discharge from the hospital within 2 midnights of admission.    Roise Emert D Mason Sole DO [facility]  If 7PM-7AM, please contact night-coverage [email or web address]  10/16/2023, 3:40 PM

## 2023-10-16 NOTE — ED Triage Notes (Addendum)
 Pt is here c/o flu like symptoms diagnosis with the flu on Friday. Productive cough.Patient reports SOB. Patient  oxygen is at 85%. Provider called to the office. Patient is hard of hearing. Patient placed on 4 L of oxygen.

## 2023-10-17 ENCOUNTER — Encounter (HOSPITAL_COMMUNITY): Payer: Self-pay | Admitting: Internal Medicine

## 2023-10-17 ENCOUNTER — Other Ambulatory Visit: Payer: Self-pay

## 2023-10-17 DIAGNOSIS — J9601 Acute respiratory failure with hypoxia: Secondary | ICD-10-CM | POA: Diagnosis present

## 2023-10-17 DIAGNOSIS — R0602 Shortness of breath: Secondary | ICD-10-CM | POA: Diagnosis present

## 2023-10-17 DIAGNOSIS — Z9884 Bariatric surgery status: Secondary | ICD-10-CM | POA: Diagnosis not present

## 2023-10-17 DIAGNOSIS — Z8711 Personal history of peptic ulcer disease: Secondary | ICD-10-CM | POA: Diagnosis not present

## 2023-10-17 DIAGNOSIS — Z6836 Body mass index (BMI) 36.0-36.9, adult: Secondary | ICD-10-CM | POA: Diagnosis not present

## 2023-10-17 DIAGNOSIS — E66812 Obesity, class 2: Secondary | ICD-10-CM | POA: Diagnosis present

## 2023-10-17 DIAGNOSIS — Z79899 Other long term (current) drug therapy: Secondary | ICD-10-CM | POA: Diagnosis not present

## 2023-10-17 DIAGNOSIS — J129 Viral pneumonia, unspecified: Secondary | ICD-10-CM | POA: Diagnosis present

## 2023-10-17 DIAGNOSIS — F32A Depression, unspecified: Secondary | ICD-10-CM | POA: Diagnosis present

## 2023-10-17 DIAGNOSIS — Z860100 Personal history of colon polyps, unspecified: Secondary | ICD-10-CM | POA: Diagnosis not present

## 2023-10-17 DIAGNOSIS — H9190 Unspecified hearing loss, unspecified ear: Secondary | ICD-10-CM | POA: Diagnosis present

## 2023-10-17 DIAGNOSIS — F17210 Nicotine dependence, cigarettes, uncomplicated: Secondary | ICD-10-CM | POA: Diagnosis present

## 2023-10-17 DIAGNOSIS — J1008 Influenza due to other identified influenza virus with other specified pneumonia: Secondary | ICD-10-CM | POA: Diagnosis present

## 2023-10-17 DIAGNOSIS — K219 Gastro-esophageal reflux disease without esophagitis: Secondary | ICD-10-CM | POA: Diagnosis present

## 2023-10-17 DIAGNOSIS — F250 Schizoaffective disorder, bipolar type: Secondary | ICD-10-CM | POA: Diagnosis not present

## 2023-10-17 DIAGNOSIS — J441 Chronic obstructive pulmonary disease with (acute) exacerbation: Secondary | ICD-10-CM | POA: Diagnosis present

## 2023-10-17 DIAGNOSIS — Z800 Family history of malignant neoplasm of digestive organs: Secondary | ICD-10-CM | POA: Diagnosis not present

## 2023-10-17 DIAGNOSIS — F259 Schizoaffective disorder, unspecified: Secondary | ICD-10-CM | POA: Diagnosis present

## 2023-10-17 DIAGNOSIS — Z66 Do not resuscitate: Secondary | ICD-10-CM | POA: Diagnosis present

## 2023-10-17 DIAGNOSIS — Z833 Family history of diabetes mellitus: Secondary | ICD-10-CM | POA: Diagnosis not present

## 2023-10-17 DIAGNOSIS — R001 Bradycardia, unspecified: Secondary | ICD-10-CM | POA: Diagnosis not present

## 2023-10-17 DIAGNOSIS — Z9049 Acquired absence of other specified parts of digestive tract: Secondary | ICD-10-CM | POA: Diagnosis not present

## 2023-10-17 DIAGNOSIS — H3530 Unspecified macular degeneration: Secondary | ICD-10-CM | POA: Diagnosis present

## 2023-10-17 DIAGNOSIS — Z881 Allergy status to other antibiotic agents status: Secondary | ICD-10-CM | POA: Diagnosis not present

## 2023-10-17 DIAGNOSIS — J440 Chronic obstructive pulmonary disease with acute lower respiratory infection: Secondary | ICD-10-CM | POA: Diagnosis present

## 2023-10-17 DIAGNOSIS — K7460 Unspecified cirrhosis of liver: Secondary | ICD-10-CM | POA: Diagnosis present

## 2023-10-17 LAB — CBC
HCT: 36.8 % (ref 36.0–46.0)
Hemoglobin: 11.9 g/dL — ABNORMAL LOW (ref 12.0–15.0)
MCH: 29.2 pg (ref 26.0–34.0)
MCHC: 32.3 g/dL (ref 30.0–36.0)
MCV: 90.4 fL (ref 80.0–100.0)
Platelets: 324 10*3/uL (ref 150–400)
RBC: 4.07 MIL/uL (ref 3.87–5.11)
RDW: 14 % (ref 11.5–15.5)
WBC: 11.7 10*3/uL — ABNORMAL HIGH (ref 4.0–10.5)
nRBC: 0 % (ref 0.0–0.2)

## 2023-10-17 LAB — BASIC METABOLIC PANEL
Anion gap: 9 (ref 5–15)
BUN: 18 mg/dL (ref 8–23)
CO2: 23 mmol/L (ref 22–32)
Calcium: 8.4 mg/dL — ABNORMAL LOW (ref 8.9–10.3)
Chloride: 105 mmol/L (ref 98–111)
Creatinine, Ser: 0.92 mg/dL (ref 0.44–1.00)
GFR, Estimated: >60 mL/min (ref >60)
Glucose, Bld: 162 mg/dL — ABNORMAL HIGH (ref 70–99)
Potassium: 3.5 mmol/L (ref 3.5–5.1)
Sodium: 137 mmol/L (ref 135–145)

## 2023-10-17 LAB — MAGNESIUM: Magnesium: 2.1 mg/dL (ref 1.7–2.4)

## 2023-10-17 LAB — HIV ANTIBODY (ROUTINE TESTING W REFLEX): HIV Screen 4th Generation wRfx: NONREACTIVE

## 2023-10-17 MED ORDER — METHYLPREDNISOLONE SODIUM SUCC 40 MG IJ SOLR
40.0000 mg | Freq: Two times a day (BID) | INTRAMUSCULAR | Status: DC
  Administered 2023-10-17 – 2023-10-19 (×5): 40 mg via INTRAVENOUS
  Filled 2023-10-17 (×5): qty 1

## 2023-10-17 NOTE — Progress Notes (Signed)
   10/17/23 1251  TOC Brief Assessment  Insurance and Status Reviewed  Patient has primary care physician Yes  Home environment has been reviewed home  Prior level of function: independent  Prior/Current Home Services No current home services  Social Drivers of Health Review SDOH reviewed no interventions necessary  Readmission risk has been reviewed Yes  Transition of care needs no transition of care needs at this time   Transition of Care Department [facility]) has reviewed patient and no TOC needs have been identified at this time. We will continue to monitor patient advancement through interdisciplinary progression rounds. If new patient transition needs arise, please place a TOC consult.

## 2023-10-17 NOTE — ED Notes (Signed)
ED TO INPATIENT HANDOFF REPORT  ED Nurse Name and Phone #: Lawanna Kobus, RN   S Name/Age/Gender [name] 68 y.o. female Room/Bed: APA04/APA04  Code Status   Code Status: Limited: Do not attempt resuscitation (DNR) -DNR-LIMITED -Do Not Intubate/DNI   Home/SNF/Other Home Patient oriented to: self, place, time, and situation Is this baseline? Yes   Triage Complete: Triage complete  Chief Complaint Acute hypoxemic respiratory failure (HCC) [J96.01]  Triage Note Pt c/o ongoing URI symptoms for the last two weeks, diagnosed with the flu at that time. Pt states that she went to UC for continued non-productive cough and they found her to be 89% on RA. Pt does not wear oxygen at baseline. EMS administered 2.5 mg albuterol and 125 mg SoluMedrol IV enroute.    Allergies Allergies  Allergen Reactions   Ciprofloxacin Rash   Kiwi Extract Other (See Comments)    Diarrhea and abdominal pain    Level of Care/Admitting Diagnosis ED Disposition     ED Disposition  Admit   Condition  --   Comment  Hospital Area: [facility] [[id number]]  Level of Care: Telemetry [5]  Covid Evaluation: Confirmed COVID Negative  Diagnosis: Acute hypoxemic respiratory failure [facility]) [[id number]]  Admitting Physician: Erick Blinks [[id number]]  Attending Physician: Erick Blinks [[id number]]          B Medical/Surgery History Past Medical History:  Diagnosis Date   Chronic constipation    Cirrhosis (HCC)    child pugh class A and MELD 5, completed hep A and B vaccine series, no HCC on 3/16 MRI   Colonic inertia    Depression    Diverticula of colon    Diverticulosis 01/31/2008   colonoscopy Dr Jena Gauss   GERD (gastroesophageal reflux disease)    Hyperplastic colon polyp    Liver mass    Macular degeneration    Obesity    PUD (peptic ulcer disease) 1988   Schizoaffective disorder    Mental Health-Dr Dekle   Seizures (HCC)    as child; no medsd and has had no seizures since then.   Wrist  fracture    Left wrist s/p fall and surgical repair   Past Surgical History:  Procedure Laterality Date   ABDOMINAL HYSTERECTOMY     CHOLECYSTECTOMY  08/2010   cholelithiasis; Dr Lovell Sheehan   COLONOSCOPY  01/31/2008   Dr. Jena Gauss- normal rectum, L sided diverticula, long tortuous colon. hyperplasitic  polyp.   COLONOSCOPY WITH PROPOFOL N/A 10/19/2017    single 4 mm polyp in the sigmoid colon which was sessile, scattered small mouth diverticula in the sigmoid and descending colon, otherwise normal.  Surgical pathology found the polyp to be hyperplastic.  Recommended 10-year repeat colonoscopy.   ERCP W/ SPHINCTEROTOMY AND BALLOON DILATION  08/05/2010   normal ampulla/mild erythema in the gastric remnant without ulcerations or erosions. normal retroflexed view of the cardia/anastomosis normal/distal common bile duct stones s/p extraction and shincterotomy   ESOPHAGOGASTRODUODENOSCOPY N/A 10/24/2013   NFA:OZHYQM esophagus. Surgically altered stomach. Abnormal gastric mucosa  -  status post biopsy (reactive gastrophathy)   ESOPHAGOGASTRODUODENOSCOPY (EGD) WITH PROPOFOL N/A 11/30/2015   LA grade A esophagitis, no varices, surveillance in 2019   ESOPHAGOGASTRODUODENOSCOPY (EGD) WITH PROPOFOL N/A 10/19/2017   erosive reflux esophagitis status post dilation, surgically altered stomach status post gastric bypass with a single patent apparent small bowel limb, small hiatal hernia.   ESOPHAGOGASTRODUODENOSCOPY (EGD) WITH PROPOFOL N/A 07/16/2021   normal esophagus, no varices, portal gastropathy. 3 year screening  EUS N/A 08/20/2015   Dr. Christella Hartigan: well-circumscribed 5.2 cm chronic mass in porta hepatis, internally mass is gelatinous. Small distal esophageal varices (limited view), Bilroth 1 type anatomy, cytology NEGATIVE for malignant cells. Suspicion for neoplasm low    FOOT SURGERY     left   GASTRIC BYPASS     with Revision, Dr. Rickard Patience DILATION N/A 10/19/2017   Procedure: Elease Hashimoto  DILATION;  Surgeon: Corbin Ade, MD;  Location: [facility];  Service: Endoscopy;  Laterality: N/A;   POLYPECTOMY  10/19/2017   Procedure: POLYPECTOMY;  Surgeon: Corbin Ade, MD;  Location: [facility];  Service: Endoscopy;;  sigmoid colon (CS)    REDUCTION MAMMAPLASTY Bilateral    S/P Hysterectomy     partial   TONSILLECTOMY     WRIST SURGERY Left 05/2016     A IV Location/Drains/Wounds Patient Lines/Drains/Airways Status     Active Line/Drains/Airways     Name Placement date Placement time Site Days   Peripheral IV 10/16/23 20 G 1" Left Antecubital 10/16/23  1350  Antecubital  1            Intake/Output Last 24 hours No intake or output data in the 24 hours ending 10/17/23 0759  Labs/Imaging Results for orders placed or performed during the hospital encounter of 10/16/23 (from the past 48 hours)  Brain natriuretic peptide     Status: Abnormal   Collection Time: 10/16/23  1:39 PM  Result Value Ref Range   B Natriuretic Peptide 170.0 (H) 0.0 - 100.0 pg/mL    Comment: Performed at [facility], [address] [place], [place] [address]  Lactic acid, plasma     Status: None   Collection Time: 10/16/23  1:42 PM  Result Value Ref Range   Lactic Acid, Venous 0.8 0.5 - 1.9 mmol/L    Comment: Performed at [facility], [address] [place], [place] [address]  Comprehensive metabolic panel     Status: Abnormal   Collection Time: 10/16/23  1:42 PM  Result Value Ref Range   Sodium 138 135 - 145 mmol/L   Potassium 3.5 3.5 - 5.1 mmol/L   Chloride 104 98 - 111 mmol/L   CO2 25 22 - 32 mmol/L   Glucose, Bld 116 (H) 70 - 99 mg/dL    Comment: Glucose reference range applies only to samples taken after fasting for at least 8 hours.   BUN 13 8 - 23 mg/dL   Creatinine, Ser 6.57 0.44 - 1.00 mg/dL   Calcium 8.7 (L) 8.9 - 10.3 mg/dL   Total Protein 7.0 6.5 - 8.1 g/dL   Albumin 3.1 (L) 3.5 - 5.0 g/dL   AST 29 15 - 41 U/L   ALT 17 0 - 44 U/L   Alkaline Phosphatase 66 38  - 126 U/L   Total Bilirubin 0.8 0.0 - 1.2 mg/dL   GFR, Estimated >84 >69 mL/min    Comment: (NOTE) Calculated using the CKD-EPI Creatinine Equation (2021)    Anion gap 9 5 - 15    Comment: Performed at [facility], [address], [place], [place] [address]  CBC with Differential     Status: Abnormal   Collection Time: 10/16/23  1:42 PM  Result Value Ref Range   WBC 10.1 4.0 - 10.5 K/uL   RBC 4.35 3.87 - 5.11 MIL/uL   Hemoglobin 13.1 12.0 - 15.0 g/dL   HCT 84.1 32.4 - 40.1 %   MCV 90.3 80.0 - 100.0 fL  MCH 30.1 26.0 - 34.0 pg   MCHC 33.3 30.0 - 36.0 g/dL   RDW 16.1 09.6 - 04.5 %   Platelets 305 150 - 400 K/uL   nRBC 0.0 0.0 - 0.2 %   Neutrophils Relative % 88 %   Neutro Abs 9.0 (H) 1.7 - 7.7 K/uL   Lymphocytes Relative 7 %   Lymphs Abs 0.7 0.7 - 4.0 K/uL   Monocytes Relative 4 %   Monocytes Absolute 0.4 0.1 - 1.0 K/uL   Eosinophils Relative 0 %   Eosinophils Absolute 0.0 0.0 - 0.5 K/uL   Basophils Relative 0 %   Basophils Absolute 0.0 0.0 - 0.1 K/uL   Immature Granulocytes 1 %   Abs Immature Granulocytes 0.05 0.00 - 0.07 K/uL    Comment: Performed at [facility], [address] [place], [place] [address]  Protime-INR     Status: None   Collection Time: 10/16/23  1:42 PM  Result Value Ref Range   Prothrombin Time 13.8 11.4 - 15.2 seconds   INR 1.0 0.8 - 1.2    Comment: (NOTE) INR goal varies based on device and disease states. Performed at [facility], [address] [place], [place] [address]   APTT     Status: None   Collection Time: 10/16/23  1:42 PM  Result Value Ref Range   aPTT 30 24 - 36 seconds    Comment: Performed at [facility], [address], [place], [place] [address]  Blood culture (routine single)     Status: None (Preliminary result)   Collection Time: 10/16/23  1:42 PM   Specimen: BLOOD RIGHT HAND  Result Value Ref Range   Specimen Description      BLOOD RIGHT HAND BOTTLES DRAWN AEROBIC AND ANAEROBIC   Special Requests      Blood Culture  results may not be optimal due to an inadequate volume of blood received in culture bottles   Culture      NO GROWTH < 24 HOURS Performed at [facility], [address], [place], [place] [address]    Report Status PENDING   Procalcitonin     Status: None   Collection Time: 10/16/23  1:42 PM  Result Value Ref Range   Procalcitonin <0.10 ng/mL    Comment:        Interpretation: PCT (Procalcitonin) <= 0.5 ng/mL: Systemic infection (sepsis) is not likely. Local bacterial infection is possible. (NOTE)       Sepsis PCT Algorithm           Lower Respiratory Tract                                      Infection PCT Algorithm    ----------------------------     ----------------------------         PCT < 0.25 ng/mL                PCT < 0.10 ng/mL          Strongly encourage             Strongly discourage   discontinuation of antibiotics    initiation of antibiotics    ----------------------------     -----------------------------       PCT 0.25 - 0.50 ng/mL            PCT 0.10 - 0.25 ng/mL  OR       >80% decrease in PCT            Discourage initiation of                                            antibiotics      Encourage discontinuation           of antibiotics    ----------------------------     -----------------------------         PCT >= 0.50 ng/mL              PCT 0.26 - 0.50 ng/mL               AND        <80% decrease in PCT             Encourage initiation of                                             antibiotics       Encourage continuation           of antibiotics    ----------------------------     -----------------------------        PCT >= 0.50 ng/mL                  PCT > 0.50 ng/mL               AND         increase in PCT                  Strongly encourage                                      initiation of antibiotics    Strongly encourage escalation           of antibiotics                                     -----------------------------                                            PCT <= 0.25 ng/mL                                                 OR                                        > 80% decrease in PCT                                      Discontinue / Do not initiate  antibiotics  Performed at [facility], [address], [place], [place] [address]   Urinalysis, w/ Reflex to Culture (Infection Suspected) -Urine, Clean Catch     Status: Abnormal   Collection Time: 10/16/23  2:14 PM  Result Value Ref Range   Specimen Source URINE, CLEAN CATCH    Color, Urine AMBER (A) YELLOW    Comment: BIOCHEMICALS MAY BE AFFECTED BY COLOR   APPearance HAZY (A) CLEAR   Specific Gravity, Urine 1.023 1.005 - 1.030   pH 5.0 5.0 - 8.0   Glucose, UA NEGATIVE NEGATIVE mg/dL   Hgb urine dipstick NEGATIVE NEGATIVE   Bilirubin Urine NEGATIVE NEGATIVE   Ketones, ur 5 (A) NEGATIVE mg/dL   Protein, ur 604 (A) NEGATIVE mg/dL   Nitrite NEGATIVE NEGATIVE   Leukocytes,Ua NEGATIVE NEGATIVE   RBC / HPF 0-5 0 - 5 RBC/hpf   WBC, UA 0-5 0 - 5 WBC/hpf    Comment:        Reflex urine culture not performed if WBC <=10, OR if Squamous epithelial cells >5. If Squamous epithelial cells >5 suggest recollection.    Bacteria, UA RARE (A) NONE SEEN   Squamous Epithelial / HPF 0-5 0 - 5 /HPF   Mucus PRESENT    Hyaline Casts, UA PRESENT     Comment: Performed at [facility], [address], [place], [place] [address]  Magnesium     Status: None   Collection Time: 10/17/23  3:28 AM  Result Value Ref Range   Magnesium 2.1 1.7 - 2.4 mg/dL    Comment: Performed at [facility], [address], [place], [place] [address]  Basic metabolic panel     Status: Abnormal   Collection Time: 10/17/23  3:28 AM  Result Value Ref Range   Sodium 137 135 - 145 mmol/L   Potassium 3.5 3.5 - 5.1 mmol/L   Chloride 105 98 - 111 mmol/L   CO2 23 22 - 32 mmol/L   Glucose, Bld 162 (H) 70 - 99 mg/dL    Comment: Glucose reference range  applies only to samples taken after fasting for at least 8 hours.   BUN 18 8 - 23 mg/dL   Creatinine, Ser 7.82 0.44 - 1.00 mg/dL   Calcium 8.4 (L) 8.9 - 10.3 mg/dL   GFR, Estimated >95 >62 mL/min    Comment: (NOTE) Calculated using the CKD-EPI Creatinine Equation (2021)    Anion gap 9 5 - 15    Comment: Performed at [facility], [address] [place], [place] [address]  CBC     Status: Abnormal   Collection Time: 10/17/23  3:28 AM  Result Value Ref Range   WBC 11.7 (H) 4.0 - 10.5 K/uL   RBC 4.07 3.87 - 5.11 MIL/uL   Hemoglobin 11.9 (L) 12.0 - 15.0 g/dL   HCT 57.8 46.9 - 62.9 %   MCV 90.4 80.0 - 100.0 fL   MCH 29.2 26.0 - 34.0 pg   MCHC 32.3 30.0 - 36.0 g/dL   RDW 52.8 41.3 - 24.4 %   Platelets 324 150 - 400 K/uL   nRBC 0.0 0.0 - 0.2 %    Comment: Performed at [facility], [address] [place], [place] [address]   DG Chest Port 1 View Result Date: 10/16/2023 CLINICAL DATA:  Ongoing upper respiratory tract symptoms with recent flu. Cough. EXAM: PORTABLE CHEST 1 VIEW COMPARISON:  10/09/2023 and CT chest 12/13/2017. FINDINGS: Trachea is midline. Heart size stable. Thoracic aorta is calcified. Mild diffuse interstitial prominence,  right greater than left. There is opacity along the cardiac apex. No definite pleural fluid. Surgical clips in the left upper quadrant. IMPRESSION: 1. Mild interstitial prominence, right greater than left, possibly due to edema. Viral or atypical pneumonia not excluded. 2. Opacity along the left heart border may be due to airspace consolidation and therefore bronchopneumonia. Followup PA and lateral chest X-Kaizer Dissinger is recommended in 3-4 weeks following trial of antibiotic therapy to ensure resolution and exclude underlying malignancy. Electronically Signed   By: Leanna Battles M.D.   On: 10/16/2023 14:56    Pending Labs Unresulted Labs (From admission, onward)     Start     Ordered   10/23/23 0500  Creatinine, serum  (enoxaparin (LOVENOX)    CrCl >/= 30  ml/min)  Weekly,   R     Comments: while on enoxaparin therapy    10/16/23 1548   10/17/23 0500  HIV Antibody (routine testing w rflx)  Once,   R        10/17/23 0500            Vitals/Pain Today's Vitals   10/17/23 0615 10/17/23 0726 10/17/23 0728 10/17/23 0730  BP: (!) 105/54 (!) 125/52  119/62  Pulse: (!) 48 (!) 52  71  Resp: (!) 23 20  (!) 22  Temp:   97.9 F (36.6 C)   TempSrc:   Oral   SpO2: 96% 96%  94%  Weight:      Height:      PainSc:        Isolation Precautions No active isolations  Medications Medications  cefTRIAXone (ROCEPHIN) 1 g in sodium chloride 0.9 % 100 mL IVPB (has no administration in time range)  FLUoxetine (PROZAC) capsule 40 mg (40 mg Oral Given 10/16/23 1610)  risperiDONE (RISPERDAL) tablet 3 mg (3 mg Oral Given by Other 10/16/23 2138)  traZODone (DESYREL) tablet 300 mg (300 mg Oral Given by Other 10/16/23 2139)  pantoprazole (PROTONIX) EC tablet 40 mg (40 mg Oral Given 10/16/23 1610)  methocarbamol (ROBAXIN) tablet 500 mg (500 mg Oral Given by Other 10/16/23 2139)  enoxaparin (LOVENOX) injection 40 mg (40 mg Subcutaneous Given 10/16/23 2140)  ipratropium-albuterol (DUONEB) 0.5-2.5 (3) MG/3ML nebulizer solution 3 mL (has no administration in time range)  acetaminophen (TYLENOL) tablet 650 mg (has no administration in time range)    Or  acetaminophen (TYLENOL) suppository 650 mg (has no administration in time range)  ondansetron (ZOFRAN) tablet 4 mg (has no administration in time range)    Or  ondansetron (ZOFRAN) injection 4 mg (has no administration in time range)  dextromethorphan-guaiFENesin (MUCINEX DM) 30-600 MG per 12 hr tablet 1 tablet (1 tablet Oral Given by Other 10/16/23 2139)  doxycycline (VIBRA-TABS) tablet 100 mg (100 mg Oral Given by Other 10/16/23 2139)  albuterol (PROVENTIL) (2.5 MG/3ML) 0.083% nebulizer solution (10 mg/hr Nebulization Given 10/16/23 1303)  ipratropium (ATROVENT) nebulizer solution 0.5 mg (0.5 mg Nebulization Given  10/16/23 1303)  cefTRIAXone (ROCEPHIN) 1 g in sodium chloride 0.9 % 100 mL IVPB (0 g Intravenous Stopped 10/16/23 1531)  doxycycline (VIBRA-TABS) tablet 100 mg (100 mg Oral Given 10/16/23 1445)    Mobility walks with device     Focused Assessments Pulmonary Assessment Handoff:  Lung sounds: Bilateral Breath Sounds: Expiratory wheezes L Breath Sounds: Diminished, Expiratory wheezes O2 Device: Nasal Cannula O2 Flow Rate (L/min): 4 L/min    R Recommendations: See Admitting Provider Note  Report given to:   Additional Notes: Patient a&0x4, on 4L [place] actue.  Wheezing noted when ausculting lung sounds.

## 2023-10-17 NOTE — Plan of Care (Signed)

## 2023-10-17 NOTE — Progress Notes (Signed)
PROGRESS NOTE    [name]  [id number] DOB: 01/29/1956 DOA: 10/16/2023 PCP: Marylynn Pearson, FNP   Brief Narrative:     [name] is a 68 y.o. female with medical history significant for COPD without chronic hypoxemia, GERD, and schizoaffective disorder who presented to the ED with worsening shortness of breath as well as cough and congestion since diagnosis of flu on 2/3 and was sent home with some Tessalon Perles after breathing treatment.  She has had some low-grade fevers and chills as well as worsening dyspnea for which she presented to the urgent care.  She has sick family members as well and was instructed to come to the ED due to noted hypoxemia with oxygen saturation in the high 80th percentile.  Patient was admitted with acute hypoxemic respiratory failure secondary to COPD exacerbation in the setting of influenza A.  Assessment & Plan:   Principal Problem:   Acute hypoxemic respiratory failure (HCC) Active Problems:   Schizoaffective disorder (HCC)   GERD (gastroesophageal reflux disease)  Assessment and Plan:  Acute hypoxemic respiratory failure secondary to COPD exacerbation in the setting of influenza A -Wean oxygen to baseline room air as tolerated, but may likely require some oxygen supplementation on discharge -Procalcitonin low, no concern for bacterial pneumonia, discontinue antibiotics 2/11 -Start IV Solu-Medrol 2/11 and continue to work on weaning oxygen requirements -Breathing treatments as needed -No indication for Tamiflu at this time given diagnosis on 2/3   Schizoaffective disorder -Continue home medications   GERD -PPI   Obesity class II -BMI 36.22    DVT prophylaxis: Lovenox Code Status: DNR Family Communication: None at bedside Disposition Plan:  Status is: Observation The patient will require care spanning > 2 midnights and should be moved to inpatient because: Need for IV medications.   Consultants:  None  Procedures:   None  Antimicrobials:  Anti-infectives (From admission, onward)    Start     Dose/Rate Route Frequency Ordered Stop   10/17/23 1200  cefTRIAXone (ROCEPHIN) 1 g in sodium chloride 0.9 % 100 mL IVPB  Status:  Discontinued        1 g 200 mL/hr over 30 Minutes Intravenous Every 24 hours 10/16/23 1548 10/17/23 1136   10/17/23 1000  doxycycline (VIBRA-TABS) tablet 100 mg  Status:  Discontinued        100 mg Oral Daily 10/16/23 1548 10/16/23 1558   10/16/23 1558  doxycycline (VIBRA-TABS) tablet 100 mg  Status:  Discontinued        100 mg Oral Every 12 hours 10/16/23 1558 10/17/23 1136   10/16/23 1430  cefTRIAXone (ROCEPHIN) 1 g in sodium chloride 0.9 % 100 mL IVPB        1 g 200 mL/hr over 30 Minutes Intravenous  Once 10/16/23 1419 10/16/23 1531   10/16/23 1430  doxycycline (VIBRA-TABS) tablet 100 mg        100 mg Oral  Once 10/16/23 1419 10/16/23 1445      Subjective: Patient seen and evaluated today with no new acute complaints or concerns. No acute concerns or events noted overnight.  She is starting to feel somewhat better, but still remains on nasal cannula oxygen.  Objective: Vitals:   10/17/23 0726 10/17/23 0728 10/17/23 0730 10/17/23 0905  BP: (!) 125/52  119/62 (!) 108/49  Pulse: (!) 52  71 74  Resp: 20  (!) 22   Temp:  97.9 F (36.6 C)  97.6 F (36.4 C)  TempSrc:  Oral  Oral  SpO2:  96%  94% 96%  Weight:      Height:    5\' 4"  (1.626 m)    Intake/Output Summary (Last 24 hours) at 10/17/2023 1137 Last data filed at 10/17/2023 0900 Gross per 24 hour  Intake 300 ml  Output --  Net 300 ml   Filed Weights   10/16/23 1120  Weight: 95.7 kg    Examination:  General exam: Appears calm and comfortable  Respiratory system: Clear to auscultation. Respiratory effort normal.  3-4 L nasal cannula Cardiovascular system: S1 & S2 heard, RRR.  Gastrointestinal system: Abdomen is soft Central nervous system: Alert and awake Extremities: No edema Skin: No significant lesions  noted Psychiatry: Flat affect.    Data Reviewed: I have personally reviewed following labs and imaging studies  CBC: Recent Labs  Lab 10/16/23 1342 10/17/23 0328  WBC 10.1 11.7*  NEUTROABS 9.0*  --   HGB 13.1 11.9*  HCT 39.3 36.8  MCV 90.3 90.4  PLT 305 324   Basic Metabolic Panel: Recent Labs  Lab 10/16/23 1342 10/17/23 0328  NA 138 137  K 3.5 3.5  CL 104 105  CO2 25 23  GLUCOSE 116* 162*  BUN 13 18  CREATININE 0.92 0.92  CALCIUM 8.7* 8.4*  MG  --  2.1   GFR: Estimated Creatinine Clearance: 66.6 mL/min (by C-G formula based on SCr of 0.92 mg/dL). Liver Function Tests: Recent Labs  Lab 10/16/23 1342  AST 29  ALT 17  ALKPHOS 66  BILITOT 0.8  PROT 7.0  ALBUMIN 3.1*   No results for input(s): "LIPASE", "AMYLASE" in the last 168 hours. No results for input(s): "AMMONIA" in the last 168 hours. Coagulation Profile: Recent Labs  Lab 10/16/23 1342  INR 1.0   Cardiac Enzymes: No results for input(s): "CKTOTAL", "CKMB", "CKMBINDEX", "TROPONINI" in the last 168 hours. BNP (last 3 results) No results for input(s): "PROBNP" in the last 8760 hours. HbA1C: No results for input(s): "HGBA1C" in the last 72 hours. CBG: No results for input(s): "GLUCAP" in the last 168 hours. Lipid Profile: No results for input(s): "CHOL", "HDL", "LDLCALC", "TRIG", "CHOLHDL", "LDLDIRECT" in the last 72 hours. Thyroid Function Tests: No results for input(s): "TSH", "T4TOTAL", "FREET4", "T3FREE", "THYROIDAB" in the last 72 hours. Anemia Panel: No results for input(s): "VITAMINB12", "FOLATE", "FERRITIN", "TIBC", "IRON", "RETICCTPCT" in the last 72 hours. Sepsis Labs: Recent Labs  Lab 10/16/23 1342  PROCALCITON <0.10  LATICACIDVEN 0.8    Recent Results (from the past 240 hours)  Resp panel by RT-PCR (RSV, Flu A&B, Covid) Anterior Nasal Swab     Status: Abnormal   Collection Time: 10/09/23  5:07 PM   Specimen: Anterior Nasal Swab  Result Value Ref Range Status   SARS  Coronavirus 2 by RT PCR NEGATIVE NEGATIVE Final    Comment: (NOTE) SARS-CoV-2 target nucleic acids are NOT DETECTED.  The SARS-CoV-2 RNA is generally detectable in upper respiratory specimens during the acute phase of infection. The lowest concentration of SARS-CoV-2 viral copies this assay can detect is 138 copies/mL. A negative result does not preclude SARS-Cov-2 infection and should not be used as the sole basis for treatment or other patient management decisions. A negative result may occur with  improper specimen collection/handling, submission of specimen other than nasopharyngeal swab, presence of viral mutation(s) within the areas targeted by this assay, and inadequate number of viral copies(<138 copies/mL). A negative result must be combined with clinical observations, patient history, and epidemiological information. The expected result is Negative.  Fact Sheet  for Patients:  BloggerCourse.com  Fact Sheet for Healthcare Providers:  SeriousBroker.it  This test is no t yet approved or cleared by the [place] FDA and  has been authorized for detection and/or diagnosis of SARS-CoV-2 by FDA under an Emergency Use Authorization (EUA). This EUA will remain  in effect (meaning this test can be used) for the duration of the COVID-19 declaration under Section 564(b)(1) of the Act, 21 U.S.C.section 360bbb-3(b)(1), unless the authorization is terminated  or revoked sooner.       Influenza A by PCR POSITIVE (A) NEGATIVE Final   Influenza B by PCR NEGATIVE NEGATIVE Final    Comment: (NOTE) The Xpert Xpress SARS-CoV-2/FLU/RSV plus assay is intended as an aid in the diagnosis of influenza from Nasopharyngeal swab specimens and should not be used as a sole basis for treatment. Nasal washings and aspirates are unacceptable for Xpert Xpress SARS-CoV-2/FLU/RSV testing.  Fact Sheet for  Patients: BloggerCourse.com  Fact Sheet for Healthcare Providers: SeriousBroker.it  This test is not yet approved or cleared by the [place] FDA and has been authorized for detection and/or diagnosis of SARS-CoV-2 by FDA under an Emergency Use Authorization (EUA). This EUA will remain in effect (meaning this test can be used) for the duration of the COVID-19 declaration under Section 564(b)(1) of the Act, 21 U.S.C. section 360bbb-3(b)(1), unless the authorization is terminated or revoked.     Resp Syncytial Virus by PCR NEGATIVE NEGATIVE Final    Comment: (NOTE) Fact Sheet for Patients: BloggerCourse.com  Fact Sheet for Healthcare Providers: SeriousBroker.it  This test is not yet approved or cleared by the [place] FDA and has been authorized for detection and/or diagnosis of SARS-CoV-2 by FDA under an Emergency Use Authorization (EUA). This EUA will remain in effect (meaning this test can be used) for the duration of the COVID-19 declaration under Section 564(b)(1) of the Act, 21 U.S.C. section 360bbb-3(b)(1), unless the authorization is terminated or revoked.  Performed at [facility], [address], [place], [place] [address]   Blood culture (routine single)     Status: None (Preliminary result)   Collection Time: 10/16/23  1:42 PM   Specimen: BLOOD RIGHT HAND  Result Value Ref Range Status   Specimen Description   Final    BLOOD RIGHT HAND BOTTLES DRAWN AEROBIC AND ANAEROBIC   Special Requests   Final    Blood Culture results may not be optimal due to an inadequate volume of blood received in culture bottles   Culture   Final    NO GROWTH < 24 HOURS Performed at [facility], [address], [place], [place] [address]    Report Status PENDING  Incomplete         Radiology Studies: DG Chest Port 1 View Result Date: 10/16/2023 CLINICAL DATA:  Ongoing  upper respiratory tract symptoms with recent flu. Cough. EXAM: PORTABLE CHEST 1 VIEW COMPARISON:  10/09/2023 and CT chest 12/13/2017. FINDINGS: Trachea is midline. Heart size stable. Thoracic aorta is calcified. Mild diffuse interstitial prominence, right greater than left. There is opacity along the cardiac apex. No definite pleural fluid. Surgical clips in the left upper quadrant. IMPRESSION: 1. Mild interstitial prominence, right greater than left, possibly due to edema. Viral or atypical pneumonia not excluded. 2. Opacity along the left heart border may be due to airspace consolidation and therefore bronchopneumonia. Followup PA and lateral chest X-ray is recommended in 3-4 weeks following trial of antibiotic therapy to ensure resolution and exclude underlying malignancy. Electronically Signed  By: Leanna Battles M.D.   On: 10/16/2023 14:56        Scheduled Meds:  dextromethorphan-guaiFENesin  1 tablet Oral BID   enoxaparin (LOVENOX) injection  40 mg Subcutaneous Q24H   FLUoxetine  40 mg Oral Daily   methocarbamol  500 mg Oral QID   methylPREDNISolone (SOLU-MEDROL) injection  40 mg Intravenous Q12H   pantoprazole  40 mg Oral Daily   risperiDONE  3 mg Oral QHS   traZODone  300 mg Oral QHS     LOS: 0 days    Time spent: 35 minutes    Bazil Dhanani Hoover Brunette, DO [facility]  If 7PM-7AM, please contact night-coverage [email or web address] 10/17/2023, 11:37 AM

## 2023-10-18 DIAGNOSIS — K219 Gastro-esophageal reflux disease without esophagitis: Secondary | ICD-10-CM | POA: Diagnosis not present

## 2023-10-18 DIAGNOSIS — F250 Schizoaffective disorder, bipolar type: Secondary | ICD-10-CM | POA: Diagnosis not present

## 2023-10-18 DIAGNOSIS — J9601 Acute respiratory failure with hypoxia: Secondary | ICD-10-CM | POA: Diagnosis not present

## 2023-10-18 LAB — CBC
HCT: 37.6 % (ref 36.0–46.0)
Hemoglobin: 12.1 g/dL (ref 12.0–15.0)
MCH: 29.5 pg (ref 26.0–34.0)
MCHC: 32.2 g/dL (ref 30.0–36.0)
MCV: 91.7 fL (ref 80.0–100.0)
Platelets: 406 K/uL — ABNORMAL HIGH (ref 150–400)
RBC: 4.1 MIL/uL (ref 3.87–5.11)
RDW: 14.3 % (ref 11.5–15.5)
WBC: 11.9 K/uL — ABNORMAL HIGH (ref 4.0–10.5)
nRBC: 0 % (ref 0.0–0.2)

## 2023-10-18 LAB — MAGNESIUM: Magnesium: 2.1 mg/dL (ref 1.7–2.4)

## 2023-10-18 LAB — BASIC METABOLIC PANEL
Anion gap: 8 (ref 5–15)
BUN: 23 mg/dL (ref 8–23)
CO2: 27 mmol/L (ref 22–32)
Calcium: 8.5 mg/dL — ABNORMAL LOW (ref 8.9–10.3)
Chloride: 105 mmol/L (ref 98–111)
Creatinine, Ser: 0.96 mg/dL (ref 0.44–1.00)
GFR, Estimated: >60 mL/min (ref >60)
Glucose, Bld: 108 mg/dL — ABNORMAL HIGH (ref 70–99)
Potassium: 4.2 mmol/L (ref 3.5–5.1)
Sodium: 140 mmol/L (ref 135–145)

## 2023-10-18 NOTE — Progress Notes (Signed)
PROGRESS NOTE   DIETRICH KE  VWU:[id number] DOB: 1956/06/30 DOA: 10/16/2023 PCP: Marylynn Pearson, FNP   Chief Complaint  Patient presents with   Shortness of Breath   Level of care: Med-Surg  Brief Admission History:  68 y.o. female with medical history significant for COPD without chronic hypoxemia, GERD, and schizoaffective disorder who presented to the ED with worsening shortness of breath as well as cough and congestion since diagnosis of flu on 2/3 and was sent home with some Tessalon Perles after breathing treatment.  She has had some low-grade fevers and chills as well as worsening dyspnea for which she presented to the urgent care.  She has sick family members as well and was instructed to come to the ED due to noted hypoxemia with oxygen saturation in the high 80th percentile.  Patient was admitted with acute hypoxemic respiratory failure secondary to COPD exacerbation in the setting of influenza A.    Assessment and Plan:  Acute hypoxemic respiratory failure secondary to COPD exacerbation in the setting of influenza A -pt has been slow to recover  -Wean oxygen to baseline room air as tolerated, but may likely require some oxygen supplementation on discharge as she still remains on 4L/min supplemental oxygen.  -Procalcitonin low, no concern for bacterial pneumonia, discontinued antibiotics 2/11 -continue IV Solu-Medrol 2/11 and continue to work on weaning oxygen requirements -Breathing treatments as needed -No indication for Tamiflu at this time given diagnosis on 2/3   Schizoaffective disorder -Continue home medications   GERD -PPI   Obesity class II -BMI 36.22  DVT prophylaxis: enoxaparin Code Status: Full  Family Communication:  Disposition: Status is: Inpatient   Consultants:   Procedures:   Antimicrobials:    Subjective: Patient says she still wheezing and coughing quite a bit and they have not been able to wean her nasal cannula requirements.  She  remains on 4 L Objective: Vitals:   10/17/23 1639 10/17/23 2224 10/18/23 0123 10/18/23 0501  BP: 121/64 (!) 105/52 (!) 130/59 (!) 87/53  Pulse: 63 (!) 56 (!) 56 (!) 58  Resp:   19 19  Temp: 99.1 F (37.3 C) 98.5 F (36.9 C) 97.9 F (36.6 C) 97.9 F (36.6 C)  TempSrc: Oral Oral Oral Oral  SpO2: 96% 97% 97% 94%  Weight:      Height:        Intake/Output Summary (Last 24 hours) at 10/18/2023 1524 Last data filed at 10/18/2023 0900 Gross per 24 hour  Intake 360 ml  Output 400 ml  Net -40 ml   Filed Weights   10/16/23 1120  Weight: 95.7 kg   Examination:  General exam: Appears calm and comfortable  Respiratory system: diffuse expiratory wheezing bilateral with rales heard as well. Cardiovascular system: normal S1 & S2 heard. No JVD, murmurs, rubs, gallops or clicks. No pedal edema. Gastrointestinal system: Abdomen is nondistended, soft and nontender. No organomegaly or masses felt. Normal bowel sounds heard. Central nervous system: Alert and oriented. No focal neurological deficits. Extremities: Symmetric 5 x 5 power. Skin: No rashes, lesions or ulcers. Psychiatry: Judgement and insight appear normal. Mood & affect appropriate.   Data Reviewed: I have personally reviewed following labs and imaging studies  CBC: Recent Labs  Lab 10/16/23 1342 10/17/23 0328 10/18/23 0418  WBC 10.1 11.7* 11.9*  NEUTROABS 9.0*  --   --   HGB 13.1 11.9* 12.1  HCT 39.3 36.8 37.6  MCV 90.3 90.4 91.7  PLT 305 324 406*    Basic Metabolic  Panel: Recent Labs  Lab 10/16/23 1342 10/17/23 0328 10/18/23 0418  NA 138 137 140  K 3.5 3.5 4.2  CL 104 105 105  CO2 25 23 27   GLUCOSE 116* 162* 108*  BUN 13 18 23   CREATININE 0.92 0.92 0.96  CALCIUM 8.7* 8.4* 8.5*  MG  --  2.1 2.1    CBG: No results for input(s): "GLUCAP" in the last 168 hours.  Recent Results (from the past 240 hours)  Resp panel by RT-PCR (RSV, Flu A&B, Covid) Anterior Nasal Swab     Status: Abnormal   Collection  Time: 10/09/23  5:07 PM   Specimen: Anterior Nasal Swab  Result Value Ref Range Status   SARS Coronavirus 2 by RT PCR NEGATIVE NEGATIVE Final    Comment: (NOTE) SARS-CoV-2 target nucleic acids are NOT DETECTED.  The SARS-CoV-2 RNA is generally detectable in upper respiratory specimens during the acute phase of infection. The lowest concentration of SARS-CoV-2 viral copies this assay can detect is 138 copies/mL. A negative result does not preclude SARS-Cov-2 infection and should not be used as the sole basis for treatment or other patient management decisions. A negative result may occur with  improper specimen collection/handling, submission of specimen other than nasopharyngeal swab, presence of viral mutation(s) within the areas targeted by this assay, and inadequate number of viral copies(<138 copies/mL). A negative result must be combined with clinical observations, patient history, and epidemiological information. The expected result is Negative.  Fact Sheet for Patients:  BloggerCourse.com  Fact Sheet for Healthcare Providers:  SeriousBroker.it  This test is no t yet approved or cleared by the [place] FDA and  has been authorized for detection and/or diagnosis of SARS-CoV-2 by FDA under an Emergency Use Authorization (EUA). This EUA will remain  in effect (meaning this test can be used) for the duration of the COVID-19 declaration under Section 564(b)(1) of the Act, 21 U.S.C.section 360bbb-3(b)(1), unless the authorization is terminated  or revoked sooner.       Influenza A by PCR POSITIVE (A) NEGATIVE Final   Influenza B by PCR NEGATIVE NEGATIVE Final    Comment: (NOTE) The Xpert Xpress SARS-CoV-2/FLU/RSV plus assay is intended as an aid in the diagnosis of influenza from Nasopharyngeal swab specimens and should not be used as a sole basis for treatment. Nasal washings and aspirates are unacceptable for Xpert  Xpress SARS-CoV-2/FLU/RSV testing.  Fact Sheet for Patients: BloggerCourse.com  Fact Sheet for Healthcare Providers: SeriousBroker.it  This test is not yet approved or cleared by the [place] FDA and has been authorized for detection and/or diagnosis of SARS-CoV-2 by FDA under an Emergency Use Authorization (EUA). This EUA will remain in effect (meaning this test can be used) for the duration of the COVID-19 declaration under Section 564(b)(1) of the Act, 21 U.S.C. section 360bbb-3(b)(1), unless the authorization is terminated or revoked.     Resp Syncytial Virus by PCR NEGATIVE NEGATIVE Final    Comment: (NOTE) Fact Sheet for Patients: BloggerCourse.com  Fact Sheet for Healthcare Providers: SeriousBroker.it  This test is not yet approved or cleared by the [place] FDA and has been authorized for detection and/or diagnosis of SARS-CoV-2 by FDA under an Emergency Use Authorization (EUA). This EUA will remain in effect (meaning this test can be used) for the duration of the COVID-19 declaration under Section 564(b)(1) of the Act, 21 U.S.C. section 360bbb-3(b)(1), unless the authorization is terminated or revoked.  Performed at [facility], [address], [place], [place] [address]  Blood culture (routine single)     Status: None (Preliminary result)   Collection Time: 10/16/23  1:42 PM   Specimen: BLOOD RIGHT HAND  Result Value Ref Range Status   Specimen Description   Final    BLOOD RIGHT HAND BOTTLES DRAWN AEROBIC AND ANAEROBIC   Special Requests   Final    Blood Culture results may not be optimal due to an inadequate volume of blood received in culture bottles   Culture   Final    NO GROWTH 2 DAYS Performed at [facility], [address] [place], [place] [address]    Report Status PENDING  Incomplete     Radiology Studies: No results found.  Scheduled  Meds:  dextromethorphan-guaiFENesin  1 tablet Oral BID   enoxaparin (LOVENOX) injection  40 mg Subcutaneous Q24H   FLUoxetine  40 mg Oral Daily   methocarbamol  500 mg Oral QID   methylPREDNISolone (SOLU-MEDROL) injection  40 mg Intravenous Q12H   pantoprazole  40 mg Oral Daily   risperiDONE  3 mg Oral QHS   traZODone  300 mg Oral QHS   Continuous Infusions:   LOS: 1 day   Time spent: 55 mins  Cherokee Clowers Laural Benes, MD How to contact the [facility] Attending or Consulting provider 7A - 7P or covering provider during after hours 7P -7A, for this patient?  Check the care team in [facility] and look for a) attending/consulting TR[facility] provider listed and b) the [facility] team listed Log into [email or web address] to find provider on call.  Locate the [facility] provider you are looking for under [facility] and page to a number that you can be directly reached. If you still have difficulty reaching the provider, please page the [facility] (Director on Call) for the Hospitalists listed on amion for assistance.  10/18/2023, 3:24 PM

## 2023-10-18 NOTE — Progress Notes (Signed)
Mobility Specialist Progress Note:    10/18/23 1055  Mobility  Activity Transferred from bed to chair  Level of Assistance Contact guard assist, steadying assist  Assistive Device None  Distance Ambulated (ft) 4 ft  Range of Motion/Exercises Active;All extremities  Activity Response Tolerated well  Mobility Referral Yes  Mobility visit 1 Mobility  Mobility Specialist Start Time (ACUTE ONLY) 1045  Mobility Specialist Stop Time (ACUTE ONLY) 1055  Mobility Specialist Time Calculation (min) (ACUTE ONLY) 10 min   Pt received in bed, agreeable to mobility. Required CGA to stand and ambulate with no AD. Tolerated well, asx throughout. Left pt in chair, call bell in reach. All needs met.   Lawerance Bach Mobility Specialist Please contact via Special educational needs teacher or  Rehab office at [phone]

## 2023-10-18 NOTE — Plan of Care (Signed)

## 2023-10-18 NOTE — Hospital Course (Signed)
68 y.o. female with medical history significant for COPD without chronic hypoxemia, GERD, and schizoaffective disorder who presented to the ED with worsening shortness of breath as well as cough and congestion since diagnosis of flu on 2/3 and was sent home with some Tessalon Perles after breathing treatment.  She has had some low-grade fevers and chills as well as worsening dyspnea for which she presented to the urgent care.  She has sick family members as well and was instructed to come to the ED due to noted hypoxemia with oxygen saturation in the high 80th percentile.  Patient was admitted with acute hypoxemic respiratory failure secondary to COPD exacerbation in the setting of influenza A.

## 2023-10-19 ENCOUNTER — Other Ambulatory Visit: Payer: 59

## 2023-10-19 ENCOUNTER — Telehealth: Payer: Self-pay | Admitting: *Deleted

## 2023-10-19 DIAGNOSIS — K219 Gastro-esophageal reflux disease without esophagitis: Secondary | ICD-10-CM | POA: Diagnosis not present

## 2023-10-19 DIAGNOSIS — F250 Schizoaffective disorder, bipolar type: Secondary | ICD-10-CM | POA: Diagnosis not present

## 2023-10-19 DIAGNOSIS — R001 Bradycardia, unspecified: Secondary | ICD-10-CM

## 2023-10-19 DIAGNOSIS — J9601 Acute respiratory failure with hypoxia: Secondary | ICD-10-CM | POA: Diagnosis not present

## 2023-10-19 MED ORDER — GUAIFENESIN ER 600 MG PO TB12
1200.0000 mg | ORAL_TABLET | Freq: Two times a day (BID) | ORAL | Status: DC
  Administered 2023-10-19 – 2023-10-23 (×8): 1200 mg via ORAL
  Filled 2023-10-19 (×8): qty 2

## 2023-10-19 MED ORDER — HYDROCOD POLI-CHLORPHE POLI ER 10-8 MG/5ML PO SUER
5.0000 mL | Freq: Two times a day (BID) | ORAL | Status: DC | PRN
  Administered 2023-10-19: 5 mL via ORAL
  Filled 2023-10-19: qty 5

## 2023-10-19 MED ORDER — ATROPINE SULFATE 1 MG/ML IV SOLN
0.5000 mg | Freq: Once | INTRAVENOUS | Status: AC
  Administered 2023-10-19: 0.5 mg via INTRAVENOUS
  Filled 2023-10-19: qty 1

## 2023-10-19 MED ORDER — IPRATROPIUM-ALBUTEROL 0.5-2.5 (3) MG/3ML IN SOLN
3.0000 mL | RESPIRATORY_TRACT | Status: DC
  Administered 2023-10-19 – 2023-10-21 (×14): 3 mL via RESPIRATORY_TRACT
  Filled 2023-10-19 (×14): qty 3

## 2023-10-19 MED ORDER — METHYLPREDNISOLONE SODIUM SUCC 125 MG IJ SOLR
60.0000 mg | Freq: Two times a day (BID) | INTRAMUSCULAR | Status: DC
  Administered 2023-10-20 – 2023-10-22 (×6): 60 mg via INTRAVENOUS
  Filled 2023-10-19 (×7): qty 2

## 2023-10-19 NOTE — Telephone Encounter (Signed)
Request for 14 day Zio monitor for bradycardia.

## 2023-10-19 NOTE — Progress Notes (Signed)
Patient HR has been sustaining at late 30s to low 40s,ranging from 37-43, patient sleeping sound, other V/S WDL,  Sinus Huston Foley with first degree heart block, MD made aware, Atropine ordered and given.

## 2023-10-19 NOTE — Progress Notes (Signed)
PROGRESS NOTE   [name]  VHQ:[id number] DOB: [birth year] DOA: 10/16/2023 PCP: Marylynn Pearson, FNP   Chief Complaint  Patient presents with   Shortness of Breath   Level of care: Telemetry  Brief Admission History:  68 y.o. female with medical history significant for COPD without chronic hypoxemia, GERD, and schizoaffective disorder who presented to the ED with worsening shortness of breath as well as cough and congestion since diagnosis of flu on 2/3 and was sent home with some Tessalon Perles after breathing treatment.  She has had some low-grade fevers and chills as well as worsening dyspnea for which she presented to the urgent care.  She has sick family members as well and was instructed to come to the ED due to noted hypoxemia with oxygen saturation in the high 80th percentile.  Patient was admitted with acute hypoxemic respiratory failure secondary to COPD exacerbation in the setting of influenza A.    Assessment and Plan:  Acute hypoxemic respiratory failure secondary to COPD exacerbation in the setting of influenza A -pt has been slow to recover  -Wean oxygen to baseline room air as tolerated, but may likely require some oxygen supplementation on discharge as she still remains on 4L/min supplemental oxygen.  -Procalcitonin low, no concern for bacterial pneumonia, discontinued antibiotics 2/11 -continue IV Solu-Medrol 2/11 and continue to work on weaning oxygen requirements -Breathing treatments as needed -No indication for Tamiflu at this time given diagnosis on 2/3 -continues to have respiratory distress, increased solumedrol to 60 mg, scheduled duoneb every 4 hours, added stronger cough suppressants   Schizoaffective disorder -Continue home medications   Bradycardia -spoke with cardio team and plan is for 2 week event monitor and outpatient follow up  -plan for outpatient referral for sleep study to rule out OSA   GERD -PPI   Obesity class II -BMI 36.22  DVT  prophylaxis: enoxaparin Code Status: Full  Family Communication:  Disposition: Status is: Inpatient   Consultants:   Procedures:   Antimicrobials:    Subjective: Continues to have severe cough and SOB and was bradycardic overnight receiving dose of atropine.   Objective: Vitals:   10/18/23 0501 10/18/23 2049 10/19/23 0401 10/19/23 1426  BP: (!) 87/53 131/67 (!) 131/49 104/69  Pulse: (!) 58 (!) 42 (!) 51 (!) 55  Resp: 19     Temp: 97.9 F (36.6 C) 98.9 F (37.2 C) 98 F (36.7 C) 98.2 F (36.8 C)  TempSrc: Oral Oral Oral   SpO2: 94% 97% 96% 95%  Weight:      Height:        Intake/Output Summary (Last 24 hours) at 10/19/2023 1445 Last data filed at 10/19/2023 1300 Gross per 24 hour  Intake 600 ml  Output 400 ml  Net 200 ml   Filed Weights   10/16/23 1120  Weight: 95.7 kg   Examination:  General exam: Appears calm and Uncomfortable, very hard of hearing.  Respiratory system: moderate increased work of breathing. diffuse expiratory wheezing bilateral with rales heard as well. Loud nonproductive cough frequently heard.  Cardiovascular system: normal S1 & S2 heard. No JVD, murmurs, rubs, gallops or clicks. No pedal edema. Gastrointestinal system: Abdomen is nondistended, soft and nontender. No organomegaly or masses felt. Normal bowel sounds heard. Central nervous system: Alert and oriented. No focal neurological deficits. Extremities: Symmetric 5 x 5 power. Skin: No rashes, lesions or ulcers. Psychiatry: Judgement and insight appear normal. Mood & affect appropriate.   Data Reviewed: I have personally reviewed following labs  and imaging studies  CBC: Recent Labs  Lab 10/16/23 1342 10/17/23 0328 10/18/23 0418  WBC 10.1 11.7* 11.9*  NEUTROABS 9.0*  --   --   HGB 13.1 11.9* 12.1  HCT 39.3 36.8 37.6  MCV 90.3 90.4 91.7  PLT 305 324 406*    Basic Metabolic Panel: Recent Labs  Lab 10/16/23 1342 10/17/23 0328 10/18/23 0418  NA 138 137 140  K 3.5 3.5 4.2   CL 104 105 105  CO2 25 23 27   GLUCOSE 116* 162* 108*  BUN 13 18 23   CREATININE 0.92 0.92 0.96  CALCIUM 8.7* 8.4* 8.5*  MG  --  2.1 2.1    CBG: No results for input(s): "GLUCAP" in the last 168 hours.  Recent Results (from the past 240 hours)  Resp panel by RT-PCR (RSV, Flu A&B, Covid) Anterior Nasal Swab     Status: Abnormal   Collection Time: 10/09/23  5:07 PM   Specimen: Anterior Nasal Swab  Result Value Ref Range Status   SARS Coronavirus 2 by RT PCR NEGATIVE NEGATIVE Final    Comment: (NOTE) SARS-CoV-2 target nucleic acids are NOT DETECTED.  The SARS-CoV-2 RNA is generally detectable in upper respiratory specimens during the acute phase of infection. The lowest concentration of SARS-CoV-2 viral copies this assay can detect is 138 copies/mL. A negative result does not preclude SARS-Cov-2 infection and should not be used as the sole basis for treatment or other patient management decisions. A negative result may occur with  improper specimen collection/handling, submission of specimen other than nasopharyngeal swab, presence of viral mutation(s) within the areas targeted by this assay, and inadequate number of viral copies(<138 copies/mL). A negative result must be combined with clinical observations, patient history, and epidemiological information. The expected result is Negative.  Fact Sheet for Patients:  BloggerCourse.com  Fact Sheet for Healthcare Providers:  SeriousBroker.it  This test is no t yet approved or cleared by the [place] FDA and  has been authorized for detection and/or diagnosis of SARS-CoV-2 by FDA under an Emergency Use Authorization (EUA). This EUA will remain  in effect (meaning this test can be used) for the duration of the COVID-19 declaration under Section 564(b)(1) of the Act, 21 U.S.C.section 360bbb-3(b)(1), unless the authorization is terminated  or revoked sooner.       Influenza  A by PCR POSITIVE (A) NEGATIVE Final   Influenza B by PCR NEGATIVE NEGATIVE Final    Comment: (NOTE) The Xpert Xpress SARS-CoV-2/FLU/RSV plus assay is intended as an aid in the diagnosis of influenza from Nasopharyngeal swab specimens and should not be used as a sole basis for treatment. Nasal washings and aspirates are unacceptable for Xpert Xpress SARS-CoV-2/FLU/RSV testing.  Fact Sheet for Patients: BloggerCourse.com  Fact Sheet for Healthcare Providers: SeriousBroker.it  This test is not yet approved or cleared by the [place] FDA and has been authorized for detection and/or diagnosis of SARS-CoV-2 by FDA under an Emergency Use Authorization (EUA). This EUA will remain in effect (meaning this test can be used) for the duration of the COVID-19 declaration under Section 564(b)(1) of the Act, 21 U.S.C. section 360bbb-3(b)(1), unless the authorization is terminated or revoked.     Resp Syncytial Virus by PCR NEGATIVE NEGATIVE Final    Comment: (NOTE) Fact Sheet for Patients: BloggerCourse.com  Fact Sheet for Healthcare Providers: SeriousBroker.it  This test is not yet approved or cleared by the [place] FDA and has been authorized for detection and/or diagnosis of SARS-CoV-2 by FDA under  an Emergency Use Authorization (EUA). This EUA will remain in effect (meaning this test can be used) for the duration of the COVID-19 declaration under Section 564(b)(1) of the Act, 21 U.S.C. section 360bbb-3(b)(1), unless the authorization is terminated or revoked.  Performed at [facility], [address] [place], [place] [address]   Blood culture (routine single)     Status: None (Preliminary result)   Collection Time: 10/16/23  1:42 PM   Specimen: BLOOD RIGHT HAND  Result Value Ref Range Status   Specimen Description   Final    BLOOD RIGHT HAND BOTTLES DRAWN AEROBIC AND  ANAEROBIC   Special Requests   Final    Blood Culture results may not be optimal due to an inadequate volume of blood received in culture bottles   Culture   Final    NO GROWTH 3 DAYS Performed at [facility], [address], [place], [place] [address]    Report Status PENDING  Incomplete     Radiology Studies: No results found.  Scheduled Meds:  enoxaparin (LOVENOX) injection  40 mg Subcutaneous Q24H   FLUoxetine  40 mg Oral Daily   guaiFENesin  1,200 mg Oral BID   ipratropium-albuterol  3 mL Nebulization Q4H   methocarbamol  500 mg Oral QID   [START ON 10/20/2023] methylPREDNISolone (SOLU-MEDROL) injection  60 mg Intravenous Q12H   pantoprazole  40 mg Oral Daily   risperiDONE  3 mg Oral QHS   traZODone  300 mg Oral QHS   Continuous Infusions:   LOS: 2 days   Time spent: 57 mins  Kiyan Burmester Laural Benes, MD How to contact the [facility] Attending or Consulting provider 7A - 7P or covering provider during after hours 7P -7A, for this patient?  Check the care team in [facility] and look for a) attending/consulting TRH provider listed and b) the [facility] team listed Log into [email or web address] to find provider on call.  Locate the [facility] provider you are looking for under [facility] and page to a number that you can be directly reached. If you still have difficulty reaching the provider, please page the [facility] (Director on Call) for the Hospitalists listed on amion for assistance.  10/19/2023, 2:45 PM

## 2023-10-19 NOTE — Progress Notes (Signed)
   Cardiology was initially consulted for nocturnal bradycardia. The patient was asymptomatic and sleeping at those times and by review of telemetry, she was in sinus bradycardia with heart rate in the high 30's to low 40's. Improved this morning in the 60's to 90's. Dr. Jenene Slicker also reviewed telemetry as well and recommended the patient have a 2-week monitor and follow-up with Cardiology once this has resulted. Would benefit from a sleep study as an outpatient as well. A message has been sent to the office to arrange for her monitor and follow-up has been arranged (New Patient appt scheduled since we did not formally see her this admission).   Signed, Ellsworth Lennox, PA-C 10/19/2023, 11:00 AM

## 2023-10-19 NOTE — Plan of Care (Signed)
Problem: Education: Goal: Knowledge of General Education information will improve Description Including pain rating scale, medication(s)/side effects and non-pharmacologic comfort measures Outcome: Progressing   Problem: Health Behavior/Discharge Planning: Goal: Ability to manage health-related needs will improve Outcome: Progressing

## 2023-10-20 DIAGNOSIS — K219 Gastro-esophageal reflux disease without esophagitis: Secondary | ICD-10-CM | POA: Diagnosis not present

## 2023-10-20 DIAGNOSIS — F250 Schizoaffective disorder, bipolar type: Secondary | ICD-10-CM | POA: Diagnosis not present

## 2023-10-20 DIAGNOSIS — J9601 Acute respiratory failure with hypoxia: Secondary | ICD-10-CM | POA: Diagnosis not present

## 2023-10-20 NOTE — Evaluation (Signed)
Physical Therapy Evaluation Patient Details Name: [name] MRN: [id number] DOB: 20-Feb-1956 Today's Date: 10/20/2023  History of Present Illness  [name] is a 68 y.o. female with medical history significant for COPD without chronic hypoxemia, GERD, and schizoaffective disorder who presented to the ED with worsening shortness of breath as well as cough and congestion since diagnosis of flu on 2/3 and was sent home with some Tessalon Perles after breathing treatment.  She has had some low-grade fevers and chills as well as worsening dyspnea for which she presented to the urgent care.  She has sick family members as well and was instructed to come to the ED due to noted hypoxemia with oxygen saturation in the high 80th percentile.   Clinical Impression  Patient was agreeable to therapy. Patient was CTG/min assist for all tasks assessed. Patient was able to ambulate a little but then became dizzy. Ambulation was discontinued and BP was taken again. SpO2 and BP was monitored during session. Prior to ambulation SpO2 was at 98% and BP was 111/66 taken standing. Following ambulation SpO2 was 88% and BP was 96/62. Patient was placed on 2L of O2 and was left sitting in chair at conclusion of session. Nurse was informed of vitals information and O2 level at conclusion of session. Patient will benefit from continued skilled physical therapy in hospital and recommended venue below to increase strength, balance, endurance for safe ADLs and gait.        If plan is discharge home, recommend the following: A little help with walking and/or transfers;A little help with bathing/dressing/bathroom;Assistance with cooking/housework;Help with stairs or ramp for entrance   Can travel by private vehicle        Equipment Recommendations None recommended by PT  Recommendations for Other Services       Functional Status Assessment Patient has had a recent decline in their functional status and  demonstrates the ability to make significant improvements in function in a reasonable and predictable amount of time.     Precautions / Restrictions Precautions Precautions: Fall Restrictions Weight Bearing Restrictions Per Provider Order: No      Mobility  Bed Mobility Overal bed mobility: Needs Assistance Bed Mobility: Supine to Sit     Supine to sit: Contact guard, Min assist       Patient Response: Cooperative  Transfers Overall transfer level: Needs assistance   Transfers: Sit to/from Stand, Bed to chair/wheelchair/BSC Sit to Stand: Contact guard assist, Min assist   Step pivot transfers: Contact guard assist, Min assist            Ambulation/Gait Ambulation/Gait assistance: Contact guard assist, Min assist Gait Distance (Feet): 5 Feet   Gait Pattern/deviations: Step-to pattern, Decreased step length - right, Decreased step length - left Gait velocity: slow     General Gait Details: slow, labored movements. limited due to dizziness and BP dropping  Stairs            Wheelchair Mobility     Tilt Bed Tilt Bed Patient Response: Cooperative  Modified Rankin (Stroke Patients Only)       Balance Overall balance assessment: Needs assistance Sitting-balance support: Bilateral upper extremity supported, Feet supported Sitting balance-Leahy Scale: Fair Sitting balance - Comments: fair/good   Standing balance support: No upper extremity supported Standing balance-Leahy Scale: Fair Standing balance comment: fair/good  Pertinent Vitals/Pain Pain Assessment Pain Assessment: No/denies pain    Home Living Family/patient expects to be discharged to:: Private residence Living Arrangements: Other (Comment) Available Help at Discharge: Family Type of Home: House Home Access: Stairs to enter Entrance Stairs-Rails: None Entrance Stairs-Number of Steps: 2   Home Layout: One level Home Equipment: None       Prior Function Prior Level of Function : Independent/Modified Independent                     Extremity/Trunk Assessment   Upper Extremity Assessment Upper Extremity Assessment: Generalized weakness    Lower Extremity Assessment Lower Extremity Assessment: Generalized weakness    Cervical / Trunk Assessment Cervical / Trunk Assessment: Normal  Communication   Communication Communication: No apparent difficulties    Cognition Arousal: Alert Behavior During Therapy: WFL for tasks assessed/performed   PT - Cognitive impairments: No apparent impairments                         Following commands: Intact       Cueing Cueing Techniques: Verbal cues, Tactile cues     General Comments      Exercises     Assessment/Plan    PT Assessment Patient needs continued PT services  PT Problem List Decreased mobility;Decreased activity tolerance;Decreased strength;Decreased balance       PT Treatment Interventions DME instruction;Gait training;Patient/family education;Stair training;Functional mobility training;Therapeutic activities;Therapeutic exercise;Balance training    PT Goals (Current goals can be found in the Care Plan section)  Acute Rehab PT Goals Patient Stated Goal: to return home PT Goal Formulation: With patient Time For Goal Achievement: 11/03/23 Potential to Achieve Goals: Good    Frequency Min 3X/week     Co-evaluation               AM-PAC PT "6 Clicks" Mobility  Outcome Measure Help needed turning from your back to your side while in a flat bed without using bedrails?: A Little Help needed moving from lying on your back to sitting on the side of a flat bed without using bedrails?: A Little Help needed moving to and from a bed to a chair (including a wheelchair)?: A Little Help needed standing up from a chair using your arms (e.g., wheelchair or bedside chair)?: A Little Help needed to walk in hospital room?: A Little Help  needed climbing 3-5 steps with a railing? : A Lot 6 Click Score: 17    End of Session   Activity Tolerance: Patient tolerated treatment well;Patient limited by fatigue Patient left: in chair;with call bell/phone within reach Nurse Communication: Mobility status PT Visit Diagnosis: Unsteadiness on feet (R26.81);Other abnormalities of gait and mobility (R26.89);Muscle weakness (generalized) (M62.81)    Time: 5621-3086 PT Time Calculation (min) (ACUTE ONLY): 29 min   Charges:   PT Evaluation $PT Eval Moderate Complexity: 1 Mod PT Treatments $Therapeutic Activity: 23-37 mins PT General Charges $$ ACUTE PT VISIT: 1 Visit         Juris Gosnell SPT

## 2023-10-20 NOTE — Progress Notes (Signed)
PROGRESS NOTE   [name]  ZOX:[id number] DOB: [birth year] DOA: 10/16/2023 PCP: Marylynn Pearson, FNP   Chief Complaint  Patient presents with   Shortness of Breath   Level of care: Med-Surg  Brief Admission History:  68 y.o. female with medical history significant for COPD without chronic hypoxemia, GERD, and schizoaffective disorder who presented to the ED with worsening shortness of breath as well as cough and congestion since diagnosis of flu on 2/3 and was sent home with some Tessalon Perles after breathing treatment.  She has had some low-grade fevers and chills as well as worsening dyspnea for which she presented to the urgent care.  She has sick family members as well and was instructed to come to the ED due to noted hypoxemia with oxygen saturation in the high 80th percentile.  Patient was admitted with acute hypoxemic respiratory failure secondary to COPD exacerbation in the setting of influenza A.    Assessment and Plan:  Acute hypoxemic respiratory failure secondary to COPD exacerbation in the setting of influenza A -pt has been slow to recover  -Wean oxygen to baseline room air as tolerated, but may likely require some oxygen supplementation on discharge as she still remains on 4L/min supplemental oxygen.  -Procalcitonin low, no concern for bacterial pneumonia, discontinued antibiotics 2/11 -continue IV Solu-Medrol 2/11 and continue to work on weaning oxygen requirements -Breathing treatments as needed -No indication for Tamiflu at this time given diagnosis on 2/3 -continues to have respiratory distress but slowly improving today, continue solumedrol to 60 mg BID, scheduled duoneb every 4 hours, added stronger cough suppressants -start ambulating today ... PT eval requested    Schizoaffective disorder -Continue home medications   Bradycardia -spoke with cardio team and plan is for 2 week event monitor and outpatient follow up  -plan for outpatient referral for sleep  study to rule out OSA   GERD -PPI   Obesity class II -BMI 36.22  DVT prophylaxis: enoxaparin Code Status: Full  Family Communication:  Disposition: Status is: Inpatient   Consultants:   Procedures:   Antimicrobials:    Subjective: Pt reports SOB is starting to get better today.  She is willing to try to start ambulating today.    Objective: Vitals:   10/20/23 0100 10/20/23 0501 10/20/23 0507 10/20/23 0805  BP:  129/69    Pulse:  (!) 51    Resp:  18    Temp:  97.8 F (36.6 C)    TempSrc:  Oral    SpO2: 98% 98% 94% 95%  Weight:      Height:        Intake/Output Summary (Last 24 hours) at 10/20/2023 1018 Last data filed at 10/20/2023 0900 Gross per 24 hour  Intake 600 ml  Output 130 ml  Net 470 ml   Filed Weights   10/16/23 1120  Weight: 95.7 kg   Examination:  General exam: Appears calm and Uncomfortable, very hard of hearing.  Respiratory system: moderate increased work of breathing. diffuse expiratory wheezing bilateral with rales heard as well. Loud nonproductive cough frequently heard.  Cardiovascular system: normal S1 & S2 heard. No JVD, murmurs, rubs, gallops or clicks. No pedal edema. Gastrointestinal system: Abdomen is nondistended, soft and nontender. No organomegaly or masses felt. Normal bowel sounds heard. Central nervous system: Alert and oriented. No focal neurological deficits. Extremities: Symmetric 5 x 5 power. Skin: No rashes, lesions or ulcers. Psychiatry: Judgement and insight appear normal. Mood & affect appropriate.   Data Reviewed: I have  personally reviewed following labs and imaging studies  CBC: Recent Labs  Lab 10/16/23 1342 10/17/23 0328 10/18/23 0418  WBC 10.1 11.7* 11.9*  NEUTROABS 9.0*  --   --   HGB 13.1 11.9* 12.1  HCT 39.3 36.8 37.6  MCV 90.3 90.4 91.7  PLT 305 324 406*    Basic Metabolic Panel: Recent Labs  Lab 10/16/23 1342 10/17/23 0328 10/18/23 0418  NA 138 137 140  K 3.5 3.5 4.2  CL 104 105 105  CO2  25 23 27   GLUCOSE 116* 162* 108*  BUN 13 18 23   CREATININE 0.92 0.92 0.96  CALCIUM 8.7* 8.4* 8.5*  MG  --  2.1 2.1    CBG: No results for input(s): "GLUCAP" in the last 168 hours.  Recent Results (from the past 240 hours)  Blood culture (routine single)     Status: None (Preliminary result)   Collection Time: 10/16/23  1:42 PM   Specimen: BLOOD RIGHT HAND  Result Value Ref Range Status   Specimen Description   Final    BLOOD RIGHT HAND BOTTLES DRAWN AEROBIC AND ANAEROBIC   Special Requests   Final    Blood Culture results may not be optimal due to an inadequate volume of blood received in culture bottles   Culture   Final    NO GROWTH 4 DAYS Performed at [facility], [address], [place], [place] [address]    Report Status PENDING  Incomplete     Radiology Studies: No results found.  Scheduled Meds:  enoxaparin (LOVENOX) injection  40 mg Subcutaneous Q24H   FLUoxetine  40 mg Oral Daily   guaiFENesin  1,200 mg Oral BID   ipratropium-albuterol  3 mL Nebulization Q4H   methocarbamol  500 mg Oral QID   methylPREDNISolone (SOLU-MEDROL) injection  60 mg Intravenous Q12H   pantoprazole  40 mg Oral Daily   risperiDONE  3 mg Oral QHS   traZODone  300 mg Oral QHS   Continuous Infusions:   LOS: 3 days   Time spent: 55 mins  Ayrianna Mcginniss Laural Benes, MD How to contact the [facility] Attending or Consulting provider 7A - 7P or covering provider during after hours 7P -7A, for this patient?  Check the care team in [facility] and look for a) attending/consulting TRH provider listed and b) the [facility] team listed Log into [email or web address] to find provider on call.  Locate the [facility] provider you are looking for under [facility] and page to a number that you can be directly reached. If you still have difficulty reaching the provider, please page the [facility] (Director on Call) for the Hospitalists listed on amion for assistance.  10/20/2023, 10:18 AM

## 2023-10-20 NOTE — Plan of Care (Signed)
  Problem: Acute Rehab PT Goals(only PT should resolve) Goal: Pt Will Go Supine/Side To Sit Outcome: Progressing Flowsheets (Taken 10/20/2023 1357) Pt will go Supine/Side to Sit:  with supervision  with contact guard assist Goal: Patient Will Transfer Sit To/From Stand Outcome: Progressing Flowsheets (Taken 10/20/2023 1357) Patient will transfer sit to/from stand:  with supervision  with contact guard assist Goal: Pt Will Transfer Bed To Chair/Chair To Bed Outcome: Progressing Flowsheets (Taken 10/20/2023 1357) Pt will Transfer Bed to Chair/Chair to Bed:  with supervision  with contact guard assist Goal: Pt Will Ambulate Outcome: Progressing Flowsheets (Taken 10/20/2023 1357) Pt will Ambulate:  15 feet  with supervision  with contact guard assist    [name] SPT

## 2023-10-21 DIAGNOSIS — F250 Schizoaffective disorder, bipolar type: Secondary | ICD-10-CM | POA: Diagnosis not present

## 2023-10-21 DIAGNOSIS — J9601 Acute respiratory failure with hypoxia: Secondary | ICD-10-CM | POA: Diagnosis not present

## 2023-10-21 DIAGNOSIS — K219 Gastro-esophageal reflux disease without esophagitis: Secondary | ICD-10-CM | POA: Diagnosis not present

## 2023-10-21 LAB — CULTURE, BLOOD (SINGLE): Culture: NO GROWTH

## 2023-10-21 MED ORDER — OXYCODONE HCL 5 MG PO TABS
5.0000 mg | ORAL_TABLET | Freq: Once | ORAL | Status: AC | PRN
  Administered 2023-10-21: 5 mg via ORAL
  Filled 2023-10-21: qty 1

## 2023-10-21 MED ORDER — IPRATROPIUM-ALBUTEROL 0.5-2.5 (3) MG/3ML IN SOLN
3.0000 mL | RESPIRATORY_TRACT | Status: DC
  Administered 2023-10-21 – 2023-10-22 (×2): 3 mL via RESPIRATORY_TRACT
  Filled 2023-10-21 (×2): qty 3

## 2023-10-21 NOTE — Progress Notes (Signed)
PROGRESS NOTE   [name]  NWG:[id number] DOB: 03-Oct-1955 DOA: 10/16/2023 PCP: Marylynn Pearson, FNP   Chief Complaint  Patient presents with   Shortness of Breath   Level of care: Med-Surg  Brief Admission History:  68 y.o. female with medical history significant for COPD without chronic hypoxemia, GERD, and schizoaffective disorder who presented to the ED with worsening shortness of breath as well as cough and congestion since diagnosis of flu on 2/3 and was sent home with some Tessalon Perles after breathing treatment.  She has had some low-grade fevers and chills as well as worsening dyspnea for which she presented to the urgent care.  She has sick family members as well and was instructed to come to the ED due to noted hypoxemia with oxygen saturation in the high 80th percentile.  Patient was admitted with acute hypoxemic respiratory failure secondary to COPD exacerbation in the setting of influenza A.    Assessment and Plan:  Acute hypoxemic respiratory failure secondary to COPD exacerbation in the setting of influenza A -pt has been slow to recover  -Wean oxygen to baseline room air as tolerated, but may likely require some oxygen supplementation on discharge as she still remains on 4L/min supplemental oxygen.  -Procalcitonin low, no concern for bacterial pneumonia, discontinued antibiotics 2/11 -continue IV Solu-Medrol 2/11 and continue to work on weaning oxygen requirements -now that she is starting to improve will try to wean to oral prednisone in anticipation of discharge home 2/17 -Breathing treatments scheduled for now -No indication for Tamiflu at this time given diagnosis on 2/3 -continue ambulating ... PT eval recommending Home health    Schizoaffective disorder -Continue home medications   Bradycardia -spoke with cardio team and plan is for 2 week event monitor and outpatient follow up  -plan for outpatient referral for sleep study to rule out OSA    GERD -PPI   Obesity class II -BMI 36.22  Hard of hearing - pt's son reports she's been getting more HOH over last year but would not go to doctor about it.  - I told him I would try to refer her to an audiologist at discharge for formal hearing testing  DVT prophylaxis: enoxaparin Code Status: Full  Family Communication: t/c daughter 2/15 no answer, son 2/15 Disposition: anticipate home with [facility] on 2/17    Consultants:   Procedures:   Antimicrobials:    Subjective: Having less SOB. Responding to treatments.    Objective: Vitals:   10/20/23 2036 10/20/23 2314 10/21/23 0418 10/21/23 0814  BP: 121/65  119/67   Pulse: 67  (!) 53   Resp: 18     Temp: 98.4 F (36.9 C)  98.3 F (36.8 C)   TempSrc: Oral  Oral   SpO2: 97% 95% 95% 94%  Weight:      Height:        Intake/Output Summary (Last 24 hours) at 10/21/2023 1232 Last data filed at 10/21/2023 0700 Gross per 24 hour  Intake 240 ml  Output 1000 ml  Net -760 ml   Filed Weights   10/16/23 1120  Weight: 95.7 kg   Examination:  General exam: Appears calm and comfortable, very hard of hearing.  Respiratory system: no increased WOB. Reduced and rare expiratory wheezing bilateral, no rales heard. .  Cardiovascular system: normal S1 & S2 heard. No JVD, murmurs, rubs, gallops or clicks. No pedal edema. Gastrointestinal system: Abdomen is nondistended, soft and nontender. No organomegaly or masses felt. Normal bowel sounds heard. Central nervous  system: Alert and oriented. No focal neurological deficits. Extremities: Symmetric 5 x 5 power. Skin: No rashes, lesions or ulcers. Psychiatry: Judgement and insight appear normal. Mood & affect appropriate.   Data Reviewed: I have personally reviewed following labs and imaging studies  CBC: Recent Labs  Lab 10/16/23 1342 10/17/23 0328 10/18/23 0418  WBC 10.1 11.7* 11.9*  NEUTROABS 9.0*  --   --   HGB 13.1 11.9* 12.1  HCT 39.3 36.8 37.6  MCV 90.3 90.4 91.7  PLT 305  324 406*    Basic Metabolic Panel: Recent Labs  Lab 10/16/23 1342 10/17/23 0328 10/18/23 0418  NA 138 137 140  K 3.5 3.5 4.2  CL 104 105 105  CO2 25 23 27   GLUCOSE 116* 162* 108*  BUN 13 18 23   CREATININE 0.92 0.92 0.96  CALCIUM 8.7* 8.4* 8.5*  MG  --  2.1 2.1    CBG: No results for input(s): "GLUCAP" in the last 168 hours.  Recent Results (from the past 240 hours)  Blood culture (routine single)     Status: None   Collection Time: 10/16/23  1:42 PM   Specimen: BLOOD RIGHT HAND  Result Value Ref Range Status   Specimen Description   Final    BLOOD RIGHT HAND BOTTLES DRAWN AEROBIC AND ANAEROBIC   Special Requests   Final    Blood Culture results may not be optimal due to an inadequate volume of blood received in culture bottles   Culture   Final    NO GROWTH 5 DAYS Performed at [facility], [address], [place], [place] [address]    Report Status 10/21/2023 FINAL  Final     Radiology Studies: No results found.  Scheduled Meds:  enoxaparin (LOVENOX) injection  40 mg Subcutaneous Q24H   FLUoxetine  40 mg Oral Daily   guaiFENesin  1,200 mg Oral BID   ipratropium-albuterol  3 mL Nebulization Q4H   methocarbamol  500 mg Oral QID   methylPREDNISolone (SOLU-MEDROL) injection  60 mg Intravenous Q12H   pantoprazole  40 mg Oral Daily   risperiDONE  3 mg Oral QHS   traZODone  300 mg Oral QHS   Continuous Infusions:   LOS: 4 days   Time spent: 55 mins  Hamsini Verrilli Laural Benes, MD How to contact the [facility] Attending or Consulting provider 7A - 7P or covering provider during after hours 7P -7A, for this patient?  Check the care team in [facility] and look for a) attending/consulting TRH provider listed and b) the [facility] team listed Log into [email or web address] to find provider on call.  Locate the [facility] provider you are looking for under [facility] and page to a number that you can be directly reached. If you still have difficulty reaching the provider, please page the [facility] (Director  on Call) for the Hospitalists listed on amion for assistance.  10/21/2023, 12:32 PM

## 2023-10-21 NOTE — Plan of Care (Signed)
  Problem: Clinical Measurements: Goal: Respiratory complications will improve Outcome: Progressing   Problem: Pain Managment: Goal: General experience of comfort will improve and/or be controlled Outcome: Progressing   Problem: Coping: Goal: Level of anxiety will decrease Outcome: Progressing

## 2023-10-22 DIAGNOSIS — F250 Schizoaffective disorder, bipolar type: Secondary | ICD-10-CM | POA: Diagnosis not present

## 2023-10-22 DIAGNOSIS — J9601 Acute respiratory failure with hypoxia: Secondary | ICD-10-CM | POA: Diagnosis not present

## 2023-10-22 MED ORDER — PREDNISONE 20 MG PO TABS
40.0000 mg | ORAL_TABLET | Freq: Every day | ORAL | Status: DC
  Administered 2023-10-23: 40 mg via ORAL
  Filled 2023-10-22: qty 2

## 2023-10-22 MED ORDER — IPRATROPIUM-ALBUTEROL 0.5-2.5 (3) MG/3ML IN SOLN
3.0000 mL | Freq: Three times a day (TID) | RESPIRATORY_TRACT | Status: DC
  Administered 2023-10-22 – 2023-10-23 (×3): 3 mL via RESPIRATORY_TRACT
  Filled 2023-10-22 (×3): qty 3

## 2023-10-22 NOTE — Progress Notes (Signed)
PROGRESS NOTE  MERCIE BALSLEY  NWG:[id number] DOB: 1955-09-14 DOA: 10/16/2023 PCP: Marylynn Pearson, FNP   Chief Complaint  Patient presents with   Shortness of Breath   Level of care: Med-Surg  Brief Admission History:  68 y.o. female with medical history significant for COPD without chronic hypoxemia, GERD, and schizoaffective disorder who presented to the ED with worsening shortness of breath as well as cough and congestion since diagnosis of flu on 2/3 and was sent home with some Tessalon Perles after breathing treatment.  She has had some low-grade fevers and chills as well as worsening dyspnea for which she presented to the urgent care.  She has sick family members as well and was instructed to come to the ED due to noted hypoxemia with oxygen saturation in the high 80th percentile.  Patient was admitted with acute hypoxemic respiratory failure secondary to COPD exacerbation in the setting of influenza A.    Assessment and Plan:  Acute hypoxemic respiratory failure secondary to COPD exacerbation in the setting of influenza A -pt has been slow to recover  -Wean oxygen to baseline room air as tolerated, but may likely require some oxygen supplementation on discharge as she still remains on 4L/min supplemental oxygen  -Procalcitonin low, no concern for bacterial pneumonia, discontinued antibiotics 2/11 -continue IV Solu-Medrol 2/11 and continue to work on weaning oxygen requirements -now that she is starting to improve will try to wean to oral prednisone in anticipation of discharge home 2/17 -Breathing treatments scheduled for now -No indication for Tamiflu at this time given diagnosis on 2/3 -continue ambulating ... PT eval recommending Home health    Schizoaffective disorder -Continue home medications   Bradycardia -spoke with cardio team and plan is for 2 week event monitor and outpatient follow up  -plan for outpatient referral for sleep study to rule out OSA   GERD -PPI    Obesity class II -BMI 36.22  Hard of hearing - pt's son reports she's been getting more HOH over last year but would not go to doctor about it.  - I told him I would try to refer her to an audiologist at discharge for formal hearing testing  DVT prophylaxis: enoxaparin Code Status: Full  Family Communication: t/c daughter 2/15, son 2/15 Disposition: anticipate home with [facility] on 2/17    Consultants:   Procedures:   Antimicrobials:    Subjective: She is coughing much less and starting to ambulate in room.     Objective: Vitals:   10/21/23 2202 10/21/23 2302 10/22/23 0707 10/22/23 0842  BP: (!) 90/46  130/67   Pulse: 65  69   Resp:      Temp: (!) 97.5 F (36.4 C)  98.2 F (36.8 C)   TempSrc: Oral  Oral   SpO2: 96% 96% 96% 90%  Weight:      Height:        Intake/Output Summary (Last 24 hours) at 10/22/2023 1246 Last data filed at 10/21/2023 2205 Gross per 24 hour  Intake 480 ml  Output 800 ml  Net -320 ml   Filed Weights   10/16/23 1120  Weight: 95.7 kg   Examination:  General exam: Appears calm and comfortable, very hard of hearing.  Respiratory system: no increased WOB. Reduced and rare expiratory wheezing bilateral, no rales heard. Cardiovascular system: normal S1 & S2 heard. No JVD, murmurs, rubs, gallops or clicks. No pedal edema. Gastrointestinal system: Abdomen is nondistended, soft and nontender. No organomegaly or masses felt. Normal bowel sounds heard.  Central nervous system: Alert and oriented. No focal neurological deficits. Extremities: Symmetric 5 x 5 power. Skin: No rashes, lesions or ulcers. Psychiatry: Judgement and insight appear normal. Mood & affect appropriate.   Data Reviewed: I have personally reviewed following labs and imaging studies  CBC: Recent Labs  Lab 10/16/23 1342 10/17/23 0328 10/18/23 0418  WBC 10.1 11.7* 11.9*  NEUTROABS 9.0*  --   --   HGB 13.1 11.9* 12.1  HCT 39.3 36.8 37.6  MCV 90.3 90.4 91.7  PLT 305 324 406*    Basic Metabolic Panel: Recent Labs  Lab 10/16/23 1342 10/17/23 0328 10/18/23 0418  NA 138 137 140  K 3.5 3.5 4.2  CL 104 105 105  CO2 25 23 27   GLUCOSE 116* 162* 108*  BUN 13 18 23   CREATININE 0.92 0.92 0.96  CALCIUM 8.7* 8.4* 8.5*  MG  --  2.1 2.1    CBG: No results for input(s): "GLUCAP" in the last 168 hours.  Recent Results (from the past 240 hours)  Blood culture (routine single)     Status: None   Collection Time: 10/16/23  1:42 PM   Specimen: BLOOD RIGHT HAND  Result Value Ref Range Status   Specimen Description   Final    BLOOD RIGHT HAND BOTTLES DRAWN AEROBIC AND ANAEROBIC   Special Requests   Final    Blood Culture results may not be optimal due to an inadequate volume of blood received in culture bottles   Culture   Final    NO GROWTH 5 DAYS Performed at [facility], [address], [place], [place] [address]    Report Status 10/21/2023 FINAL  Final   Radiology Studies: No results found.  Scheduled Meds:  enoxaparin (LOVENOX) injection  40 mg Subcutaneous Q24H   FLUoxetine  40 mg Oral Daily   guaiFENesin  1,200 mg Oral BID   ipratropium-albuterol  3 mL Nebulization TID   methocarbamol  500 mg Oral QID   pantoprazole  40 mg Oral Daily   [START ON 10/23/2023] predniSONE  40 mg Oral Q breakfast   risperiDONE  3 mg Oral QHS   traZODone  300 mg Oral QHS   Continuous Infusions:   LOS: 5 days   Time spent: 50 mins  Danzig Macgregor Laural Benes, MD How to contact the [facility] Attending or Consulting provider 7A - 7P or covering provider during after hours 7P -7A, for this patient?  Check the care team in [facility] and look for a) attending/consulting TRH provider listed and b) the [facility] team listed Log into [email or web address] to find provider on call.  Locate the [facility] provider you are looking for under [facility] and page to a number that you can be directly reached. If you still have difficulty reaching the provider, please page the [facility] (Director on Call) for the  Hospitalists listed on amion for assistance.  10/22/2023, 12:46 PM

## 2023-10-22 NOTE — Plan of Care (Signed)
   Problem: Education: Goal: Knowledge of General Education information will improve Description: Including pain rating scale, medication(s)/side effects and non-pharmacologic comfort measures Outcome: Progressing   Problem: Clinical Measurements: Goal: Will remain free from infection Outcome: Progressing   Problem: Activity: Goal: Risk for activity intolerance will decrease Outcome: Progressing

## 2023-10-23 DIAGNOSIS — F250 Schizoaffective disorder, bipolar type: Secondary | ICD-10-CM | POA: Diagnosis not present

## 2023-10-23 DIAGNOSIS — J9601 Acute respiratory failure with hypoxia: Secondary | ICD-10-CM | POA: Diagnosis not present

## 2023-10-23 DIAGNOSIS — K219 Gastro-esophageal reflux disease without esophagitis: Secondary | ICD-10-CM | POA: Diagnosis not present

## 2023-10-23 LAB — CREATININE, SERUM
Creatinine, Ser: 0.95 mg/dL (ref 0.44–1.00)
GFR, Estimated: >60 mL/min (ref >60)

## 2023-10-23 MED ORDER — DEXTROMETHORPHAN POLISTIREX ER 30 MG/5ML PO SUER: 30.0000 mg | Freq: Two times a day (BID) | ORAL | 0 refills | Status: DC | PRN

## 2023-10-23 MED ORDER — PREDNISONE 20 MG PO TABS: ORAL_TABLET | ORAL | 0 refills | Status: DC

## 2023-10-23 MED ORDER — DOXYCYCLINE HYCLATE 100 MG PO CAPS: 100.0000 mg | ORAL_CAPSULE | Freq: Two times a day (BID) | ORAL | 0 refills | Status: AC

## 2023-10-23 MED ORDER — GUAIFENESIN ER 600 MG PO TB12: 1200.0000 mg | ORAL_TABLET | Freq: Two times a day (BID) | ORAL | 0 refills | Status: AC

## 2023-10-23 MED ORDER — ALBUTEROL SULFATE HFA 108 (90 BASE) MCG/ACT IN AERS: 2.0000 | INHALATION_SPRAY | RESPIRATORY_TRACT | 2 refills | Status: DC | PRN

## 2023-10-23 MED ORDER — IPRATROPIUM-ALBUTEROL 0.5-2.5 (3) MG/3ML IN SOLN: 3.0000 mL | Freq: Three times a day (TID) | RESPIRATORY_TRACT | 3 refills | Status: DC

## 2023-10-23 NOTE — Discharge Instructions (Signed)
 IMPORTANT INFORMATION: PAY CLOSE ATTENTION   PHYSICIAN DISCHARGE INSTRUCTIONS  Follow with Primary care provider  Marylynn Pearson, FNP  and other consultants as instructed by your Hospitalist Physician  SEEK MEDICAL CARE OR RETURN TO EMERGENCY ROOM IF SYMPTOMS COME BACK, WORSEN OR NEW PROBLEM DEVELOPS   Please note: You were cared for by a hospitalist during your hospital stay. Every effort will be made to forward records to your primary care provider.  You can request that your primary care provider send for your hospital records if they have not received them.  Once you are discharged, your primary care physician will handle any further medical issues. Please note that NO REFILLS for any discharge medications will be authorized once you are discharged, as it is imperative that you return to your primary care physician (or establish a relationship with a primary care physician if you do not have one) for your post hospital discharge needs so that they can reassess your need for medications and monitor your lab values.  Please get a complete blood count and chemistry panel checked by your Primary MD at your next visit, and again as instructed by your Primary MD.  Get Medicines reviewed and adjusted: Please take all your medications with you for your next visit with your Primary MD  Laboratory/radiological data: Please request your Primary MD to go over all hospital tests and procedure/radiological results at the follow up, please ask your primary care provider to get all Hospital records sent to his/her office.  In some cases, they will be blood work, cultures and biopsy results pending at the time of your discharge. Please request that your primary care provider follow up on these results.  If you are diabetic, please bring your blood sugar readings with you to your follow up appointment with primary care.    Please call and make your follow up appointments as soon as possible.    Also  Note the following: If you experience worsening of your admission symptoms, develop shortness of breath, life threatening emergency, suicidal or homicidal thoughts you must seek medical attention immediately by calling 911 or calling your MD immediately  if symptoms less severe.  You must read complete instructions/literature along with all the possible adverse reactions/side effects for all the Medicines you take and that have been prescribed to you. Take any new Medicines after you have completely understood and accpet all the possible adverse reactions/side effects.   Do not drive when taking Pain medications or sleeping medications (Benzodiazepines)  Do not take more than prescribed Pain, Sleep and Anxiety Medications. It is not advisable to combine anxiety,sleep and pain medications without talking with your primary care practitioner  Special Instructions: If you have smoked or chewed Tobacco  in the last 2 yrs please stop smoking, stop any regular Alcohol  and or any Recreational drug use.  Wear Seat belts while driving.  Do not drive if taking any narcotic, mind altering or controlled substances or recreational drugs or alcohol.

## 2023-10-23 NOTE — TOC Transition Note (Signed)
Transition of Care [facility]) - Discharge Note   Patient Details  Name: [name] MRN: [id number] Date of Birth: [birth year]  [facility] CM/SW Contact:  Leitha Bleak, RN Phone Number: 10/23/2023, 11:18 AM   Clinical Narrative:   Patient discharging home requiring home oxygen. CM at the bedside to discuss CMS options. Patient is agreeable to [facility], referral sent to [facility]. RN updated. PT is recommending HHPT. Patient states she in independent, does not use any DME. She is just weak from sickness. She is not interested in Home health at this time.    Final next level of care: Home/Self Care Barriers to Discharge: Barriers Resolved  Patient Goals and CMS Choice Patient states their goals for this hospitalization and ongoing recovery are:: to go home CMS Medicare.gov Compare Post Acute Care list provided to:: Patient Choice offered to / list presented to : Patient    Discharge Placement     Patient and family notified of of transfer: 10/23/23  Discharge Plan and Services Additional resources added to the After Visit Summary for                DME Arranged: Oxygen DME Agency: Patsy Lager Date DME Agency Contacted: 10/23/23 Time DME Agency Contacted: [phone] Representative spoke with at DME Agency: Ashly     Social Drivers of Health (SDOH) Interventions SDOH Screenings   Food Insecurity: No Food Insecurity (10/16/2023)  Housing: Low Risk  (10/16/2023)  Transportation Needs: No Transportation Needs (10/16/2023)  Utilities: Not At Risk (10/16/2023)  Social Connections: Socially Isolated (10/17/2023)  Tobacco Use: High Risk (10/17/2023)    Readmission Risk Interventions    10/23/2023   11:18 AM  Readmission Risk Prevention Plan  Post Dischage Appt Complete  Medication Screening Complete  Transportation Screening Complete

## 2023-10-23 NOTE — Progress Notes (Signed)
SATURATION QUALIFICATIONS: (This note is used to comply with regulatory documentation for home oxygen)  Patient Saturations on Room Air at Rest = 92%  Patient Saturations on Room Air while Ambulating = 86%  Patient Saturations on 1 Liters of oxygen while Ambulating = 92%  Please briefly explain why patient needs home oxygen:

## 2023-10-23 NOTE — Progress Notes (Signed)
Mobility Specialist Progress Note:    10/23/23 0955  Mobility  Activity Ambulated with assistance in room  Level of Assistance Standby assist, set-up cues, supervision of patient - no hands on  Assistive Device None  Distance Ambulated (ft) 30 ft  Range of Motion/Exercises Active;All extremities  Activity Response Tolerated well  Mobility Referral Yes  Mobility visit 1 Mobility  Mobility Specialist Start Time (ACUTE ONLY) 0955  Mobility Specialist Stop Time (ACUTE ONLY) 1020  Mobility Specialist Time Calculation (min) (ACUTE ONLY) 25 min   Pt received in in bed, agreeable to mobility. Required SBA to stand and ambulate with no AD. Tolerated well, O2 sats below. Returned pt supine, all needs met.   SpO2 92% on RA at rest  SpO2 86% on RA during ambulation SpO2 92% on 1L during ambulation   Aletta Edmunds Mobility Specialist Please contact via SecureChat or  Rehab office at [phone]

## 2023-10-23 NOTE — Plan of Care (Signed)
  Problem: Clinical Measurements: Goal: Respiratory complications will improve Outcome: Progressing   Problem: Nutrition: Goal: Adequate nutrition will be maintained Outcome: Progressing   Problem: Coping: Goal: Level of anxiety will decrease Outcome: Progressing   

## 2023-10-23 NOTE — Progress Notes (Signed)
Patient discharged home with instructions given on medications and follow up visits,verbalized understanding .Prescriptions sent to Pharmacy of choice documented on AVS.IV discontinued,catheter intact. Accompanied by staff to an awaiting vehicle.

## 2023-10-23 NOTE — Discharge Summary (Signed)
Physician Discharge Summary  [name] YQM:[id number] DOB: 09/14/1955 DOA: 10/16/2023  PCP: Marylynn Pearson, FNP  Admit date: 10/16/2023 Discharge date: 10/23/2023  Admitted From:  HOME  Disposition: HOME WITH HOME HEALTH   Recommendations for Outpatient Follow-up:  Follow up with PCP in 1 weeks Ambulatory referral to audiologist regarding hearing loss made  Pt fitted for 2 week heart monitor Follow up with cardiology as scheduled 12/08/23   Home Health:  PT   Discharge Condition: STABLE   CODE STATUS: DNR  DIET: resume home diet    Brief Hospitalization Summary: Please see all hospital notes, images, labs for full details of the hospitalization. Admission provider HPI:  68 y.o. female with medical history significant for COPD without chronic hypoxemia, GERD, and schizoaffective disorder who presented to the ED with worsening shortness of breath as well as cough and congestion since diagnosis of flu on 2/3 and was sent home with some Tessalon Perles after breathing treatment.  She has had some low-grade fevers and chills as well as worsening dyspnea for which she presented to the urgent care.  She has sick family members as well and was instructed to come to the ED due to noted hypoxemia with oxygen saturation in the high 80th percentile.  Patient was admitted with acute hypoxemic respiratory failure secondary to COPD exacerbation in the setting of influenza A.   Hospital Course by problem list  Acute hypoxemic respiratory failure secondary to COPD exacerbation in the setting of influenza A -pt has been slow to recover but now much closer to baseline -tried to wean oxygen to baseline room air as tolerated, but may likely require some oxygen supplementation on discharge as she still remains on 2L/min supplemental oxygen  -Procalcitonin low, no concern for bacterial pneumonia, discontinued antibiotics 2/11 -treated with IV Solu-Medrol  -weaned to oral prednisone in anticipation of  discharge home 2/17 -Breathing treatments three times daily; pt reports she will use sister's nebulizer and to not order her a new one -No indication for Tamiflu at this time given diagnosis on 2/3 -continue ambulating ... PT eval recommending Home health which has been ordered     Schizoaffective disorder -Continue home medications   Bradycardia -spoke with cardio team and plan is for 2 week event monitor and outpatient follow up  -outpatient referral for sleep study to rule out OSA    GERD -PPI   Obesity class II -BMI 36.22   Hard of hearing - pt's son reports she's been getting more HOH over last year but would not go to doctor about it.  - I told him I would try to refer her to an audiologist at discharge for formal hearing testing - referral placed in [facility] and patient said she would go to appointment   Discharge Diagnoses:  Principal Problem:   Acute hypoxemic respiratory failure (HCC) Active Problems:   Schizoaffective disorder (HCC)   GERD (gastroesophageal reflux disease)   Discharge Instructions: Discharge Instructions     Ambulatory referral to Audiology   Complete by: As directed    Ambulatory referral to Sleep Studies   Complete by: As directed       Allergies as of 10/23/2023       Reactions   Ciprofloxacin Rash   Kiwi Extract Other (See Comments)   Diarrhea and abdominal pain        Medication List     TAKE these medications    albuterol 108 (90 Base) MCG/ACT inhaler Commonly known as: VENTOLIN HFA  Inhale 2 puffs into the lungs every 4 (four) hours as needed for shortness of breath or wheezing. What changed:  how much to take when to take this   dextromethorphan 30 MG/5ML liquid Commonly known as: Delsym Take 5 mLs (30 mg total) by mouth 2 (two) times daily as needed.   doxycycline 100 MG capsule Commonly known as: VIBRAMYCIN Take 1 capsule (100 mg total) by mouth 2 (two) times daily for 3 days.   FLUoxetine 40 MG  capsule Commonly known as: PROZAC Take 40 mg by mouth in the morning.   guaiFENesin 600 MG 12 hr tablet Commonly known as: MUCINEX Take 2 tablets (1,200 mg total) by mouth 2 (two) times daily for 3 days.   ipratropium-albuterol 0.5-2.5 (3) MG/3ML Soln Commonly known as: DUONEB Take 3 mLs by nebulization 3 (three) times daily.   methocarbamol 500 MG tablet Commonly known as: ROBAXIN Take 500 mg by mouth 4 (four) times daily.   ondansetron 4 MG tablet Commonly known as: ZOFRAN Take 1 tablet (4 mg total) by mouth every 8 (eight) hours as needed for nausea or vomiting.   pantoprazole 40 MG tablet Commonly known as: PROTONIX TAKE 1 TABLET BY MOUTH DAILY   predniSONE 20 MG tablet Commonly known as: DELTASONE Take 2 PO QAM x 5 days, 1 PO QAM x 5 days Start taking on: October 24, 2023   risperiDONE 3 MG tablet Commonly known as: RISPERDAL Take 3 mg by mouth at bedtime.   traZODone 150 MG tablet Commonly known as: DESYREL Take 300 mg by mouth at bedtime.               Durable Medical Equipment  (From admission, onward)           Start     Ordered   10/23/23 0758  For home use only DME oxygen  Once       Comments: POC for COPD  Question Answer Comment  Length of Need Lifetime   Mode or (Route) Nasal cannula   Liters per Minute 2   Frequency Continuous (stationary and portable oxygen unit needed)   Oxygen conserving device Yes   Oxygen delivery system Gas      10/23/23 0757            Follow-up Information     Mallipeddi, Vishnu P, MD Follow up.   Specialties: Cardiology, Internal Medicine Why: Cardiology Follow-up on 12/08/2023 at 10:40. Our office will arrange for a heart monitor in the interim which will be mailed to you to wear for 2 weeks. If any issues with placement, please contact our office. Contact information: [address] [place] [address] [phone]                Allergies  Allergen Reactions   Ciprofloxacin Rash    Kiwi Extract Other (See Comments)    Diarrhea and abdominal pain   Allergies as of 10/23/2023       Reactions   Ciprofloxacin Rash   Kiwi Extract Other (See Comments)   Diarrhea and abdominal pain        Medication List     TAKE these medications    albuterol 108 (90 Base) MCG/ACT inhaler Commonly known as: VENTOLIN HFA Inhale 2 puffs into the lungs every 4 (four) hours as needed for shortness of breath or wheezing. What changed:  how much to take when to take this   dextromethorphan 30 MG/5ML liquid Commonly known as: Delsym Take 5 mLs (30 mg  total) by mouth 2 (two) times daily as needed.   doxycycline 100 MG capsule Commonly known as: VIBRAMYCIN Take 1 capsule (100 mg total) by mouth 2 (two) times daily for 3 days.   FLUoxetine 40 MG capsule Commonly known as: PROZAC Take 40 mg by mouth in the morning.   guaiFENesin 600 MG 12 hr tablet Commonly known as: MUCINEX Take 2 tablets (1,200 mg total) by mouth 2 (two) times daily for 3 days.   ipratropium-albuterol 0.5-2.5 (3) MG/3ML Soln Commonly known as: DUONEB Take 3 mLs by nebulization 3 (three) times daily.   methocarbamol 500 MG tablet Commonly known as: ROBAXIN Take 500 mg by mouth 4 (four) times daily.   ondansetron 4 MG tablet Commonly known as: ZOFRAN Take 1 tablet (4 mg total) by mouth every 8 (eight) hours as needed for nausea or vomiting.   pantoprazole 40 MG tablet Commonly known as: PROTONIX TAKE 1 TABLET BY MOUTH DAILY   predniSONE 20 MG tablet Commonly known as: DELTASONE Take 2 PO QAM x 5 days, 1 PO QAM x 5 days Start taking on: October 24, 2023   risperiDONE 3 MG tablet Commonly known as: RISPERDAL Take 3 mg by mouth at bedtime.   traZODone 150 MG tablet Commonly known as: DESYREL Take 300 mg by mouth at bedtime.               Durable Medical Equipment  (From admission, onward)           Start     Ordered   10/23/23 0758  For home use only DME oxygen  Once        Comments: POC for COPD  Question Answer Comment  Length of Need Lifetime   Mode or (Route) Nasal cannula   Liters per Minute 2   Frequency Continuous (stationary and portable oxygen unit needed)   Oxygen conserving device Yes   Oxygen delivery system Gas      10/23/23 0757            Procedures/Studies: DG Chest Port 1 View Result Date: 10/16/2023 CLINICAL DATA:  Ongoing upper respiratory tract symptoms with recent flu. Cough. EXAM: PORTABLE CHEST 1 VIEW COMPARISON:  10/09/2023 and CT chest 12/13/2017. FINDINGS: Trachea is midline. Heart size stable. Thoracic aorta is calcified. Mild diffuse interstitial prominence, right greater than left. There is opacity along the cardiac apex. No definite pleural fluid. Surgical clips in the left upper quadrant. IMPRESSION: 1. Mild interstitial prominence, right greater than left, possibly due to edema. Viral or atypical pneumonia not excluded. 2. Opacity along the left heart border may be due to airspace consolidation and therefore bronchopneumonia. Followup PA and lateral chest X-ray is recommended in 3-4 weeks following trial of antibiotic therapy to ensure resolution and exclude underlying malignancy. Electronically Signed   By: Leanna Battles M.D.   On: 10/16/2023 14:56   DG Chest 2 View Result Date: 10/09/2023 CLINICAL DATA:  Shortness of breath EXAM: CHEST - 2 VIEW COMPARISON:  Chest x-ray 01/21/2022 FINDINGS: The heart size and mediastinal contours are within normal limits. Both lungs are clear. The visualized skeletal structures are unremarkable. There surgical clips in the left upper quadrant. IMPRESSION: No active cardiopulmonary disease. Electronically Signed   By: Darliss Cheney M.D.   On: 10/09/2023 18:52   MM 3D SCREENING MAMMOGRAM BILATERAL BREAST Result Date: 09/28/2023 CLINICAL DATA:  Screening. EXAM: DIGITAL SCREENING BILATERAL MAMMOGRAM WITH TOMOSYNTHESIS AND CAD TECHNIQUE: Bilateral screening digital craniocaudal and mediolateral  oblique mammograms were obtained. Bilateral  screening digital breast tomosynthesis was performed. The images were evaluated with computer-aided detection. COMPARISON:  Previous exam(s). ACR Breast Density Category a: The breasts are almost entirely fatty. FINDINGS: There are no findings suspicious for malignancy. IMPRESSION: No mammographic evidence of malignancy. A result letter of this screening mammogram will be mailed directly to the patient. RECOMMENDATION: Screening mammogram in one year. (Code:SM-B-01Y) BI-RADS CATEGORY  1: Negative. Electronically Signed   By: Frederico Hamman M.D.   On: 09/28/2023 11:46     Subjective: Breathing much better, agreeable to outpatient referral to audiology, says she will use sister's nebulizer and to not order her one.    Discharge Exam: Vitals:   10/23/23 0626 10/23/23 0826  BP: 112/67   Pulse: (!) 55   Resp: 18   Temp: 98.6 F (37 C)   SpO2: 95% 96%   Vitals:   10/22/23 1458 10/22/23 2037 10/23/23 0626 10/23/23 0826  BP:   112/67   Pulse:   (!) 55   Resp:   18   Temp:   98.6 F (37 C)   TempSrc:   Oral   SpO2: 93% 94% 95% 96%  Weight:      Height:       General: Pt is alert, awake, not in acute distress Cardiovascular: bradycardic rate; normal S1/S2 +, no rubs, no gallops Respiratory: good air movement, no increased work of breathing Abdominal: Soft, NT, ND, bowel sounds + Extremities: no edema, no cyanosis   The results of significant diagnostics from this hospitalization (including imaging, microbiology, ancillary and laboratory) are listed below for reference.     Microbiology: Recent Results (from the past 240 hours)  Blood culture (routine single)     Status: None   Collection Time: 10/16/23  1:42 PM   Specimen: BLOOD RIGHT HAND  Result Value Ref Range Status   Specimen Description   Final    BLOOD RIGHT HAND BOTTLES DRAWN AEROBIC AND ANAEROBIC   Special Requests   Final    Blood Culture results may not be optimal due to an  inadequate volume of blood received in culture bottles   Culture   Final    NO GROWTH 5 DAYS Performed at [facility], [address], [place], [place] [address]    Report Status 10/21/2023 FINAL  Final     Labs: BNP (last 3 results) Recent Labs    10/16/23 1339  BNP 170.0*   Basic Metabolic Panel: Recent Labs  Lab 10/16/23 1342 10/17/23 0328 10/18/23 0418 10/23/23 0427  NA 138 137 140  --   K 3.5 3.5 4.2  --   CL 104 105 105  --   CO2 25 23 27   --   GLUCOSE 116* 162* 108*  --   BUN 13 18 23   --   CREATININE 0.92 0.92 0.96 0.95  CALCIUM 8.7* 8.4* 8.5*  --   MG  --  2.1 2.1  --    Liver Function Tests: Recent Labs  Lab 10/16/23 1342  AST 29  ALT 17  ALKPHOS 66  BILITOT 0.8  PROT 7.0  ALBUMIN 3.1*   No results for input(s): "LIPASE", "AMYLASE" in the last 168 hours. No results for input(s): "AMMONIA" in the last 168 hours. CBC: Recent Labs  Lab 10/16/23 1342 10/17/23 0328 10/18/23 0418  WBC 10.1 11.7* 11.9*  NEUTROABS 9.0*  --   --   HGB 13.1 11.9* 12.1  HCT 39.3 36.8 37.6  MCV 90.3 90.4 91.7  PLT 305 324 406*  Cardiac Enzymes: No results for input(s): "CKTOTAL", "CKMB", "CKMBINDEX", "TROPONINI" in the last 168 hours. BNP: Invalid input(s): "POCBNP" CBG: No results for input(s): "GLUCAP" in the last 168 hours. D-Dimer No results for input(s): "DDIMER" in the last 72 hours. Hgb A1c No results for input(s): "HGBA1C" in the last 72 hours. Lipid Profile No results for input(s): "CHOL", "HDL", "LDLCALC", "TRIG", "CHOLHDL", "LDLDIRECT" in the last 72 hours. Thyroid function studies No results for input(s): "TSH", "T4TOTAL", "T3FREE", "THYROIDAB" in the last 72 hours.  Invalid input(s): "FREET3" Anemia work up No results for input(s): "VITAMINB12", "FOLATE", "FERRITIN", "TIBC", "IRON", "RETICCTPCT" in the last 72 hours. Urinalysis    Component Value Date/Time   COLORURINE AMBER (A) 10/16/2023 1414   APPEARANCEUR HAZY (A) 10/16/2023 1414    LABSPEC 1.023 10/16/2023 1414   PHURINE 5.0 10/16/2023 1414   GLUCOSEU NEGATIVE 10/16/2023 1414   HGBUR NEGATIVE 10/16/2023 1414   BILIRUBINUR NEGATIVE 10/16/2023 1414   KETONESUR 5 (A) 10/16/2023 1414   PROTEINUR 100 (A) 10/16/2023 1414   UROBILINOGEN 0.2 07/31/2010 0920   NITRITE NEGATIVE 10/16/2023 1414   LEUKOCYTESUR NEGATIVE 10/16/2023 1414   Sepsis Labs Recent Labs  Lab 10/16/23 1342 10/17/23 0328 10/18/23 0418  WBC 10.1 11.7* 11.9*   Microbiology Recent Results (from the past 240 hours)  Blood culture (routine single)     Status: None   Collection Time: 10/16/23  1:42 PM   Specimen: BLOOD RIGHT HAND  Result Value Ref Range Status   Specimen Description   Final    BLOOD RIGHT HAND BOTTLES DRAWN AEROBIC AND ANAEROBIC   Special Requests   Final    Blood Culture results may not be optimal due to an inadequate volume of blood received in culture bottles   Culture   Final    NO GROWTH 5 DAYS Performed at [facility], [address] [place], [place] [address]    Report Status 10/21/2023 FINAL  Final    Time coordinating discharge: 41 mins  SIGNED:  Standley Dakins, MD  [facility] 10/23/2023, 9:46 AM How to contact the [facility] Attending or Consulting provider 7A - 7P or covering provider during after hours 7P -7A, for this patient?  Check the care team in [facility] and look for a) attending/consulting [facility] provider listed and b) the [facility] team listed Log into [email or web address] and use [facility]'s universal password to access. If you do not have the password, please contact the hospital operator. Locate the [facility] provider you are looking for under [facility] and page to a number that you can be directly reached. If you still have difficulty reaching the provider, please page the [facility] (Director on Call) for the Hospitalists listed on amion for assistance.

## 2023-10-30 ENCOUNTER — Other Ambulatory Visit (HOSPITAL_COMMUNITY): Payer: Self-pay | Admitting: Adult Health

## 2023-10-30 DIAGNOSIS — J9601 Acute respiratory failure with hypoxia: Secondary | ICD-10-CM

## 2023-11-09 ENCOUNTER — Ambulatory Visit (HOSPITAL_COMMUNITY)
Admission: RE | Admit: 2023-11-09 | Discharge: 2023-11-09 | Disposition: A | Discharge status: Home / Self Care | Source: Ambulatory Visit | Attending: Adult Health | Admitting: Adult Health

## 2023-11-09 DIAGNOSIS — J9601 Acute respiratory failure with hypoxia: Secondary | ICD-10-CM | POA: Insufficient documentation

## 2023-12-06 ENCOUNTER — Ambulatory Visit: Payer: 59 | Admitting: Internal Medicine

## 2023-12-08 ENCOUNTER — Ambulatory Visit: Payer: 59 | Admitting: Internal Medicine

## 2023-12-14 ENCOUNTER — Telehealth: Payer: Self-pay | Admitting: *Deleted

## 2023-12-14 ENCOUNTER — Ambulatory Visit (INDEPENDENT_AMBULATORY_CARE_PROVIDER_SITE_OTHER): Payer: 59 | Admitting: Gastroenterology

## 2023-12-14 ENCOUNTER — Encounter: Payer: Self-pay | Admitting: Gastroenterology

## 2023-12-14 VITALS — BP 102/70 | HR 71 | Temp 98.3°F | Ht 64.0 in | Wt 202.4 lb

## 2023-12-14 DIAGNOSIS — K7469 Other cirrhosis of liver: Secondary | ICD-10-CM

## 2023-12-14 DIAGNOSIS — K7460 Unspecified cirrhosis of liver: Secondary | ICD-10-CM

## 2023-12-14 DIAGNOSIS — K7581 Nonalcoholic steatohepatitis (NASH): Secondary | ICD-10-CM

## 2023-12-14 DIAGNOSIS — K5900 Constipation, unspecified: Secondary | ICD-10-CM

## 2023-12-14 MED ORDER — LINACLOTIDE 290 MCG PO CAPS: 290.0000 ug | ORAL_CAPSULE | Freq: Every day | ORAL | 3 refills | Status: AC

## 2023-12-14 MED ORDER — ONDANSETRON HCL 8 MG PO TABS: 8.0000 mg | ORAL_TABLET | Freq: Three times a day (TID) | ORAL | 3 refills | Status: AC | PRN

## 2023-12-14 NOTE — Telephone Encounter (Signed)
 LM with family member to have pt to return call  us scheduled for Tuesday 12/26/23, arrive at 9:15 am, NPO after midnight.

## 2023-12-14 NOTE — Progress Notes (Signed)
 Gastroenterology Office Note     Primary Care Physician:  Marylynn Pearson, FNP  Primary Gastroenterologist: Dr. Jena Gauss    Chief Complaint   Chief Complaint  Patient presents with   Follow-up    Doing well, no issues     History of Present Illness   [name] is a 68 y.o. 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   Denies N/V, changes in bowel habits, diarrhea, overt GI bleeding, GERD, dysphagia, unexplained weight loss, lack of appetite, unexplained weight gain.   Some mid abdominal pain prior to BM then relieved for past few days. No fever/chills. No rectal bleeding. Out of Linzess. About a month out of Linzess.   No mental status changes, confusion, jaundice, pruritus. No abdominal distension or lower extremity edema.    EGD Nov 2022 without varices. Next will be due in Nov 2025.  Colonoscopy due in 2029.      Cirrhosis care: [place] on file from Aug 2024 and overdue for surveillance.  AFP tumor marker 2.2 in Nov 2024.  Last MELD 3.0: 9 in Nov 2024 Completed Hep A/B vaccines in past    Past Medical History:  Diagnosis Date   Chronic constipation    Cirrhosis (HCC)    child pugh class A and MELD 5, completed hep A and B vaccine series, no HCC on 3/16 MRI   Colonic inertia    Depression    Diverticula of colon    Diverticulosis 01/31/2008   colonoscopy Dr Jena Gauss   GERD (gastroesophageal reflux disease)    Hyperplastic colon polyp    Liver mass    Macular degeneration    Obesity    PUD (peptic ulcer disease) 1988   Schizoaffective disorder    Mental Health-Dr Dekle   Seizures (HCC)    as child; no medsd and has had no seizures since then.   Wrist fracture    Left wrist s/p fall and surgical repair    Past Surgical History:  Procedure Laterality Date   ABDOMINAL HYSTERECTOMY     CHOLECYSTECTOMY  08/2010   cholelithiasis; Dr Lovell Sheehan   COLONOSCOPY  01/31/2008   Dr. Jena Gauss-  normal rectum, L sided diverticula, long tortuous colon. hyperplasitic  polyp.   COLONOSCOPY WITH PROPOFOL N/A 10/19/2017    single 4 mm polyp in the sigmoid colon which was sessile, scattered small mouth diverticula in the sigmoid and descending colon, otherwise normal.  Surgical pathology found the polyp to be hyperplastic.  Recommended 10-year repeat colonoscopy.   ERCP W/ SPHINCTEROTOMY AND BALLOON DILATION  08/05/2010   normal ampulla/mild erythema in the gastric remnant without ulcerations or erosions. normal retroflexed view of the cardia/anastomosis normal/distal common bile duct stones s/p extraction and shincterotomy   ESOPHAGOGASTRODUODENOSCOPY N/A 10/24/2013   ZOX:WRUEAV esophagus. Surgically altered stomach. Abnormal gastric mucosa  -  status post biopsy (reactive gastrophathy)   ESOPHAGOGASTRODUODENOSCOPY (EGD) WITH PROPOFOL N/A 11/30/2015   LA grade A esophagitis, no varices, surveillance in 2019   ESOPHAGOGASTRODUODENOSCOPY (EGD) WITH PROPOFOL N/A 10/19/2017   erosive reflux esophagitis status post dilation, surgically altered stomach status post gastric bypass with a single patent apparent small bowel limb, small hiatal hernia.   ESOPHAGOGASTRODUODENOSCOPY (EGD) WITH PROPOFOL N/A 07/16/2021   normal esophagus, no varices, portal gastropathy. 3 year screening   EUS N/A 08/20/2015   Dr. Christella Hartigan: well-circumscribed 5.2 cm chronic mass in porta hepatis, internally mass is gelatinous. Small distal esophageal varices (limited  view), Bilroth 1 type anatomy, cytology NEGATIVE for malignant cells. Suspicion for neoplasm low    FOOT SURGERY     left   GASTRIC BYPASS     with Revision, Dr. Rickard Patience DILATION N/A 10/19/2017   Procedure: Elease Hashimoto DILATION;  Surgeon: Corbin Ade, MD;  [facility]: [facility];  Service: Endoscopy;  Laterality: N/A;   POLYPECTOMY  10/19/2017   Procedure: POLYPECTOMY;  Surgeon: Corbin Ade, MD;  Location: [facility];  Service: Endoscopy;;   sigmoid colon (CS)    REDUCTION MAMMAPLASTY Bilateral    S/P Hysterectomy     partial   TONSILLECTOMY     WRIST SURGERY Left 05/2016    Current Outpatient Medications  Medication Sig Dispense Refill   albuterol (VENTOLIN HFA) 108 (90 Base) MCG/ACT inhaler Inhale 2 puffs into the lungs every 4 (four) hours as needed for shortness of breath or wheezing. 8 g 2   FLUoxetine (PROZAC) 40 MG capsule Take 40 mg by mouth in the morning.     methocarbamol (ROBAXIN) 500 MG tablet Take 500 mg by mouth 4 (four) times daily.     ondansetron (ZOFRAN) 4 MG tablet Take 1 tablet (4 mg total) by mouth every 8 (eight) hours as needed for nausea or vomiting. 30 tablet 5   pantoprazole (PROTONIX) 40 MG tablet TAKE 1 TABLET BY MOUTH DAILY 100 tablet 2   risperiDONE (RISPERDAL) 3 MG tablet Take 3 mg by mouth at bedtime.      traZODone (DESYREL) 150 MG tablet Take 300 mg by mouth at bedtime.     No current facility-administered medications for this visit.    Allergies as of 12/14/2023 - Review Complete 12/14/2023  Allergen Reaction Noted   Ciprofloxacin Rash    Kiwi extract Other (See Comments) 11/25/2015    Family History  Problem Relation Age of Onset   Diabetes Father    Colon cancer Maternal Uncle    Liver cancer Maternal Uncle        58 years old   Liver disease Neg Hx     Social History   Socioeconomic History   Marital status: Divorced    Spouse name: Not on file   Number of children: 2   Years of education: Not on file   Highest education level: Not on file  Occupational History   Occupation: disabled  Tobacco Use   Smoking status: Every Day    Current packs/day: 1.00    Average packs/day: 1 pack/day for 36.0 years (36.0 ttl pk-yrs)    Types: Cigarettes   Smokeless tobacco: Never  Vaping Use   Vaping status: Never Used  Substance and Sexual Activity   Alcohol use: No    Alcohol/week: 0.0 standard drinks of alcohol   Drug use: Yes    Types: Marijuana    Comment: 3 joints/day    Sexual activity: Never  Other Topics Concern   Not on file  Social History Narrative   Lives w/ mother   2 grown children   Social Drivers of Health   Financial Resource Strain: Not on file  Food Insecurity: No Food Insecurity (10/16/2023)   Hunger Vital Sign    Worried About Running Out of Food in the Last Year: Never true    Ran Out of Food in the Last Year: Never true  Transportation Needs: No Transportation Needs (10/16/2023)   PRAPARE - Administrator, Civil Service (Medical): No    Lack of Transportation (Non-Medical): No  Physical Activity: Not on file  Stress: Not on file  Social Connections: Socially Isolated (10/17/2023)   Social Connection and Isolation Panel [NHANES]    Frequency of Communication with Friends and Family: Three times a week    Frequency of Social Gatherings with Friends and Family: More than three times a week    Attends Religious Services: Never    Database administrator or Organizations: No    Attends Banker Meetings: Never    Marital Status: Divorced  Catering manager Violence: Not At Risk (10/16/2023)   Humiliation, Afraid, Rape, and Kick questionnaire    Fear of Current or Ex-Partner: No    Emotionally Abused: No    Physically Abused: No    Sexually Abused: No     Review of Systems   Gen: Denies any fever, chills, fatigue, weight loss, lack of appetite.  CV: Denies chest pain, heart palpitations, peripheral edema, syncope.  Resp: Denies shortness of breath at rest or with exertion. Denies wheezing or cough.  GI: Denies dysphagia or odynophagia. Denies jaundice, hematemesis, fecal incontinence. GU : Denies urinary burning, urinary frequency, urinary hesitancy MS: Denies joint pain, muscle weakness, cramps, or limitation of movement.  Derm: Denies rash, itching, dry skin Psych: Denies depression, anxiety, memory loss, and confusion Heme: Denies bruising, bleeding, and enlarged lymph nodes.   Physical Exam   BP  96/63 (BP Location: Right Arm, Patient Position: Sitting, Cuff Size: Large)   Pulse 73   Temp 98.3 F (36.8 C) (Oral)   Ht 5\' 4"  (1.626 m)   Wt 202 lb 6.4 oz (91.8 kg)   SpO2 94%   BMI 34.74 kg/m  General:   Alert and oriented. Pleasant and cooperative. Well-nourished and well-developed.  Head:  Normocephalic and atraumatic. Eyes:  Without icterus Abdomen:  +BS, soft, non-tender and non-distended. No HSM noted. No guarding or rebound. No masses appreciated.  Rectal:  Deferred  Msk:  Symmetrical without gross deformities. Normal posture. Extremities:  Without edema. Neurologic:  Alert and  oriented x4;  grossly normal neurologically. Skin:  Intact without significant lesions or rashes. Psych:  Alert and cooperative. Normal mood and affect.   Assessment   [name] is a 68 y.o. female presenting today with a history of MASH cirrhosis, GERD, and constipation for routine 6 month follow-up.  MASH cirrhosis: Hep A/B vaccinations completed in the past. Remains well-compensated with last MELD 9. Will update labs now. Overdue for [place] and will arrange this. EGD for variceal screening in 2025. Can consider holding off if remains without thrombocytopenia.   Constipation: ran out of Linzess a month ago and now with mild abdominal discomfort below umbilicus prior to BM then relieved. Likely r/t constipation. Will send in Linzess again and provide samples. She is to call if no improvement. No alarm signs/symptoms.   Colonoscopy due in 2029.       PLAN   MELD labs and AFP today US abdomen complete Refilled Linzess 290 mcg daily and Zofran.  Return in 6 months   Gelene Mink, PhD, [facility]

## 2023-12-14 NOTE — Patient Instructions (Signed)
 Please have blood work done, and we are arranging an ultrasound in the near future!  I have sent in Zofran for you and Linzess. We also gave samples of Linzess to tide you over until you can have it shipped.  We will see you in 6 months and arrange an upper endoscopy at that time!   I enjoyed seeing you again today! I value our relationship and want to provide genuine, compassionate, and quality care. You may receive a survey regarding your visit with me, and I welcome your feedback! Thanks so much for taking the time to complete this. I look forward to seeing you again.      Gelene Mink, PhD, ANP-BC [facility]

## 2023-12-15 NOTE — Telephone Encounter (Signed)
 Pt made aware of [place] appt date, time,location and instructions. Verbalized understanding.

## 2023-12-26 ENCOUNTER — Ambulatory Visit (HOSPITAL_COMMUNITY)
Admission: RE | Admit: 2023-12-26 | Discharge: 2023-12-26 | Disposition: A | Discharge status: Home / Self Care | Source: Ambulatory Visit | Attending: Gastroenterology | Admitting: Gastroenterology

## 2023-12-26 DIAGNOSIS — K7469 Other cirrhosis of liver: Secondary | ICD-10-CM | POA: Insufficient documentation

## 2023-12-27 LAB — COMPREHENSIVE METABOLIC PANEL WITH GFR
ALT: 8 IU/L (ref 0–32)
AST: 10 IU/L (ref 0–40)
Albumin: 3.9 g/dL (ref 3.9–4.9)
Alkaline Phosphatase: 143 IU/L — ABNORMAL HIGH (ref 44–121)
BUN/Creatinine Ratio: 7 — ABNORMAL LOW (ref 12–28)
BUN: 8 mg/dL (ref 8–27)
Bilirubin Total: 0.3 mg/dL (ref 0.0–1.2)
CO2: 20 mmol/L (ref 20–29)
Calcium: 9.6 mg/dL (ref 8.7–10.3)
Chloride: 110 mmol/L — ABNORMAL HIGH (ref 96–106)
Creatinine, Ser: 1.15 mg/dL — ABNORMAL HIGH (ref 0.57–1.00)
Globulin, Total: 2.1 g/dL (ref 1.5–4.5)
Glucose: 85 mg/dL (ref 70–99)
Potassium: 4.2 mmol/L (ref 3.5–5.2)
Sodium: 145 mmol/L — ABNORMAL HIGH (ref 134–144)
Total Protein: 6 g/dL (ref 6.0–8.5)
eGFR: 52 mL/min/{1.73_m2} — ABNORMAL LOW (ref >59)

## 2023-12-27 LAB — CBC WITH DIFFERENTIAL/PLATELET
Basophils Absolute: 0 10*3/uL (ref 0.0–0.2)
Basos: 0 %
EOS (ABSOLUTE): 0.2 10*3/uL (ref 0.0–0.4)
Eos: 2 %
Hematocrit: 41.8 % (ref 34.0–46.6)
Hemoglobin: 13 g/dL (ref 11.1–15.9)
Immature Grans (Abs): 0 10*3/uL (ref 0.0–0.1)
Immature Granulocytes: 0 %
Lymphocytes Absolute: 1.7 10*3/uL (ref 0.7–3.1)
Lymphs: 18 %
MCH: 29.1 pg (ref 26.6–33.0)
MCHC: 31.1 g/dL — ABNORMAL LOW (ref 31.5–35.7)
MCV: 94 fL (ref 79–97)
Monocytes Absolute: 0.8 10*3/uL (ref 0.1–0.9)
Monocytes: 9 %
Neutrophils Absolute: 6.4 10*3/uL (ref 1.4–7.0)
Neutrophils: 71 %
Platelets: 417 10*3/uL (ref 150–450)
RBC: 4.46 x10E6/uL (ref 3.77–5.28)
RDW: 13.7 % (ref 11.7–15.4)
WBC: 9.1 10*3/uL (ref 3.4–10.8)

## 2023-12-27 LAB — AFP TUMOR MARKER: AFP, Serum, Tumor Marker: 2.3 ng/mL (ref 0.0–9.2)

## 2023-12-27 LAB — PROTIME-INR
INR: 1 (ref 0.9–1.2)
Prothrombin Time: 11.4 s (ref 9.1–12.0)

## 2024-02-02 ENCOUNTER — Other Ambulatory Visit: Payer: Self-pay | Admitting: Gastroenterology

## 2024-02-02 DIAGNOSIS — K219 Gastro-esophageal reflux disease without esophagitis: Secondary | ICD-10-CM

## 2024-04-10 DIAGNOSIS — H903 Sensorineural hearing loss, bilateral: Secondary | ICD-10-CM | POA: Insufficient documentation

## 2024-04-10 NOTE — Progress Notes (Signed)
 Otolaryngology Clinic Note  HPI:    New Patient (Pt presents for decreased hearing )    [name] Reasons is a 68 y.o. female who presents as a new patient, referred by No ref. provider found, for evaluation and treatment of hearing loss.  She has been experiencing hearing difficulties in both ears for over a year. There is no history of exposure to loud noises or any ear surgeries during her childhood. She expresses interest in exploring the use of hearing aids.  Recently, she suffered from a sinus infection but did not use any nasal sprays as part of her treatment.  PMH/Meds/All/SocHx/FamHx/ROS:   Medical History[1]  Surgical History[2]  No family history of bleeding disorders, wound healing problems or difficulty with anesthesia.   Social History   Socioeconomic History  . Marital status: Divorced    Spouse name: Not on file  . Number of children: Not on file  . Years of education: Not on file  . Highest education level: Not on file  Occupational History  . Not on file  Tobacco Use  . Smoking status: Every Day  . Smokeless tobacco: Never  Substance and Sexual Activity  . Alcohol use: No  . Drug use: Not on file  . Sexual activity: Not on file  Other Topics Concern  . Not on file  Social History Narrative  . Not on file   Social Drivers of Health   Food Insecurity: No Food Insecurity (10/16/2023)   Received from [facility]   Food vital sign   . Within the past 12 months, you worried that your food would run out before you got money to buy more: Never true   . Within the past 12 months, the food you bought just didn't last and you didn't have money to get more: Never true  Transportation Needs: No Transportation Needs (10/16/2023)   Received from [facility] - Transportation   . Lack of Transportation (Medical): No   . Lack of Transportation (Non-Medical): No  Safety: Not At Risk (10/16/2023)   Received from [facility]   Safety   . Within the last  year, have you been afraid of your partner or ex-partner?: No   . Within the last year, have you been humiliated or emotionally abused in other ways by your partner or ex-partner?: No   . Within the last year, have you been kicked, hit, slapped, or otherwise physically hurt by your partner or ex-partner?: No   . Within the last year, have you been raped or forced to have any kind of sexual activity by your partner or ex-partner?: No  Living Situation: Low Risk  (10/16/2023)   Received from [facility] Situation   . In the last 12 months, was there a time when you were not able to pay the mortgage or rent on time?: No   . In the past 12 months, how many times have you moved where you were living?: 0   . At any time in the past 12 months, were you homeless or living in a shelter (including now)?: No    Current Medications[3]  A complete ROS was performed with pertinent positives/negatives noted in the HPI. The remainder of the ROS are negative.    Physical Exam:    BP 112/74   Pulse 60   Temp 97.5 F (36.4 C) (Temporal)   Ht 1.626 m (5' 4)   Wt 94.9 kg (209 lb 3.2 oz)  BMI 35.91 kg/m    General: Well developed, well nourished. No acute distress.  Head/Face: Normocephalic, atraumatic. No scars or lesions. No sinus tenderness. Facial nerve intact and equal bilaterally.  Eyes: Pupils are equal, round and reactive to light. Conjunctiva and lids are normal. Normal extraocular mobility.   Ears:   Right: Pinna and external meatus normal, normal ear canal skin and caliber without excessive cerumen or drainage. Tympanic membrane intact without effusion or infection.    Left: Pinna and external meatus normal, normal ear canal skin and caliber without excessive cerumen or drainage. Tympanic membrane intact without effusion or infection.   Nose: No gross deformity or lesions. No purulent discharge.  Mouth/Oropharynx: Lips normal, teeth and gums normal with good dentition, normal oral  vestibule. Normal floor of mouth, tongue and oral mucosa, no mucosal lesions, ulcer or mass, normal tongue mobility. Uvula midline. Hard and soft palate normal with normal mobility. Symmetric tonsils, no erythema or exudate.  Larynx: See TFL if applicable  Nasopharynx: See TFL if applicable  Neck: Trachea midline. No masses. No crepitus. Normal thyroid  glad palpation without thyromegaly or nodules palpated. Salivary glands normal to palpation without swelling, erythema or mass.   Lymphatic: No lymphadenopathy in the neck.  Respiratory: No stridor or distress.  Extremities: No edema or cyanosis. Warm and well-perfused.  Neurologic: CN II-XII intact. Alert and oriented to self, place and time.  Normal reflexes and motor skills, balance and coordination. Moving all extremities without gross abnormality.  Psychiatric:  No unusual anxiety or evidence of depression. Appropriate affect.    Independent Review of Additional Tests or Records:  Audiogram demonstrates mild to moderately severe sloping sensorineural hearing loss, with type C tympanograms bilaterally  Procedures:  None  Impression & Plans:  [name] is a 68 y.o. female with recent history of sinusitis presenting for evaluation of bilateral hearing loss.  Audiogram performed today reviewed, demonstrates sloping sensorineural hearing loss bilaterally, with type C tympanograms.  On physical exam, both tympanic membranes appear healthy, with well-pneumatized middle ear space and normal mobility with attempted auto insufflation.  Suspect type C tympanograms are secondary to recent sinusitis, and recommended continuing frequent auto insufflation with daily use of Flonase .  Recommend scheduling a hearing aid consultation at her convenience, as patient would benefit from use of amplification.  Recommend annual surveillance of hearing for continued monitoring.  Follow-up with ENT as needed.  Meghan Jenkins Shope,  DO Otolaryngology         [1] Past Medical History: Diagnosis Date  . Cataract   [2] History reviewed. No pertinent surgical history. [3]  Current Outpatient Medications:  .  FLUoxetine  (PROzac ) 40 mg capsule, Take  by mouth., Disp: , Rfl:  .  pantoprazole  (PROTONIX ) 40 mg EC tablet, Take  by mouth., Disp: , Rfl:  .  risperiDONE  (RisperDAL ) 2 mg tab tablet, Take  by mouth., Disp: , Rfl:  .  traZODone  (DESYREL ) 150 mg tablet, Take  by mouth., Disp: , Rfl:  .  alendronate (FOSAMAX) 70 mg tablet, Take  by mouth. (Patient not taking: Reported on 04/10/2024), Disp: , Rfl:  .  calcium carbonate-vitamin D3 (Oyster Shell Calcium-Vit D3) 500 mg (200 mg Ca)-5 mcg (200 units Vit D) per tablet, Take 1 tablet by mouth 2 (two) times a day with meals. (Patient not taking: Reported on 04/10/2024), Disp: , Rfl:  .  cinnamon 500 mg cap, Take 500 mg by mouth Once Daily. (Patient not taking: Reported on 04/10/2024), Disp: , Rfl:  .  fluticasone  propionate (FLONASE ) 50 mcg/spray nasal spray, 2 sprays into each nostril once daily or 1 spray into each nostril twice daily., Disp: 16 g, Rfl: 11 .  linaCLOtide  (Linzess ) 290 mcg cap capsule, TAKE ONE CAPSULE BY MOUTH DAILY 30 MINUTES BEFORE BREAKFAST. (Patient not taking: Reported on 04/10/2024), Disp: , Rfl:  .  pyridoxine (VITAMIN B6) 100 mg tablet, Take 100 mg by mouth Once Daily. (Patient not taking: Reported on 04/10/2024), Disp: , Rfl:  .  zinc gluconate 50 mg tab tablet, Take 50 mg by mouth Once Daily. (Patient not taking: Reported on 04/10/2024), Disp: , Rfl:

## 2024-04-18 ENCOUNTER — Ambulatory Visit: Attending: Internal Medicine | Admitting: Internal Medicine

## 2024-04-18 ENCOUNTER — Encounter: Payer: Self-pay | Admitting: Internal Medicine

## 2024-04-18 ENCOUNTER — Ambulatory Visit: Attending: Internal Medicine

## 2024-04-18 VITALS — BP 118/70 | HR 69 | Ht 64.0 in | Wt 205.4 lb

## 2024-04-18 DIAGNOSIS — R001 Bradycardia, unspecified: Secondary | ICD-10-CM

## 2024-04-18 NOTE — Patient Instructions (Signed)
Medication Instructions:  Your physician recommends that you continue on your current medications as directed. Please refer to the Current Medication list given to you today.   Labwork: None today  Testing/Procedures: ZIO XT- Long Term Monitor Instructions   Your physician has requested you wear your ZIO patch monitor___14____days.   This is a single patch monitor.  [employer] supplies one patch monitor per enrollment.  Additional stickers are not available.   Please do not apply patch if you will be having a Nuclear Stress Test, Echocardiogram, Cardiac CT, MRI, or Chest Xray during the time frame you would be wearing the monitor. The patch cannot be worn during these tests.  You cannot remove and re-apply the ZIO XT patch monitor.   Do not shower for the first 24 hours.  You may shower after the first 24 hours.   Press button if you feel a symptom. You will hear a small click.  Record Date, Time and Symptom in the Patient Log Book.   When you are ready to remove patch, follow instructions on last 2 pages of Patient Log Book.  Stick patch monitor onto last page of Patient Log Book.   Place Patient Log Book in [place] box.  Use locking tab on box and tape box closed securely.  The [employer] and [employer] has [employer] on it.  Please place in mailbox as soon as possible.  Your physician should have your test results approximately 7 days after the monitor has been mailed back to [facility].   Call [place] at [phone] if you have questions regarding your ZIO XT patch monitor.  Call them immediately if you see an orange light blinking on your monitor.   If your monitor falls off in less than 4 days contact our Monitor department at [phone].  If your monitor becomes loose or falls off after 4 days call [employer] at [phone] for suggestions on securing your monitor.    Follow-Up: To be determined after monitor  Any Other Special Instructions Will Be Listed  Below (If Applicable).  If you need a refill on your cardiac medications before your next appointment, please call your pharmacy.

## 2024-04-18 NOTE — Progress Notes (Signed)
 Cardiology Office Note  Date: 04/18/2024   ID: [name], DOB Nov 15, 1955, MRN [id number]  PCP:  Vick Lurie, FNP  Cardiologist:  Diannah SHAUNNA Maywood, MD Electrophysiologist:  None   History of Present Illness: [name] is a 68 y.o. female  Referred to cardiology clinic for evaluation of nocturnal bradycardia.  Patient was admitted to [facility] in February 2025 for the management of acute hypoxic respiratory failure secondary COPD exacerbation in setting of influenza A infection.  She was noted to have high 30 to low 40 heart rates at night when she was sleeping and she was given atropine  with increase in heart rates.  She was completely asymptomatic at that time.  Cardiology was consulted at that time and the plan was to obtain 2-week heart monitor and to see her in the clinic.  Today, she reports that the event monitor never stuck onto her chest and hence was never performed.  She reports having dizziness sporadically, once in few months, had no syncope.  She feels tired all the time.  No chest pain or SOB.    Past Medical History:  Diagnosis Date   Chronic constipation    Cirrhosis (HCC)    child pugh class A and MELD 5, completed hep A and B vaccine series, no HCC on 3/16 MRI   Colonic inertia    Depression    Diverticula of colon    Diverticulosis 01/31/2008   colonoscopy Dr Shaaron   GERD (gastroesophageal reflux disease)    Hyperplastic colon polyp    Liver mass    Macular degeneration    Obesity    PUD (peptic ulcer disease) 1988   Schizoaffective disorder    Mental Health-Dr Dekle   Seizures (HCC)    as child; no medsd and has had no seizures since then.   Wrist fracture    Left wrist s/p fall and surgical repair    Past Surgical History:  Procedure Laterality Date   ABDOMINAL HYSTERECTOMY     CHOLECYSTECTOMY  08/2010   cholelithiasis; Dr Mavis   COLONOSCOPY  01/31/2008   Dr. Shaaron- normal rectum, L sided diverticula, long tortuous colon.  hyperplasitic  polyp.   COLONOSCOPY WITH PROPOFOL  N/A 10/19/2017    single 4 mm polyp in the sigmoid colon which was sessile, scattered small mouth diverticula in the sigmoid and descending colon, otherwise normal.  Surgical pathology found the polyp to be hyperplastic.  Recommended 10-year repeat colonoscopy.   ERCP W/ SPHINCTEROTOMY AND BALLOON DILATION  08/05/2010   normal ampulla/mild erythema in the gastric remnant without ulcerations or erosions. normal retroflexed view of the cardia/anastomosis normal/distal common bile duct stones s/p extraction and shincterotomy   ESOPHAGOGASTRODUODENOSCOPY N/A 10/24/2013   MFM:Wnmfjo esophagus. Surgically altered stomach. Abnormal gastric mucosa  -  status post biopsy (reactive gastrophathy)   ESOPHAGOGASTRODUODENOSCOPY (EGD) WITH PROPOFOL  N/A 11/30/2015   LA grade A esophagitis, no varices, surveillance in 2019   ESOPHAGOGASTRODUODENOSCOPY (EGD) WITH PROPOFOL  N/A 10/19/2017   erosive reflux esophagitis status post dilation, surgically altered stomach status post gastric bypass with a single patent apparent small bowel limb, small hiatal hernia.   ESOPHAGOGASTRODUODENOSCOPY (EGD) WITH PROPOFOL  N/A 07/16/2021   normal esophagus, no varices, portal gastropathy. 3 year screening   EUS N/A 08/20/2015   Dr. Teressa: well-circumscribed 5.2 cm chronic mass in porta hepatis, internally mass is gelatinous. Small distal esophageal varices (limited view), Bilroth 1 type anatomy, cytology NEGATIVE for malignant cells. Suspicion for neoplasm low    FOOT SURGERY  left   GASTRIC BYPASS     with Revision, Dr. Darien STAI DILATION N/A 10/19/2017   Procedure: STAI DILATION;  Surgeon: Shaaron Lamar HERO, MD;  Location: [facility];  Service: Endoscopy;  Laterality: N/A;   POLYPECTOMY  10/19/2017   Procedure: POLYPECTOMY;  Surgeon: Shaaron Lamar HERO, MD;  Location: [facility];  Service: Endoscopy;;  sigmoid colon (CS)    REDUCTION MAMMAPLASTY Bilateral     S/P Hysterectomy     partial   TONSILLECTOMY     WRIST SURGERY Left 05/2016    Current Outpatient Medications  Medication Sig Dispense Refill   albuterol  (VENTOLIN  HFA) 108 (90 Base) MCG/ACT inhaler Inhale 2 puffs into the lungs every 4 (four) hours as needed for shortness of breath or wheezing. 8 g 2   FLUoxetine  (PROZAC ) 40 MG capsule Take 40 mg by mouth in the morning.     linaclotide  (LINZESS ) 290 MCG CAPS capsule Take 1 capsule (290 mcg total) by mouth daily before breakfast. 90 capsule 3   methocarbamol  (ROBAXIN ) 500 MG tablet Take 500 mg by mouth 4 (four) times daily.     ondansetron  (ZOFRAN ) 4 MG tablet Take 1 tablet (4 mg total) by mouth every 8 (eight) hours as needed for nausea or vomiting. 30 tablet 5   ondansetron  (ZOFRAN ) 8 MG tablet Take 1 tablet (8 mg total) by mouth every 8 (eight) hours as needed for nausea or vomiting. 30 tablet 3   pantoprazole  (PROTONIX ) 40 MG tablet TAKE 1 TABLET BY MOUTH DAILY 100 tablet 2   risperiDONE  (RISPERDAL ) 3 MG tablet Take 3 mg by mouth at bedtime.      SYMBICORT 160-4.5 MCG/ACT inhaler Inhale 2 puffs into the lungs daily.     traZODone  (DESYREL ) 150 MG tablet Take 300 mg by mouth at bedtime.     No current facility-administered medications for this visit.   Allergies:  Ciprofloxacin and Kiwi extract   Social History: The patient  reports that she has been smoking cigarettes. She has a 36 pack-year smoking history. She has never used smokeless tobacco. She reports current drug use. Drug: Marijuana. She reports that she does not drink alcohol.   Family History: The patient's family history includes Colon cancer in her maternal uncle; Diabetes in her father; Liver cancer in her maternal uncle.   ROS:  Please see the history of present illness. Otherwise, complete review of systems is positive for none.  All other systems are reviewed and negative.   Physical Exam: VS:  BP 118/70   Pulse 69   Ht 5' 4 (1.626 m)   Wt 205 lb 6.4 oz (93.2  kg)   SpO2 94%   BMI 35.26 kg/m , BMI Body mass index is 35.26 kg/m.  Wt Readings from Last 3 Encounters:  04/18/24 205 lb 6.4 oz (93.2 kg)  12/14/23 202 lb 6.4 oz (91.8 kg)  10/16/23 211 lb (95.7 kg)    General: Patient appears comfortable at rest. HEENT: Conjunctiva and lids normal, oropharynx clear with moist mucosa. Neck: Supple, no elevated JVP or carotid bruits, no thyromegaly. Lungs: Clear to auscultation, nonlabored breathing at rest. Cardiac: Regular rate and rhythm, no S3 or significant systolic murmur, no pericardial rub. Abdomen: Soft, nontender, no hepatomegaly, bowel sounds present, no guarding or rebound. Extremities: No pitting edema, distal pulses 2+. Skin: Warm and dry. Musculoskeletal: No kyphosis. Neuropsychiatric: Alert and oriented x3, affect grossly appropriate.  Recent Labwork: 10/16/2023: B Natriuretic Peptide 170.0 10/18/2023: Magnesium 2.1 12/26/2023: ALT  8; AST 10; BUN 8; Creatinine, Ser 1.15; Hemoglobin 13.0; Platelets 417; Potassium 4.2; Sodium 145  No results found for: CHOL, TRIG, HDL, CHOLHDL, VLDL, LDLCALC, LDLDIRECT   Assessment and Plan:  Nocturnal bradycardia: Patient was given atropine  for high 30 to low 40 heart rates during sleep at [facility] during her febrile 2025 hospitalization.  She was completely asymptomatic at that time.  This is benign.  Reassurance provided.  Plan was to obtain 2-week event monitor but this did not stick to her chest and hence was never done.  She reports having sporadic dizziness and fatigue.  Will obtain 2-week event monitor.    Medication Adjustments/Labs and Tests Ordered: Current medicines are reviewed at length with the patient today.  Concerns regarding medicines are outlined above.    Disposition:  Follow up pending results  Signed, Octavian Godek Arleta Maywood, MD, 04/18/2024 2:12 PM    [facility] at [facility] [address], [place], [place] [address]

## 2024-04-25 ENCOUNTER — Other Ambulatory Visit (HOSPITAL_COMMUNITY): Payer: Self-pay | Admitting: Adult Health

## 2024-04-25 DIAGNOSIS — Z13820 Encounter for screening for osteoporosis: Secondary | ICD-10-CM

## 2024-05-08 ENCOUNTER — Emergency Department (HOSPITAL_COMMUNITY)
Admission: EM | Admit: 2024-05-08 | Discharge: 2024-05-08 | Disposition: A | Discharge status: Home / Self Care | Attending: Emergency Medicine | Admitting: Emergency Medicine

## 2024-05-08 ENCOUNTER — Emergency Department (HOSPITAL_COMMUNITY)

## 2024-05-08 ENCOUNTER — Other Ambulatory Visit: Payer: Self-pay

## 2024-05-08 ENCOUNTER — Encounter (HOSPITAL_COMMUNITY): Payer: Self-pay | Admitting: Emergency Medicine

## 2024-05-08 DIAGNOSIS — W010XXA Fall on same level from slipping, tripping and stumbling without subsequent striking against object, initial encounter: Secondary | ICD-10-CM | POA: Diagnosis not present

## 2024-05-08 DIAGNOSIS — S62101A Fracture of unspecified carpal bone, right wrist, initial encounter for closed fracture: Secondary | ICD-10-CM | POA: Diagnosis not present

## 2024-05-08 DIAGNOSIS — M25531 Pain in right wrist: Secondary | ICD-10-CM | POA: Diagnosis present

## 2024-05-08 DIAGNOSIS — W19XXXA Unspecified fall, initial encounter: Secondary | ICD-10-CM

## 2024-05-08 MED ORDER — SODIUM CHLORIDE 0.9 % IV BOLUS
500.0000 mL | Freq: Once | INTRAVENOUS | Status: AC
  Administered 2024-05-08: 500 mL via INTRAVENOUS

## 2024-05-08 MED ORDER — FENTANYL CITRATE (PF) 100 MCG/2ML IJ SOLN
50.0000 ug | Freq: Once | INTRAMUSCULAR | Status: AC
  Administered 2024-05-08: 50 ug via INTRAVENOUS
  Filled 2024-05-08: qty 2

## 2024-05-08 MED ORDER — BUPIVACAINE HCL (PF) 0.25 % IJ SOLN
20.0000 mL | Freq: Once | INTRAMUSCULAR | Status: AC
  Administered 2024-05-08: 20 mL
  Filled 2024-05-08: qty 30

## 2024-05-08 MED ORDER — HYDROCODONE-ACETAMINOPHEN 5-325 MG PO TABS
1.0000 | ORAL_TABLET | Freq: Four times a day (QID) | ORAL | 0 refills | Status: DC | PRN
Start: 2024-05-08 — End: 2024-05-10

## 2024-05-08 NOTE — ED Provider Notes (Signed)
 [place] EMERGENCY DEPARTMENT AT [facility] Provider Note   CSN: [id number] Arrival date & time: 05/08/24  9049     Patient presents with: Injury   [name] is a 68 y.o. female.   HPI Patient presents after mechanical fall with right wrist pain.  She notes that she slipped, fell onto her wrist.  No other injuries no other complaints.  However, the patient has substantial pain and inability to move the wrist secondary to this. No loss of sensation distally. She has a history of prior fracture on the contralateral side.    Prior to Admission medications   Medication Sig Start Date End Date Taking? Authorizing Provider  HYDROcodone -acetaminophen  (NORCO/VICODIN) 5-325 MG tablet Take 1 tablet by mouth every 6 (six) hours as needed for severe pain (pain score 7-10). 05/08/24  Yes Garrick Charleston, MD  albuterol  (VENTOLIN  HFA) 108 (90 Base) MCG/ACT inhaler Inhale 2 puffs into the lungs every 4 (four) hours as needed for shortness of breath or wheezing. 10/23/23   Johnson, Clanford L, MD  FLUoxetine  (PROZAC ) 40 MG capsule Take 40 mg by mouth in the morning. 06/06/12   [provider]  linaclotide  (LINZESS ) 290 MCG CAPS capsule Take 1 capsule (290 mcg total) by mouth daily before breakfast. 12/14/23   Shirlean Therisa ORN, NP  methocarbamol  (ROBAXIN ) 500 MG tablet Take 500 mg by mouth 4 (four) times daily.    [provider]  ondansetron  (ZOFRAN ) 4 MG tablet Take 1 tablet (4 mg total) by mouth every 8 (eight) hours as needed for nausea or vomiting. 06/15/23   Shirlean Therisa ORN, NP  ondansetron  (ZOFRAN ) 8 MG tablet Take 1 tablet (8 mg total) by mouth every 8 (eight) hours as needed for nausea or vomiting. 12/14/23   Shirlean Therisa ORN, NP  pantoprazole  (PROTONIX ) 40 MG tablet TAKE 1 TABLET BY MOUTH DAILY 02/05/24   Shirlean Therisa ORN, NP  risperiDONE  (RISPERDAL ) 3 MG tablet Take 3 mg by mouth at bedtime.     [provider]  SYMBICORT 160-4.5 MCG/ACT inhaler Inhale 2 puffs  into the lungs daily. 02/07/24   [provider]  traZODone  (DESYREL ) 150 MG tablet Take 300 mg by mouth at bedtime. 07/20/15   [provider]    Allergies: Ciprofloxacin and Kiwi extract    Review of Systems  Updated Vital Signs BP 116/62   Pulse 92   Temp 98 F (36.7 C) (Oral)   Resp (!) 21   SpO2 96%   Physical Exam Vitals and nursing note reviewed.  Constitutional:      General: She is not in acute distress.    Appearance: She is well-developed.  HENT:     Head: Normocephalic and atraumatic.  Eyes:     Conjunctiva/sclera: Conjunctivae normal.  Cardiovascular:     Rate and Rhythm: Normal rate and regular rhythm.     Pulses: Normal pulses.  Pulmonary:     Effort: Pulmonary effort is normal. No respiratory distress.     Breath sounds: No stridor.  Abdominal:     General: There is no distension.  Musculoskeletal:        General: Deformity present.     Comments: Right.  She wiggles all fingers appropriately to command, no elbow, shoulder issues.  Skin:    General: Skin is warm and dry.  Neurological:     Mental Status: She is alert and oriented to person, place, and time.     Cranial Nerves: No cranial nerve deficit.  Psychiatric:        Mood and Affect: Mood normal.     (all labs ordered are listed, but only abnormal results are displayed) Labs Reviewed - No data to display  EKG: None  Radiology: DG Wrist Complete Right Result Date: 05/08/2024 CLINICAL DATA:  Status post splint EXAM: RIGHT WRIST - COMPLETE 3+ VIEW COMPARISON:  Wrist radiograph May 08, 2024 FINDINGS: Status post reduction with near normal anatomic alignment with some volar apex angulation of distal radial comminuted intra-articular fracture. Ulnar styloid fracture without significant displacement. Mild soft tissue swelling. IMPRESSION: Status post reduction and casting of distal radial comminuted fracture with near normal anatomic alignment and some volar apex angulation .  Ulnar styloid fracture. Electronically Signed   By: Megan  Zare M.D.   On: 05/08/2024 13:22   DG Wrist Complete Right Result Date: 05/08/2024 EXAM: 3 or more VIEW(S) XRAY OF THE RIGHT WRIST 05/08/2024 10:15:00 AM COMPARISON: None available. CLINICAL HISTORY: Fall, Fx?. Per triage; Pt c/o R wrist injury and states I think I broke it, from a fall today that she stated I slipped on some water , rates pain 10/10 R wrist. Denies use of blood thinners, denies hitting head, denies LOC. Deformity noted to R wrist. FINDINGS: BONES AND JOINTS: Acute dorsally displaced and angulated, intra-articular, comminuted distal radial metaphyseal fracture. Posterior displacement of the distal fracture fragment. Acute ulnar styloid fracture. SOFT TISSUES: Soft tissue edema. IMPRESSION: 1. Acute dorsally displaced and angulated, intra-articular, comminuted distal radial metaphyseal fracture with posterior displacement of the distal fracture fragment. 2. Acute ulnar styloid fracture. 3. Soft tissue edema. Electronically signed by: Dayne Hassell MD 05/08/2024 10:45 AM EDT RP Workstation: [id number]     .Ortho Injury Treatment  Date/Time: 05/08/2024 12:30 PM  Performed by: Garrick Charleston, MD Authorized by: Garrick Charleston, MD   Consent:    Consent obtained:  Verbal   Consent given by:  Patient   Risks discussed:  Nerve damage, restricted joint movement, vascular damage and stiffness   Alternatives discussed:  No treatment and alternative treatment Universal protocol:    Imaging studies available: yes     Site/side marked: yes     Immediately prior to procedure a time out was called: yes     Patient identity confirmed:  Verbally with patientInjury location: wrist Location details: right wrist Injury type: fracture Fracture type: distal radius and ulnar styloid Pre-procedure neurovascular assessment: neurovascularly intact Pre-procedure distal perfusion: normal Pre-procedure neurological function:  normal Pre-procedure range of motion: reduced Anesthesia: hematoma block  Anesthesia: Local anesthesia used: yes Local Anesthetic: bupivacaine  0.25% without epinephrine Anesthetic total: 5 mL  Patient sedated: NoManipulation performed: yes Skeletal traction used: yes Reduction successful: yes X-ray confirmed reduction: yes (Improved) Immobilization: splint Splint type: sugar tong Splint Applied by: ED Nurse and ED Tech Supplies used: plaster Post-procedure neurovascular assessment: post-procedure neurovascularly intact Post-procedure distal perfusion: normal Post-procedure neurological function: normal Post-procedure range of motion: improved      Medications Ordered in the ED  sodium chloride  0.9 % bolus 500 mL (500 mLs Intravenous New Bag/Given 05/08/24 1100)  fentaNYL  (SUBLIMAZE ) injection 50 mcg (50 mcg Intravenous Given 05/08/24 1101)  bupivacaine  (PF) (MARCAINE ) 0.25 % injection 20 mL (20 mLs Infiltration Given 05/08/24 1128)                                    Medical Decision Making Adult female with multiple medical issues presents with mechanical fall with  wrist deformity.  Initial neuroexam reassuring, concern for fracture, x-ray ordered, analgesics ordered.  Amount and/or Complexity of Data Reviewed Radiology: ordered and independent interpretation performed. Decision-making details documented in ED Course.  Risk Prescription drug management.   1:56 PM Patient with improved near normal anatomic alignment on repeat x-ray, procedures well-tolerated, I discussed her case with her orthopedic colleagues patient will follow-up.     Final diagnoses:  Fall, initial encounter  Closed fracture of right wrist, initial encounter    ED Discharge Orders          Ordered    HYDROcodone -acetaminophen  (NORCO/VICODIN) 5-325 MG tablet  Every 6 hours PRN        05/08/24 1356               Garrick Charleston, MD 05/08/24 1356

## 2024-05-08 NOTE — ED Triage Notes (Signed)
 Pt c/o R wrist injury and states I think I broke it, from a fall today that she stated I slipped on some water , rates pain 10/10 R wrist. Denies use of blood thinners, denies hitting head, denies LOC. Deformity noted to R wrist

## 2024-05-08 NOTE — ED Notes (Signed)
 EDP in room assisted with placing patient's  right hand in digi grip finger trap system. Patient tolerated procedure without any difficulty.

## 2024-05-08 NOTE — Discharge Instructions (Addendum)
 Please be sure to follow-up with our orthopedic surgeon, Dr. Margrette.  Return here for concerning changes in your condition.

## 2024-05-09 ENCOUNTER — Telehealth: Payer: Self-pay | Admitting: Orthopedic Surgery

## 2024-05-09 NOTE — Telephone Encounter (Signed)
 Patient was seen in the ED yesterday for a fx rt wrist, please advise on scheduling.  [phone]

## 2024-05-09 NOTE — Telephone Encounter (Signed)
 Tried to reach the pt multiple times, no answer, no vm. Call her contact, Wadie her son.  He is going to get the message to the pt w/her appt day/time.

## 2024-05-10 ENCOUNTER — Other Ambulatory Visit: Payer: Self-pay | Admitting: Orthopedic Surgery

## 2024-05-10 ENCOUNTER — Ambulatory Visit: Admitting: Orthopedic Surgery

## 2024-05-10 DIAGNOSIS — S52531A Colles' fracture of right radius, initial encounter for closed fracture: Secondary | ICD-10-CM | POA: Diagnosis not present

## 2024-05-10 DIAGNOSIS — Z01818 Encounter for other preprocedural examination: Secondary | ICD-10-CM | POA: Diagnosis not present

## 2024-05-10 MED ORDER — HYDROCODONE-ACETAMINOPHEN 5-325 MG PO TABS: 1.0000 | ORAL_TABLET | Freq: Four times a day (QID) | ORAL | 0 refills | Status: DC | PRN

## 2024-05-10 NOTE — Telephone Encounter (Signed)
 Patient requested Hydrocodone  as she was leaving [facility] scales

## 2024-05-10 NOTE — Progress Notes (Signed)
  Intake history:  Chief Complaint  Patient presents with   Fracture    R wrist DOI 05/08/24     There were no vitals taken for this visit. There is no height or weight on file to calculate BMI.    WHAT ARE WE SEEING YOU FOR TODAY?   right wrist(s)  How long has this bothered you? (DOI?DOS?WS?)  on 05/08/24  Was there an injury? Yes  Anticoag.  No  Diabetes No  Heart disease No  Hypertension No  SMOKING HX Yes  Kidney disease No  Any ALLERGIES ______________________________________________   Treatment:  Have you taken:  Tylenol  No  Advil  No  Had PT No  Had injection No  Other  _________________________

## 2024-05-10 NOTE — Patient Instructions (Signed)
 Your surgery will be at [facility] by Dr Margrette  The hospital will contact you with a preoperative appointment to discuss Anesthesia.  Please arrive on time or 15 minutes early for the preoperative appointment, they have a very tight schedule if you are late or do not come in your surgery will be cancelled.  The phone number is [phone]. Please bring your medications with you for the appointment. They will tell you the arrival time and medication instructions when you have your preoperative evaluation. Do not wear nail polish the day of your surgery and if you take Phentermine you need to stop this medication ONE WEEK prior to your surgery. If you take Invokana, Farxiga, Jardiance, or Steglatro) - Hold 72 hours before the procedure.  If you take Ozempic,  Mounjaro, Bydureon or Trulicity do not take for 8 days before your surgery. If you take Victoza, Rybelsis, Saxenda or Adlyxi stop 24 hours before the procedure.  Please arrive at the hospital 2 hours before procedure if scheduled at 9:30 or later in the day or at the time the nurse tells you at your preoperative visit.   If you have my chart do not use the time given in my chart use the time given to you by the nurse during your preoperative visit.   Your surgery  time may change. Please be available for phone calls the day of your surgery and the day before. The Short Stay department may need to discuss changes about your surgery time. Not reaching the you could lead to procedure delays and possible cancellation.  You must have a ride home and someone to stay with you for 24 to 48 hours. The person taking you home will receive and sign for the your discharge instructions.  Please be prepared to give your support person's name and telephone number to Central Registration. Dr Margrette will need that name and phone number post procedure.

## 2024-05-10 NOTE — Progress Notes (Signed)
 Patient ID: [name], female   DOB: 1956-08-23, 67 y.o.   MRN: [id number]  ASSESSMENT AND PLAN  68 year old female acute fracture right distal radius status post closed reduction in the emergency room with incomplete reduction  Patient will undergo open treatment internal fixation right wrist  History of previous wrist fracture requiring ORIF  Patient has had bone density test in the past results will be checked later  Patient is a smoker for 36 years  The procedure has been fully reviewed with the patient; The risks and benefits of surgery have been discussed and explained and understood. Alternative treatment has also been reviewed, questions were encouraged and answered. The postoperative plan is also been reviewed.   Chief Complaint  Patient presents with   Fracture    R wrist DOI 05/08/24     HPI [name] is a 68 y.o. female.   68 year old female who was washing her dog the dog jumped out of the basin and she slipped in the water  and fractured her right wrist.  She went to the ER she had an x-ray that showed a displaced distal radius fracture.  Attempted closed reduction was performed in the ER and she was splinted  Her splint is a little tight her fingers are little swollen but she has no neurovascular compromise  She is here for evaluation and treatment   Review of Systems Review of Systems Main positive review of system is shortness of breath occasionally secondary to chronic smoking history.   Past Medical History:  Diagnosis Date   Chronic constipation    Cirrhosis (HCC)    child pugh class A and MELD 5, completed hep A and B vaccine series, no HCC on 3/16 MRI   Colonic inertia    Depression    Diverticula of colon    Diverticulosis 01/31/2008   colonoscopy Dr Shaaron   GERD (gastroesophageal reflux disease)    Hyperplastic colon polyp    Liver mass    Macular degeneration    Obesity    PUD (peptic ulcer disease) 1988   Schizoaffective  disorder    Mental Health-Dr Dekle   Seizures (HCC)    as child; no medsd and has had no seizures since then.   Wrist fracture    Left wrist s/p fall and surgical repair    Past Surgical History:  Procedure Laterality Date   ABDOMINAL HYSTERECTOMY     CHOLECYSTECTOMY  08/2010   cholelithiasis; Dr Mavis   COLONOSCOPY  01/31/2008   Dr. Shaaron- normal rectum, L sided diverticula, long tortuous colon. hyperplasitic  polyp.   COLONOSCOPY WITH PROPOFOL  N/A 10/19/2017    single 4 mm polyp in the sigmoid colon which was sessile, scattered small mouth diverticula in the sigmoid and descending colon, otherwise normal.  Surgical pathology found the polyp to be hyperplastic.  Recommended 10-year repeat colonoscopy.   ERCP W/ SPHINCTEROTOMY AND BALLOON DILATION  08/05/2010   normal ampulla/mild erythema in the gastric remnant without ulcerations or erosions. normal retroflexed view of the cardia/anastomosis normal/distal common bile duct stones s/p extraction and shincterotomy   ESOPHAGOGASTRODUODENOSCOPY N/A 10/24/2013   MFM:Wnmfjo esophagus. Surgically altered stomach. Abnormal gastric mucosa  -  status post biopsy (reactive gastrophathy)   ESOPHAGOGASTRODUODENOSCOPY (EGD) WITH PROPOFOL  N/A 11/30/2015   LA grade A esophagitis, no varices, surveillance in 2019   ESOPHAGOGASTRODUODENOSCOPY (EGD) WITH PROPOFOL  N/A 10/19/2017   erosive reflux esophagitis status post dilation, surgically altered stomach status post gastric bypass with a single patent apparent  small bowel limb, small hiatal hernia.   ESOPHAGOGASTRODUODENOSCOPY (EGD) WITH PROPOFOL  N/A 07/16/2021   normal esophagus, no varices, portal gastropathy. 3 year screening   EUS N/A 08/20/2015   Dr. Teressa: well-circumscribed 5.2 cm chronic mass in porta hepatis, internally mass is gelatinous. Small distal esophageal varices (limited view), Bilroth 1 type anatomy, cytology NEGATIVE for malignant cells. Suspicion for neoplasm low    FOOT SURGERY      left   GASTRIC BYPASS     with Revision, Dr. Darien STAI DILATION N/A 10/19/2017   Procedure: STAI DILATION;  Surgeon: Shaaron Lamar HERO, MD;  Location: [facility];  Service: Endoscopy;  Laterality: N/A;   POLYPECTOMY  10/19/2017   Procedure: POLYPECTOMY;  Surgeon: Shaaron Lamar HERO, MD;  Location: [facility];  Service: Endoscopy;;  sigmoid colon (CS)    REDUCTION MAMMAPLASTY Bilateral    S/P Hysterectomy     partial   TONSILLECTOMY     WRIST SURGERY Left 05/2016      Physical Exam   2 The patient is well developed well nourished and well groomed. 3 Orientation to person place and time is normal  4 Mood is pleasant.  5 Ambulatory status patient ambulated in the office normally  6 Inspection of the right wrist reveals moderate tenderness   moderate swelling without deformity 7 Range of motion assessment: The range of motion is diminished primarily secondary to pain 8 Stability tests are deferred because of pain but the x-ray shows no subluxation of the joint 9 Strength assessment muscle tone is normal resistance testing is deferred because of pain and swelling  10 Nerve function normal radial ulnar and median nerve 11 Vascular function or capillary refill pulse normal 12 Local lymphatic system trochlear negative  Opposite extremity  there is no alignment abnormality, no contracture, no subluxation, no atrophy and neurovascular exam is intact skin incision volar aspect from prior surgery  MEDICAL DECISION MAKING  A.  Encounter Diagnoses  Name Primary?   Pre-op exam    Closed Colles' fracture of right radius, initial encounter Yes    B. DATA ANALYSED:  ER visit and outside imaging  IMAGING: Independent interpretation of images: Image #1 dorsally displaced classic Colles' fracture  Image #2 improved alignment but still apex volar angulation distal radius  Orders: Surgery  Outside records reviewed: Yes  C. MANAGEMENT surgical  Note splint was  changed  No orders of the defined types were placed in this encounter.

## 2024-05-13 ENCOUNTER — Other Ambulatory Visit: Payer: Self-pay

## 2024-05-13 ENCOUNTER — Encounter (HOSPITAL_COMMUNITY)
Admission: RE | Admit: 2024-05-13 | Discharge: 2024-05-13 | Disposition: A | Discharge status: Home / Self Care | Source: Ambulatory Visit | Attending: Orthopedic Surgery

## 2024-05-13 ENCOUNTER — Encounter (HOSPITAL_COMMUNITY): Payer: Self-pay

## 2024-05-13 VITALS — BP 135/56 | HR 81 | Temp 97.5°F | Resp 22 | Ht 64.0 in | Wt 205.5 lb

## 2024-05-13 DIAGNOSIS — X58XXXA Exposure to other specified factors, initial encounter: Secondary | ICD-10-CM | POA: Insufficient documentation

## 2024-05-13 DIAGNOSIS — K7460 Unspecified cirrhosis of liver: Secondary | ICD-10-CM | POA: Diagnosis not present

## 2024-05-13 DIAGNOSIS — S52531A Colles' fracture of right radius, initial encounter for closed fracture: Secondary | ICD-10-CM | POA: Insufficient documentation

## 2024-05-13 DIAGNOSIS — Z01818 Encounter for other preprocedural examination: Secondary | ICD-10-CM

## 2024-05-13 DIAGNOSIS — Z01812 Encounter for preprocedural laboratory examination: Secondary | ICD-10-CM | POA: Diagnosis present

## 2024-05-13 LAB — COMPREHENSIVE METABOLIC PANEL WITH GFR
ALT: 12 U/L (ref 0–44)
AST: 14 U/L — ABNORMAL LOW (ref 15–41)
Albumin: 3.6 g/dL (ref 3.5–5.0)
Alkaline Phosphatase: 88 U/L (ref 38–126)
Anion gap: 8 (ref 5–15)
BUN: 13 mg/dL (ref 8–23)
CO2: 24 mmol/L (ref 22–32)
Calcium: 8.5 mg/dL — ABNORMAL LOW (ref 8.9–10.3)
Chloride: 104 mmol/L (ref 98–111)
Creatinine, Ser: 0.97 mg/dL (ref 0.44–1.00)
GFR, Estimated: >60 mL/min (ref >60)
Glucose, Bld: 106 mg/dL — ABNORMAL HIGH (ref 70–99)
Potassium: 3.8 mmol/L (ref 3.5–5.1)
Sodium: 136 mmol/L (ref 135–145)
Total Bilirubin: 0.7 mg/dL (ref 0.0–1.2)
Total Protein: 6.5 g/dL (ref 6.5–8.1)

## 2024-05-13 LAB — PROTIME-INR
INR: 1 (ref 0.8–1.2)
Prothrombin Time: 13.6 s (ref 11.4–15.2)

## 2024-05-13 NOTE — Patient Instructions (Signed)
 [name]  05/13/2024     @PREFPERIOPPHARMACY @   Your procedure is scheduled on  05/15/2024.   Report to [facility] at  1000 A.M.   Call this number if you have problems the morning of surgery:  [phone]  If you experience any cold or flu symptoms such as cough, fever, chills, shortness of breath, etc. between now and your scheduled surgery, please notify us  at the above number.   Remember:  Do not eat after midnight.   You may drink clear liquids until 0800 am on 05/15/2024.    Clear liquids allowed are:                    Water , Juice (No red color; non-citric and without pulp; diabetics please choose diet or no sugar options), Carbonated beverages (diabetics please choose diet or no sugar options), Clear Tea (No creamer, milk, or cream, including half & half and powdered creamer), Black Coffee Only (No creamer, milk or cream, including half & half and powdered creamer), and Clear Sports drink (No red color; diabetics please choose diet or no sugar options)           Use your inhalers before you come and bring your rescue inhaler with you.    Take these medicines the morning of surgery with A SIP OF WATER        fluoxetine , hydrocodone (if needed), methocarbamol  (if needed), pantoprazole , zofran  (if needed).    Do not wear jewelry, make-up or nail polish, including gel polish,  artificial nails, or any other type of covering on natural nails (fingers and  toes).  Do not wear lotions, powders, or perfumes, or deodorant.  Do not shave 48 hours prior to surgery.  Men may shave face and neck.  Do not bring valuables to the hospital.  [facility] is not responsible for any belongings or valuables.  Contacts, dentures or bridgework may not be worn into surgery.  Leave your suitcase in the car.  After surgery it may be brought to your room.  For patients admitted to the hospital, discharge time will be determined by your treatment team.  Patients  discharged the day of surgery will not be allowed to drive home and must have someone with them for 24 hours.    Special instructions:   DO NOT smoke tobacco or vape for 24 hours before your procedure.  Please read over the following fact sheets that you were given. Pain Booklet, Coughing and Deep Breathing, Surgical Site Infection Prevention, Anesthesia Post-op Instructions, and Care and Recovery After Surgery      ORIF Surgery for a Broken Wrist: What to Know After After an ORIF surgery for a broken wrist, you may have pain, swelling, bruising, and stiffness. You may also see a small amount of fluid or blood in your bandage. ORIF is s a type of surgery that is used to repair a break in a bone and keep the bones stable. Follow these instructions at home:  Medicines Take your medicines only as told. You may need to take steps to help treat or prevent trouble pooping (constipation), such as: Taking medicines to help you poop. Eating foods high in fiber, like beans, whole grains, and fresh fruits and vegetables. Drinking more fluids as told. Ask your provider if it's safe to drive or use machines while taking your medicine. If you have a splint and compression bandage: Do not remove the splint, fluffy gauze,  and bandage. Your provider will usually remove this on your first follow-up visit. If the bandage feels too tight and causes a lot of pain, you can loosen it as told. Rewrap it again so that it feels snug but not painful. Keep the splint and bandage clean and dry. If you have a cast: Do not put pressure on any part of the cast until it's hard. This may take a few hours. Do not stick anything inside it to scratch your skin. Doing this can lead to infection. Check the skin around the cast every day. Tell your provider if you see problems. It's OK to put lotion on dry skin around the cast. Keep the cast clean and dry. If you have a sling: Wear the sling as told. Take it off only if  your provider says you can. Check the skin around it every day. Tell your provider if you see problems. Loosen the sling if your fingers tingle, are numb, or turn cold and blue. Keep the sling clean and dry. Bathing Do not take baths, swim, or use a hot tub until you're told it is OK. Ask if you can shower. If you have a splint and compression bandage, or a cast that is not waterproof: Do not let it get wet. Cover it when you take a bath or shower. Use a cover that doesn't let any water  in. Incision care  After your first visit, take care of your cut from surgery as told. Make sure you: Wash your hands with soap and water  for at least 20 seconds before and after you change your bandage. If you can't use soap and water , use hand sanitizer. Change your bandage. Leave stitches or skin glue alone. Leave tape strips alone unless you're told to take them off. You may trim the edges of the tape strips if they curl up. Check the area around your cut from surgery every day for signs of infection. Check for: More redness, swelling, or pain. More fluid or blood. Warmth. Pus or a bad smell. Managing pain, stiffness, and swelling  Use ice or an ice pack as told. If you have a sling that you can take off, remove it only as told. Place a towel between your skin and the ice or between your cast and the ice. Leave the ice on for 20 minutes, 2-3 times a day. If your skin turns bright red, take off the ice right away to prevent skin damage. The risk of damage is higher if you can't feel pain, heat, or cold. Move your fingers often to reduce stiffness and swelling. Raise your wrist above the level of your heart while you're sitting or lying down. Use pillows as needed. Activity Rest as told. Ask what things are safe for you to do at home. Ask when you can go back to work or school. Ask your provider when it's safe to drive. Ask if it's OK for you to lift. Avoid pulling and pushing. Exercise as told.  You will work with a physical therapist for a few months. General instructions Do not smoke, vape or use nicotine or tobacco. Keep all follow-up visits. Your provider needs to make sure your wrist is healing correctly. Contact a health care provider if: You have any signs of infection. You have a fever or chills. Your pain is very bad and medicine is not helping. Your cast is damaged. Your cast feels too tight or too loose. Your arm feels cold or numb. Your skin or fingers  on your injured arm turn blue or gray. If you can't reach your provider, go to an urgent care or emergency room. Get help right away if: You have trouble breathing. You feel faint or light-headed. These symptoms may be an emergency. Call 911 right away. Do not wait to see if the symptoms will go away. Do not drive yourself to the hospital. This information is not intended to replace advice given to you by your health care provider. Make sure you discuss any questions you have with your health care provider. Document Revised: 04/17/2023 Document Reviewed: 04/17/2023 Elsevier Patient Education  2024 [employer]General Anesthesia, Adult, Care After The following information offers guidance on how to care for yourself after your procedure. Your health care provider may also give you more specific instructions. If you have problems or questions, contact your health care provider. What can I expect after the procedure? After the procedure, it is common for people to: Have pain or discomfort at the IV site. Have nausea or vomiting. Have a sore throat or hoarseness. Have trouble concentrating. Feel cold or chills. Feel weak, sleepy, or tired (fatigue). Have soreness and body aches. These can affect parts of the body that were not involved in surgery. Follow these instructions at home: For the time period you were told by your health care provider:  Rest. Do not participate in activities where you could fall or become  injured. Do not drive or use machinery. Do not drink alcohol. Do not take sleeping pills or medicines that cause drowsiness. Do not make important decisions or sign legal documents. Do not take care of children on your own. General instructions Drink enough fluid to keep your urine pale yellow. If you have sleep apnea, surgery and certain medicines can increase your risk for breathing problems. Follow instructions from your health care provider about wearing your sleep device: Anytime you are sleeping, including during daytime naps. While taking prescription pain medicines, sleeping medicines, or medicines that make you drowsy. Return to your normal activities as told by your health care provider. Ask your health care provider what activities are safe for you. Take over-the-counter and prescription medicines only as told by your health care provider. Do not use any products that contain nicotine or tobacco. These products include cigarettes, chewing tobacco, and vaping devices, such as e-cigarettes. These can delay incision healing after surgery. If you need help quitting, ask your health care provider. Contact a health care provider if: You have nausea or vomiting that does not get better with medicine. You vomit every time you eat or drink. You have pain that does not get better with medicine. You cannot urinate or have bloody urine. You develop a skin rash. You have a fever. Get help right away if: You have trouble breathing. You have chest pain. You vomit blood. These symptoms may be an emergency. Get help right away. Call 911. Do not wait to see if the symptoms will go away. Do not drive yourself to the hospital. Summary After the procedure, it is common to have a sore throat, hoarseness, nausea, vomiting, or to feel weak, sleepy, or fatigue. For the time period you were told by your health care provider, do not drive or use machinery. Get help right away if you have difficulty  breathing, have chest pain, or vomit blood. These symptoms may be an emergency. This information is not intended to replace advice given to you by your health care provider. Make sure you discuss any questions you have  with your health care provider. Document Revised: 11/19/2021 Document Reviewed: 11/19/2021 Elsevier Patient Education  2024 [employer] How to Use Chlorhexidine  Prepackaged Cloths at Home Chlorhexidine  gluconate (CHG) is a germ-killing (antiseptic) wash that is used to clean the skin. It can get rid of the germs that normally live on the skin and can keep them away for about 24 hours. One way to clean your skin with CHG is by using prepackaged CHG cloths. The following information gives steps on how to use CHG cloths to clean your skin. Supplies needed: CHG cloths. How to clean your whole body with CHG cloths Follow the order of these steps unless your health care provider gives you other instructions: Use a new CHG cloth for each part of your body and after each step. Wash between any folds of skin, under your breasts, and between your fingers and toes. Front of neck, shoulders, and chest. Do not get CHG in your eyes, mouth, and ears. If CHG gets into your eyes or ears, rinse them well with water . Both arms, armpits, and hands. Stomach and groin. Right leg and foot. Left leg and foot. Back of neck and back. Do not rinse. Let your skin air dry. Do not put anything on your body afterward, such as powder or perfume. Use lotion only as told by your provider. How to clean your surgery area with CHG cloths Follow these steps when using CHG cloths to clean before surgery unless your provider gives you other instructions: Using the CHG cloths, vigorously wash the part of your body where the surgery will be done. Use a back-and-forth motion for 2 minutes. The skin should be completely wet with CHG when you are done. Do not put anything on your body afterward, such as powder, lotion,  or perfume. Put on clean clothes or pajamas. If it is the night before surgery, sleep in clean sheets. General tips Only use CHG cloths as told, and follow the instructions on the label. Use the CHG cloths on clean, dry skin. Do not smoke and stay away from flames after using CHG. Your skin may feel sticky after using the CHG. This is normal. The sticky feeling will go away as the CHG dries. Do not use CHG: If you have a chlorhexidine  allergy or have reacted to chlorhexidine  in the past. On babies younger than 73 months of age. On your head, face, or genitals unless your provider tells you to. Contact a health care provider if: You have questions about using CHG. Your skin gets irritated or itchy. You have a rash after using CHG. You swallow any CHG. Call your local poison control center ([phone] in the [place] Your eyes itch badly, or they become very red or swollen. Your hearing changes. You have trouble seeing. Get help right away if: You have swelling or tingling in your mouth or throat. You make high-pitched whistling sounds when you breathe, most often when you breathe out (wheeze). You have trouble breathing. These symptoms may be an emergency. Call 911right away. Do not wait to see if the symptoms will go away. Do not drive yourself to the hospital. This information is not intended to replace advice given to you by your health care provider. Make sure you discuss any questions you have with your health care provider. Document Revised: 03/07/2023 Document Reviewed: 03/03/2022 Elsevier Patient Education  2024 Else[employer] Inc.How to Use an Incentive Spirometer An incentive spirometer is a tool that measures how well you are filling your lungs with each  breath. Learning to take long, deep breaths using this tool can help you keep your lungs clear and active. This may help to reverse or lessen your chance of developing breathing (pulmonary) problems, especially infection. You may  be asked to use a spirometer: After a surgery. If you have a lung problem or a history of smoking. After a long period of time when you have been unable to move or be active. If the spirometer includes an indicator to show the highest number that you have reached, your health care provider or respiratory therapist will help you set a goal. Keep a log of your progress as told by your health care provider. What are the risks? Breathing too quickly may cause dizziness or cause you to pass out. Take your time so you do not get dizzy or light-headed. If you are in pain, you may need to take pain medicine before doing incentive spirometry. It is harder to take a deep breath if you are having pain. How to use your incentive spirometer  Sit up on the edge of your bed or on a chair. Hold the incentive spirometer so that it is in an upright position. Before you use the spirometer, breathe out normally. Place the mouthpiece in your mouth. Make sure your lips are closed tightly around it. Breathe in slowly and as deeply as you can through your mouth, causing the piston or the ball to rise toward the top of the chamber. Hold your breath for 3-5 seconds, or for as long as possible. If the spirometer includes a coach indicator, use this to guide you in breathing. Slow down your breathing if the indicator goes above the marked areas. Remove the mouthpiece from your mouth and breathe out normally. The piston or ball will return to the bottom of the chamber. Rest for a few seconds, then repeat the steps 10 or more times. Take your time and take a few normal breaths between deep breaths so that you do not get dizzy or light-headed. Do this every 1-2 hours when you are awake. If the spirometer includes a goal marker to show the highest number you have reached (best effort), use this as a goal to work toward during each repetition. After each set of 10 deep breaths, cough a few times. This will help to make sure  that your lungs are clear. If you have an incision on your chest or abdomen from surgery, place a pillow or a rolled-up towel firmly against the incision when you cough. This can help to reduce pain while taking deep breaths and coughing. General tips When you are able to get out of bed: Walk around often. Continue to take deep breaths and cough in order to clear your lungs. Keep using the incentive spirometer until your health care provider says it is okay to stop using it. If you have been in the hospital, you may be told to keep using the spirometer at home. Contact a health care provider if: You are having difficulty using the spirometer. You have trouble using the spirometer as often as instructed. Your pain medicine is not giving enough relief for you to use the spirometer as told. You have a fever. Get help right away if: You develop shortness of breath. You develop a cough with bloody mucus from the lungs. You have fluid or blood coming from an incision site after you cough. Summary An incentive spirometer is a tool that can help you learn to take long, deep breaths to  keep your lungs clear and active. You may be asked to use a spirometer after a surgery, if you have a lung problem or a history of smoking, or if you have been inactive for a long period of time. Use your incentive spirometer as instructed every 1-2 hours while you are awake. If you have an incision on your chest or abdomen, place a pillow or a rolled-up towel firmly against your incision when you cough. This will help to reduce pain. Get help right away if you have shortness of breath, you cough up bloody mucus, or blood comes from your incision when you cough. This information is not intended to replace advice given to you by your health care provider. Make sure you discuss any questions you have with your health care provider. Document Revised: 06/30/2023 Document Reviewed: 06/30/2023 Elsevier Patient Education   2024 [employer].

## 2024-05-15 ENCOUNTER — Other Ambulatory Visit: Payer: Self-pay

## 2024-05-15 ENCOUNTER — Ambulatory Visit (HOSPITAL_COMMUNITY): Admitting: Anesthesiology

## 2024-05-15 ENCOUNTER — Encounter (HOSPITAL_COMMUNITY)
Admission: RE | Disposition: A | Discharge status: Home / Self Care | Payer: Self-pay | Source: Home / Self Care | Attending: Orthopedic Surgery

## 2024-05-15 ENCOUNTER — Ambulatory Visit (HOSPITAL_COMMUNITY)
Admission: RE | Admit: 2024-05-15 | Discharge: 2024-05-15 | Disposition: A | Discharge status: Home / Self Care | Attending: Orthopedic Surgery | Admitting: Orthopedic Surgery

## 2024-05-15 ENCOUNTER — Ambulatory Visit (HOSPITAL_COMMUNITY)

## 2024-05-15 ENCOUNTER — Encounter (HOSPITAL_COMMUNITY): Payer: Self-pay | Admitting: Orthopedic Surgery

## 2024-05-15 DIAGNOSIS — S62101D Fracture of unspecified carpal bone, right wrist, subsequent encounter for fracture with routine healing: Secondary | ICD-10-CM

## 2024-05-15 DIAGNOSIS — F17200 Nicotine dependence, unspecified, uncomplicated: Secondary | ICD-10-CM | POA: Diagnosis not present

## 2024-05-15 DIAGNOSIS — Z6835 Body mass index (BMI) 35.0-35.9, adult: Secondary | ICD-10-CM

## 2024-05-15 DIAGNOSIS — W010XXA Fall on same level from slipping, tripping and stumbling without subsequent striking against object, initial encounter: Secondary | ICD-10-CM | POA: Diagnosis not present

## 2024-05-15 DIAGNOSIS — Z8711 Personal history of peptic ulcer disease: Secondary | ICD-10-CM | POA: Insufficient documentation

## 2024-05-15 DIAGNOSIS — S52531A Colles' fracture of right radius, initial encounter for closed fracture: Secondary | ICD-10-CM

## 2024-05-15 DIAGNOSIS — Z01818 Encounter for other preprocedural examination: Secondary | ICD-10-CM

## 2024-05-15 DIAGNOSIS — R001 Bradycardia, unspecified: Secondary | ICD-10-CM | POA: Diagnosis not present

## 2024-05-15 DIAGNOSIS — S52551A Other extraarticular fracture of lower end of right radius, initial encounter for closed fracture: Secondary | ICD-10-CM | POA: Diagnosis present

## 2024-05-15 DIAGNOSIS — F32A Depression, unspecified: Secondary | ICD-10-CM | POA: Diagnosis not present

## 2024-05-15 HISTORY — PX: OPEN REDUCTION INTERNAL FIXATION (ORIF) DISTAL RADIAL FRACTURE: SHX5989

## 2024-05-15 LAB — CBC
HCT: 44.1 % (ref 36.0–46.0)
Hemoglobin: 14.2 g/dL (ref 12.0–15.0)
MCH: 30.5 pg (ref 26.0–34.0)
MCHC: 32.2 g/dL (ref 30.0–36.0)
MCV: 94.8 fL (ref 80.0–100.0)
Platelets: 347 K/uL (ref 150–400)
RBC: 4.65 MIL/uL (ref 3.87–5.11)
RDW: 14.7 % (ref 11.5–15.5)
WBC: 11 K/uL — ABNORMAL HIGH (ref 4.0–10.5)
nRBC: 0 % (ref 0.0–0.2)

## 2024-05-15 LAB — CBC WITH DIFFERENTIAL/PLATELET

## 2024-05-15 SURGERY — OPEN REDUCTION INTERNAL FIXATION (ORIF) DISTAL RADIUS FRACTURE
Anesthesia: General | Site: Hand | Laterality: Right

## 2024-05-15 MED ORDER — PROMETHAZINE HCL 12.5 MG PO TABS: 12.5000 mg | ORAL_TABLET | Freq: Four times a day (QID) | ORAL | 0 refills | Status: AC | PRN

## 2024-05-15 MED ORDER — LACTATED RINGERS IV SOLN
INTRAVENOUS | Status: DC
Start: 2024-05-15 — End: 2024-05-15

## 2024-05-15 MED ORDER — CHLORHEXIDINE GLUCONATE 0.12 % MT SOLN
OROMUCOSAL | Status: AC
  Administered 2024-05-15: 15 mL via OROMUCOSAL
  Filled 2024-05-15: qty 15

## 2024-05-15 MED ORDER — IPRATROPIUM-ALBUTEROL 0.5-2.5 (3) MG/3ML IN SOLN
RESPIRATORY_TRACT | Status: AC
  Administered 2024-05-15: 3 mL via RESPIRATORY_TRACT
  Filled 2024-05-15: qty 3

## 2024-05-15 MED ORDER — ORAL CARE MOUTH RINSE: 15.0000 mL | Freq: Once | OROMUCOSAL | Status: AC

## 2024-05-15 MED ORDER — SUGAMMADEX SODIUM 200 MG/2ML IV SOLN
INTRAVENOUS | Status: DC | PRN
  Administered 2024-05-15: 200 mg via INTRAVENOUS

## 2024-05-15 MED ORDER — ROCURONIUM BROMIDE 10 MG/ML (PF) SYRINGE
PREFILLED_SYRINGE | INTRAVENOUS | Status: DC | PRN
  Administered 2024-05-15: 50 mg via INTRAVENOUS

## 2024-05-15 MED ORDER — PROPOFOL 10 MG/ML IV BOLUS
INTRAVENOUS | Status: AC
  Filled 2024-05-15: qty 20

## 2024-05-15 MED ORDER — SODIUM CHLORIDE 0.9 % IR SOLN
Status: DC | PRN
  Administered 2024-05-15: 1000 mL

## 2024-05-15 MED ORDER — BUPIVACAINE-EPINEPHRINE (PF) 0.5% -1:200000 IJ SOLN
INTRAMUSCULAR | Status: AC
Start: 2024-05-15 — End: 2024-05-15
  Filled 2024-05-15: qty 30

## 2024-05-15 MED ORDER — FENTANYL CITRATE (PF) 100 MCG/2ML IJ SOLN
INTRAMUSCULAR | Status: AC
  Filled 2024-05-15: qty 2

## 2024-05-15 MED ORDER — ORAL CARE MOUTH RINSE: 15.0000 mL | Freq: Once | OROMUCOSAL | Status: DC

## 2024-05-15 MED ORDER — CHLORHEXIDINE GLUCONATE 0.12 % MT SOLN: 15.0000 mL | Freq: Once | OROMUCOSAL | Status: DC

## 2024-05-15 MED ORDER — IBUPROFEN 800 MG PO TABS: 800.0000 mg | ORAL_TABLET | Freq: Three times a day (TID) | ORAL | 1 refills | Status: DC | PRN

## 2024-05-15 MED ORDER — IPRATROPIUM-ALBUTEROL 0.5-2.5 (3) MG/3ML IN SOLN: 3.0000 mL | Freq: Once | RESPIRATORY_TRACT | Status: AC

## 2024-05-15 MED ORDER — BUPIVACAINE-EPINEPHRINE (PF) 0.5% -1:200000 IJ SOLN
INTRAMUSCULAR | Status: DC | PRN
  Administered 2024-05-15: 20 mL via PERINEURAL

## 2024-05-15 MED ORDER — CEFAZOLIN SODIUM-DEXTROSE 2-4 GM/100ML-% IV SOLN: 2.0000 g | INTRAVENOUS | Status: AC

## 2024-05-15 MED ORDER — LIDOCAINE 2% (20 MG/ML) 5 ML SYRINGE
INTRAMUSCULAR | Status: AC
  Filled 2024-05-15: qty 5

## 2024-05-15 MED ORDER — DEXMEDETOMIDINE HCL IN NACL 80 MCG/20ML IV SOLN
INTRAVENOUS | Status: DC | PRN
  Administered 2024-05-15: 8 ug via INTRAVENOUS

## 2024-05-15 MED ORDER — LIDOCAINE 2% (20 MG/ML) 5 ML SYRINGE
INTRAMUSCULAR | Status: DC | PRN
  Administered 2024-05-15: 100 mg via INTRAVENOUS

## 2024-05-15 MED ORDER — OXYCODONE HCL 5 MG PO TABS: 5.0000 mg | ORAL_TABLET | ORAL | 0 refills | Status: AC | PRN

## 2024-05-15 MED ORDER — IPRATROPIUM-ALBUTEROL 0.5-2.5 (3) MG/3ML IN SOLN
RESPIRATORY_TRACT | Status: AC
  Filled 2024-05-15: qty 3

## 2024-05-15 MED ORDER — HYDROMORPHONE HCL 1 MG/ML IJ SOLN
INTRAMUSCULAR | Status: AC
  Filled 2024-05-15: qty 0.5

## 2024-05-15 MED ORDER — CEFAZOLIN SODIUM-DEXTROSE 2-4 GM/100ML-% IV SOLN
INTRAVENOUS | Status: AC
  Administered 2024-05-15: 2 g via INTRAVENOUS
  Filled 2024-05-15: qty 100

## 2024-05-15 MED ORDER — FENTANYL CITRATE PF 50 MCG/ML IJ SOSY: 25.0000 ug | PREFILLED_SYRINGE | INTRAMUSCULAR | Status: DC | PRN

## 2024-05-15 MED ORDER — ALBUTEROL SULFATE HFA 108 (90 BASE) MCG/ACT IN AERS
INHALATION_SPRAY | RESPIRATORY_TRACT | Status: AC
Start: 2024-05-15 — End: 2024-05-15
  Filled 2024-05-15: qty 6.7

## 2024-05-15 MED ORDER — FENTANYL CITRATE (PF) 100 MCG/2ML IJ SOLN
INTRAMUSCULAR | Status: DC | PRN
  Administered 2024-05-15 (×2): 50 ug via INTRAVENOUS
  Administered 2024-05-15 (×2): 25 ug via INTRAVENOUS
  Administered 2024-05-15: 50 ug via INTRAVENOUS

## 2024-05-15 MED ORDER — IPRATROPIUM-ALBUTEROL 0.5-2.5 (3) MG/3ML IN SOLN
3.0000 mL | Freq: Once | RESPIRATORY_TRACT | Status: AC
  Administered 2024-05-15: 3 mL via RESPIRATORY_TRACT
  Filled 2024-05-15: qty 3

## 2024-05-15 MED ORDER — PROPOFOL 10 MG/ML IV BOLUS
INTRAVENOUS | Status: DC | PRN
  Administered 2024-05-15: 120 mg via INTRAVENOUS
  Administered 2024-05-15: 30 mg via INTRAVENOUS

## 2024-05-15 MED ORDER — OXYCODONE HCL 5 MG/5ML PO SOLN: 5.0000 mg | Freq: Once | ORAL | Status: DC | PRN

## 2024-05-15 MED ORDER — HYDROMORPHONE HCL 1 MG/ML IJ SOLN
INTRAMUSCULAR | Status: DC | PRN
  Administered 2024-05-15: .5 mg via INTRAVENOUS

## 2024-05-15 MED ORDER — OXYCODONE HCL 5 MG PO TABS: 5.0000 mg | ORAL_TABLET | Freq: Once | ORAL | Status: DC | PRN

## 2024-05-15 MED ORDER — IPRATROPIUM-ALBUTEROL 0.5-2.5 (3) MG/3ML IN SOLN
3.0000 mL | Freq: Once | RESPIRATORY_TRACT | Status: AC
  Administered 2024-05-15: 3 mL via RESPIRATORY_TRACT

## 2024-05-15 MED ORDER — ALBUTEROL SULFATE HFA 108 (90 BASE) MCG/ACT IN AERS
INHALATION_SPRAY | RESPIRATORY_TRACT | Status: DC | PRN
  Administered 2024-05-15: 6 via RESPIRATORY_TRACT

## 2024-05-15 MED ORDER — ONDANSETRON HCL 4 MG/2ML IJ SOLN
INTRAMUSCULAR | Status: DC | PRN
  Administered 2024-05-15: 4 mg via INTRAVENOUS

## 2024-05-15 MED ORDER — DEXAMETHASONE SODIUM PHOSPHATE 10 MG/ML IJ SOLN
INTRAMUSCULAR | Status: DC | PRN
  Administered 2024-05-15: 10 mg via INTRAVENOUS

## 2024-05-15 MED ORDER — CHLORHEXIDINE GLUCONATE 0.12 % MT SOLN: 15.0000 mL | Freq: Once | OROMUCOSAL | Status: AC

## 2024-05-15 SURGICAL SUPPLY — 48 items
BANDAGE ESMARK 4X12 BL STRL LF (DISPOSABLE) ×1 IMPLANT
BENZOIN TINCTURE PRP APPL 2/3 (GAUZE/BANDAGES/DRESSINGS) IMPLANT
BIT DRILL 1.7 (BIT) IMPLANT
BIT DRILL 2.5 CANN REUSE (DRILL) IMPLANT
BLADE SURG SZ10 CARB STEEL (BLADE) IMPLANT
BNDG COHESIVE 4X5 TAN STRL (GAUZE/BANDAGES/DRESSINGS) ×1 IMPLANT
BNDG ELASTIC 3X5.8 VLCR NS LF (GAUZE/BANDAGES/DRESSINGS) ×2 IMPLANT
CHLORAPREP W/TINT 26 (MISCELLANEOUS) ×1 IMPLANT
CLOTH BEACON ORANGE TIMEOUT ST (SAFETY) ×1 IMPLANT
COVER LIGHT HANDLE STERIS (MISCELLANEOUS) ×2 IMPLANT
CUFF TOURN SGL QUICK 18X4 (TOURNIQUET CUFF) ×1 IMPLANT
DRAPE C-ARM FOLDED MOBILE STRL (DRAPES) ×1 IMPLANT
ELECTRODE REM PT RTRN 9FT ADLT (ELECTROSURGICAL) ×1 IMPLANT
GAUZE SPONGE 4X4 12PLY STRL (GAUZE/BANDAGES/DRESSINGS) ×1 IMPLANT
GAUZE XEROFORM 5X9 LF (GAUZE/BANDAGES/DRESSINGS) IMPLANT
GLOVE BIOGEL PI IND STRL 7.0 (GLOVE) ×2 IMPLANT
GLOVE BIOGEL PI IND STRL 8.5 (GLOVE) ×1 IMPLANT
GLOVE SKINSENSE STRL SZ8.0 LF (GLOVE) ×1 IMPLANT
GOWN STRL REUS W/TWL LRG LVL3 (GOWN DISPOSABLE) ×2 IMPLANT
GOWN STRL REUS W/TWL XL LVL3 (GOWN DISPOSABLE) ×1 IMPLANT
GUIDEWIRE 1.35MM (WIRE) IMPLANT
KIT TURNOVER KIT A (KITS) ×1 IMPLANT
MANIFOLD NEPTUNE II (INSTRUMENTS) ×1 IMPLANT
NDL HYPO 21X1.5 SAFETY (NEEDLE) ×1 IMPLANT
NEEDLE HYPO 21X1.5 SAFETY (NEEDLE) ×1 IMPLANT
NS IRRIG 1000ML POUR BTL (IV SOLUTION) ×1 IMPLANT
PACK BASIC LIMB (CUSTOM PROCEDURE TRAY) ×1 IMPLANT
PAD ARMBOARD POSITIONER FOAM (MISCELLANEOUS) ×1 IMPLANT
PADDING CAST ABS COTTON 4X4 ST (CAST SUPPLIES) IMPLANT
PLATE NARROW RIGHT (Plate) IMPLANT
POSITIONER HEAD 8X9X4 ADT (SOFTGOODS) ×1 IMPLANT
SCREW CORTEX LP 2.4X20 (Screw) IMPLANT
SCREW CORTICAL 2.4X24 (Screw) IMPLANT
SCREW KREULOCK 2.4X20 (Screw) IMPLANT
SCREW LO-PRO TI 3.5X16MM (Screw) IMPLANT
SCREW LOCK 18X2.4XVA NS LF TI (Screw) IMPLANT
SCREW LOCK COMPR 2.4X16 (Screw) IMPLANT
SCREW LOCKING COMPRESS 2.4X12 (Screw) IMPLANT
SCREW LOCKING COMPRESS 2.4X14 (Screw) IMPLANT
SCREW LP 3.5X12MM (Screw) IMPLANT
SCREW LP TI 3.5X14MM (Screw) IMPLANT
SCREW NLOCK 2.4X22 (Screw) IMPLANT
SET BASIN LINEN APH (SET/KITS/TRAYS/PACK) ×1 IMPLANT
SLEEVE DRILL 1.7 GUIDE (IRRIGATION / IRRIGATOR) IMPLANT
STAPLER VISISTAT (STAPLE) IMPLANT
STRIP CLOSURE SKIN 1/2X4 (GAUZE/BANDAGES/DRESSINGS) IMPLANT
SUT MON AB 2-0 SH27 (SUTURE) ×1 IMPLANT
SYR CONTROL 10ML LL (SYRINGE) ×1 IMPLANT

## 2024-05-15 NOTE — H&P (Signed)
 Patient ID: [name], female   DOB: 02/16/1956, 68 y.o.   MRN: [id number]   ASSESSMENT AND PLAN   68 year old female acute fracture right distal radius status post closed reduction in the emergency room with incomplete reduction   Patient will undergo open treatment internal fixation right wrist   History of previous wrist fracture requiring ORIF   Patient has had bone density test in the past results will be checked later   Patient is a smoker for 36 years   The procedure has been fully reviewed with the patient; The risks and benefits of surgery have been discussed and explained and understood. Alternative treatment has also been reviewed, questions were encouraged and answered. The postoperative plan is also been reviewed.         Chief Complaint  Patient presents with   Fracture      R wrist DOI 05/08/24        HPI [name] is a 68 y.o. female.   68 year old female who was washing her dog the dog jumped out of the basin and she slipped in the water  and fractured her right wrist.  She went to the ER she had an x-ray that showed a displaced distal radius fracture.  Attempted closed reduction was performed in the ER and she was splinted   Her splint is a little tight her fingers are little swollen but she has no neurovascular compromise   She is here for evaluation and treatment     Review of Systems Review of Systems Main positive review of system is shortness of breath occasionally secondary to chronic smoking history.         Past Medical History:  Diagnosis Date   Chronic constipation     Cirrhosis (HCC)      child pugh class A and MELD 5, completed hep A and B vaccine series, no HCC on 3/16 MRI   Colonic inertia     Depression     Diverticula of colon     Diverticulosis 01/31/2008    colonoscopy Dr Shaaron   GERD (gastroesophageal reflux disease)     Hyperplastic colon polyp     Liver mass     Macular degeneration     Obesity     PUD (peptic  ulcer disease) 1988   Schizoaffective disorder      Mental Health-Dr Dekle   Seizures (HCC)      as child; no medsd and has had no seizures since then.   Wrist fracture      Left wrist s/p fall and surgical repair               Past Surgical History:  Procedure Laterality Date   ABDOMINAL HYSTERECTOMY       CHOLECYSTECTOMY   08/2010    cholelithiasis; Dr Mavis   COLONOSCOPY   01/31/2008    Dr. Shaaron- normal rectum, L sided diverticula, long tortuous colon. hyperplasitic  polyp.   COLONOSCOPY WITH PROPOFOL  N/A 10/19/2017     single 4 mm polyp in the sigmoid colon which was sessile, scattered small mouth diverticula in the sigmoid and descending colon, otherwise normal.  Surgical pathology found the polyp to be hyperplastic.  Recommended 10-year repeat colonoscopy.   ERCP W/ SPHINCTEROTOMY AND BALLOON DILATION   08/05/2010    normal ampulla/mild erythema in the gastric remnant without ulcerations or erosions. normal retroflexed view of the cardia/anastomosis normal/distal common bile duct stones s/p extraction and shincterotomy   ESOPHAGOGASTRODUODENOSCOPY N/A  10/24/2013    MFM:Wnmfjo esophagus. Surgically altered stomach. Abnormal gastric mucosa  -  status post biopsy (reactive gastrophathy)   ESOPHAGOGASTRODUODENOSCOPY (EGD) WITH PROPOFOL  N/A 11/30/2015    LA grade A esophagitis, no varices, surveillance in 2019   ESOPHAGOGASTRODUODENOSCOPY (EGD) WITH PROPOFOL  N/A 10/19/2017    erosive reflux esophagitis status post dilation, surgically altered stomach status post gastric bypass with a single patent apparent small bowel limb, small hiatal hernia.   ESOPHAGOGASTRODUODENOSCOPY (EGD) WITH PROPOFOL  N/A 07/16/2021    normal esophagus, no varices, portal gastropathy. 3 year screening   EUS N/A 08/20/2015    Dr. Teressa: well-circumscribed 5.2 cm chronic mass in porta hepatis, internally mass is gelatinous. Small distal esophageal varices (limited view), Bilroth 1 type anatomy, cytology  NEGATIVE for malignant cells. Suspicion for neoplasm low    FOOT SURGERY        left   GASTRIC BYPASS        with Revision, Dr. Darien STAI DILATION N/A 10/19/2017    Procedure: STAI DILATION;  Surgeon: Shaaron Lamar HERO, MD;  Location: [facility];  Service: Endoscopy;  Laterality: N/A;   POLYPECTOMY   10/19/2017    Procedure: POLYPECTOMY;  Surgeon: Shaaron Lamar HERO, MD;  Location: [facility];  Service: Endoscopy;;  sigmoid colon (CS)     REDUCTION MAMMAPLASTY Bilateral     S/P Hysterectomy        partial   TONSILLECTOMY       WRIST SURGERY Left 05/2016              Physical Exam     2 The patient is well developed well nourished and well groomed. 3 Orientation to person place and time is normal  4 Mood is pleasant.   5 Ambulatory status patient ambulated in the office normally   6 Inspection of the right wrist reveals moderate tenderness   moderate swelling without deformity 7 Range of motion assessment: The range of motion is diminished primarily secondary to pain 8 Stability tests are deferred because of pain but the x-ray shows no subluxation of the joint 9 Strength assessment muscle tone is normal resistance testing is deferred because of pain and swelling   10 Nerve function normal radial ulnar and median nerve 11 Vascular function or capillary refill pulse normal 12 Local lymphatic system trochlear negative   Opposite extremity  there is no alignment abnormality, no contracture, no subluxation, no atrophy and neurovascular exam is intact skin incision volar aspect from prior surgery   MEDICAL DECISION MAKING   A.      Encounter Diagnoses  Name Primary?   Pre-op exam     Closed Colles' fracture of right radius, initial encounter Yes      B. DATA ANALYSED:   ER visit and outside imaging  IMAGING: Independent interpretation of images: Image #1 dorsally displaced classic Colles' fracture   Image #2 improved alignment but still apex volar  angulation distal radius  Orders: Surgery  Outside records reviewed: Yes  C. MANAGEMENT surgical   Note splint was changed

## 2024-05-15 NOTE — Anesthesia Procedure Notes (Signed)
 Procedure Name: Intubation Date/Time: 05/15/2024 12:52 PM  Performed by: Cordella Elvie HERO, CRNAPre-anesthesia Checklist: Patient identified, Emergency Drugs available, Suction available, Patient being monitored and Timeout performed Patient Re-evaluated:Patient Re-evaluated prior to induction Oxygen Delivery Method: Circle system utilized Preoxygenation: Pre-oxygenation with 100% oxygen Induction Type: IV induction Ventilation: Mask ventilation without difficulty Laryngoscope Size: Mac and 3 Tube type: Oral Tube size: 7.0 mm Number of attempts: 1 Airway Equipment and Method: Stylet Placement Confirmation: ETT inserted through vocal cords under direct vision, positive ETCO2, CO2 detector and breath sounds checked- equal and bilateral Secured at: 21 cm Tube secured with: Tape Dental Injury: Teeth and Oropharynx as per pre-operative assessment

## 2024-05-15 NOTE — Interval H&P Note (Signed)
 History and Physical Interval Note:  05/15/2024 12:39 PM  [name]  has presented today for surgery, with the diagnosis of fracture right wrist.  The various methods of treatment have been discussed with the patient and family. After consideration of risks, benefits and other options for treatment, the patient has consented to  Procedure(s): OPEN REDUCTION INTERNAL FIXATION (ORIF) DISTAL RADIUS FRACTURE (Right) as a surgical intervention.  The patient's history has been reviewed, patient examined, no change in status, stable for surgery.  I have reviewed the patient's chart and labs.  Questions were answered to the patient's satisfaction.     [name]

## 2024-05-15 NOTE — Anesthesia Preprocedure Evaluation (Addendum)
 Anesthesia Evaluation  Patient identified by MRN, date of birth, ID band Patient awake    Reviewed: Allergy & Precautions, H&P , NPO status , Patient's Chart, lab work & pertinent test results  Airway Mallampati: III  TM Distance: >3 FB Neck ROM: Full    Dental no notable dental hx.    Pulmonary shortness of breath, Current Smoker and Patient abstained from smoking.   + rhonchi  + wheezing      Cardiovascular negative cardio ROS Normal cardiovascular exam Rhythm:Regular Rate:Normal     Neuro/Psych Seizures -,  PSYCHIATRIC DISORDERS  Depression  Schizophrenia     GI/Hepatic Neg liver ROS, PUD,GERD  ,,  Endo/Other  negative endocrine ROS    Renal/GU negative Renal ROS  negative genitourinary   Musculoskeletal negative musculoskeletal ROS (+)    Abdominal   Peds negative pediatric ROS (+)  Hematology negative hematology ROS (+)   Anesthesia Other Findings   Reproductive/Obstetrics negative OB ROS                              Anesthesia Physical Anesthesia Plan  ASA: 3  Anesthesia Plan: General   Post-op Pain Management:    Induction: Intravenous  PONV Risk Score and Plan:   Airway Management Planned: Oral ETT  Additional Equipment:   Intra-op Plan:   Post-operative Plan: Extubation in OR  Informed Consent: I have reviewed the patients History and Physical, chart, labs and discussed the procedure including the risks, benefits and alternatives for the proposed anesthesia with the patient or authorized representative who has indicated his/her understanding and acceptance.     Dental advisory given  Plan Discussed with: CRNA  Anesthesia Plan Comments:          Anesthesia Quick Evaluation

## 2024-05-15 NOTE — Op Note (Signed)
 Orthopaedic Surgery Operative Note (CSN: [id number])  [name]  1955/10/14 Date of Surgery: 05/15/2024   Diagnoses:  fracture right wrist/colles fracture   Procedure: Orif distal radius    No TXA   Operative Finding 2 part distal radius fracture extra-articular involving the right wrist   Post-Op Diagnosis: Same Surgeons:Primary: Margrette Taft BRAVO, MD Assistants: Kristi Free Location: [facility] OR ROOM 4 Anesthesia: General Antibiotics: Ancef  2 g Tourniquet time: See anesthesia record Estimated Blood Loss: Minimal Complications: None Specimens: None   Implants: Implant Name Type Inv. Item Serial No. Manufacturer Lot No. LRB No. Used Action  PLATE NARROW RIGHT - [id number] Plate PLATE NARROW RIGHT  [employer] STERILE ON SET Right 1 Implanted  SCREW LOCKING COMPRESS 2.4X12 - [id number] Screw SCREW LOCKING COMPRESS 2.4X12  [employer]  Right 1 Implanted  SCREW LOCKING COMPRESS 2.4X14 - [id number] Screw SCREW LOCKING COMPRESS 2.4X14  [employer] STERILE ON SET Right 1 Implanted  SCREW LOCK 18X2.4XVA NS LF TI - [id number] Screw SCREW LOCK 18X2.4XVA NS LF TI  [employer] STERILE ON SET Right 1 Implanted  SCREW KREULOCK 2.4X20 - [id number] Screw SCREW KREULOCK 2.4X20  [employer] STERILE ON SET Right 1 Implanted    BLOOD ADMINISTERED:none  DRAINS: none   LOCAL MEDICATIONS USED:  marcaine  with epi    SPECIMEN:  No Specimen  DISPOSITION OF SPECIMEN:  N/A  COUNTS:  CORRECT   DICTATION: .Dragon Dictation   Surgeon Margrette  The patient was taken to the recovery room in stable condition  PLAN OF CARE: Discharge to home after PACU  PATIENT DISPOSITION:  PACU - hemodynamically stable.   Delay start of Pharmacological VTE agent (>24hrs) due to surgical blood loss or risk of bleeding: Not applicable   Surgical details  The patient was seen in the preop area reevaluated cleared for surgery I confirm the right wrist of the surgical site marked with my initials.   Chart review and x-ray review was performed.  Implants were checked prior to bring the patient back to the operating room  The patient was brought back to the operating room for general anesthesia she was in the supine position We placed a tourniquet on her right arm we prepped her from fingertips to the area of the tourniquet  We completed the timeout The only difference was that the antibiotic had been given at 1030 I did not give another dose of antibiotics I will give 1 postop   We exsanguinated the limb with the  Esmarch we elevated the tourniquet up to 250 mmHg  The incision was made on the radial border of the hand with a zigzag incision towards the wrist.  The subcutaneous tissue was divided the flexor carpi radialis tendon sheath was divided the flexor carpi radialis tendon was retracted ulnarly.  The floor of the flexor carpi radialis was opened exposing the flexor houses longus which was retracted radially.  This exposed the pronator quadratus which was ruptured.  The fracture was encountered.  It was opened and irrigated.  A periosteal elevator was placed into the fracture site it was levered volarly and reduced impelled with a K wire.  Radiographs confirmed reduction.  The plate was then placed carefully over the distal radius up to the watershed line.  It was adjusted several times using x-ray and K wire to ensure screws were not placed into the joint  Once this was accomplished a holding cortical screw was placed in the oblong hole.  We then placed several distal screws  the first 2 were nonlocking the remaining screws were locking.  We placed 2 additional cortical screws.  The reduction was held nicely with the plate and screws radiographs confirmed improved alignment  The wound was irrigated and closed with 2-0 Monocryl suture in interrupted fashion  Marcaine  with epinephrine  was placed along the wound edges and also dorsally over the fracture site  Sterile dressings were  applied along with a volar splint  Tourniquet was released prior to application of the splint.  Good color and capillary refill return to the hand  Patient was explained taken recovery in stable condition  Postop plan  Wound check in 7 days At the first postop visit patient was placed in a short arm cast Cast will stay on for 4 weeks followed by x-rays then brace for 4 weeks

## 2024-05-15 NOTE — Transfer of Care (Signed)
 Immediate Anesthesia Transfer of Care Note  Patient: [name]  Procedure(s) Performed: OPEN REDUCTION INTERNAL FIXATION (ORIF) DISTAL RADIUS FRACTURE (Right: Hand)  Patient Location: PACU  Anesthesia Type:General  Level of Consciousness: awake  Airway & Oxygen Therapy: Patient Spontanous Breathing and Patient connected to face mask oxygen  Post-op Assessment: Report given to RN and Post -op Vital signs reviewed and stable  Post vital signs: Reviewed and stable  Last Vitals:  Vitals Value Taken Time  BP 122/55   Temp 98.5   Pulse 80 05/15/24 14:38  Resp 32 05/15/24 14:38  SpO2 99 % 05/15/24 14:38  Vitals shown include unfiled device data.  Last Pain:  Vitals:   05/15/24 1012  TempSrc: Oral  PainSc: 9       Patients Stated Pain Goal: 4 (05/15/24 1012)  Complications: No notable events documented.

## 2024-05-16 ENCOUNTER — Encounter: Payer: Self-pay | Admitting: Gastroenterology

## 2024-05-16 LAB — DIFFERENTIAL
Abs Immature Granulocytes: 0.06 K/uL (ref 0.00–0.07)
Basophils Absolute: 0.1 K/uL (ref 0.0–0.1)
Basophils Relative: 1 %
Eosinophils Absolute: 0.1 K/uL (ref 0.0–0.5)
Eosinophils Relative: 1 %
Immature Granulocytes: 1 %
Lymphocytes Relative: 15 %
Lymphs Abs: 1.6 K/uL (ref 0.7–4.0)
Monocytes Absolute: 0.9 K/uL (ref 0.1–1.0)
Monocytes Relative: 8 %
Neutro Abs: 8.2 K/uL — ABNORMAL HIGH (ref 1.7–7.7)
Neutrophils Relative %: 74 %

## 2024-05-16 NOTE — Anesthesia Postprocedure Evaluation (Signed)
 Anesthesia Post Note  Patient: [name]  Procedure(s) Performed: OPEN REDUCTION INTERNAL FIXATION (ORIF) DISTAL RADIUS FRACTURE (Right: Hand)  Patient location during evaluation: PACU Anesthesia Type: General Level of consciousness: awake and alert Pain management: pain level controlled Vital Signs Assessment: post-procedure vital signs reviewed and stable Respiratory status: spontaneous breathing, nonlabored ventilation, respiratory function stable and patient connected to nasal cannula oxygen Cardiovascular status: blood pressure returned to baseline and stable Postop Assessment: no apparent nausea or vomiting Anesthetic complications: no   No notable events documented.   Last Vitals:  Vitals:   05/15/24 1500 05/15/24 1523  BP: 126/60 (!) 120/51  Pulse: 76 71  Resp: (!) 24 14  Temp:  36.9 C  SpO2: 97% 95%    Last Pain:  Vitals:   05/15/24 1523  TempSrc: Oral  PainSc: 0-No pain                 Andrea Limes

## 2024-05-17 ENCOUNTER — Ambulatory Visit: Payer: Self-pay | Admitting: Internal Medicine

## 2024-05-17 ENCOUNTER — Encounter (HOSPITAL_COMMUNITY): Payer: Self-pay | Admitting: Orthopedic Surgery

## 2024-05-22 ENCOUNTER — Encounter: Admitting: Orthopedic Surgery

## 2024-05-22 DIAGNOSIS — S52531A Colles' fracture of right radius, initial encounter for closed fracture: Secondary | ICD-10-CM | POA: Insufficient documentation

## 2024-05-23 ENCOUNTER — Ambulatory Visit: Admitting: Orthopedic Surgery

## 2024-05-23 DIAGNOSIS — S52531D Colles' fracture of right radius, subsequent encounter for closed fracture with routine healing: Secondary | ICD-10-CM

## 2024-05-23 DIAGNOSIS — G8918 Other acute postprocedural pain: Secondary | ICD-10-CM

## 2024-05-23 MED ORDER — HYDROCODONE-ACETAMINOPHEN 10-325 MG PO TABS: 1.0000 | ORAL_TABLET | Freq: Four times a day (QID) | ORAL | 0 refills | Status: DC | PRN

## 2024-05-23 MED ORDER — IBUPROFEN 800 MG PO TABS: 800.0000 mg | ORAL_TABLET | Freq: Three times a day (TID) | ORAL | 1 refills | Status: AC | PRN

## 2024-05-23 NOTE — Patient Instructions (Signed)
 Remove the dressing after 48 hours.  Cover the wound with a Band-Aid  For showering in 2 days you can take a shower with a glove and Saran wrap around the wound.  Do not let the wound get wet  Wear the brace full-time including sleeping you can remove it for bathing and showering

## 2024-05-23 NOTE — Progress Notes (Signed)
 Postop appointment #1  Postop day #8   Chief Complaint  Patient presents with   Post-op Follow-up    Right wrist     Encounter Diagnoses  Name Primary?   Closed Colles' fracture of right radius with routine healing, subsequent encounter ORIF 05/15/24 Yes   Acute post-operative pain    [name] is doing well still having some pain  Her wound looks good  She has almost full flexion of her fingers and I showed her how to exercise her hand  I placed her in a brace and put a dressing over the wound  She can shower in 48 hours with the Saran wrap and glove over the hand and wound  Next visit  X-ray Meds ordered this encounter  Medications   HYDROcodone -acetaminophen  (NORCO) 10-325 MG tablet    Sig: Take 1 tablet by mouth every 6 (six) hours as needed.    Dispense:  30 tablet    Refill:  0   ibuprofen  (ADVIL ) 800 MG tablet    Sig: Take 1 tablet (800 mg total) by mouth every 8 (eight) hours as needed.    Dispense:  90 tablet    Refill:  1

## 2024-05-23 NOTE — Progress Notes (Signed)
   There were no vitals taken for this visit.  There is no height or weight on file to calculate BMI.  Chief Complaint  Patient presents with   Post-op Follow-up    Right wrist     Encounter Diagnosis  Name Primary?   Closed Colles' fracture of right radius with routine healing, subsequent encounter ORIF 05/15/24 Yes    What pharmacy do you use ? _____[facility]______________________  DOI/DOS/ Date:    Improved

## 2024-06-04 ENCOUNTER — Telehealth: Payer: Self-pay | Admitting: Orthopedic Surgery

## 2024-06-04 NOTE — Telephone Encounter (Signed)
 I asked her to come in for me to see if we have something else for her She is coming this afternoon after 130   FYI only

## 2024-06-04 NOTE — Telephone Encounter (Signed)
 Dr. Areatha pt - spoke w/the pt, [name] stated that [name] cannot wear the brace [name] was given because it hurts.  [name] would like recommendations on what to do.  [phone]

## 2024-06-06 ENCOUNTER — Encounter (INDEPENDENT_AMBULATORY_CARE_PROVIDER_SITE_OTHER): Payer: Self-pay | Admitting: *Deleted

## 2024-06-12 ENCOUNTER — Other Ambulatory Visit: Payer: Self-pay

## 2024-06-12 ENCOUNTER — Encounter: Payer: Self-pay | Admitting: Orthopedic Surgery

## 2024-06-12 ENCOUNTER — Ambulatory Visit: Admitting: Orthopedic Surgery

## 2024-06-12 DIAGNOSIS — G8918 Other acute postprocedural pain: Secondary | ICD-10-CM

## 2024-06-12 DIAGNOSIS — S52531D Colles' fracture of right radius, subsequent encounter for closed fracture with routine healing: Secondary | ICD-10-CM

## 2024-06-12 MED ORDER — HYDROCODONE-ACETAMINOPHEN 10-325 MG PO TABS: 1.0000 | ORAL_TABLET | Freq: Four times a day (QID) | ORAL | 0 refills | Status: DC | PRN

## 2024-06-12 NOTE — Progress Notes (Signed)
     06/12/2024   Chief Complaint  Patient presents with   Wrist Injury    Right     Encounter Diagnoses  Name Primary?   Closed Colles' fracture of right radius with routine healing, subsequent encounter ORIF 05/15/24 Yes   Acute post-operative pain     What pharmacy do you use ? _____[facility]______  DOI/DOS/ Date: 05/15/24  Improved  Verneda has some stiffness in her wrist and hand especially the flexor tendons  Her x-ray shows the fracture is stable but some of the screws are loose.  Exam shows no irritation of the flexor tendon so decision was made to keep the plate in until the fracture heals and then arrange plate removal after her next visit  Patient is to start physical therapy to improve hand range of motion  She was still having some pain and was continued on hydrocodone   Meds ordered this encounter  Medications   HYDROcodone -acetaminophen  (NORCO) 10-325 MG tablet    Sig: Take 1 tablet by mouth every 6 (six) hours as needed.    Dispense:  30 tablet    Refill:  0

## 2024-06-12 NOTE — Progress Notes (Signed)
    06/12/2024   Chief Complaint  Patient presents with   Wrist Injury    Right     Encounter Diagnosis  Name Primary?   Closed Colles' fracture of right radius with routine healing, subsequent encounter ORIF 05/15/24 Yes    What pharmacy do you use ? _____[facility]______  DOI/DOS/ Date: 05/15/24  Improved

## 2024-06-12 NOTE — Patient Instructions (Signed)
 Physical therapy has been ordered for you at [facility]. They should call you to schedule, [phone] is the phone number to call, if you want to call to schedule.

## 2024-06-21 ENCOUNTER — Encounter (HOSPITAL_COMMUNITY): Payer: Self-pay | Admitting: Occupational Therapy

## 2024-06-21 ENCOUNTER — Ambulatory Visit (HOSPITAL_COMMUNITY): Attending: Orthopedic Surgery | Admitting: Occupational Therapy

## 2024-06-21 ENCOUNTER — Other Ambulatory Visit: Payer: Self-pay

## 2024-06-21 DIAGNOSIS — S52531D Colles' fracture of right radius, subsequent encounter for closed fracture with routine healing: Secondary | ICD-10-CM | POA: Insufficient documentation

## 2024-06-21 DIAGNOSIS — M25631 Stiffness of right wrist, not elsewhere classified: Secondary | ICD-10-CM | POA: Insufficient documentation

## 2024-06-21 DIAGNOSIS — M25531 Pain in right wrist: Secondary | ICD-10-CM | POA: Insufficient documentation

## 2024-06-21 DIAGNOSIS — G8918 Other acute postprocedural pain: Secondary | ICD-10-CM | POA: Diagnosis not present

## 2024-06-21 DIAGNOSIS — R29898 Other symptoms and signs involving the musculoskeletal system: Secondary | ICD-10-CM | POA: Diagnosis present

## 2024-06-21 NOTE — Patient Instructions (Signed)
 Complete each exercise 10-15X, 2-3X/day  1) Towel crunch Place a small towel on a firm table top. Flatten out the towel and then place your hand on one end of it.  Next, flex your fingers 2-5 (index finger through pinky finger) as you pull the towel towards your hand.    2) Digit composite flexion/adduction (make a fist) Hold your hand up as shown. Open and close your hand into a fist and repeat. If you cannot make a full fist, then make a partial fist.    3) Thumb/finger opposition Touch the tip of the thumb to each fingertip one by one. Extend fingers fully after they are touched.      4) Finger Taps Start with the hand flat and fingers slightly spread.  One at a time, starting with the thumb, lift each finger up separately.     5) Digit Abduction/Adduction Hold hand palm down flat on table. Spread your fingers apart and back together.     6) Wrist Flexion  Start with wrist at edge of table, palm facing up. With wrist hanging slightly off table, curl wrist upward, and back down.      7) Wrist Extension  Start with wrist at edge of table, palm facing down. With wrist slightly off the edge of the table, curl wrist up and back down.      8) Radial Deviations  Start with forearm flat against a table, wrist hanging slightly off the edge, and palm facing the wall. Bending at the wrist only, and keeping palm facing the wall, bend wrist so fist is pointing towards the floor, back up to start position, and up towards the ceiling. Return to start.

## 2024-06-21 NOTE — Therapy (Signed)
 OUTPATIENT OCCUPATIONAL THERAPY ORTHO EVALUATION  Patient Name: [name] MRN: [id number] DOB:June 11, 1956, 67 y.o., female Today's Date: 06/21/2024    END OF SESSION:  OT End of Session - 06/21/24 1242     Visit Number 1    Number of Visits 16    Date for Recertification  08/20/24   progress note 07/21/24   Authorization Type 1) [employer] Medicare Dual Complete 2) [place] Medicaid    Authorization Time Period No auth required    Progress Note Due on Visit 10    OT Start Time 1153    OT Stop Time 1220    OT Time Calculation (min) 27 min    Activity Tolerance Patient tolerated treatment well    Behavior During Therapy WFL for tasks assessed/performed          Past Medical History:  Diagnosis Date   Chronic constipation    Cirrhosis (HCC)    child pugh class A and MELD 5, completed hep A and B vaccine series, no HCC on 3/16 MRI   Colonic inertia    Depression    Diverticula of colon    Diverticulosis 01/31/2008   colonoscopy Dr Shaaron   GERD (gastroesophageal reflux disease)    Hyperplastic colon polyp    Liver mass    Macular degeneration    Obesity    PUD (peptic ulcer disease) 1988   Schizoaffective disorder    Mental Health-Dr Dekle   Seizures (HCC)    as child; no medsd and has had no seizures since then.   Wrist fracture    Left wrist s/p fall and surgical repair   Past Surgical History:  Procedure Laterality Date   ABDOMINAL HYSTERECTOMY     CHOLECYSTECTOMY  08/2010   cholelithiasis; Dr Mavis   COLONOSCOPY  01/31/2008   Dr. Shaaron- normal rectum, L sided diverticula, long tortuous colon. hyperplasitic  polyp.   COLONOSCOPY WITH PROPOFOL  N/A 10/19/2017    single 4 mm polyp in the sigmoid colon which was sessile, scattered small mouth diverticula in the sigmoid and descending colon, otherwise normal.  Surgical pathology found the polyp to be hyperplastic.  Recommended 10-year repeat colonoscopy.   ERCP W/ SPHINCTEROTOMY AND BALLOON DILATION  08/05/2010    normal ampulla/mild erythema in the gastric remnant without ulcerations or erosions. normal retroflexed view of the cardia/anastomosis normal/distal common bile duct stones s/p extraction and shincterotomy   ESOPHAGOGASTRODUODENOSCOPY N/A 10/24/2013   MFM:Wnmfjo esophagus. Surgically altered stomach. Abnormal gastric mucosa  -  status post biopsy (reactive gastrophathy)   ESOPHAGOGASTRODUODENOSCOPY (EGD) WITH PROPOFOL  N/A 11/30/2015   LA grade A esophagitis, no varices, surveillance in 2019   ESOPHAGOGASTRODUODENOSCOPY (EGD) WITH PROPOFOL  N/A 10/19/2017   erosive reflux esophagitis status post dilation, surgically altered stomach status post gastric bypass with a single patent apparent small bowel limb, small hiatal hernia.   ESOPHAGOGASTRODUODENOSCOPY (EGD) WITH PROPOFOL  N/A 07/16/2021   normal esophagus, no varices, portal gastropathy. 3 year screening   EUS N/A 08/20/2015   Dr. Teressa: well-circumscribed 5.2 cm chronic mass in porta hepatis, internally mass is gelatinous. Small distal esophageal varices (limited view), Bilroth 1 type anatomy, cytology NEGATIVE for malignant cells. Suspicion for neoplasm low    FOOT SURGERY     left   GASTRIC BYPASS     with Revision, Dr. Darien STAI DILATION N/A 10/19/2017   Procedure: STAI DILATION;  Surgeon: Shaaron Lamar HERO, MD;  Location: [facility];  Service: Endoscopy;  Laterality: N/A;   OPEN REDUCTION INTERNAL  FIXATION (ORIF) DISTAL RADIAL FRACTURE Right 05/15/2024   Procedure: OPEN REDUCTION INTERNAL FIXATION (ORIF) DISTAL RADIUS FRACTURE;  Surgeon: Margrette Taft BRAVO, MD;  Location: [facility];  Service: Orthopedics;  Laterality: Right;   POLYPECTOMY  10/19/2017   Procedure: POLYPECTOMY;  Surgeon: Shaaron Lamar HERO, MD;  Location: [facility];  Service: Endoscopy;;  sigmoid colon (CS)    REDUCTION MAMMAPLASTY Bilateral    S/P Hysterectomy     partial   TONSILLECTOMY     WRIST SURGERY Left 05/2016   Patient Active Problem List    Diagnosis Date Noted   Fracture, Colles, right, closed 05/22/2024   Bradycardia 04/18/2024   Bilateral sensorineural hearing loss 04/10/2024   Acute hypoxemic respiratory failure (HCC) 10/16/2023   History of thromboembolism 03/02/2021   Tobacco dependence due to cigarettes 03/02/2021   Lung nodule 12/29/2020   Diarrhea 09/17/2020   Acute deep vein thrombosis (DVT) of right lower extremity (HCC) 09/08/2020   Venous embolism 09/08/2020   Cough 03/24/2020   Dyspnea 03/24/2020   Insomnia 03/24/2020   Encounter for screening colonoscopy 09/20/2017   GERD (gastroesophageal reflux disease) 09/15/2016   Mucosal abnormality of stomach    Nausea with vomiting 11/04/2015   Abdominal pain, epigastric 09/17/2015   Peritonitis (HCC) 08/27/2015   Pain following surgery or procedure 08/22/2015   Abdominal pain 08/01/2015   Nausea 08/01/2015   Abdominal mass 08/01/2015   Schizoaffective disorder (HCC) 05/15/2014   Hepatic cirrhosis (HCC) 10/18/2013   LLQ pain 08/13/2013   DEPRESSION 04/08/2008   PUD 04/08/2008   DIVERTICULOSIS, COLON 04/08/2008   Constipation 04/08/2008   MORBID OBESITY, HX OF 04/08/2008   PCP: Drenda Bibles, FNP  REFERRING PROVIDER: Dr. Taft Margrette  ONSET DATE: 05/15/24  REFERRING DIAG:  D47.468I (ICD-10-CM) - Closed Colles' fracture of right radius with routine healing, subsequent encounter  G89.18 (ICD-10-CM) - Acute post-operative pain    THERAPY DIAG:  Pain in right wrist  Stiffness of right wrist, not elsewhere classified  Other symptoms and signs involving the musculoskeletal system  Rationale for Evaluation and Treatment: Rehabilitation  SUBJECTIVE:   SUBJECTIVE STATEMENT: S: It hurts all the time Pt accompanied by: self  PERTINENT HISTORY: Pt is a 68 y/o female s/p ORIF for right colles fx on 05/15/24. Pt sustained fracture when trying to give her dog a bath and slipped when he jumped out of the tub. Pt presents in pre-fabricated wrist brace  and sling. Rarely removes the brace.   PRECAUTIONS: Other: See [place]  Handbook Protocol   WEIGHT BEARING RESTRICTIONS: Yes NWB  PAIN:  Are you having pain? Yes: NPRS scale: 7/10 Pain location: right wrist and hand, thumb Pain description: just hurts Aggravating factors: using it Relieving factors: pain medication  FALLS: Has patient fallen in last 6 months? Yes. Number of falls 1  PLOF: Independent  PATIENT GOALS: To have less pain and more mobility.   NEXT MD VISIT: 07/10/24  OBJECTIVE:  Note: Objective measures were completed at Evaluation unless otherwise noted.  HAND DOMINANCE: Right  ADLs: Pt is not using the right hand/wrist for any ADLs. Requires assistance for housework, cooking, cleaning. Pt reports she is completing dressing, bathing, grooming tasks herself, but has pain and difficulty.   FUNCTIONAL OUTCOME MEASURES: Quick Dash:  QUICK DASH  Please rate your ability do the following activities in the last week by selecting the number below the appropriate response.   Activities Rating  Open a tight or new jar.  5 = Unable  Do heavy household chores (  e.g., wash walls, floors). 5 = Unable  Carry a shopping bag or briefcase 3 = Moderate difficulty  Wash your back. 5 = Unable  Use a knife to cut food. 5 = Unable  Recreational activities in which you take some force or impact through your arm, shoulder or hand (e.g., golf, hammering, tennis, etc.). 5 = Unable  During the past week, to what extent has your arm, shoulder or hand problem interfered with your normal social activities with family, friends, neighbors or groups?  4 = Quite a bit  During the past week, were you limited in your work or other regular daily activities as a result of your arm, shoulder or hand problem? 4 = Very limited  Rate the severity of the following symptoms in the last week: Arm, Shoulder, or hand pain. 3 = Moderate  Rate the severity of the following symptoms in the last week: Tingling  (pins and needles) in your arm, shoulder or hand. 5 = Extreme  During the past week, how much difficulty have you had sleeping because of the pain in your arm, shoulder or hand?  4 = Severe difficulty   (A QuickDASH score may not be calculated if there is greater than 1 missing item.)  Quick Dash Disability/Symptom Score: [(sum of 48 (n) responses/11 (n)] -1x 25 = 84.09  Minimally Clinically Important Difference (MCID): 15-20 points  (Franchignoni, F. et al. (2013). Minimally clinically important difference of the disabilities of the arm, shoulder, and hand outcome measures (DASH) and its shortened version (Quick DASH). Journal of Orthopaedic & Sports Physical Therapy, 44(1), 30-39)    UPPER EXTREMITY ROM:       Assessed seated at tabletop in gravity eliminated position  Active ROM Right eval  Wrist flexion 22  Wrist extension 18  Wrist ulnar deviation 10  Wrist radial deviation 11  Wrist pronation 90  Wrist supination 90  (Blank rows = not tested)  Active ROM Right eval  Thumb MCP (0-60) 28  Thumb IP (0-80) 30  Thumb Radial abd/add (0-55)   Thumb Palmar abd/add (0-45)   Thumb Opposition to Small Finger 1cm  Index MCP (0-90) 34  Index PIP (0-100) 60  Index DIP (0-70)  40  Long MCP (0-90)  42  Long PIP (0-100)  88  Long DIP (0-70)  46  Ring MCP (0-90)  36  Ring PIP (0-100)  58  Ring DIP (0-70)  38  Little MCP (0-90)  38  Little PIP (0-100)  60  Little DIP (0-70)  42  (Blank rows = not tested)   UPPER EXTREMITY MMT:       Unable to test on evaluation due to pain/protocol  MMT Right eval  Wrist flexion   Wrist extension   Wrist ulnar deviation   Wrist radial deviation   Wrist pronation   Wrist supination   (Blank rows = not tested)  HAND FUNCTION: Grip strength: Right: 0 lbs; Left: 46 lbs, Lateral pinch: Right: 1 lbs, Left: 9 lbs, and 3 point pinch: Right: 0 lbs, Left: 9 lbs  SENSATION: Reports numbness in the thumb but feels like the thumb is going to  explode  EDEMA: Minimal to none  COGNITION: Overall cognitive status: Within functional limits for tasks assessed  OBSERVATIONS: Pt reports she took 2 pain pills prior to session. Pt with slow responses, glassy eyes, laying table.    TREATMENT DATE:  PATIENT EDUCATION: Education details: Development worker, international aid A/ROM Person educated: Patient Education method: Explanation, Demonstration, and Handouts Education comprehension: verbalized understanding and returned demonstration  HOME EXERCISE PROGRAM: Eval:  Finger and wrist A/ROM   GOALS: Goals reviewed with patient? Yes  SHORT TERM GOALS: Target date: 07/21/24  Pt will be provided with and educated on HEP to improve mobility of right wrist and digits required for use during ADL tasks.   Goal status: INITIAL  2.  Pt will increase A/ROM of right wrist by 20+ degrees to improve mobility required for participation in dressing, bathing, and grooming tasks.   Goal status: INITIAL  3.  Pt will register right grip strength of at least 10# and pinch strength of at least 3# to improve ability to participate in simple meal preparation using the RUE.   Goal status: INITIAL  4.  Pt will increase digit ROM required for a full grasp, to improve ability to grasp and hold objects such as cups, coins, etc.   Goal status: INITIAL    LONG TERM GOALS: Target date: 08/20/24  Pt will decrease pain in right wrist and hand to 4/10 or less to improve ability to sleep for 2+ consecutive hours without waking due to pain.   Goal status: INITIAL  2.  Pt will increase right wrist A/ROM by 30+ degrees to improve ability to perform housework tasks using the RUE as dominant.   Goal status: INITIAL  3.  Pt will increase right wrist strength to 4/5 or greater to improve ability to use RUE for meal preparation with lifting pots and  pans.   Goal status: INITIAL  4.  Pt will increase right grip strength to at least 30# and grip strength to at least 8# to improve ability to grasp and hold objects during housework and yardwork.   Goal status: INITIAL    ASSESSMENT:  CLINICAL IMPRESSION: Patient is a 68 y.o. female who was seen today for occupational therapy evaluation for right colles fracture and ORIF on 05/15/24. Pt presents with pain, decreased ROM, strength, and functional use of the RUE.  Pt with pre-fabricated wrist brace on, has not removed except for bathing and has been putting a bag over her arm to bathe. OT educated on washing the hand and arm now that the stitches are out and incision is closed. Pt washed arm during evaluation. Pt educated on HEP this session, educated on POC.   PERFORMANCE DEFICITS: in functional skills including ADLs, IADLs, coordination, dexterity, sensation, edema, ROM, strength, pain, fascial restrictions, and UE functional use  IMPAIRMENTS: are limiting patient from ADLs, IADLs, rest and sleep, and leisure.   COMORBIDITIES: has no other co-morbidities that affects occupational performance. Patient will benefit from skilled OT to address above impairments and improve overall function.  MODIFICATION OR ASSISTANCE TO COMPLETE EVALUATION: No modification of tasks or assist necessary to complete an evaluation.  OT OCCUPATIONAL PROFILE AND HISTORY: Problem focused assessment: Including review of records relating to presenting problem.  CLINICAL DECISION MAKING: LOW - limited treatment options, no task modification necessary  REHAB POTENTIAL: Good  EVALUATION COMPLEXITY: Low      PLAN:  OT FREQUENCY: 2x/week  OT DURATION: 8 weeks  PLANNED INTERVENTIONS: 97168 OT Re-evaluation, 97535 self care/ADL training, 02889 therapeutic exercise, 97530 therapeutic activity, 97112 neuromuscular re-education, 97140 manual therapy, 97014 electrical stimulation unattended, patient/family  education, and DME and/or AE instructions  RECOMMENDED OTHER SERVICES: None at this time  CONSULTED AND AGREED WITH PLAN OF CARE: Patient  PLAN  FOR NEXT SESSION: follow up on HEP, A/ROM, digit mobility   Sonny Cory, OTR/L  [phone] 06/21/2024, 12:44 PM

## 2024-06-25 ENCOUNTER — Encounter (HOSPITAL_COMMUNITY): Payer: Self-pay | Admitting: Occupational Therapy

## 2024-06-25 ENCOUNTER — Telehealth: Payer: Self-pay

## 2024-06-25 ENCOUNTER — Ambulatory Visit (HOSPITAL_COMMUNITY): Admitting: Occupational Therapy

## 2024-06-25 DIAGNOSIS — M25531 Pain in right wrist: Secondary | ICD-10-CM

## 2024-06-25 DIAGNOSIS — R29898 Other symptoms and signs involving the musculoskeletal system: Secondary | ICD-10-CM

## 2024-06-25 DIAGNOSIS — M25631 Stiffness of right wrist, not elsewhere classified: Secondary | ICD-10-CM

## 2024-06-25 NOTE — Therapy (Signed)
 OUTPATIENT OCCUPATIONAL THERAPY ORTHO TREATMENT  Patient Name: [name] MRN: [id number] DOB:Feb 27, 1956, 68 y.o., female Today's Date: 06/25/2024    END OF SESSION:  OT End of Session - 06/25/24 1349     Visit Number 2    Number of Visits 16    Date for Recertification  08/20/24   progress note 07/21/24   Authorization Type 1) UHC Medicare Dual Complete 2) [place] Medicaid    Authorization Time Period No auth required    Progress Note Due on Visit 10    OT Start Time 1306    OT Stop Time 1344    OT Time Calculation (min) 38 min    Activity Tolerance Patient tolerated treatment well    Behavior During Therapy WFL for tasks assessed/performed           Past Medical History:  Diagnosis Date   Chronic constipation    Cirrhosis (HCC)    child pugh class A and MELD 5, completed hep A and B vaccine series, no HCC on 3/16 MRI   Colonic inertia    Depression    Diverticula of colon    Diverticulosis 01/31/2008   colonoscopy Dr Shaaron   GERD (gastroesophageal reflux disease)    Hyperplastic colon polyp    Liver mass    Macular degeneration    Obesity    PUD (peptic ulcer disease) 1988   Schizoaffective disorder    Mental Health-Dr Dekle   Seizures (HCC)    as child; no medsd and has had no seizures since then.   Wrist fracture    Left wrist s/p fall and surgical repair   Past Surgical History:  Procedure Laterality Date   ABDOMINAL HYSTERECTOMY     CHOLECYSTECTOMY  08/2010   cholelithiasis; Dr Mavis   COLONOSCOPY  01/31/2008   Dr. Shaaron- normal rectum, L sided diverticula, long tortuous colon. hyperplasitic  polyp.   COLONOSCOPY WITH PROPOFOL  N/A 10/19/2017    single 4 mm polyp in the sigmoid colon which was sessile, scattered small mouth diverticula in the sigmoid and descending colon, otherwise normal.  Surgical pathology found the polyp to be hyperplastic.  Recommended 10-year repeat colonoscopy.   ERCP W/ SPHINCTEROTOMY AND BALLOON DILATION  08/05/2010    normal ampulla/mild erythema in the gastric remnant without ulcerations or erosions. normal retroflexed view of the cardia/anastomosis normal/distal common bile duct stones s/p extraction and shincterotomy   ESOPHAGOGASTRODUODENOSCOPY N/A 10/24/2013   MFM:Wnmfjo esophagus. Surgically altered stomach. Abnormal gastric mucosa  -  status post biopsy (reactive gastrophathy)   ESOPHAGOGASTRODUODENOSCOPY (EGD) WITH PROPOFOL  N/A 11/30/2015   LA grade A esophagitis, no varices, surveillance in 2019   ESOPHAGOGASTRODUODENOSCOPY (EGD) WITH PROPOFOL  N/A 10/19/2017   erosive reflux esophagitis status post dilation, surgically altered stomach status post gastric bypass with a single patent apparent small bowel limb, small hiatal hernia.   ESOPHAGOGASTRODUODENOSCOPY (EGD) WITH PROPOFOL  N/A 07/16/2021   normal esophagus, no varices, portal gastropathy. 3 year screening   EUS N/A 08/20/2015   Dr. Teressa: well-circumscribed 5.2 cm chronic mass in porta hepatis, internally mass is gelatinous. Small distal esophageal varices (limited view), Bilroth 1 type anatomy, cytology NEGATIVE for malignant cells. Suspicion for neoplasm low    FOOT SURGERY     left   GASTRIC BYPASS     with Revision, Dr. Darien STAI DILATION N/A 10/19/2017   Procedure: STAI DILATION;  Surgeon: Shaaron Lamar HERO, MD;  Location: [facility];  Service: Endoscopy;  Laterality: N/A;   OPEN REDUCTION  INTERNAL FIXATION (ORIF) DISTAL RADIAL FRACTURE Right 05/15/2024   Procedure: OPEN REDUCTION INTERNAL FIXATION (ORIF) DISTAL RADIUS FRACTURE;  Surgeon: Margrette Taft BRAVO, MD;  Location: [facility];  Service: Orthopedics;  Laterality: Right;   POLYPECTOMY  10/19/2017   Procedure: POLYPECTOMY;  Surgeon: Shaaron Lamar HERO, MD;  Location: [facility];  Service: Endoscopy;;  sigmoid colon (CS)    REDUCTION MAMMAPLASTY Bilateral    S/P Hysterectomy     partial   TONSILLECTOMY     WRIST SURGERY Left 05/2016   Patient Active Problem List    Diagnosis Date Noted   Fracture, Colles, right, closed 05/22/2024   Bradycardia 04/18/2024   Bilateral sensorineural hearing loss 04/10/2024   Acute hypoxemic respiratory failure (HCC) 10/16/2023   History of thromboembolism 03/02/2021   Tobacco dependence due to cigarettes 03/02/2021   Lung nodule 12/29/2020   Diarrhea 09/17/2020   Acute deep vein thrombosis (DVT) of right lower extremity (HCC) 09/08/2020   Venous embolism 09/08/2020   Cough 03/24/2020   Dyspnea 03/24/2020   Insomnia 03/24/2020   Encounter for screening colonoscopy 09/20/2017   GERD (gastroesophageal reflux disease) 09/15/2016   Mucosal abnormality of stomach    Nausea with vomiting 11/04/2015   Abdominal pain, epigastric 09/17/2015   Peritonitis (HCC) 08/27/2015   Pain following surgery or procedure 08/22/2015   Abdominal pain 08/01/2015   Nausea 08/01/2015   Abdominal mass 08/01/2015   Schizoaffective disorder (HCC) 05/15/2014   Hepatic cirrhosis (HCC) 10/18/2013   LLQ pain 08/13/2013   DEPRESSION 04/08/2008   PUD 04/08/2008   DIVERTICULOSIS, COLON 04/08/2008   Constipation 04/08/2008   MORBID OBESITY, HX OF 04/08/2008   PCP: Drenda Bibles, FNP  REFERRING PROVIDER: Dr. Taft Margrette  ONSET DATE: 05/15/24  REFERRING DIAG:  D47.468I (ICD-10-CM) - Closed Colles' fracture of right radius with routine healing, subsequent encounter  G89.18 (ICD-10-CM) - Acute post-operative pain    THERAPY DIAG:  Pain in right wrist  Stiffness of right wrist, not elsewhere classified  Other symptoms and signs involving the musculoskeletal system  Rationale for Evaluation and Treatment: Rehabilitation  SUBJECTIVE:   SUBJECTIVE STATEMENT: S: I misplaced my papers  PERTINENT HISTORY: Pt is a 68 y/o female s/p ORIF for right colles fx on 05/15/24. Pt sustained fracture when trying to give her dog a bath and slipped when he jumped out of the tub. Pt presents in pre-fabricated wrist brace and sling. Rarely  removes the brace.   PRECAUTIONS: Other: See [place]  Handbook Protocol   WEIGHT BEARING RESTRICTIONS: Yes NWB  PAIN:  Are you having pain? Yes: NPRS scale: 8/10 Pain location: right wrist and hand, thumb Pain description: just hurts Aggravating factors: using it Relieving factors: pain medication  FALLS: Has patient fallen in last 6 months? Yes. Number of falls 1  PLOF: Independent  PATIENT GOALS: To have less pain and more mobility.   NEXT MD VISIT: 07/10/24  OBJECTIVE:  Note: Objective measures were completed at Evaluation unless otherwise noted.  HAND DOMINANCE: Right  ADLs: Pt is not using the right hand/wrist for any ADLs. Requires assistance for housework, cooking, cleaning. Pt reports she is completing dressing, bathing, grooming tasks herself, but has pain and difficulty.   FUNCTIONAL OUTCOME MEASURES: Quick Dash:  QUICK DASH  Please rate your ability do the following activities in the last week by selecting the number below the appropriate response.   Activities Rating  Open a tight or new jar.  5 = Unable  Do heavy household chores (e.g., wash walls, floors).  5 = Unable  Carry a shopping bag or briefcase 3 = Moderate difficulty  Wash your back. 5 = Unable  Use a knife to cut food. 5 = Unable  Recreational activities in which you take some force or impact through your arm, shoulder or hand (e.g., golf, hammering, tennis, etc.). 5 = Unable  During the past week, to what extent has your arm, shoulder or hand problem interfered with your normal social activities with family, friends, neighbors or groups?  4 = Quite a bit  During the past week, were you limited in your work or other regular daily activities as a result of your arm, shoulder or hand problem? 4 = Very limited  Rate the severity of the following symptoms in the last week: Arm, Shoulder, or hand pain. 3 = Moderate  Rate the severity of the following symptoms in the last week: Tingling (pins and needles)  in your arm, shoulder or hand. 5 = Extreme  During the past week, how much difficulty have you had sleeping because of the pain in your arm, shoulder or hand?  4 = Severe difficulty   (A QuickDASH score may not be calculated if there is greater than 1 missing item.)  Quick Dash Disability/Symptom Score: [(sum of 48 (n) responses/11 (n)] -1x 25 = 84.09  Minimally Clinically Important Difference (MCID): 15-20 points  (Franchignoni, F. et al. (2013). Minimally clinically important difference of the disabilities of the arm, shoulder, and hand outcome measures (DASH) and its shortened version (Quick DASH). Journal of Orthopaedic & Sports Physical Therapy, 44(1), 30-39)    UPPER EXTREMITY ROM:       Assessed seated at tabletop in gravity eliminated position  Active ROM Right eval  Wrist flexion 22  Wrist extension 18  Wrist ulnar deviation 10  Wrist radial deviation 11  Wrist pronation 90  Wrist supination 90  (Blank rows = not tested)  Active ROM Right eval  Thumb MCP (0-60) 28  Thumb IP (0-80) 30  Thumb Radial abd/add (0-55)   Thumb Palmar abd/add (0-45)   Thumb Opposition to Small Finger 1cm  Index MCP (0-90) 34  Index PIP (0-100) 60  Index DIP (0-70)  40  Long MCP (0-90)  42  Long PIP (0-100)  88  Long DIP (0-70)  46  Ring MCP (0-90)  36  Ring PIP (0-100)  58  Ring DIP (0-70)  38  Little MCP (0-90)  38  Little PIP (0-100)  60  Little DIP (0-70)  42  (Blank rows = not tested)   UPPER EXTREMITY MMT:       Unable to test on evaluation due to pain/protocol  MMT Right eval  Wrist flexion   Wrist extension   Wrist ulnar deviation   Wrist radial deviation   Wrist pronation   Wrist supination   (Blank rows = not tested)  HAND FUNCTION: Grip strength: Right: 0 lbs; Left: 46 lbs, Lateral pinch: Right: 1 lbs, Left: 9 lbs, and 3 point pinch: Right: 0 lbs, Left: 9 lbs  SENSATION: Reports numbness in the thumb but feels like the thumb is going to explode  EDEMA:  Minimal to none  OBSERVATIONS: Pt reports she took 2 pain pills prior to session. Pt with slow responses, glassy eyes, laying on table.    TREATMENT DATE:  06/25/24 -Retrograde massage and myofascial release to right wrist and volar forearm to decrease edema and fascial restrictions and increase joint ROM -A/ROM: seated with RUE propped on half wedge-wrist extension, flexion, ulnar/radial deviation, 10 reps; 2 rounds -Finger A/ROM: finger taps, towel crunch, composite digit flexion/extension-2 rounds, thumb/digit opposition, digit abduction/adduction, 10 reps -Sponges: 5, 5, 6 -Tendon glides: 10 reps -Chip manipulation: pt holding sequence chips in palm, translating to fingertips and placing on towel. Pt then picking up chips one at a time and putting palm, holding up to 5 chips at a time -Squeezing orange/white sponge: 10 reps, min grip strength noted -Composite MCP flexion, composite PIP flexion: 10 reps -Thumb abduction, 10 reps    PATIENT EDUCATION: Education details: Finger and wrist A/ROM-provided additional copy Person educated: Patient Education method: Explanation, Demonstration, and Handouts Education comprehension: verbalized understanding and returned demonstration  HOME EXERCISE PROGRAM: Eval:  Finger and wrist A/ROM   GOALS: Goals reviewed with patient? Yes  SHORT TERM GOALS: Target date: 07/21/24  Pt will be provided with and educated on HEP to improve mobility of right wrist and digits required for use during ADL tasks.   Goal status: IN PROGRESS  2.  Pt will increase A/ROM of right wrist by 20+ degrees to improve mobility required for participation in dressing, bathing, and grooming tasks.   Goal status: IN PROGRESS  3.  Pt will register right grip strength of at least 10# and pinch strength of at least 3# to improve ability to  participate in simple meal preparation using the RUE.   Goal status: IN PROGRESS  4.  Pt will increase digit ROM required for a full grasp, to improve ability to grasp and hold objects such as cups, coins, etc.   Goal status: IN PROGRESS    LONG TERM GOALS: Target date: 08/20/24  Pt will decrease pain in right wrist and hand to 4/10 or less to improve ability to sleep for 2+ consecutive hours without waking due to pain.   Goal status: IN PROGRESS  2.  Pt will increase right wrist A/ROM by 30+ degrees to improve ability to perform housework tasks using the RUE as dominant.   Goal status: IN PROGRESS  3.  Pt will increase right wrist strength to 4/5 or greater to improve ability to use RUE for meal preparation with lifting pots and pans.   Goal status: IN PROGRESS  4.  Pt will increase right grip strength to at least 30# and grip strength to at least 8# to improve ability to grasp and hold objects during housework and yardwork.   Goal status: IN PROGRESS    ASSESSMENT:  CLINICAL IMPRESSION: Pt reports continued high pain today, reports she misplaced her HEP, provided additional copy today. Initiated manual techniques for edema management, pt reports wrist felt good during retrograde massage. Pt completing A/ROM, cuing to extend wrist and not just fingers, once cued pt able to perform wrist extension very well, reports flexion is more difficult. Pt requiring frequent verbal and visual cuing for correct form and technique during tasks.     PERFORMANCE DEFICITS: in functional skills including ADLs, IADLs, coordination, dexterity, sensation, edema, ROM, strength, pain, fascial restrictions, and UE functional use      PLAN:  OT FREQUENCY: 2x/week  OT DURATION: 8 weeks  PLANNED INTERVENTIONS: 97168 OT Re-evaluation, 97535 self care/ADL training, 02889 therapeutic exercise, 97530 therapeutic activity, 97112 neuromuscular re-education, 97140 manual therapy, 97014 electrical  stimulation unattended, patient/family education, and DME and/or AE instructions  CONSULTED  AND AGREED WITH PLAN OF CARE: Patient  PLAN FOR NEXT SESSION: follow up on HEP, A/ROM, digit mobility   Sonny Cory, OTR/L  [phone] 06/25/2024, 1:50 PM

## 2024-06-25 NOTE — Telephone Encounter (Signed)
 I called patient and relayed your message about no stronger medicine this far out from surgery.  She is asking if you could send her something if for pain she is out of medicine.  Hydrocodone -Acetaminophen  10/325 MG  Qty 30 Tablets  Take 1 tablet by mouth every 6 (six) hours as needed.   PATIENT USES [facility] ON [address]

## 2024-06-25 NOTE — Telephone Encounter (Signed)
 Patient wants something stronger than Hydrocodone  and states she doesn't need any Tylenol .   PATIENT USES [facility] ON [address]

## 2024-06-25 NOTE — Patient Instructions (Signed)
 AROM Exercises: *Complete exercises 10 times each, 2 times per day*   1) Wrist Flexion  Start with wrist at edge of table, palm facing up. With wrist hanging slightly off table, curl wrist upward, and back down.      2) Wrist Extension  Start with wrist at edge of table, palm facing down. With wrist slightly off the edge of the table, curl wrist up and back down.      3) Radial Deviations  Start with forearm flat against a table, wrist hanging slightly off the edge, and palm facing the wall. Bending at the wrist only, and keeping palm facing the wall, bend wrist so fist is pointing towards the floor, back up to start position, and up towards the ceiling. Return to start.       Finger A/ROM-Complete each exercise 10-15X, 2-3X/day  1) Towel crunch Place a small towel on a firm table top. Flatten out the towel and then place your hand on one end of it.  Next, flex your fingers 2-5 (index finger through pinky finger) as you pull the towel towards your hand.    2) Digit composite flexion/adduction (make a fist) Hold your hand up as shown. Open and close your hand into a fist and repeat. If you cannot make a full fist, then make a partial fist.    3) Thumb/finger opposition Touch the tip of the thumb to each fingertip one by one. Extend fingers fully after they are touched.      4) Finger Taps Start with the hand flat and fingers slightly spread.  One at a time, starting with the thumb, lift each finger up separately.     5) Digit Abduction/Adduction Hold hand palm down flat on table. Spread your fingers apart and back together.

## 2024-06-26 ENCOUNTER — Other Ambulatory Visit: Payer: Self-pay | Admitting: Orthopedic Surgery

## 2024-06-26 DIAGNOSIS — G8918 Other acute postprocedural pain: Secondary | ICD-10-CM

## 2024-06-26 DIAGNOSIS — S52531D Colles' fracture of right radius, subsequent encounter for closed fracture with routine healing: Secondary | ICD-10-CM

## 2024-06-26 MED ORDER — HYDROCODONE-ACETAMINOPHEN 5-325 MG PO TABS: 1.0000 | ORAL_TABLET | ORAL | 0 refills | Status: AC | PRN

## 2024-06-27 ENCOUNTER — Encounter (HOSPITAL_COMMUNITY): Payer: Self-pay | Admitting: Occupational Therapy

## 2024-06-27 ENCOUNTER — Ambulatory Visit (HOSPITAL_COMMUNITY): Admitting: Occupational Therapy

## 2024-06-27 DIAGNOSIS — M25531 Pain in right wrist: Secondary | ICD-10-CM

## 2024-06-27 DIAGNOSIS — M25631 Stiffness of right wrist, not elsewhere classified: Secondary | ICD-10-CM

## 2024-06-27 DIAGNOSIS — R29898 Other symptoms and signs involving the musculoskeletal system: Secondary | ICD-10-CM

## 2024-06-27 NOTE — Therapy (Signed)
 OUTPATIENT OCCUPATIONAL THERAPY ORTHO TREATMENT  Patient Name: [name] MRN: [id number] DOB:1956-07-14, 68 y.o., female Today's Date: 06/27/2024    END OF SESSION:  OT End of Session - 06/27/24 1446     Visit Number 3    Number of Visits 16    Date for Recertification  08/20/24   progress note 07/21/24   Authorization Type 1) [employer] Medicare Dual Complete 2) [place] Medicaid    Authorization Time Period No auth required    Progress Note Due on Visit 10    OT Start Time 1352    OT Stop Time 1431    OT Time Calculation (min) 39 min    Activity Tolerance Patient tolerated treatment well    Behavior During Therapy WFL for tasks assessed/performed          Past Medical History:  Diagnosis Date   Chronic constipation    Cirrhosis (HCC)    child pugh class A and MELD 5, completed hep A and B vaccine series, no HCC on 3/16 MRI   Colonic inertia    Depression    Diverticula of colon    Diverticulosis 01/31/2008   colonoscopy Dr Shaaron   GERD (gastroesophageal reflux disease)    Hyperplastic colon polyp    Liver mass    Macular degeneration    Obesity    PUD (peptic ulcer disease) 1988   Schizoaffective disorder    Mental Health-Dr Dekle   Seizures (HCC)    as child; no medsd and has had no seizures since then.   Wrist fracture    Left wrist s/p fall and surgical repair   Past Surgical History:  Procedure Laterality Date   ABDOMINAL HYSTERECTOMY     CHOLECYSTECTOMY  08/2010   cholelithiasis; Dr Mavis   COLONOSCOPY  01/31/2008   Dr. Shaaron- normal rectum, L sided diverticula, long tortuous colon. hyperplasitic  polyp.   COLONOSCOPY WITH PROPOFOL  N/A 10/19/2017    single 4 mm polyp in the sigmoid colon which was sessile, scattered small mouth diverticula in the sigmoid and descending colon, otherwise normal.  Surgical pathology found the polyp to be hyperplastic.  Recommended 10-year repeat colonoscopy.   ERCP W/ SPHINCTEROTOMY AND BALLOON DILATION  08/05/2010   normal  ampulla/mild erythema in the gastric remnant without ulcerations or erosions. normal retroflexed view of the cardia/anastomosis normal/distal common bile duct stones s/p extraction and shincterotomy   ESOPHAGOGASTRODUODENOSCOPY N/A 10/24/2013   MFM:Wnmfjo esophagus. Surgically altered stomach. Abnormal gastric mucosa  -  status post biopsy (reactive gastrophathy)   ESOPHAGOGASTRODUODENOSCOPY (EGD) WITH PROPOFOL  N/A 11/30/2015   LA grade A esophagitis, no varices, surveillance in 2019   ESOPHAGOGASTRODUODENOSCOPY (EGD) WITH PROPOFOL  N/A 10/19/2017   erosive reflux esophagitis status post dilation, surgically altered stomach status post gastric bypass with a single patent apparent small bowel limb, small hiatal hernia.   ESOPHAGOGASTRODUODENOSCOPY (EGD) WITH PROPOFOL  N/A 07/16/2021   normal esophagus, no varices, portal gastropathy. 3 year screening   EUS N/A 08/20/2015   Dr. Teressa: well-circumscribed 5.2 cm chronic mass in porta hepatis, internally mass is gelatinous. Small distal esophageal varices (limited view), Bilroth 1 type anatomy, cytology NEGATIVE for malignant cells. Suspicion for neoplasm low    FOOT SURGERY     left   GASTRIC BYPASS     with Revision, Dr. Darien STAI DILATION N/A 10/19/2017   Procedure: STAI DILATION;  Surgeon: Shaaron Lamar HERO, MD;  Location: [facility];  Service: Endoscopy;  Laterality: N/A;   OPEN REDUCTION INTERNAL  FIXATION (ORIF) DISTAL RADIAL FRACTURE Right 05/15/2024   Procedure: OPEN REDUCTION INTERNAL FIXATION (ORIF) DISTAL RADIUS FRACTURE;  Surgeon: Margrette Taft BRAVO, MD;  Location: [facility];  Service: Orthopedics;  Laterality: Right;   POLYPECTOMY  10/19/2017   Procedure: POLYPECTOMY;  Surgeon: Shaaron Lamar HERO, MD;  Location: [facility];  Service: Endoscopy;;  sigmoid colon (CS)    REDUCTION MAMMAPLASTY Bilateral    S/P Hysterectomy     partial   TONSILLECTOMY     WRIST SURGERY Left 05/2016   Patient Active Problem List   Diagnosis  Date Noted   Fracture, Colles, right, closed 05/22/2024   Bradycardia 04/18/2024   Bilateral sensorineural hearing loss 04/10/2024   Acute hypoxemic respiratory failure (HCC) 10/16/2023   History of thromboembolism 03/02/2021   Tobacco dependence due to cigarettes 03/02/2021   Lung nodule 12/29/2020   Diarrhea 09/17/2020   Acute deep vein thrombosis (DVT) of right lower extremity (HCC) 09/08/2020   Venous embolism 09/08/2020   Cough 03/24/2020   Dyspnea 03/24/2020   Insomnia 03/24/2020   Encounter for screening colonoscopy 09/20/2017   GERD (gastroesophageal reflux disease) 09/15/2016   Mucosal abnormality of stomach    Nausea with vomiting 11/04/2015   Abdominal pain, epigastric 09/17/2015   Peritonitis (HCC) 08/27/2015   Pain following surgery or procedure 08/22/2015   Abdominal pain 08/01/2015   Nausea 08/01/2015   Abdominal mass 08/01/2015   Schizoaffective disorder (HCC) 05/15/2014   Hepatic cirrhosis (HCC) 10/18/2013   LLQ pain 08/13/2013   DEPRESSION 04/08/2008   PUD 04/08/2008   DIVERTICULOSIS, COLON 04/08/2008   Constipation 04/08/2008   MORBID OBESITY, HX OF 04/08/2008   PCP: Drenda Bibles, FNP  REFERRING PROVIDER: Dr. Taft Margrette  ONSET DATE: 05/15/24  REFERRING DIAG:  D47.468I (ICD-10-CM) - Closed Colles' fracture of right radius with routine healing, subsequent encounter  G89.18 (ICD-10-CM) - Acute post-operative pain    THERAPY DIAG:  Pain in right wrist  Stiffness of right wrist, not elsewhere classified  Other symptoms and signs involving the musculoskeletal system  Rationale for Evaluation and Treatment: Rehabilitation  SUBJECTIVE:   SUBJECTIVE STATEMENT: S: I gotta go back to the doctor on the 5th  PERTINENT HISTORY: Pt is a 68 y/o female s/p ORIF for right colles fx on 05/15/24. Pt sustained fracture when trying to give her dog a bath and slipped when he jumped out of the tub. Pt presents in pre-fabricated wrist brace and sling.  Rarely removes the brace.   PRECAUTIONS: Other: See [place]  Handbook Protocol   WEIGHT BEARING RESTRICTIONS: Yes NWB  PAIN:  Are you having pain? Yes: NPRS scale: 8/10 Pain location: right wrist and hand, thumb Pain description: just hurts Aggravating factors: using it Relieving factors: pain medication  FALLS: Has patient fallen in last 6 months? Yes. Number of falls 1  PLOF: Independent  PATIENT GOALS: To have less pain and more mobility.   NEXT MD VISIT: 07/10/24  OBJECTIVE:  Note: Objective measures were completed at Evaluation unless otherwise noted.  HAND DOMINANCE: Right  ADLs: Pt is not using the right hand/wrist for any ADLs. Requires assistance for housework, cooking, cleaning. Pt reports she is completing dressing, bathing, grooming tasks herself, but has pain and difficulty.   FUNCTIONAL OUTCOME MEASURES: Quick Dash:  QUICK DASH  Please rate your ability do the following activities in the last week by selecting the number below the appropriate response.   Activities Rating  Open a tight or new jar.  5 = Unable  Do heavy household  chores (e.g., wash walls, floors). 5 = Unable  Carry a shopping bag or briefcase 3 = Moderate difficulty  Wash your back. 5 = Unable  Use a knife to cut food. 5 = Unable  Recreational activities in which you take some force or impact through your arm, shoulder or hand (e.g., golf, hammering, tennis, etc.). 5 = Unable  During the past week, to what extent has your arm, shoulder or hand problem interfered with your normal social activities with family, friends, neighbors or groups?  4 = Quite a bit  During the past week, were you limited in your work or other regular daily activities as a result of your arm, shoulder or hand problem? 4 = Very limited  Rate the severity of the following symptoms in the last week: Arm, Shoulder, or hand pain. 3 = Moderate  Rate the severity of the following symptoms in the last week: Tingling (pins and  needles) in your arm, shoulder or hand. 5 = Extreme  During the past week, how much difficulty have you had sleeping because of the pain in your arm, shoulder or hand?  4 = Severe difficulty   (A QuickDASH score may not be calculated if there is greater than 1 missing item.)  Quick Dash Disability/Symptom Score: [(sum of 48 (n) responses/11 (n)] -1x 25 = 84.09  Minimally Clinically Important Difference (MCID): 15-20 points  (Franchignoni, F. et al. (2013). Minimally clinically important difference of the disabilities of the arm, shoulder, and hand outcome measures (DASH) and its shortened version (Quick DASH). Journal of Orthopaedic & Sports Physical Therapy, 44(1), 30-39)    UPPER EXTREMITY ROM:       Assessed seated at tabletop in gravity eliminated position  Active ROM Right eval  Wrist flexion 22  Wrist extension 18  Wrist ulnar deviation 10  Wrist radial deviation 11  Wrist pronation 90  Wrist supination 90  (Blank rows = not tested)  Active ROM Right eval  Thumb MCP (0-60) 28  Thumb IP (0-80) 30  Thumb Radial abd/add (0-55)   Thumb Palmar abd/add (0-45)   Thumb Opposition to Small Finger 1cm  Index MCP (0-90) 34  Index PIP (0-100) 60  Index DIP (0-70)  40  Long MCP (0-90)  42  Long PIP (0-100)  88  Long DIP (0-70)  46  Ring MCP (0-90)  36  Ring PIP (0-100)  58  Ring DIP (0-70)  38  Little MCP (0-90)  38  Little PIP (0-100)  60  Little DIP (0-70)  42  (Blank rows = not tested)   UPPER EXTREMITY MMT:       Unable to test on evaluation due to pain/protocol  MMT Right eval  Wrist flexion   Wrist extension   Wrist ulnar deviation   Wrist radial deviation   Wrist pronation   Wrist supination   (Blank rows = not tested)  HAND FUNCTION: Grip strength: Right: 0 lbs; Left: 46 lbs, Lateral pinch: Right: 1 lbs, Left: 9 lbs, and 3 point pinch: Right: 0 lbs, Left: 9 lbs  SENSATION: Reports numbness in the thumb but feels like the thumb is going to  explode  EDEMA: Minimal to none  OBSERVATIONS: Pt reports she took 2 pain pills prior to session. Pt with slow responses, glassy eyes, laying on table.    TREATMENT DATE:  06/27/24 -Retrograde massage and myofascial release to right wrist and volar forearm to decrease edema and fascial restrictions and increase joint ROM -A/ROM: seated with RUE propped on half wedge-wrist extension, flexion, ulnar/radial deviation, 2x10 -Digit ROM: composite flexion, abduction, finger taps, opposition, x10 -Grooved peg board - unable to complete -9 hole peg test - RUE= 1'17 -Tendon glides: 10 reps -Towel crumple: x10  06/25/24 -Retrograde massage and myofascial release to right wrist and volar forearm to decrease edema and fascial restrictions and increase joint ROM -A/ROM: seated with RUE propped on half wedge-wrist extension, flexion, ulnar/radial deviation, 10 reps; 2 rounds -Finger A/ROM: finger taps, towel crunch, composite digit flexion/extension-2 rounds, thumb/digit opposition, digit abduction/adduction, 10 reps -Sponges: 5, 5, 6 -Tendon glides: 10 reps -Chip manipulation: pt holding sequence chips in palm, translating to fingertips and placing on towel. Pt then picking up chips one at a time and putting palm, holding up to 5 chips at a time -Squeezing orange/white sponge: 10 reps, min grip strength noted -Composite MCP flexion, composite PIP flexion: 10 reps -Thumb abduction, 10 reps    PATIENT EDUCATION: Education details: Towel Crumple Person educated: Patient Education method: Explanation, Demonstration, and Handouts Education comprehension: verbalized understanding and returned demonstration  HOME EXERCISE PROGRAM: Eval:  Finger and wrist A/ROM 10/23: Towel crumple   GOALS: Goals reviewed with patient? Yes  SHORT TERM GOALS: Target date: 07/21/24  Pt  will be provided with and educated on HEP to improve mobility of right wrist and digits required for use during ADL tasks.   Goal status: IN PROGRESS  2.  Pt will increase A/ROM of right wrist by 20+ degrees to improve mobility required for participation in dressing, bathing, and grooming tasks.   Goal status: IN PROGRESS  3.  Pt will register right grip strength of at least 10# and pinch strength of at least 3# to improve ability to participate in simple meal preparation using the RUE.   Goal status: IN PROGRESS  4.  Pt will increase digit ROM required for a full grasp, to improve ability to grasp and hold objects such as cups, coins, etc.   Goal status: IN PROGRESS    LONG TERM GOALS: Target date: 08/20/24  Pt will decrease pain in right wrist and hand to 4/10 or less to improve ability to sleep for 2+ consecutive hours without waking due to pain.   Goal status: IN PROGRESS  2.  Pt will increase right wrist A/ROM by 30+ degrees to improve ability to perform housework tasks using the RUE as dominant.   Goal status: IN PROGRESS  3.  Pt will increase right wrist strength to 4/5 or greater to improve ability to use RUE for meal preparation with lifting pots and pans.   Goal status: IN PROGRESS  4.  Pt will increase right grip strength to at least 30# and grip strength to at least 8# to improve ability to grasp and hold objects during housework and yardwork.   Goal status: IN PROGRESS    ASSESSMENT:  CLINICAL IMPRESSION: This session pt continues to report high pain levels, however it is not limiting her mobility. She is demonstrating improving wrist ROM, as well as improving digit ROM for functional mobility. Pt was unable to complete the grooved peg board as she reports she is unable to spin/manipulate the pegs in her hand to place them in the board. OT providing verbal and tactile cuing for positioning and technique throughout session.   PERFORMANCE DEFICITS: in functional  skills including  ADLs, IADLs, coordination, dexterity, sensation, edema, ROM, strength, pain, fascial restrictions, and UE functional use      PLAN:  OT FREQUENCY: 2x/week  OT DURATION: 8 weeks  PLANNED INTERVENTIONS: 97168 OT Re-evaluation, 97535 self care/ADL training, 02889 therapeutic exercise, 97530 therapeutic activity, 97112 neuromuscular re-education, 97140 manual therapy, 97014 electrical stimulation unattended, patient/family education, and DME and/or AE instructions  CONSULTED AND AGREED WITH PLAN OF CARE: Patient  PLAN FOR NEXT SESSION: follow up on HEP, A/ROM, digit mobility   Valentin Nightingale, OTR/L [phone] 06/27/2024, 2:47 PM

## 2024-07-01 ENCOUNTER — Telehealth (HOSPITAL_COMMUNITY): Payer: Self-pay | Admitting: Occupational Therapy

## 2024-07-01 ENCOUNTER — Ambulatory Visit (HOSPITAL_COMMUNITY): Admitting: Occupational Therapy

## 2024-07-01 NOTE — Telephone Encounter (Signed)
 This OT called pt regarding No Show on 07/01/24. Pt did not answer and there is no Voice Mail Box. Pt's next appointment is 07/05/24, office will follow up as able.   Valentin Nightingale, OTR/L [facility] [phone]

## 2024-07-05 ENCOUNTER — Telehealth (HOSPITAL_COMMUNITY): Payer: Self-pay | Admitting: Occupational Therapy

## 2024-07-05 ENCOUNTER — Ambulatory Visit (HOSPITAL_COMMUNITY): Admitting: Occupational Therapy

## 2024-07-05 NOTE — Telephone Encounter (Signed)
 Called pt regarding no show for 10/31 appt. Pt answered, reports she has been sick as a dog all week and did not have a number to call the clinic to cancel her appts. Provided the clinic's phone number and requested pt call if she is unable to make her next appt on 07/08/24. Pt repeated number back and verbalized understanding.    Sonny Cory, OTR/L  [phone] 07/05/24

## 2024-07-08 ENCOUNTER — Ambulatory Visit (HOSPITAL_COMMUNITY): Admitting: Occupational Therapy

## 2024-07-09 ENCOUNTER — Encounter: Payer: Self-pay | Admitting: Emergency Medicine

## 2024-07-09 ENCOUNTER — Other Ambulatory Visit: Payer: Self-pay

## 2024-07-09 ENCOUNTER — Ambulatory Visit
Admission: EM | Admit: 2024-07-09 | Discharge: 2024-07-09 | Disposition: A | Discharge status: Home / Self Care | Attending: Nurse Practitioner | Admitting: Nurse Practitioner

## 2024-07-09 DIAGNOSIS — R112 Nausea with vomiting, unspecified: Secondary | ICD-10-CM | POA: Diagnosis not present

## 2024-07-09 DIAGNOSIS — R531 Weakness: Secondary | ICD-10-CM

## 2024-07-09 LAB — POCT URINE DIPSTICK
Bilirubin, UA: NEGATIVE
Blood, UA: NEGATIVE
Glucose, UA: NEGATIVE mg/dL
Ketones, POC UA: NEGATIVE mg/dL
Leukocytes, UA: NEGATIVE
Nitrite, UA: NEGATIVE
POC PROTEIN,UA: NEGATIVE
Spec Grav, UA: 1.015 (ref 1.010–1.025)
Urobilinogen, UA: 1 U/dL
pH, UA: 6.5 (ref 5.0–8.0)

## 2024-07-09 LAB — POC SOFIA SARS ANTIGEN FIA: SARS Coronavirus 2 Ag: NEGATIVE

## 2024-07-09 MED ORDER — ONDANSETRON 4 MG PO TBDP: 4.0000 mg | ORAL_TABLET | Freq: Three times a day (TID) | ORAL | 0 refills | Status: AC | PRN

## 2024-07-09 NOTE — ED Provider Notes (Signed)
 [facility]    CSN: [id number] Arrival date & time: 07/09/24  1015      History   Chief Complaint Chief Complaint  Patient presents with   nausea and vomiting since Wednesday    HPI [name] is a 68 y.o. female.   The history is provided by the patient.   Patient presents with a several day history of nausea, vomiting, and generalized weakness.  Patient states that she has not vomited over the past 24 to 48 hours.  She states that she continues to experience generalized weakness.  She denies fever, chills, chest pain, abdominal pain, vomiting, diarrhea, or rash.  Patient states that she did have a flu shot 2 days prior to her symptoms starting.  So far, states she has not been taking any medications for her symptoms.  She states her last bowel movement was approximately 3 days ago.  She does endorse prior history of constipation.  Further denies gas, bloating, or bloody stools.  Past Medical History:  Diagnosis Date   Chronic constipation    Cirrhosis (HCC)    child pugh class A and MELD 5, completed hep A and B vaccine series, no HCC on 3/16 MRI   Colonic inertia    Depression    Diverticula of colon    Diverticulosis 01/31/2008   colonoscopy Dr Shaaron   GERD (gastroesophageal reflux disease)    Hyperplastic colon polyp    Liver mass    Macular degeneration    Obesity    PUD (peptic ulcer disease) 1988   Schizoaffective disorder    Mental Health-Dr Dekle   Seizures (HCC)    as child; no medsd and has had no seizures since then.   Wrist fracture    Left wrist s/p fall and surgical repair    Patient Active Problem List   Diagnosis Date Noted   Fracture, Colles, right, closed 05/22/2024   Bradycardia 04/18/2024   Bilateral sensorineural hearing loss 04/10/2024   Acute hypoxemic respiratory failure (HCC) 10/16/2023   History of thromboembolism 03/02/2021   Tobacco dependence due to cigarettes 03/02/2021   Lung nodule 12/29/2020   Diarrhea  09/17/2020   Acute deep vein thrombosis (DVT) of right lower extremity (HCC) 09/08/2020   Venous embolism 09/08/2020   Cough 03/24/2020   Dyspnea 03/24/2020   Insomnia 03/24/2020   Encounter for screening colonoscopy 09/20/2017   GERD (gastroesophageal reflux disease) 09/15/2016   Mucosal abnormality of stomach    Nausea with vomiting 11/04/2015   Abdominal pain, epigastric 09/17/2015   Peritonitis (HCC) 08/27/2015   Pain following surgery or procedure 08/22/2015   Abdominal pain 08/01/2015   Nausea 08/01/2015   Abdominal mass 08/01/2015   Schizoaffective disorder (HCC) 05/15/2014   Hepatic cirrhosis (HCC) 10/18/2013   LLQ pain 08/13/2013   DEPRESSION 04/08/2008   PUD 04/08/2008   DIVERTICULOSIS, COLON 04/08/2008   Constipation 04/08/2008   MORBID OBESITY, HX OF 04/08/2008    Past Surgical History:  Procedure Laterality Date   ABDOMINAL HYSTERECTOMY     CHOLECYSTECTOMY  08/2010   cholelithiasis; Dr Mavis   COLONOSCOPY  01/31/2008   Dr. Shaaron- normal rectum, L sided diverticula, long tortuous colon. hyperplasitic  polyp.   COLONOSCOPY WITH PROPOFOL  N/A 10/19/2017    single 4 mm polyp in the sigmoid colon which was sessile, scattered small mouth diverticula in the sigmoid and descending colon, otherwise normal.  Surgical pathology found the polyp to be hyperplastic.  Recommended 10-year repeat colonoscopy.   ERCP W/ SPHINCTEROTOMY  AND BALLOON DILATION  08/05/2010   normal ampulla/mild erythema in the gastric remnant without ulcerations or erosions. normal retroflexed view of the cardia/anastomosis normal/distal common bile duct stones s/p extraction and shincterotomy   ESOPHAGOGASTRODUODENOSCOPY N/A 10/24/2013   MFM:Wnmfjo esophagus. Surgically altered stomach. Abnormal gastric mucosa  -  status post biopsy (reactive gastrophathy)   ESOPHAGOGASTRODUODENOSCOPY (EGD) WITH PROPOFOL  N/A 11/30/2015   LA grade A esophagitis, no varices, surveillance in 2019    ESOPHAGOGASTRODUODENOSCOPY (EGD) WITH PROPOFOL  N/A 10/19/2017   erosive reflux esophagitis status post dilation, surgically altered stomach status post gastric bypass with a single patent apparent small bowel limb, small hiatal hernia.   ESOPHAGOGASTRODUODENOSCOPY (EGD) WITH PROPOFOL  N/A 07/16/2021   normal esophagus, no varices, portal gastropathy. 3 year screening   EUS N/A 08/20/2015   Dr. Teressa: well-circumscribed 5.2 cm chronic mass in porta hepatis, internally mass is gelatinous. Small distal esophageal varices (limited view), Bilroth 1 type anatomy, cytology NEGATIVE for malignant cells. Suspicion for neoplasm low    FOOT SURGERY     left   GASTRIC BYPASS     with Revision, Dr. Darien STAI DILATION N/A 10/19/2017   Procedure: STAI DILATION;  Surgeon: Shaaron Lamar HERO, MD;  Location: [facility];  Service: Endoscopy;  Laterality: N/A;   OPEN REDUCTION INTERNAL FIXATION (ORIF) DISTAL RADIAL FRACTURE Right 05/15/2024   Procedure: OPEN REDUCTION INTERNAL FIXATION (ORIF) DISTAL RADIUS FRACTURE;  Surgeon: Margrette Taft BRAVO, MD;  Location: [facility];  Service: Orthopedics;  Laterality: Right;   POLYPECTOMY  10/19/2017   Procedure: POLYPECTOMY;  Surgeon: Shaaron Lamar HERO, MD;  Location: [facility];  Service: Endoscopy;;  sigmoid colon (CS)    REDUCTION MAMMAPLASTY Bilateral    S/P Hysterectomy     partial   TONSILLECTOMY     WRIST SURGERY Left 05/2016    OB History   No obstetric history on file.      Home Medications    Prior to Admission medications   Medication Sig Start Date End Date Taking? Authorizing Provider  albuterol  (VENTOLIN  HFA) 108 (90 Base) MCG/ACT inhaler Inhale 2 puffs into the lungs every 4 (four) hours as needed for shortness of breath or wheezing. 10/23/23   Johnson, Clanford L, MD  FLUoxetine  (PROZAC ) 40 MG capsule Take 40 mg by mouth in the morning. 06/06/12   [provider]  ibuprofen  (ADVIL ) 800 MG tablet Take 1 tablet (800 mg total) by  mouth every 8 (eight) hours as needed. 05/23/24   Margrette Taft BRAVO, MD  linaclotide  (LINZESS ) 290 MCG CAPS capsule Take 1 capsule (290 mcg total) by mouth daily before breakfast. 12/14/23   Shirlean Therisa ORN, NP  methocarbamol  (ROBAXIN ) 500 MG tablet Take 500 mg by mouth 4 (four) times daily.    [provider]  ondansetron  (ZOFRAN ) 4 MG tablet Take 1 tablet (4 mg total) by mouth every 8 (eight) hours as needed for nausea or vomiting. 06/15/23   Shirlean Therisa ORN, NP  ondansetron  (ZOFRAN ) 8 MG tablet Take 1 tablet (8 mg total) by mouth every 8 (eight) hours as needed for nausea or vomiting. 12/14/23   Shirlean Therisa ORN, NP  pantoprazole  (PROTONIX ) 40 MG tablet TAKE 1 TABLET BY MOUTH DAILY 02/05/24   Shirlean Therisa ORN, NP  promethazine  (PHENERGAN ) 12.5 MG tablet Take 1 tablet (12.5 mg total) by mouth every 6 (six) hours as needed for nausea or vomiting. 05/15/24   Margrette Taft BRAVO, MD  risperiDONE  (RISPERDAL ) 3 MG tablet Take 3 mg by mouth  at bedtime.     [provider]  SYMBICORT 160-4.5 MCG/ACT inhaler Inhale 2 puffs into the lungs daily. 02/07/24   [provider]  traZODone  (DESYREL ) 150 MG tablet Take 300 mg by mouth at bedtime. 07/20/15   [provider]    Family History Family History  Problem Relation Age of Onset   Diabetes Father    Colon cancer Maternal Uncle    Liver cancer Maternal Uncle        89 years old   Liver disease Neg Hx     Social History Social History   Tobacco Use   Smoking status: Every Day    Current packs/day: 1.00    Average packs/day: 1 pack/day for 36.0 years (36.0 ttl pk-yrs)    Types: Cigarettes   Smokeless tobacco: Never  Vaping Use   Vaping status: Never Used  Substance Use Topics   Alcohol use: No    Alcohol/week: 0.0 standard drinks of alcohol   Drug use: Yes    Types: Marijuana    Comment: 3 joints/day     Allergies   Ciprofloxacin and Kiwi extract   Review of Systems Review of Systems Per HPI  Physical  Exam Triage Vital Signs ED Triage Vitals  Encounter Vitals Group     BP 07/09/24 1023 116/78     Girls Systolic BP Percentile --      Girls Diastolic BP Percentile --      Boys Systolic BP Percentile --      Boys Diastolic BP Percentile --      Pulse Rate 07/09/24 1023 84     Resp 07/09/24 1023 (!) 22     Temp 07/09/24 1023 98 F (36.7 C)     Temp Source 07/09/24 1023 Oral     SpO2 07/09/24 1023 95 %     Weight --      Height --      Head Circumference --      Peak Flow --      Pain Score 07/09/24 1024 0     Pain Loc --      Pain Education --      Exclude from Growth Chart --    Orthostatic VS for the past 24 hrs:  BP- Lying Pulse- Lying BP- Sitting Pulse- Sitting BP- Standing at 0 minutes Pulse- Standing at 0 minutes  07/09/24 1051 108/70 60 108/70 66 104/69 72    Updated Vital Signs BP 116/78 (BP Location: Right Arm)   Pulse 84   Temp 98 F (36.7 C) (Oral)   Resp (!) 22   SpO2 95%   Visual Acuity Right Eye Distance:   Left Eye Distance:   Bilateral Distance:    Right Eye Near:   Left Eye Near:    Bilateral Near:     Physical Exam Vitals and nursing note reviewed.  Constitutional:      General: She is not in acute distress.    Appearance: Normal appearance.  HENT:     Head: Normocephalic.  Eyes:     Extraocular Movements: Extraocular movements intact.     Conjunctiva/sclera: Conjunctivae normal.     Pupils: Pupils are equal, round, and reactive to light.  Cardiovascular:     Rate and Rhythm: Normal rate and regular rhythm.     Pulses: Normal pulses.     Heart sounds: Normal heart sounds.  Pulmonary:     Effort: Pulmonary effort is normal.     Breath sounds: Normal breath sounds.  Abdominal:     General: Bowel sounds are normal. There is no distension.     Palpations: Abdomen is soft. There is no mass.     Tenderness: There is no abdominal tenderness. There is no guarding or rebound.     Hernia: No hernia is present.  Musculoskeletal:     Cervical  back: Normal range of motion.  Skin:    General: Skin is warm and dry.  Neurological:     General: No focal deficit present.     Mental Status: She is alert and oriented to person, place, and time.  Psychiatric:        Mood and Affect: Mood normal.        Behavior: Behavior normal.      UC Treatments / Results  Labs (all labs ordered are listed, but only abnormal results are displayed) Labs Reviewed  CBC WITH DIFFERENTIAL/PLATELET  COMPREHENSIVE METABOLIC PANEL WITH GFR  POCT URINE DIPSTICK    EKG: Normal sinus rhythm, no STEMI.  Compared to EKGs dated 10/18/2023, 10/16/2023, and 10/09/2023.   Radiology No results found.  Procedures Procedures (including critical care time)  Medications Ordered in UC Medications - No data to display  Initial Impression / Assessment and Plan / UC Course  I have reviewed the triage vital signs and the nursing notes.  Pertinent labs & imaging results that were available during my care of the patient were reviewed by me and considered in my medical decision making (see chart for details).  Patient presents with a several day history of nausea, vomiting, and generalized weakness.  She has not experienced any nausea and vomiting over the past 24 to 48 hours however.  On exam, lung sounds are clear throughout, room air sats are at 95%.  EKG shows normal sinus rhythm, no STEMI.  Urinalysis is negative, and COVID test is negative.  Will collect CBC and BMP for safety.  Last lab work was in September, but no gross abnormalities were seen.  Ondansetron  4 mg ODT provided for nausea.  Supportive care recommendations were provided discussed with the patient to include fluids, rest, over-the-counter Tylenol , and to monitor for worsening symptoms.  Patient was given strict ER follow-up precautions, along with indications to follow-up with her PCP.  Patient was in agreement with this plan of care and verbalizes understanding.  All questions were answered.  Patient  stable for discharge.  Final Clinical Impressions(s) / UC Diagnoses   Final diagnoses:  Generalized weakness  Nausea and vomiting, unspecified vomiting type   Discharge Instructions   None    ED Prescriptions   None    PDMP not reviewed this encounter.   Gilmer Etta PARAS, NP 07/09/24 1307

## 2024-07-09 NOTE — Discharge Instructions (Signed)
 The COVID test was negative.  Lab work is pending.  You will be contacted if the pending test results are abnormal.  You will also have access to the results via MyChart. Take medication as prescribed. Increase your fluid intake.  Recommend Pedialyte or Gatorade to help prevent dehydration. Make sure you are a eating healthy, well-balanced diet. Recommend a BRAT diet until your nausea improves.  This includes bananas, rice, applesauce, and toast. Continue to monitor your symptoms for worsening.  Go to the emergency department if you experience worsening weakness, chest pain, shortness of breath, difficulty breathing, or worsening nausea or vomiting. If your symptoms fail to improve, please follow-up with your primary care physician for further evaluation. Follow-up as needed.

## 2024-07-09 NOTE — ED Triage Notes (Signed)
 Vomiting, nausea and weakness since Wednesday.  States symptoms started 2 days after getting a flu shot.

## 2024-07-10 ENCOUNTER — Other Ambulatory Visit: Payer: Self-pay

## 2024-07-10 ENCOUNTER — Ambulatory Visit: Admitting: Orthopedic Surgery

## 2024-07-10 ENCOUNTER — Ambulatory Visit (HOSPITAL_COMMUNITY): Payer: Self-pay

## 2024-07-10 DIAGNOSIS — S52531D Colles' fracture of right radius, subsequent encounter for closed fracture with routine healing: Secondary | ICD-10-CM

## 2024-07-10 DIAGNOSIS — G8918 Other acute postprocedural pain: Secondary | ICD-10-CM

## 2024-07-10 LAB — CBC WITH DIFFERENTIAL/PLATELET
Basophils Absolute: 0.1 x10E3/uL (ref 0.0–0.2)
Basos: 0 %
EOS (ABSOLUTE): 0.2 x10E3/uL (ref 0.0–0.4)
Eos: 1 %
Hematocrit: 44.2 % (ref 34.0–46.6)
Hemoglobin: 14.2 g/dL (ref 11.1–15.9)
Immature Grans (Abs): 0 x10E3/uL (ref 0.0–0.1)
Immature Granulocytes: 0 %
Lymphocytes Absolute: 2 x10E3/uL (ref 0.7–3.1)
Lymphs: 15 %
MCH: 30.6 pg (ref 26.6–33.0)
MCHC: 32.1 g/dL (ref 31.5–35.7)
MCV: 95 fL (ref 79–97)
Monocytes Absolute: 1.1 x10E3/uL — ABNORMAL HIGH (ref 0.1–0.9)
Monocytes: 8 %
Neutrophils Absolute: 9.9 x10E3/uL — ABNORMAL HIGH (ref 1.4–7.0)
Neutrophils: 76 %
Platelets: 376 x10E3/uL (ref 150–450)
RBC: 4.64 x10E6/uL (ref 3.77–5.28)
RDW: 13.6 % (ref 11.7–15.4)
WBC: 13.2 x10E3/uL — ABNORMAL HIGH (ref 3.4–10.8)

## 2024-07-10 LAB — COMPREHENSIVE METABOLIC PANEL WITH GFR
ALT: 10 IU/L (ref 0–32)
AST: 12 IU/L (ref 0–40)
Albumin: 4.1 g/dL (ref 3.9–4.9)
Alkaline Phosphatase: 93 IU/L (ref 49–135)
BUN/Creatinine Ratio: 13 (ref 12–28)
BUN: 17 mg/dL (ref 8–27)
Bilirubin Total: 0.3 mg/dL (ref 0.0–1.2)
CO2: 20 mmol/L (ref 20–29)
Calcium: 9.1 mg/dL (ref 8.7–10.3)
Chloride: 103 mmol/L (ref 96–106)
Creatinine, Ser: 1.3 mg/dL — ABNORMAL HIGH (ref 0.57–1.00)
Globulin, Total: 2.3 g/dL (ref 1.5–4.5)
Glucose: 86 mg/dL (ref 70–99)
Potassium: 4 mmol/L (ref 3.5–5.2)
Sodium: 140 mmol/L (ref 134–144)
Total Protein: 6.4 g/dL (ref 6.0–8.5)
eGFR: 45 mL/min/1.73 — ABNORMAL LOW (ref >59)

## 2024-07-10 MED ORDER — TRAMADOL HCL 50 MG PO TABS: 50.0000 mg | ORAL_TABLET | Freq: Four times a day (QID) | ORAL | 5 refills | Status: AC | PRN

## 2024-07-10 NOTE — Progress Notes (Signed)
    07/10/2024   Chief Complaint  Patient presents with   Routine Post Op    ORIF RIGHT WRIST DOS 05/15/24    Encounter Diagnosis  Name Primary?   Closed Colles' fracture of right radius with routine healing, subsequent encounter ORIF 05/15/24 Yes    What pharmacy do you use ? ___[facility]________  DOI/DOS/ Date: 05/15/24 ORIF  Did you get better, worse or no change (Answer below)   Unchanged- still hurts -has been sick with bug- patient was given mask

## 2024-07-10 NOTE — Progress Notes (Signed)
     07/10/2024   Chief Complaint  Patient presents with   Routine Post Op    ORIF RIGHT WRIST DOS 05/15/24 has missed a couple therapy appts due to sickness    Encounter Diagnoses  Name Primary?   Closed Colles' fracture of right radius with routine healing, subsequent encounter ORIF 05/15/24 Yes   Post-operative pain     What pharmacy do you use ? ___[facility]________  DOI/DOS/ Date: 05/15/24 ORIF  Did you get better, worse or no change (Answer below)   Unchanged- still hurts -has been sick with bug- patient was given mask  68 year old female status post ORIF right radius for Colles' fracture.  Appears to have some hardware loosening and flexor tendon irritation.  She still cannot make a full fist she has pain over the volar aspect of the forearm.  Recommend hardware removal after patient recovers from what sounds like bronchitis  Come back in 2 weeks for preop

## 2024-07-13 ENCOUNTER — Ambulatory Visit
Admission: EM | Admit: 2024-07-13 | Discharge: 2024-07-13 | Disposition: A | Discharge status: Home / Self Care | Attending: Nurse Practitioner | Admitting: Nurse Practitioner

## 2024-07-13 ENCOUNTER — Ambulatory Visit

## 2024-07-13 DIAGNOSIS — J22 Unspecified acute lower respiratory infection: Secondary | ICD-10-CM

## 2024-07-13 DIAGNOSIS — R0602 Shortness of breath: Secondary | ICD-10-CM | POA: Diagnosis not present

## 2024-07-13 DIAGNOSIS — R110 Nausea: Secondary | ICD-10-CM | POA: Diagnosis not present

## 2024-07-13 DIAGNOSIS — R059 Cough, unspecified: Secondary | ICD-10-CM | POA: Diagnosis not present

## 2024-07-13 LAB — POCT URINE DIPSTICK
Bilirubin, UA: NEGATIVE
Blood, UA: NEGATIVE
Glucose, UA: NEGATIVE mg/dL
Ketones, POC UA: NEGATIVE mg/dL
Leukocytes, UA: NEGATIVE
Nitrite, UA: NEGATIVE
POC PROTEIN,UA: NEGATIVE
Spec Grav, UA: 1.01 (ref 1.010–1.025)
Urobilinogen, UA: 0.2 U/dL
pH, UA: 6 (ref 5.0–8.0)

## 2024-07-13 MED ORDER — METHYLPREDNISOLONE SODIUM SUCC 125 MG IJ SOLR
125.0000 mg | Freq: Once | INTRAMUSCULAR | Status: AC
  Administered 2024-07-13: 125 mg via INTRAMUSCULAR

## 2024-07-13 MED ORDER — AZITHROMYCIN 250 MG PO TABS: 250.0000 mg | ORAL_TABLET | Freq: Every day | ORAL | 0 refills | Status: AC

## 2024-07-13 MED ORDER — ALBUTEROL SULFATE HFA 108 (90 BASE) MCG/ACT IN AERS: 2.0000 | INHALATION_SPRAY | Freq: Four times a day (QID) | RESPIRATORY_TRACT | 0 refills | Status: AC | PRN

## 2024-07-13 MED ORDER — IPRATROPIUM-ALBUTEROL 0.5-2.5 (3) MG/3ML IN SOLN
3.0000 mL | Freq: Once | RESPIRATORY_TRACT | Status: AC
  Administered 2024-07-13: 3 mL via RESPIRATORY_TRACT

## 2024-07-13 MED ORDER — GUAIFENESIN 100 MG/5ML PO LIQD: 10.0000 mL | Freq: Four times a day (QID) | ORAL | 0 refills | Status: AC | PRN

## 2024-07-13 NOTE — ED Triage Notes (Signed)
 Pt being seen in UC for nausea, cough, nasal congestion that began approx 5-6 days ago. Pt denies fevers and vomiting. Pt denies sick contacts. Pt reports taking zofran  about 2 hours ago with no relief.

## 2024-07-13 NOTE — Discharge Instructions (Addendum)
 The chest x-ray and urinalysis were negative. Take medication as prescribed. Increase your fluid intake. You may take over-the-counter Tylenol  as needed for pain, fever, or general discomfort. Recommend the use of a humidifier in your bedroom at nighttime during sleep and sleeping elevated on pillows while symptoms persist. For your continued nausea, I would like for you to follow-up with your primary care physician for further evaluation.  You can continue Zofran  that was previously prescribed. For the cough, recommend use of a humidifier in your bedroom at nighttime during sleep and sleeping elevated on pillows while cough symptoms persist. Highly recommend that you attempt to quit smoking while symptoms persist. Go to the emergency department if you experience shortness of breath, difficulty breathing, chest pain, worsening nausea, vomiting, or other concerns. Follow-up as needed.

## 2024-07-13 NOTE — ED Provider Notes (Signed)
 [facility]    CSN: [id number] Arrival date & time: 07/13/24  1208      History   Chief Complaint Chief Complaint  Patient presents with   Cough   Nausea    HPI [name] is a 68 y.o. female.   The history is provided by the patient.   Patient presents for complaints of cough, shortness of breath, and nausea.  Patient was seen in this clinic on 11 4, and treated for nausea.  Lab work was completed, patient with elevated white blood cell count and kidney function.  Patient endorses shortness of breath when sitting and at rest.  Patient states that she saw her orthopedic doctor, and he told her she had bronchitis.  Patient reports history of smoking, states that she has continued to smoke, and in fact is smoked even this morning.  She continues to complain of nausea, but denies vomiting.  She further denies fever, chills, chest pain, abdominal pain, diarrhea, or constipation.  Patient states that she did take Zofran  approximately 2 hours ago, but has not experienced any relief of symptoms.  Patient states that she has not followed up with her PCP as instructed during her previous visit.  Past Medical History:  Diagnosis Date   Chronic constipation    Cirrhosis (HCC)    child pugh class A and MELD 5, completed hep A and B vaccine series, no HCC on 3/16 MRI   Colonic inertia    Depression    Diverticula of colon    Diverticulosis 01/31/2008   colonoscopy Dr Shaaron   GERD (gastroesophageal reflux disease)    Hyperplastic colon polyp    Liver mass    Macular degeneration    Obesity    PUD (peptic ulcer disease) 1988   Schizoaffective disorder    Mental Health-Dr Dekle   Seizures (HCC)    as child; no medsd and has had no seizures since then.   Wrist fracture    Left wrist s/p fall and surgical repair    Patient Active Problem List   Diagnosis Date Noted   Fracture, Colles, right, closed 05/22/2024   Bradycardia 04/18/2024   Bilateral sensorineural  hearing loss 04/10/2024   Acute hypoxemic respiratory failure (HCC) 10/16/2023   History of thromboembolism 03/02/2021   Tobacco dependence due to cigarettes 03/02/2021   Lung nodule 12/29/2020   Diarrhea 09/17/2020   Acute deep vein thrombosis (DVT) of right lower extremity (HCC) 09/08/2020   Venous embolism 09/08/2020   Cough 03/24/2020   Dyspnea 03/24/2020   Insomnia 03/24/2020   Encounter for screening colonoscopy 09/20/2017   GERD (gastroesophageal reflux disease) 09/15/2016   Mucosal abnormality of stomach    Nausea with vomiting 11/04/2015   Abdominal pain, epigastric 09/17/2015   Peritonitis (HCC) 08/27/2015   Pain following surgery or procedure 08/22/2015   Abdominal pain 08/01/2015   Nausea 08/01/2015   Abdominal mass 08/01/2015   Schizoaffective disorder (HCC) 05/15/2014   Hepatic cirrhosis (HCC) 10/18/2013   LLQ pain 08/13/2013   DEPRESSION 04/08/2008   PUD 04/08/2008   DIVERTICULOSIS, COLON 04/08/2008   Constipation 04/08/2008   MORBID OBESITY, HX OF 04/08/2008    Past Surgical History:  Procedure Laterality Date   ABDOMINAL HYSTERECTOMY     CHOLECYSTECTOMY  08/2010   cholelithiasis; Dr Mavis   COLONOSCOPY  01/31/2008   Dr. Shaaron- normal rectum, L sided diverticula, long tortuous colon. hyperplasitic  polyp.   COLONOSCOPY WITH PROPOFOL  N/A 10/19/2017    single 4 mm polyp  in the sigmoid colon which was sessile, scattered small mouth diverticula in the sigmoid and descending colon, otherwise normal.  Surgical pathology found the polyp to be hyperplastic.  Recommended 10-year repeat colonoscopy.   ERCP W/ SPHINCTEROTOMY AND BALLOON DILATION  08/05/2010   normal ampulla/mild erythema in the gastric remnant without ulcerations or erosions. normal retroflexed view of the cardia/anastomosis normal/distal common bile duct stones s/p extraction and shincterotomy   ESOPHAGOGASTRODUODENOSCOPY N/A 10/24/2013   MFM:Wnmfjo esophagus. Surgically altered stomach. Abnormal  gastric mucosa  -  status post biopsy (reactive gastrophathy)   ESOPHAGOGASTRODUODENOSCOPY (EGD) WITH PROPOFOL  N/A 11/30/2015   LA grade A esophagitis, no varices, surveillance in 2019   ESOPHAGOGASTRODUODENOSCOPY (EGD) WITH PROPOFOL  N/A 10/19/2017   erosive reflux esophagitis status post dilation, surgically altered stomach status post gastric bypass with a single patent apparent small bowel limb, small hiatal hernia.   ESOPHAGOGASTRODUODENOSCOPY (EGD) WITH PROPOFOL  N/A 07/16/2021   normal esophagus, no varices, portal gastropathy. 3 year screening   EUS N/A 08/20/2015   Dr. Teressa: well-circumscribed 5.2 cm chronic mass in porta hepatis, internally mass is gelatinous. Small distal esophageal varices (limited view), Bilroth 1 type anatomy, cytology NEGATIVE for malignant cells. Suspicion for neoplasm low    FOOT SURGERY     left   GASTRIC BYPASS     with Revision, Dr. Darien STAI DILATION N/A 10/19/2017   Procedure: STAI DILATION;  Surgeon: Shaaron Lamar HERO, MD;  Location: [facility];  Service: Endoscopy;  Laterality: N/A;   OPEN REDUCTION INTERNAL FIXATION (ORIF) DISTAL RADIAL FRACTURE Right 05/15/2024   Procedure: OPEN REDUCTION INTERNAL FIXATION (ORIF) DISTAL RADIUS FRACTURE;  Surgeon: Margrette Taft BRAVO, MD;  Location: [facility];  Service: Orthopedics;  Laterality: Right;   POLYPECTOMY  10/19/2017   Procedure: POLYPECTOMY;  Surgeon: Shaaron Lamar HERO, MD;  Location: [facility];  Service: Endoscopy;;  sigmoid colon (CS)    REDUCTION MAMMAPLASTY Bilateral    S/P Hysterectomy     partial   TONSILLECTOMY     WRIST SURGERY Left 05/2016    OB History   No obstetric history on file.      Home Medications    Prior to Admission medications   Medication Sig Start Date End Date Taking? Authorizing Provider  albuterol  (VENTOLIN  HFA) 108 (90 Base) MCG/ACT inhaler Inhale 2 puffs into the lungs every 6 (six) hours as needed. 07/13/24  Yes Leath-Warren, Etta PARAS, NP   azithromycin (ZITHROMAX) 250 MG tablet Take 1 tablet (250 mg total) by mouth daily. Take first 2 tablets together, then 1 every day until finished. 07/13/24  Yes Leath-Warren, Etta PARAS, NP  guaiFENesin  (ROBITUSSIN) 100 MG/5ML liquid Take 10 mLs by mouth every 6 (six) hours as needed for cough or to loosen phlegm. 07/13/24  Yes Leath-Warren, Etta PARAS, NP  FLUoxetine  (PROZAC ) 40 MG capsule Take 40 mg by mouth in the morning. 06/06/12   [provider]  ibuprofen  (ADVIL ) 800 MG tablet Take 1 tablet (800 mg total) by mouth every 8 (eight) hours as needed. Patient not taking: Reported on 07/13/2024 05/23/24   Margrette Taft BRAVO, MD  linaclotide  (LINZESS ) 290 MCG CAPS capsule Take 1 capsule (290 mcg total) by mouth daily before breakfast. 12/14/23   Shirlean Therisa ORN, NP  methocarbamol  (ROBAXIN ) 500 MG tablet Take 500 mg by mouth 4 (four) times daily.    [provider]  ondansetron  (ZOFRAN ) 4 MG tablet Take 1 tablet (4 mg total) by mouth every 8 (eight) hours as needed for nausea  or vomiting. 06/15/23   Shirlean Therisa ORN, NP  ondansetron  (ZOFRAN ) 8 MG tablet Take 1 tablet (8 mg total) by mouth every 8 (eight) hours as needed for nausea or vomiting. 12/14/23   Shirlean Therisa ORN, NP  ondansetron  (ZOFRAN -ODT) 4 MG disintegrating tablet Take 1 tablet (4 mg total) by mouth every 8 (eight) hours as needed. 07/09/24   Leath-Warren, Etta PARAS, NP  pantoprazole  (PROTONIX ) 40 MG tablet TAKE 1 TABLET BY MOUTH DAILY 02/05/24   Shirlean Therisa ORN, NP  promethazine  (PHENERGAN ) 12.5 MG tablet Take 1 tablet (12.5 mg total) by mouth every 6 (six) hours as needed for nausea or vomiting. Patient not taking: Reported on 07/13/2024 05/15/24   Margrette Taft BRAVO, MD  risperiDONE  (RISPERDAL ) 3 MG tablet Take 3 mg by mouth at bedtime.     [provider]  SYMBICORT 160-4.5 MCG/ACT inhaler Inhale 2 puffs into the lungs daily. 02/07/24   [provider]  traMADol (ULTRAM) 50 MG tablet Take 1 tablet (50 mg total) by  mouth every 6 (six) hours as needed. Patient not taking: Reported on 07/13/2024 07/10/24   Margrette Taft BRAVO, MD  traZODone  (DESYREL ) 150 MG tablet Take 300 mg by mouth at bedtime. 07/20/15   [provider]    Family History Family History  Problem Relation Age of Onset   Diabetes Father    Colon cancer Maternal Uncle    Liver cancer Maternal Uncle        27 years old   Liver disease Neg Hx     Social History Social History   Tobacco Use   Smoking status: Every Day    Current packs/day: 1.00    Average packs/day: 1 pack/day for 36.0 years (36.0 ttl pk-yrs)    Types: Cigarettes   Smokeless tobacco: Never  Vaping Use   Vaping status: Never Used  Substance Use Topics   Alcohol use: No    Alcohol/week: 0.0 standard drinks of alcohol   Drug use: Yes    Types: Marijuana    Comment: 3 joints/day     Allergies   Ciprofloxacin and Kiwi extract   Review of Systems Review of Systems Per HPI  Physical Exam Triage Vital Signs ED Triage Vitals  Encounter Vitals Group     BP 07/13/24 1222 (!) 94/58     Girls Systolic BP Percentile --      Girls Diastolic BP Percentile --      Boys Systolic BP Percentile --      Boys Diastolic BP Percentile --      Pulse Rate 07/13/24 1222 77     Resp 07/13/24 1222 20     Temp 07/13/24 1222 98.2 F (36.8 C)     Temp Source 07/13/24 1222 Oral     SpO2 07/13/24 1222 95 %     Weight --      Height --      Head Circumference --      Peak Flow --      Pain Score 07/13/24 1219 8     Pain Loc --      Pain Education --      Exclude from Growth Chart --    Orthostatic VS for the past 24 hrs:  BP- Lying Pulse- Lying BP- Sitting Pulse- Sitting BP- Standing at 0 minutes Pulse- Standing at 0 minutes  07/13/24 1349 112/72 64 100/68 67 101/68 67    Updated Vital Signs BP (!) 94/58 (BP Location: Left Arm)   Pulse 77  Temp 98.2 F (36.8 C) (Oral)   Resp 20   SpO2 95%   Visual Acuity Right Eye Distance:   Left Eye Distance:    Bilateral Distance:    Right Eye Near:   Left Eye Near:    Bilateral Near:     Physical Exam Vitals and nursing note reviewed.  Constitutional:      General: She is not in acute distress.    Appearance: Normal appearance.  HENT:     Head: Normocephalic.     Right Ear: Tympanic membrane, ear canal and external ear normal.     Left Ear: Tympanic membrane, ear canal and external ear normal.     Nose: Nose normal.     Mouth/Throat:     Mouth: Mucous membranes are moist.  Eyes:     Extraocular Movements: Extraocular movements intact.     Conjunctiva/sclera: Conjunctivae normal.     Pupils: Pupils are equal, round, and reactive to light.  Cardiovascular:     Rate and Rhythm: Normal rate and regular rhythm.     Pulses: Normal pulses.     Heart sounds: Normal heart sounds.  Pulmonary:     Effort: Pulmonary effort is normal. No respiratory distress.     Breath sounds: Normal breath sounds. No stridor. No wheezing, rhonchi or rales.  Abdominal:     General: Bowel sounds are normal.     Palpations: Abdomen is soft.     Tenderness: There is no abdominal tenderness.  Musculoskeletal:     Cervical back: Normal range of motion.  Skin:    General: Skin is warm and dry.  Neurological:     General: No focal deficit present.     Mental Status: She is alert and oriented to person, place, and time.  Psychiatric:        Mood and Affect: Mood normal.        Behavior: Behavior normal.      UC Treatments / Results  Labs (all labs ordered are listed, but only abnormal results are displayed) Labs Reviewed  POCT URINE DIPSTICK    EKG   Radiology DG Chest 2 View Result Date: 07/13/2024 EXAM: 2 VIEW(S) XRAY OF THE CHEST 07/13/2024 01:00:17 PM COMPARISON: 11/09/2023 CLINICAL HISTORY: shortness of breath x 5-6 days FINDINGS: LUNGS AND PLEURA: No focal pulmonary opacity. No pulmonary edema. No pleural effusion. No pneumothorax. HEART AND MEDIASTINUM: Atherosclerotic calcifications of  aortic arch. No acute abnormality of the cardiac and mediastinal silhouettes. BONES AND SOFT TISSUES: Surgical clips in left upper quadrant. Mild degenerative changes in thoracic spine. Old right 7th rib fracture. No acute osseous abnormality. IMPRESSION: 1. No acute cardiopulmonary process identified. Electronically signed by: Waddell Calk MD 07/13/2024 01:22 PM EST RP Workstation: [id number]    Procedures Procedures (including critical care time)  Medications Ordered in UC Medications  ipratropium-albuterol  (DUONEB) 0.5-2.5 (3) MG/3ML nebulizer solution 3 mL (3 mLs Nebulization Given 07/13/24 1258)  methylPREDNISolone  sodium succinate (SOLU-MEDROL ) 125 mg/2 mL injection 125 mg (125 mg Intramuscular Given 07/13/24 1258)    Initial Impression / Assessment and Plan / UC Course  I have reviewed the triage vital signs and the nursing notes.  Pertinent labs & imaging results that were available during my care of the patient were reviewed by me and considered in my medical decision making (see chart for details).  Urinalysis is negative, chest x-ray is also negative.  On exam, the patient's lung sounds are clear throughout, baseline O2 sats at 95%.  Patient complains of  persistent cough and ongoing nausea.  No signs of dehydration were noted on her exam today.  Will treat patient for ongoing chronic bronchitis with azithromycin 250 mg.  DuoNeb and Solu-Medrol  125 mg was administered in the clinic.  Will also provide the patient an albuterol  inhaler for wheezing or shortness of breath.  Patient was given strict ER follow-up precautions to include chest pain, worsening nausea, difficulty breathing, or other concerns.  Patient was advised it is highly recommended that she follow-up with her primary care physician for reevaluation within the next 7 to 10 days.  Patient was in agreement with this plan of care and verbalizes understanding.  All questions were answered.  Patient stable for discharge.   Final  Clinical Impressions(s) / UC Diagnoses   Final diagnoses:  Nausea without vomiting  Shortness of breath  Cough, unspecified type     Discharge Instructions      The chest x-ray and urinalysis were negative. Take medication as prescribed. Increase your fluid intake. You may take over-the-counter Tylenol  as needed for pain, fever, or general discomfort. Recommend the use of a humidifier in your bedroom at nighttime during sleep and sleeping elevated on pillows while symptoms persist. For your continued nausea, I would like for you to follow-up with your primary care physician for further evaluation.  You can continue Zofran  that was previously prescribed. For the cough, recommend use of a humidifier in your bedroom at nighttime during sleep and sleeping elevated on pillows while cough symptoms persist. Highly recommend that you attempt to quit smoking while symptoms persist. Go to the emergency department if you experience shortness of breath, difficulty breathing, chest pain, worsening nausea, vomiting, or other concerns. Follow-up as needed.     ED Prescriptions     Medication Sig Dispense Auth. Provider   azithromycin (ZITHROMAX) 250 MG tablet Take 1 tablet (250 mg total) by mouth daily. Take first 2 tablets together, then 1 every day until finished. 6 tablet Leath-Warren, Etta PARAS, NP   albuterol  (VENTOLIN  HFA) 108 (90 Base) MCG/ACT inhaler Inhale 2 puffs into the lungs every 6 (six) hours as needed. 8 g Leath-Warren, Etta PARAS, NP   guaiFENesin  (ROBITUSSIN) 100 MG/5ML liquid Take 10 mLs by mouth every 6 (six) hours as needed for cough or to loosen phlegm. 300 mL Leath-Warren, Etta PARAS, NP      PDMP not reviewed this encounter.   Gilmer Etta PARAS, NP 07/13/24 1355

## 2024-07-16 ENCOUNTER — Telehealth (HOSPITAL_COMMUNITY): Payer: Self-pay | Admitting: Occupational Therapy

## 2024-07-16 ENCOUNTER — Ambulatory Visit (HOSPITAL_COMMUNITY): Admitting: Occupational Therapy

## 2024-07-16 NOTE — Telephone Encounter (Signed)
 No-show #3  Attempted to contact pt regarding no-show for OT appointment on 07/16/24. No answer, phone rings continually with no voicemail option. Pt has not been seen in clinic since 06/27/24. Pt will be placed on no-show policy and informed at next appointment on 07/18/24. Pt will only be able to schedule one appointment at a time going forward, all remaining appts have been canceled with exception on 07/18/24.    If she is a no-show for 11/13 appointment, pt will be discharged from OT services.    Sonny Cory, OTR/L  [phone] 07/16/24

## 2024-07-18 ENCOUNTER — Ambulatory Visit (HOSPITAL_COMMUNITY): Admitting: Occupational Therapy

## 2024-07-19 ENCOUNTER — Encounter (HOSPITAL_COMMUNITY): Payer: Self-pay | Admitting: Occupational Therapy

## 2024-07-19 NOTE — Therapy (Signed)
 [facility] at [facility] [address] [place], [place], [address] Phone: [phone]   Fax:  [phone]  Patient Details  Name: [name] MRN: [id number] Date of Birth: 09-17-1955 Referring Provider:  No ref. provider found  Encounter Date: 07/19/2024  OCCUPATIONAL THERAPY DISCHARGE SUMMARY  Visits from Start of Care: 3  Current functional level related to goals / functional outcomes: Unknown. Pt has been a no show for 4 appointments with 1 additional cancellation, has not been seen since 06/27/24. Unable to leave voicemail due to phone ringing with no voicemail option. Pt will need a new referral to return for OT services.    Remaining deficits: Same   Education / Equipment: HEP at evaluation   Patient agrees to discharge. Patient goals were not met. Patient is being discharged due to not returning since the last visit.SABRA      Sonny Cory, OTR/L  [phone] 07/19/2024, 1:18 PM  [facility] at [facility] [address] [place], [place], [address] Phone: [phone]   Fax:  [phone]

## 2024-07-23 ENCOUNTER — Ambulatory Visit (HOSPITAL_COMMUNITY): Admitting: Occupational Therapy

## 2024-07-24 ENCOUNTER — Ambulatory Visit: Admitting: Orthopedic Surgery

## 2024-07-24 VITALS — Ht 64.0 in | Wt 205.0 lb

## 2024-07-24 DIAGNOSIS — T148XXA Other injury of unspecified body region, initial encounter: Secondary | ICD-10-CM

## 2024-07-24 DIAGNOSIS — S52531D Colles' fracture of right radius, subsequent encounter for closed fracture with routine healing: Secondary | ICD-10-CM

## 2024-07-24 MED ORDER — HYDROCODONE-ACETAMINOPHEN 5-325 MG PO TABS: 1.0000 | ORAL_TABLET | Freq: Four times a day (QID) | ORAL | 0 refills | Status: AC | PRN

## 2024-07-24 NOTE — Progress Notes (Signed)
    07/24/2024   Chief Complaint  Patient presents with   Wrist Pain    Right / ORIF 05/15/24 discuss hardware removal     Encounter Diagnosis  Name Primary?   Closed Colles' fracture of right radius with routine healing, subsequent encounter ORIF 05/15/24 Yes    What pharmacy do you use ? _____[facility]______________________  DOI/DOS/ Date: 05/15/24  Did you get better, worse or no change (Answer below)   Unchanged   States she is still not feeling well with the respiratory illness

## 2024-07-24 NOTE — Progress Notes (Signed)
     Office Visit Note   Patient: ANNISA MAZZARELLA           Date of Birth: 09/15/1955           MRN: [id number] Visit Date: 07/24/2024 Requested by: No referring provider defined for this encounter. PCP: Vick Lurie, FNP (Inactive)   Assessment & Plan:   Encounter Diagnoses  Name Primary?   Closed Colles' fracture of right radius with routine healing, subsequent encounter ORIF 05/15/24    Pain due to fracture Yes    Meds ordered this encounter  Medications   HYDROcodone -acetaminophen  (NORCO/VICODIN) 5-325 MG tablet    Sig: Take 1 tablet by mouth every 6 (six) hours as needed for moderate pain (pain score 4-6).    Dispense:  30 tablet    Refill:  0   Assessment and plan  68 year old female still having symptoms of bronchitis.  The patient will need to see primary care and be cleared of the bronchitis prior to surgical intervention.  In the meantime she will require pain medication because the tramadol is not helping.  She will need to have the hardware removed.  She will make an appointment once the bronchitis has cleared  Chief complaint Chief Complaint  Patient presents with   Wrist Pain    Right / ORIF 05/15/24 discuss hardware removal    History 68 year old female status post open reduction internal fixation distal radius fracture right wrist on May 15, 2024  Patient's postop films show some of the hardware the screws are loosening.  I think the fracture is healed enough for us  to remove the plate due to symptomatic hardware.  We did try tramadol for pain it did not help  She developed bronchitis and she has gone to urgent care to get an steroid injection and start antibiotics with no relief of the bronchitis    Meds ordered this encounter  Medications   HYDROcodone -acetaminophen  (NORCO/VICODIN) 5-325 MG tablet    Sig: Take 1 tablet by mouth every 6 (six) hours as needed for moderate pain (pain score 4-6).    Dispense:  30 tablet    Refill:  0

## 2024-07-25 ENCOUNTER — Ambulatory Visit (HOSPITAL_COMMUNITY): Admitting: Occupational Therapy

## 2024-07-30 ENCOUNTER — Ambulatory Visit (HOSPITAL_COMMUNITY): Admitting: Occupational Therapy

## 2024-08-05 ENCOUNTER — Ambulatory Visit (HOSPITAL_COMMUNITY): Admitting: Occupational Therapy

## 2024-08-07 ENCOUNTER — Ambulatory Visit (HOSPITAL_COMMUNITY): Admitting: Occupational Therapy

## 2024-08-13 ENCOUNTER — Ambulatory Visit (HOSPITAL_COMMUNITY): Admitting: Occupational Therapy

## 2024-08-15 ENCOUNTER — Ambulatory Visit (HOSPITAL_COMMUNITY): Admitting: Occupational Therapy
